# Patient Record
Sex: Male | Born: 1945 | Race: Black or African American | Hispanic: No | Smoking: Never smoker
Health system: Southern US, Community
[De-identification: ages and names within clinical notes are randomized; demographics above are authoritative.]

## PROBLEM LIST (undated history)

## (undated) DIAGNOSIS — D122 Benign neoplasm of ascending colon: Secondary | ICD-10-CM

## (undated) DIAGNOSIS — I10 Essential (primary) hypertension: Secondary | ICD-10-CM

## (undated) DIAGNOSIS — R32 Unspecified urinary incontinence: Secondary | ICD-10-CM

## (undated) DIAGNOSIS — E785 Hyperlipidemia, unspecified: Secondary | ICD-10-CM

## (undated) DIAGNOSIS — C801 Malignant (primary) neoplasm, unspecified: Secondary | ICD-10-CM

## (undated) DIAGNOSIS — D120 Benign neoplasm of cecum: Secondary | ICD-10-CM

## (undated) DIAGNOSIS — I2510 Atherosclerotic heart disease of native coronary artery without angina pectoris: Secondary | ICD-10-CM

## (undated) DIAGNOSIS — F0390 Unspecified dementia without behavioral disturbance: Secondary | ICD-10-CM

## (undated) HISTORY — DX: Unspecified urinary incontinence: R32

## (undated) HISTORY — DX: Hyperlipidemia, unspecified: E78.5

---

## 2014-02-20 ENCOUNTER — Emergency Department: Payer: Self-pay | Admitting: Internal Medicine

## 2015-11-06 ENCOUNTER — Encounter: Payer: Self-pay | Admitting: Unknown Physician Specialty

## 2017-03-22 DIAGNOSIS — J069 Acute upper respiratory infection, unspecified: Secondary | ICD-10-CM | POA: Diagnosis not present

## 2017-04-20 DIAGNOSIS — H1031 Unspecified acute conjunctivitis, right eye: Secondary | ICD-10-CM | POA: Diagnosis not present

## 2017-10-10 ENCOUNTER — Ambulatory Visit (INDEPENDENT_AMBULATORY_CARE_PROVIDER_SITE_OTHER): Payer: Medicare HMO | Admitting: Unknown Physician Specialty

## 2017-10-10 ENCOUNTER — Encounter: Payer: Self-pay | Admitting: Unknown Physician Specialty

## 2017-10-10 VITALS — BP 129/87 | HR 88 | Temp 98.5°F | Ht 68.8 in | Wt 146.4 lb

## 2017-10-10 DIAGNOSIS — I10 Essential (primary) hypertension: Secondary | ICD-10-CM

## 2017-10-10 DIAGNOSIS — Z23 Encounter for immunization: Secondary | ICD-10-CM | POA: Diagnosis not present

## 2017-10-10 DIAGNOSIS — H6123 Impacted cerumen, bilateral: Secondary | ICD-10-CM

## 2017-10-10 DIAGNOSIS — Z7689 Persons encountering health services in other specified circumstances: Secondary | ICD-10-CM | POA: Diagnosis not present

## 2017-10-10 LAB — UA/M W/RFLX CULTURE, ROUTINE
Bilirubin, UA: NEGATIVE
Glucose, UA: NEGATIVE
Ketones, UA: NEGATIVE
LEUKOCYTES UA: NEGATIVE
Nitrite, UA: NEGATIVE
PH UA: 5.5 (ref 5.0–7.5)
RBC UA: NEGATIVE
Specific Gravity, UA: >1.03 — ABNORMAL HIGH (ref 1.005–1.030)
Urobilinogen, Ur: 0.2 mg/dL (ref 0.2–1.0)

## 2017-10-10 LAB — MICROSCOPIC EXAMINATION: BACTERIA UA: NONE SEEN

## 2017-10-10 MED ORDER — AMLODIPINE BESYLATE 5 MG PO TABS: 5.0000 mg | ORAL_TABLET | Freq: Every day | ORAL | 3 refills | Status: DC

## 2017-10-10 NOTE — Progress Notes (Signed)
BP 129/87 (BP Location: Left Arm, Cuff Size: Normal)   Pulse 88   Temp 98.5 F (36.9 C) (Oral)   Ht 5' 8.8" (1.748 m)   Wt 146 lb 6.4 oz (66.4 kg)   SpO2 98%   BMI 21.75 kg/m    Subjective:    Patient ID: Ronnie Bullock, male    DOB: Feb 11, 1946, 71 y.o.   MRN: [id number]  HPI: Ronnie Bullock is a 71 y.o. male  Chief Complaint  Patient presents with  . Establish Care    pt states he is interested in Hep C order  . Ear Fullness    pt states his ears feel full, has not done any pre-treatment for impacted wax   Hypertension On no medications, though pt thinks he's had high BP for about 5 years.   Average home BPs: Not chekcing   No problems or lightheadedness No chest pain with exertion or shortness of breath No Edema Mother with hypertension  Depression screen [facility] 2/9 10/10/2017  Decreased Interest 0  Down, Depressed, Hopeless 0  PHQ - 2 Score 0  Altered sleeping 0  Tired, decreased energy 0  Change in appetite 0  Feeling bad or failure about yourself  0  Trouble concentrating 0  Moving slowly or fidgety/restless 0  Suicidal thoughts 0  PHQ-9 Score 0   Social History   Socioeconomic History  . Marital status: Widowed    Spouse name: Not on file  . Number of children: Not on file  . Years of education: Not on file  . Highest education level: Not on file  Social Needs  . Financial resource strain: Not on file  . Food insecurity - worry: Not on file  . Food insecurity - inability: Not on file  . Transportation needs - medical: Not on file  . Transportation needs - non-medical: Not on file  Occupational History  . Not on file  Tobacco Use  . Smoking status: Never Smoker  . Smokeless tobacco: Never Used  Substance and Sexual Activity  . Alcohol use: No    Alcohol/week: 0.0 oz    Frequency: Never  . Drug use: No  . Sexual activity: Yes  Other Topics Concern  . Not on file  Social History Narrative  . Not on file   Family History  Problem  Relation Age of Onset  . Hypertension Mother    History reviewed. No pertinent past medical history.  History reviewed. No pertinent surgical history.    Relevant past medical, surgical, family and social history reviewed and updated as indicated. Interim medical history since our last visit reviewed. Allergies and medications reviewed and updated.  Review of Systems  Constitutional: Negative.   HENT:       "ears are stopped up"  Eyes: Negative.   Respiratory: Negative.   Cardiovascular: Negative.   Gastrointestinal: Negative.   Endocrine: Negative.   Genitourinary: Negative.   Musculoskeletal: Negative.   Allergic/Immunologic: Negative.   Neurological: Negative.   Psychiatric/Behavioral: Negative.     Per HPI unless specifically indicated above     Objective:    BP 129/87 (BP Location: Left Arm, Cuff Size: Normal)   Pulse 88   Temp 98.5 F (36.9 C) (Oral)   Ht 5' 8.8" (1.748 m)   Wt 146 lb 6.4 oz (66.4 kg)   SpO2 98%   BMI 21.75 kg/m   Wt Readings from Last 3 Encounters:  10/10/17 146 lb 6.4 oz (66.4 kg)  04/29/14 147 lb (  66.7 kg)    Physical Exam  Constitutional: He is oriented to person, place, and time. He appears well-developed and well-nourished. No distress.  HENT:  Head: Normocephalic and atraumatic.  Bilateral ears impacted with ear wax.  Successfully irrigated right ear.  Left ear unsuccessful.  Pt ed on OTC ear wax removal  Eyes: Conjunctivae and lids are normal. Right eye exhibits no discharge. Left eye exhibits no discharge. No scleral icterus.  Neck: Normal range of motion. Neck supple. No JVD present. Carotid bruit is not present.  Cardiovascular: Normal rate, regular rhythm and normal heart sounds.  Pulmonary/Chest: Effort normal and breath sounds normal. No respiratory distress.  Abdominal: Normal appearance. There is no splenomegaly or hepatomegaly.  Musculoskeletal: Normal range of motion.  Neurological: He is alert and oriented to person,  place, and time.  Skin: Skin is warm, dry and intact. No rash noted. No pallor.  Psychiatric: He has a normal mood and affect. His behavior is normal. Judgment and thought content normal.    No results found for this or any previous visit.    Assessment & Plan:   Problem List Items Addressed This Visit      Unprioritized   Encounter to establish care   Essential hypertension, benign    High today and pt shares it's been high for some time.  Will get labs and start Amlodipine 5 mg.  Recheck in 1 month      Relevant Medications   amLODipine (NORVASC) 5 MG tablet   Other Relevant Orders   Comprehensive metabolic panel   Lipid Panel w/o Chol/HDL Ratio   TSH   UA/M w/rflx Culture, Routine    Other Visit Diagnoses    Need for influenza vaccination    -  Primary   Relevant Orders   Flu vaccine HIGH DOSE PF (Completed)   Need for pneumococcal vaccination       Relevant Orders   Pneumococcal conjugate vaccine 13-valent IM (Completed)   Bilateral impacted cerumen       See above.  Pt ed on OTC ear wax removal       Follow up plan: Return in about 4 weeks (around 11/07/2017) for plus needs Medicare wellness.

## 2017-10-10 NOTE — Assessment & Plan Note (Signed)
High today and pt shares it's been high for some time.  Will get labs and start Amlodipine 5 mg.  Recheck in 1 month

## 2017-10-10 NOTE — Patient Instructions (Addendum)
Influenza (Flu) Vaccine (Inactivated or Recombinant): What You Need to Know 1. Why get vaccinated? Influenza ("flu") is a contagious disease that spreads around the [place] every year, usually between October and May. Flu is caused by influenza viruses, and is spread mainly by coughing, sneezing, and close contact. Anyone can get flu. Flu strikes suddenly and can last several days. Symptoms vary by age, but can include:  fever/chills  sore throat  muscle aches  fatigue  cough  headache  runny or stuffy nose  Flu can also lead to pneumonia and blood infections, and cause diarrhea and seizures in children. If you have a medical condition, such as heart or lung disease, flu can make it worse. Flu is more dangerous for some people. Infants and young children, people 23 years of age and older, pregnant women, and people with certain health conditions or a weakened immune system are at greatest risk. Each year thousands of people in the [place] die from flu, and many more are hospitalized. Flu vaccine can:  keep you from getting flu,  make flu less severe if you do get it, and  keep you from spreading flu to your family and other people. 2. Inactivated and recombinant flu vaccines A dose of flu vaccine is recommended every flu season. Children 6 months through 91 years of age may need two doses during the same flu season. Everyone else needs only one dose each flu season. Some inactivated flu vaccines contain a very small amount of a mercury-based preservative called thimerosal. Studies have not shown thimerosal in vaccines to be harmful, but flu vaccines that do not contain thimerosal are available. There is no live flu virus in flu shots. They cannot cause the flu. There are many flu viruses, and they are always changing. Each year a new flu vaccine is made to protect against three or four viruses that are likely to cause disease in the upcoming flu season. But even when the  vaccine doesn't exactly match these viruses, it may still provide some protection. Flu vaccine cannot prevent:  flu that is caused by a virus not covered by the vaccine, or  illnesses that look like flu but are not.  It takes about 2 weeks for protection to develop after vaccination, and protection lasts through the flu season. 3. Some people should not get this vaccine Tell the person who is giving you the vaccine:  If you have any severe, life-threatening allergies. If you ever had a life-threatening allergic reaction after a dose of flu vaccine, or have a severe allergy to any part of this vaccine, you may be advised not to get vaccinated. Most, but not all, types of flu vaccine contain a small amount of egg protein.  If you ever had Guillain-Barr Syndrome (also called GBS). Some people with a history of GBS should not get this vaccine. This should be discussed with your doctor.  If you are not feeling well. It is usually okay to get flu vaccine when you have a mild illness, but you might be asked to come back when you feel better.  4. Risks of a vaccine reaction With any medicine, including vaccines, there is a chance of reactions. These are usually mild and go away on their own, but serious reactions are also possible. Most people who get a flu shot do not have any problems with it. Minor problems following a flu shot include:  soreness, redness, or swelling where the shot was given  hoarseness  sore,  red or itchy eyes  cough  fever  aches  headache  itching  fatigue  If these problems occur, they usually begin soon after the shot and last 1 or 2 days. More serious problems following a flu shot can include the following:  There may be a small increased risk of Guillain-Barre Syndrome (GBS) after inactivated flu vaccine. This risk has been estimated at 1 or 2 additional cases per million people vaccinated. This is much lower than the risk of severe complications from  flu, which can be prevented by flu vaccine.  Young children who get the flu shot along with pneumococcal vaccine (PCV13) and/or DTaP vaccine at the same time might be slightly more likely to have a seizure caused by fever. Ask your doctor for more information. Tell your doctor if a child who is getting flu vaccine has ever had a seizure.  Problems that could happen after any injected vaccine:  People sometimes faint after a medical procedure, including vaccination. Sitting or lying down for about 15 minutes can help prevent fainting, and injuries caused by a fall. Tell your doctor if you feel dizzy, or have vision changes or ringing in the ears.  Some people get severe pain in the shoulder and have difficulty moving the arm where a shot was given. This happens very rarely.  Any medication can cause a severe allergic reaction. Such reactions from a vaccine are very rare, estimated at about 1 in a million doses, and would happen within a few minutes to a few hours after the vaccination. As with any medicine, there is a very remote chance of a vaccine causing a serious injury or death. The safety of vaccines is always being monitored. For more information, visit: http://www.aguilar.org/ 5. What if there is a serious reaction? What should I look for? Look for anything that concerns you, such as signs of a severe allergic reaction, very high fever, or unusual behavior. Signs of a severe allergic reaction can include hives, swelling of the face and throat, difficulty breathing, a fast heartbeat, dizziness, and weakness. These would start a few minutes to a few hours after the vaccination. What should I do?  If you think it is a severe allergic reaction or other emergency that can't wait, call 9-1-1 and get the person to the nearest hospital. Otherwise, call your doctor.  Reactions should be reported to the Vaccine Adverse Event Reporting System (VAERS). Your doctor should file this report, or you  can do it yourself through the VAERS web site at www.vaers.SamedayNews.es, or by calling 6094730752. ? VAERS does not give medical advice. 6. The National Vaccine Injury Compensation Program The [employer] Vaccine Injury Compensation Program (VICP) is a federal program that was created to compensate people who may have been injured by certain vaccines. Persons who believe they may have been injured by a vaccine can learn about the program and about filing a claim by calling 458-267-6070 or visiting the [place] website at GoldCloset.com.ee. There is a time limit to file a claim for compensation. 7. How can I learn more?  Ask your healthcare provider. He or she can give you the vaccine package insert or suggest other sources of information.  Call your local or state health department.  Contact the Centers for Disease Control and Prevention (CDC): ? Call (540)164-9661 (1-800-CDC-INFO) or ? Visit CDC's website at https://gibson.com/ Vaccine Information Statement, Inactivated Influenza Vaccine (06/13/2014) This information is not intended to replace advice given to you by your health care provider. Make sure  you discuss any questions you have with your health care provider. Document Released: 08/18/2006 Document Revised: 07/14/2016 Document Reviewed: 07/14/2016 Elsevier Interactive Patient Education  2017 Elsevier Inc. Pneumococcal Conjugate Vaccine (PCV13) What You Need to Know 1. Why get vaccinated? Vaccination can protect both children and adults from pneumococcal disease. Pneumococcal disease is caused by bacteria that can spread from person to person through close contact. It can cause ear infections, and it can also lead to more serious infections of the:  Lungs (pneumonia),  Blood (bacteremia), and  Covering of the brain and spinal cord (meningitis).  Pneumococcal pneumonia is most common among adults. Pneumococcal meningitis can cause deafness and brain damage, and it kills  about 1 child in 10 who get it. Anyone can get pneumococcal disease, but children under 74 years of age and adults 55 years and older, people with certain medical conditions, and cigarette smokers are at the highest risk. Before there was a vaccine, the [place] saw:  more than 700 cases of meningitis,  about 13,000 blood infections,  about 5 million ear infections, and  about 200 deaths  in children under 5 each year from pneumococcal disease. Since vaccine became available, severe pneumococcal disease in these children has fallen by 88%. About 18,000 older adults die of pneumococcal disease each year in the [place]. Treatment of pneumococcal infections with penicillin and other drugs is not as effective as it used to be, because some strains of the disease have become resistant to these drugs. This makes prevention of the disease, through vaccination, even more important. 2. PCV13 vaccine Pneumococcal conjugate vaccine (called PCV13) protects against 13 types of pneumococcal bacteria. PCV13 is routinely given to children at 2, 4, 6, and 19-43 months of age. It is also recommended for children and adults 18 to 57 years of age with certain health conditions, and for all adults 36 years of age and older. Your doctor can give you details. 3. Some people should not get this vaccine Anyone who has ever had a life-threatening allergic reaction to a dose of this vaccine, to an earlier pneumococcal vaccine called PCV7, or to any vaccine containing diphtheria toxoid (for example, DTaP), should not get PCV13. Anyone with a severe allergy to any component of PCV13 should not get the vaccine. Tell your doctor if the person being vaccinated has any severe allergies. If the person scheduled for vaccination is not feeling well, your healthcare provider might decide to reschedule the shot on another day. 4. Risks of a vaccine reaction With any medicine, including vaccines, there is a chance of  reactions. These are usually mild and go away on their own, but serious reactions are also possible. Problems reported following PCV13 varied by age and dose in the series. The most common problems reported among children were:  About half became drowsy after the shot, had a temporary loss of appetite, or had redness or tenderness where the shot was given.  About 1 out of 3 had swelling where the shot was given.  About 1 out of 3 had a mild fever, and about 1 in 20 had a fever over 102.70F.  Up to about 8 out of 10 became fussy or irritable.  Adults have reported pain, redness, and swelling where the shot was given; also mild fever, fatigue, headache, chills, or muscle pain. Young children who get PCV13 along with inactivated flu vaccine at the same time may be at increased risk for seizures caused by fever. Ask your doctor for more  information. Problems that could happen after any vaccine:  People sometimes faint after a medical procedure, including vaccination. Sitting or lying down for about 15 minutes can help prevent fainting, and injuries caused by a fall. Tell your doctor if you feel dizzy, or have vision changes or ringing in the ears.  Some older children and adults get severe pain in the shoulder and have difficulty moving the arm where a shot was given. This happens very rarely.  Any medication can cause a severe allergic reaction. Such reactions from a vaccine are very rare, estimated at about 1 in a million doses, and would happen within a few minutes to a few hours after the vaccination. As with any medicine, there is a very small chance of a vaccine causing a serious injury or death. The safety of vaccines is always being monitored. For more information, visit: http://www.aguilar.org/ 5. What if there is a serious reaction? What should I look for? Look for anything that concerns you, such as signs of a severe allergic reaction, very high fever, or unusual behavior. Signs of  a severe allergic reaction can include hives, swelling of the face and throat, difficulty breathing, a fast heartbeat, dizziness, and weakness-usually within a few minutes to a few hours after the vaccination. What should I do?  If you think it is a severe allergic reaction or other emergency that can't wait, call 9-1-1 or get the person to the nearest hospital. Otherwise, call your doctor.  Reactions should be reported to the Vaccine Adverse Event Reporting System (VAERS). Your doctor should file this report, or you can do it yourself through the VAERS web site at www.vaers.SamedayNews.es, or by calling 431-039-1551. ? VAERS does not give medical advice. 6. The National Vaccine Injury Compensation Program The [employer] Vaccine Injury Compensation Program (VICP) is a federal program that was created to compensate people who may have been injured by certain vaccines. Persons who believe they may have been injured by a vaccine can learn about the program and about filing a claim by calling [phone] or visiting the [place] website at GoldCloset.com.ee. There is a time limit to file a claim for compensation. 7. How can I learn more?  Ask your healthcare provider. He or she can give you the vaccine package insert or suggest other sources of information.  Call your local or state health department.  Contact the Centers for Disease Control and Prevention (CDC): ? Call 332-287-8658 (1-800-CDC-INFO) or ? Visit CDC's website at http://hunter.com/ Vaccine Information Statement, PCV13 Vaccine (09/11/2014) This information is not intended to replace advice given to you by your health care provider. Make sure you discuss any questions you have with your health care provider. Document Released: 08/21/2006 Document Revised: 07/14/2016 Document Reviewed: 07/14/2016 Elsevier Interactive Patient Education  2017 [place] DASH stands for "Dietary Approaches to Stop  Hypertension." The DASH eating plan is a healthy eating plan that has been shown to reduce high blood pressure (hypertension). It may also reduce your risk for type 2 diabetes, heart disease, and stroke. The DASH eating plan may also help with weight loss. What are tips for following this plan? General guidelines  Avoid eating more than 2,300 mg (milligrams) of salt (sodium) a day. If you have hypertension, you may need to reduce your sodium intake to 1,500 mg a day.  Limit alcohol intake to no more than 1 drink a day for nonpregnant women and 2 drinks a day for men. One drink equals 12 oz of  beer, 5 oz of wine, or 1 oz of hard liquor.  Work with your health care provider to maintain a healthy body weight or to lose weight. Ask what an ideal weight is for you.  Get at least 30 minutes of exercise that causes your heart to beat faster (aerobic exercise) most days of the week. Activities may include walking, swimming, or biking.  Work with your health care provider or diet and nutrition specialist (dietitian) to adjust your eating plan to your individual calorie needs. Reading food labels  Check food labels for the amount of sodium per serving. Choose foods with less than 5 percent of the Daily Value of sodium. Generally, foods with less than 300 mg of sodium per serving fit into this eating plan.  To find whole grains, look for the word "whole" as the first word in the ingredient list. Shopping  Buy products labeled as "low-sodium" or "no salt added."  Buy fresh foods. Avoid canned foods and premade or frozen meals. Cooking  Avoid adding salt when cooking. Use salt-free seasonings or herbs instead of table salt or sea salt. Check with your health care provider or pharmacist before using salt substitutes.  Do not fry foods. Cook foods using healthy methods such as baking, boiling, grilling, and broiling instead.  Cook with heart-healthy oils, such as olive, canola, soybean, or sunflower  oil. Meal planning   Eat a balanced diet that includes: ? 5 or more servings of fruits and vegetables each day. At each meal, try to fill half of your plate with fruits and vegetables. ? Up to 6-8 servings of whole grains each day. ? Less than 6 oz of lean meat, poultry, or fish each day. A 3-oz serving of meat is about the same size as a deck of cards. One egg equals 1 oz. ? 2 servings of low-fat dairy each day. ? A serving of nuts, seeds, or beans 5 times each week. ? Heart-healthy fats. Healthy fats called Omega-3 fatty acids are found in foods such as flaxseeds and coldwater fish, like sardines, salmon, and mackerel.  Limit how much you eat of the following: ? Canned or prepackaged foods. ? Food that is high in trans fat, such as fried foods. ? Food that is high in saturated fat, such as fatty meat. ? Sweets, desserts, sugary drinks, and other foods with added sugar. ? Full-fat dairy products.  Do not salt foods before eating.  Try to eat at least 2 vegetarian meals each week.  Eat more home-cooked food and less restaurant, buffet, and fast food.  When eating at a restaurant, ask that your food be prepared with less salt or no salt, if possible. What foods are recommended? The items listed may not be a complete list. Talk with your dietitian about what dietary choices are best for you. Grains Whole-grain or whole-wheat bread. Whole-grain or whole-wheat pasta. Brown rice. [place]. Bulgur. Whole-grain and low-sodium cereals. Pita bread. Low-fat, low-sodium crackers. Whole-wheat flour tortillas. Vegetables Fresh or frozen vegetables (raw, steamed, roasted, or grilled). Low-sodium or reduced-sodium tomato and vegetable juice. Low-sodium or reduced-sodium tomato sauce and tomato paste. Low-sodium or reduced-sodium canned vegetables. Fruits All fresh, dried, or frozen fruit. Canned fruit in natural juice (without added sugar). Meat and other protein foods Skinless chicken or  [place]. Ground chicken or [place]. Pork with fat trimmed off. Fish and seafood. Egg whites. Dried beans, peas, or lentils. Unsalted nuts, nut butters, and seeds. Unsalted canned beans. Lean cuts of beef with fat  trimmed off. Low-sodium, lean deli meat. Dairy Low-fat (1%) or fat-free (skim) milk. Fat-free, low-fat, or reduced-fat cheeses. Nonfat, low-sodium ricotta or cottage cheese. Low-fat or nonfat yogurt. Low-fat, low-sodium cheese. Fats and oils Soft margarine without trans fats. Vegetable oil. Low-fat, reduced-fat, or light mayonnaise and salad dressings (reduced-sodium). Canola, safflower, olive, soybean, and sunflower oils. Avocado. Seasoning and other foods Herbs. Spices. Seasoning mixes without salt. Unsalted popcorn and pretzels. Fat-free sweets. What foods are not recommended? The items listed may not be a complete list. Talk with your dietitian about what dietary choices are best for you. Grains Baked goods made with fat, such as croissants, muffins, or some breads. Dry pasta or rice meal packs. Vegetables Creamed or fried vegetables. Vegetables in a cheese sauce. Regular canned vegetables (not low-sodium or reduced-sodium). Regular canned tomato sauce and paste (not low-sodium or reduced-sodium). Regular tomato and vegetable juice (not low-sodium or reduced-sodium). Angie Fava. Olives. Fruits Canned fruit in a light or heavy syrup. Fried fruit. Fruit in cream or butter sauce. Meat and other protein foods Fatty cuts of meat. Ribs. Fried meat. Berniece Salines. Sausage. Bologna and other processed lunch meats. Salami. Fatback. Hotdogs. Bratwurst. Salted nuts and seeds. Canned beans with added salt. Canned or smoked fish. Whole eggs or egg yolks. Chicken or [place] with skin. Dairy Whole or 2% milk, cream, and half-and-half. Whole or full-fat cream cheese. Whole-fat or sweetened yogurt. Full-fat cheese. Nondairy creamers. Whipped toppings. Processed cheese and cheese spreads. Fats and oils Butter.  Stick margarine. Lard. Shortening. Ghee. Bacon fat. Tropical oils, such as coconut, palm kernel, or palm oil. Seasoning and other foods Salted popcorn and pretzels. Onion salt, garlic salt, seasoned salt, table salt, and sea salt. Worcestershire sauce. Tartar sauce. Barbecue sauce. Teriyaki sauce. Soy sauce, including reduced-sodium. Steak sauce. Canned and packaged gravies. Fish sauce. Oyster sauce. Cocktail sauce. Horseradish that you find on the shelf. Ketchup. Mustard. Meat flavorings and tenderizers. Bouillon cubes. Hot sauce and Tabasco sauce. Premade or packaged marinades. Premade or packaged taco seasonings. Relishes. Regular salad dressings. Where to find more information:  National Heart, Lung, and [place]: https://wilson-eaton.com/  American Heart Association: www.heart.org Summary  The DASH eating plan is a healthy eating plan that has been shown to reduce high blood pressure (hypertension). It may also reduce your risk for type 2 diabetes, heart disease, and stroke.  With the DASH eating plan, you should limit salt (sodium) intake to 2,300 mg a day. If you have hypertension, you may need to reduce your sodium intake to 1,500 mg a day.  When on the DASH eating plan, aim to eat more fresh fruits and vegetables, whole grains, lean proteins, low-fat dairy, and heart-healthy fats.  Work with your health care provider or diet and nutrition specialist (dietitian) to adjust your eating plan to your individual calorie needs. This information is not intended to replace advice given to you by your health care provider. Make sure you discuss any questions you have with your health care provider. Document Released: 10/13/2011 Document Revised: 10/17/2016 Document Reviewed: 10/17/2016 Elsevier Interactive Patient Education  2017 Elsevier Inc.  Hypertension Hypertension is another name for high blood pressure. High blood pressure forces your heart to work harder to pump blood. This can cause  problems over time. There are two numbers in a blood pressure reading. There is a top number (systolic) over a bottom number (diastolic). It is best to have a blood pressure below 120/80. Healthy choices can help lower your blood pressure. You may need medicine to help  lower your blood pressure if:  Your blood pressure cannot be lowered with healthy choices.  Your blood pressure is higher than 130/80.  Follow these instructions at home: Eating and drinking  If directed, follow the DASH eating plan. This diet includes: ? Filling half of your plate at each meal with fruits and vegetables. ? Filling one quarter of your plate at each meal with whole grains. Whole grains include whole wheat pasta, brown rice, and whole grain bread. ? Eating or drinking low-fat dairy products, such as skim milk or low-fat yogurt. ? Filling one quarter of your plate at each meal with low-fat (lean) proteins. Low-fat proteins include fish, skinless chicken, eggs, beans, and tofu. ? Avoiding fatty meat, cured and processed meat, or chicken with skin. ? Avoiding premade or processed food.  Eat less than 1,500 mg of salt (sodium) a day.  Limit alcohol use to no more than 1 drink a day for nonpregnant women and 2 drinks a day for men. One drink equals 12 oz of beer, 5 oz of wine, or 1 oz of hard liquor. Lifestyle  Work with your doctor to stay at a healthy weight or to lose weight. Ask your doctor what the best weight is for you.  Get at least 30 minutes of exercise that causes your heart to beat faster (aerobic exercise) most days of the week. This may include walking, swimming, or biking.  Get at least 30 minutes of exercise that strengthens your muscles (resistance exercise) at least 3 days a week. This may include lifting weights or pilates.  Do not use any products that contain nicotine or tobacco. This includes cigarettes and e-cigarettes. If you need help quitting, ask your doctor.  Check your blood  pressure at home as told by your doctor.  Keep all follow-up visits as told by your doctor. This is important. Medicines  Take over-the-counter and prescription medicines only as told by your doctor. Follow directions carefully.  Do not skip doses of blood pressure medicine. The medicine does not work as well if you skip doses. Skipping doses also puts you at risk for problems.  Ask your doctor about side effects or reactions to medicines that you should watch for. Contact a doctor if:  You think you are having a reaction to the medicine you are taking.  You have headaches that keep coming back (recurring).  You feel dizzy.  You have swelling in your ankles.  You have trouble with your vision. Get help right away if:  You get a very bad headache.  You start to feel confused.  You feel weak or numb.  You feel faint.  You get very bad pain in your: ? Chest. ? Belly (abdomen).  You throw up (vomit) more than once.  You have trouble breathing. Summary  Hypertension is another name for high blood pressure.  Making healthy choices can help lower blood pressure. If your blood pressure cannot be controlled with healthy choices, you may need to take medicine. This information is not intended to replace advice given to you by your health care provider. Make sure you discuss any questions you have with your health care provider. --------------------------------------------

## 2017-10-11 ENCOUNTER — Encounter: Payer: Self-pay | Admitting: Unknown Physician Specialty

## 2017-10-11 LAB — LIPID PANEL W/O CHOL/HDL RATIO
Cholesterol, Total: 183 mg/dL (ref 100–199)
HDL: 71 mg/dL (ref >39)
LDL Calculated: 101 mg/dL — ABNORMAL HIGH (ref 0–99)
Triglycerides: 54 mg/dL (ref 0–149)
VLDL Cholesterol Cal: 11 mg/dL (ref 5–40)

## 2017-10-11 LAB — COMPREHENSIVE METABOLIC PANEL
A/G RATIO: 1.1 — AB (ref 1.2–2.2)
ALBUMIN: 3.6 g/dL (ref 3.5–4.8)
ALT: 8 IU/L (ref 0–44)
AST: 21 IU/L (ref 0–40)
Alkaline Phosphatase: 78 IU/L (ref 39–117)
BILIRUBIN TOTAL: 0.4 mg/dL (ref 0.0–1.2)
BUN / CREAT RATIO: 14 (ref 10–24)
BUN: 16 mg/dL (ref 8–27)
CALCIUM: 9.2 mg/dL (ref 8.6–10.2)
CHLORIDE: 105 mmol/L (ref 96–106)
CO2: 25 mmol/L (ref 20–29)
Creatinine, Ser: 1.15 mg/dL (ref 0.76–1.27)
GFR, EST AFRICAN AMERICAN: 73 mL/min/{1.73_m2} (ref >59)
GFR, EST NON AFRICAN AMERICAN: 63 mL/min/{1.73_m2} (ref >59)
GLOBULIN, TOTAL: 3.2 g/dL (ref 1.5–4.5)
Glucose: 98 mg/dL (ref 65–99)
POTASSIUM: 4.5 mmol/L (ref 3.5–5.2)
Sodium: 142 mmol/L (ref 134–144)
TOTAL PROTEIN: 6.8 g/dL (ref 6.0–8.5)

## 2017-10-11 LAB — TSH: TSH: 0.846 u[IU]/mL (ref 0.450–4.500)

## 2017-10-24 ENCOUNTER — Telehealth: Payer: Self-pay | Admitting: Unknown Physician Specialty

## 2017-10-24 NOTE — Telephone Encounter (Signed)
Routing to provider  

## 2017-10-24 NOTE — Telephone Encounter (Signed)
This is our nice new pt.  Please find out what this is about.  I did not put in a guardianship referral and not sure how we can help

## 2017-10-24 NOTE — Telephone Encounter (Signed)
Called and left Ronnie Bullock a VM asking for her to please return my call. Please route phone call to practice if Ronnie Bullock calls back so I can speak with her.

## 2017-10-24 NOTE — Telephone Encounter (Signed)
Copied from [place]. Topic: Referral - Status >> Oct 24, 2017 10:47 AM Cleaster Corin, NT wrote: CRM for notification. See Telephone encounter for:   10/24/17.  Reason for CRM: Olena Mater. From social services [phone] has questions cornering guardianship referral for pt. Would like to speak with Dr. Jeananne Rama or nurse when available.

## 2017-10-24 NOTE — Telephone Encounter (Signed)
Call for [facility]

## 2017-10-24 NOTE — Telephone Encounter (Signed)
Routing to [place] [facility] [place]
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
## 2017-10-25 NOTE — Telephone Encounter (Signed)
I've met him once and don't know if I can make that decision on one brief visit.

## 2017-10-25 NOTE — Telephone Encounter (Signed)
Called and left Ronnie Bullock a VM letting her know what Malachy Mood said. Asked for her to call back with any further questions or concerns.

## 2017-10-26 ENCOUNTER — Ambulatory Visit: Payer: Medicare HMO

## 2017-11-10 ENCOUNTER — Ambulatory Visit: Payer: Medicare HMO | Admitting: Unknown Physician Specialty

## 2017-11-10 ENCOUNTER — Encounter: Payer: Self-pay | Admitting: Unknown Physician Specialty

## 2017-11-10 ENCOUNTER — Ambulatory Visit (INDEPENDENT_AMBULATORY_CARE_PROVIDER_SITE_OTHER): Payer: Medicare HMO

## 2017-11-10 VITALS — BP 131/70 | HR 92 | Temp 98.1°F | Resp 16 | Ht 68.0 in | Wt 151.0 lb

## 2017-11-10 VITALS — BP 131/70 | HR 92 | Temp 98.1°F | Wt 151.6 lb

## 2017-11-10 DIAGNOSIS — I10 Essential (primary) hypertension: Secondary | ICD-10-CM

## 2017-11-10 DIAGNOSIS — Z1211 Encounter for screening for malignant neoplasm of colon: Secondary | ICD-10-CM | POA: Diagnosis not present

## 2017-11-10 DIAGNOSIS — Z0000 Encounter for general adult medical examination without abnormal findings: Secondary | ICD-10-CM | POA: Diagnosis not present

## 2017-11-10 DIAGNOSIS — Z7189 Other specified counseling: Secondary | ICD-10-CM

## 2017-11-10 NOTE — Progress Notes (Signed)
BP 131/70   Pulse 92   Temp 98.1 F (36.7 C) (Oral)   Wt 151 lb 9.6 oz (68.8 kg)   SpO2 96%   BMI 22.52 kg/m    Subjective:    Patient ID: Ronnie Bullock, male    DOB: Feb 21, 1946, 72 y.o.   MRN: [id number]  HPI: Ronnie Bullock is a 72 y.o. male  Chief Complaint  Patient presents with  . Hypertension    4 week f/up   Hypertension Using medications without difficulty Average home BPs Not checking   No problems or lightheadedness No chest pain with exertion or shortness of breath No Edema   Relevant past medical, surgical, family and social history reviewed and updated as indicated. Interim medical history since our last visit reviewed. Allergies and medications reviewed and updated.  Review of Systems  Constitutional: Negative.   HENT: Negative.   Eyes: Negative.   Respiratory: Negative.   Cardiovascular: Negative.   Gastrointestinal: Negative.   Endocrine: Negative.   Genitourinary: Negative.   Skin: Negative.   Allergic/Immunologic: Negative.   Neurological: Negative.   Hematological: Negative.   Psychiatric/Behavioral: Negative.     Per HPI unless specifically indicated above     Objective:    BP 131/70   Pulse 92   Temp 98.1 F (36.7 C) (Oral)   Wt 151 lb 9.6 oz (68.8 kg)   SpO2 96%   BMI 22.52 kg/m   Wt Readings from Last 3 Encounters:  11/10/17 151 lb 9.6 oz (68.8 kg)  10/10/17 146 lb 6.4 oz (66.4 kg)  04/29/14 147 lb (66.7 kg)    Physical Exam  Constitutional: He is oriented to person, place, and time. He appears well-developed and well-nourished. No distress.  HENT:  Head: Normocephalic and atraumatic.  Eyes: Conjunctivae and lids are normal. Right eye exhibits no discharge. Left eye exhibits no discharge. No scleral icterus.  Neck: Normal range of motion. Neck supple. No JVD present. Carotid bruit is not present.  Cardiovascular: Normal rate, regular rhythm and normal heart sounds.  Pulmonary/Chest: Effort normal and breath sounds  normal. No respiratory distress.  Abdominal: Normal appearance. There is no splenomegaly or hepatomegaly.  Musculoskeletal: Normal range of motion.  Neurological: He is alert and oriented to person, place, and time.  Skin: Skin is warm, dry and intact. No rash noted. No pallor.  Psychiatric: He has a normal mood and affect. His behavior is normal. Judgment and thought content normal.    Results for orders placed or performed in visit on 10/10/17  Microscopic Examination  Result Value Ref Range   WBC, UA 0-5 0 - 5 /hpf   RBC, UA 0-2 0 - 2 /hpf   Epithelial Cells (non renal) CANCELED    Bacteria, UA None seen None seen/Few  Comprehensive metabolic panel  Result Value Ref Range   Glucose 98 65 - 99 mg/dL   BUN 16 8 - 27 mg/dL   Creatinine, Ser 1.15 0.76 - 1.27 mg/dL   GFR calc non Af Amer 63 >59 mL/min/1.73   GFR calc Af Amer 73 >59 mL/min/1.73   BUN/Creatinine Ratio 14 10 - 24   Sodium 142 134 - 144 mmol/L   Potassium 4.5 3.5 - 5.2 mmol/L   Chloride 105 96 - 106 mmol/L   CO2 25 20 - 29 mmol/L   Calcium 9.2 8.6 - 10.2 mg/dL   Total Protein 6.8 6.0 - 8.5 g/dL   Albumin 3.6 3.5 - 4.8 g/dL   Globulin, Total 3.2  1.5 - 4.5 g/dL   Albumin/Globulin Ratio 1.1 (L) 1.2 - 2.2   Bilirubin Total 0.4 0.0 - 1.2 mg/dL   Alkaline Phosphatase 78 39 - 117 IU/L   AST 21 0 - 40 IU/L   ALT 8 0 - 44 IU/L  Lipid Panel w/o Chol/HDL Ratio  Result Value Ref Range   Cholesterol, Total 183 100 - 199 mg/dL   Triglycerides 54 0 - 149 mg/dL   HDL 71 >39 mg/dL   VLDL Cholesterol Cal 11 5 - 40 mg/dL   LDL Calculated 101 (H) 0 - 99 mg/dL  TSH  Result Value Ref Range   TSH 0.846 0.450 - 4.500 uIU/mL  UA/M w/rflx Culture, Routine  Result Value Ref Range   Specific Gravity, UA >1.030 (H) 1.005 - 1.030   pH, UA 5.5 5.0 - 7.5   Color, UA Yellow Yellow   Appearance Ur Clear Clear   Leukocytes, UA Negative Negative   Protein, UA 2+ (A) Negative/Trace   Glucose, UA Negative Negative   Ketones, UA Negative  Negative   RBC, UA Negative Negative   Bilirubin, UA Negative Negative   Urobilinogen, Ur 0.2 0.2 - 1.0 mg/dL   Nitrite, UA Negative Negative   Microscopic Examination See below:       Assessment & Plan:   Problem List Items Addressed This Visit      Unprioritized   Advanced care planning/counseling discussion    A voluntary discussion about advance care planning including the explanation and discussion of advance directives was extensively discussed  with the patient.  Explanation about the health care proxy and Living will was reviewed and packet with forms with explanation of how to fill them out was given.  During this discussion, the patient was able to identify a health care proxy as his supervisor at work.  Patient was offered a separate [place] visit for further assistance with forms.         Essential hypertension, benign    Stable, continue present medications.         Other Visit Diagnoses    Benign hypertension    -  Primary       Follow up plan: Return in about 6 months (around 05/10/2018).

## 2017-11-10 NOTE — Assessment & Plan Note (Signed)
Stable, continue present medications.   

## 2017-11-10 NOTE — Assessment & Plan Note (Signed)
A voluntary discussion about advance care planning including the explanation and discussion of advance directives was extensively discussed  with the patient.  Explanation about the health care proxy and Living will was reviewed and packet with forms with explanation of how to fill them out was given.  During this discussion, the patient was able to identify a health care proxy as his supervisor at work.  Patient was offered a separate [place] visit for further assistance with forms.

## 2017-11-10 NOTE — Patient Instructions (Signed)
Mr. Edler , Thank you for taking time to come for your Medicare Wellness Visit. I appreciate your ongoing commitment to your health goals. Please review the following plan we discussed and let me know if I can assist you in the future.   Screening recommendations/referrals: Colonoscopy: referral sent today Recommended yearly ophthalmology/optometry visit for glaucoma screening and checkup Recommended yearly dental visit for hygiene and checkup  Vaccinations: Influenza vaccine: up to date Pneumococcal vaccine: up to date Tdap vaccine: due, check with your insurance for coverage Shingles vaccine: due    Advanced directives: Advance directive discussed with you today. I have provided a copy for you to complete at home and have notarized. Once this is complete please bring a copy in to our office so we can scan it into your chart.  Please call to set up an appointment to discuss health care power of attorney and fill out paperwork as we discussed at todays visit   Conditions/risks identified: none  Next appointment: Follow up in one year for your annual wellness exam   Preventive Care 65 Years and Older, Male Preventive care refers to lifestyle choices and visits with your health care provider that can promote health and wellness. What does preventive care include?  A yearly physical exam. This is also called an annual well check.  Dental exams once or twice a year.  Routine eye exams. Ask your health care provider how often you should have your eyes checked.  Personal lifestyle choices, including:  Daily care of your teeth and gums.  Regular physical activity.  Eating a healthy diet.  Avoiding tobacco and drug use.  Limiting alcohol use.  Practicing safe sex.  Taking low doses of aspirin every day.  Taking vitamin and mineral supplements as recommended by your health care provider. What happens during an annual well check? The services and screenings done by your  health care provider during your annual well check will depend on your age, overall health, lifestyle risk factors, and family history of disease. Counseling  Your health care provider may ask you questions about your:  Alcohol use.  Tobacco use.  Drug use.  Emotional well-being.  Home and relationship well-being.  Sexual activity.  Eating habits.  History of falls.  Memory and ability to understand (cognition).  Work and work Statistician. Screening  You may have the following tests or measurements:  Height, weight, and BMI.  Blood pressure.  Lipid and cholesterol levels. These may be checked every 5 years, or more frequently if you are over 48 years old.  Skin check.  Lung cancer screening. You may have this screening every year starting at age 91 if you have a 30-pack-year history of smoking and currently smoke or have quit within the past 15 years.  Fecal occult blood test (FOBT) of the stool. You may have this test every year starting at age 36.  Flexible sigmoidoscopy or colonoscopy. You may have a sigmoidoscopy every 5 years or a colonoscopy every 10 years starting at age 61.  Prostate cancer screening. Recommendations will vary depending on your family history and other risks.  Hepatitis C blood test.  Hepatitis B blood test.  Sexually transmitted disease (STD) testing.  Diabetes screening. This is done by checking your blood sugar (glucose) after you have not eaten for a while (fasting). You may have this done every 1-3 years.  Abdominal aortic aneurysm (AAA) screening. You may need this if you are a current or former smoker.  Osteoporosis. You may be  screened starting at age 71 if you are at high risk. Talk with your health care provider about your test results, treatment options, and if necessary, the need for more tests. Vaccines  Your health care provider may recommend certain vaccines, such as:  Influenza vaccine. This is recommended every  year.  Tetanus, diphtheria, and acellular pertussis (Tdap, Td) vaccine. You may need a Td booster every 10 years.  Zoster vaccine. You may need this after age 11.  Pneumococcal 13-valent conjugate (PCV13) vaccine. One dose is recommended after age 72.  Pneumococcal polysaccharide (PPSV23) vaccine. One dose is recommended after age 88. Talk to your health care provider about which screenings and vaccines you need and how often you need them. This information is not intended to replace advice given to you by your health care provider. Make sure you discuss any questions you have with your health care provider. Document Released: 11/20/2015 Document Revised: 07/13/2016 Document Reviewed: 08/25/2015 Elsevier Interactive Patient Education  2017 [employer] Prevention in the Home Falls can cause injuries. They can happen to people of all ages. There are many things you can do to make your home safe and to help prevent falls. What can I do on the outside of my home?  Regularly fix the edges of walkways and driveways and fix any cracks.  Remove anything that might make you trip as you walk through a door, such as a raised step or threshold.  Trim any bushes or trees on the path to your home.  Use bright outdoor lighting.  Clear any walking paths of anything that might make someone trip, such as rocks or tools.  Regularly check to see if handrails are loose or broken. Make sure that both sides of any steps have handrails.  Any raised decks and porches should have guardrails on the edges.  Have any leaves, snow, or ice cleared regularly.  Use sand or salt on walking paths during winter.  Clean up any spills in your garage right away. This includes oil or grease spills. What can I do in the bathroom?  Use night lights.  Install grab bars by the toilet and in the tub and shower. Do not use towel bars as grab bars.  Use non-skid mats or decals in the tub or shower.  If you  need to sit down in the shower, use a plastic, non-slip stool.  Keep the floor dry. Clean up any water that spills on the floor as soon as it happens.  Remove soap buildup in the tub or shower regularly.  Attach bath mats securely with double-sided non-slip rug tape.  Do not have throw rugs and other things on the floor that can make you trip. What can I do in the bedroom?  Use night lights.  Make sure that you have a light by your bed that is easy to reach.  Do not use any sheets or blankets that are too big for your bed. They should not hang down onto the floor.  Have a firm chair that has side arms. You can use this for support while you get dressed.  Do not have throw rugs and other things on the floor that can make you trip. What can I do in the kitchen?  Clean up any spills right away.  Avoid walking on wet floors.  Keep items that you use a lot in easy-to-reach places.  If you need to reach something above you, use a strong step stool that has a  grab bar.  Keep electrical cords out of the way.  Do not use floor polish or wax that makes floors slippery. If you must use wax, use non-skid floor wax.  Do not have throw rugs and other things on the floor that can make you trip. What can I do with my stairs?  Do not leave any items on the stairs.  Make sure that there are handrails on both sides of the stairs and use them. Fix handrails that are broken or loose. Make sure that handrails are as long as the stairways.  Check any carpeting to make sure that it is firmly attached to the stairs. Fix any carpet that is loose or worn.  Avoid having throw rugs at the top or bottom of the stairs. If you do have throw rugs, attach them to the floor with carpet tape.  Make sure that you have a light switch at the top of the stairs and the bottom of the stairs. If you do not have them, ask someone to add them for you. What else can I do to help prevent falls?  Wear shoes  that:  Do not have high heels.  Have rubber bottoms.  Are comfortable and fit you well.  Are closed at the toe. Do not wear sandals.  If you use a stepladder:  Make sure that it is fully opened. Do not climb a closed stepladder.  Make sure that both sides of the stepladder are locked into place.  Ask someone to hold it for you, if possible.  Clearly mark and make sure that you can see:  Any grab bars or handrails.  First and last steps.  Where the edge of each step is.  Use tools that help you move around (mobility aids) if they are needed. These include:  Canes.  Walkers.  Scooters.  Crutches.  Turn on the lights when you go into a dark area. Replace any light bulbs as soon as they burn out.  Set up your furniture so you have a clear path. Avoid moving your furniture around.  If any of your floors are uneven, fix them.  If there are any pets around you, be aware of where they are.  Review your medicines with your doctor. Some medicines can make you feel dizzy. This can increase your chance of falling. Ask your doctor what other things that you can do to help prevent falls. This information is not intended to replace advice given to you by your health care provider. Make sure you discuss any questions you have with your health care provider. Document Released: 08/20/2009 Document Revised: 03/31/2016 Document Reviewed: 11/28/2014 Elsevier Interactive Patient Education  2017 [employer].

## 2017-11-10 NOTE — Progress Notes (Signed)
Subjective:   Ronnie Bullock is a 72 y.o. male who presents for Medicare Annual/Subsequent preventive examination.  Review of Systems:   Cardiac Risk Factors include: advanced age (>53men, >75 women);hypertension     Objective:    Vitals: BP 131/70 (BP Location: Left Arm, Patient Position: Sitting)   Pulse 92   Temp 98.1 F (36.7 C) (Oral)   Resp 16   Ht 5\' 8"  (1.727 m)   Wt 151 lb (68.5 kg)   BMI 22.96 kg/m   Body mass index is 22.96 kg/m.  Advanced Directives 11/10/2017 11/10/2017  Does Patient Have a Medical Advance Directive? No No  Would patient like information on creating a medical advance directive? Yes (MAU/Ambulatory/Procedural Areas - Information given) Yes (MAU/Ambulatory/Procedural Areas - Information given)    Tobacco Social History   Tobacco Use  Smoking Status Never Smoker  Smokeless Tobacco Never Used     Counseling given: Not Answered   Clinical Intake:  Pre-visit preparation completed: Yes  Pain : No/denies pain     Nutritional Status: BMI of 19-24  Normal Nutritional Risks: None Diabetes: No  How often do you need to have someone help you when you read instructions, pamphlets, or other written materials from your doctor or pharmacy?: 5 - Always  Interpreter Needed?: No  Information entered by :: Thresia Ramanathan,LPN  History reviewed. No pertinent past medical history. No past surgical history on file. Family History  Problem Relation Age of Onset  . Hypertension Mother    Social History   Socioeconomic History  . Marital status: Widowed    Spouse name: None  . Number of children: None  . Years of education: None  . Highest education level: None  Social Needs  . Financial resource strain: Not hard at all  . Food insecurity - worry: Never true  . Food insecurity - inability: Never true  . Transportation needs - medical: No  . Transportation needs - non-medical: No  Occupational History  . None  Tobacco Use  . Smoking  status: Never Smoker  . Smokeless tobacco: Never Used  Substance and Sexual Activity  . Alcohol use: No    Alcohol/week: 0.0 oz    Frequency: Never  . Drug use: No  . Sexual activity: Yes  Other Topics Concern  . None  Social History Narrative  . None    Outpatient Encounter Medications as of 11/10/2017  Medication Sig  . amLODipine (NORVASC) 5 MG tablet Take 1 tablet (5 mg total) by mouth daily.   No facility-administered encounter medications on file as of 11/10/2017.     Activities of Daily Living In your present state of health, do you have any difficulty performing the following activities: 11/10/2017 10/10/2017  Hearing? N N  Vision? N N  Difficulty concentrating or making decisions? N N  Walking or climbing stairs? N N  Dressing or bathing? N N  Doing errands, shopping? N N  Preparing Food and eating ? N -  Using the Toilet? N -  In the past six months, have you accidently leaked urine? N -  Do you have problems with loss of bowel control? N -  Managing your Medications? N -  Managing your Finances? N -  Housekeeping or managing your Housekeeping? N -  Some recent data might be hidden    Patient Care Team: Kathrine Haddock, NP as PCP - General (Nurse Practitioner)   Assessment:   This is a routine wellness examination for [place].  Exercise Activities and  Dietary recommendations Current Exercise Habits: Home exercise routine, Type of exercise: treadmill;stretching, Time (Minutes): 60, Frequency (Times/Week): 7, Weekly Exercise (Minutes/Week): 420, Intensity: Mild, Exercise limited by: None identified  Goals    . DIET - INCREASE WATER INTAKE     Recommend drinking at least 6-8 glasses of water a day        Fall Risk Fall Risk  11/10/2017 10/10/2017  Falls in the past year? No No   Is the patient's home free of loose throw rugs in walkways, pet beds, electrical cords, etc?   yes      Grab bars in the bathroom? yes      Handrails on the stairs?   yes      Adequate  lighting?   yes  Timed Get Up and Go Performed: completed in 8 second with no use of assistive devices, steady gait. No intervention needed at this time.  Depression Screen PHQ 2/9 Scores 11/10/2017 10/10/2017  PHQ - 2 Score 0 0  PHQ- 9 Score - 0    Cognitive Function     6CIT Screen 11/10/2017  What Year? 0 points  What month? 0 points  What time? 0 points  Count back from 20 0 points  Months in reverse 0 points  Repeat phrase 10 points  Total Score 10    Immunization History  Administered Date(s) Administered  . Influenza, High Dose Seasonal PF 10/10/2017  . Pneumococcal Conjugate-13 10/10/2017    Qualifies for Shingles Vaccine? Discussed option for shingrix vaccine  Screening Tests Health Maintenance  Topic Date Due  . Hepatitis C Screening  [birth year]  . COLONOSCOPY  11/16/1994  . TETANUS/TDAP  10/10/2018 (Originally 11/17/1964)  . PNA vac Low Risk Adult (2 of 2 - PPSV23) 10/10/2018  . INFLUENZA VACCINE  Completed   Cancer Screenings: Lung: Low Dose CT Chest recommended if Age 76-80 years, 30 pack-year currently smoking OR have quit w/in 15years. Patient does not qualify. Colorectal: referral sent to GI  Additional Screenings:  Hepatitis B/HIV/Syphillis:not indicated Hepatitis C Screening: declined today    Plan:     I have personally reviewed and addressed the Medicare Annual Wellness questionnaire and have noted the following in the patient's chart:  A. Medical and social history B. Use of alcohol, tobacco or illicit drugs  C. Current medications and supplements D. Functional ability and status E.  Nutritional status F.  Physical activity G. Advance directives H. List of other physicians I.  Hospitalizations, surgeries, and ER visits in previous 12 months J.  [place] such as hearing and vision if needed, cognitive and depression L. Referrals and appointments   In addition, I have reviewed and discussed with patient certain preventive  protocols, quality metrics, and best practice recommendations. A written personalized care plan for preventive services as well as general preventive health recommendations were provided to patient.   Signed,  Tyler Aas, LPN Nurse Health Advisor   Nurse Notes: Patient requested to make an appointment to go over Advanced Directives when his Supervisor Leanor Kail, who will be his health care power of attorney, can be present. Patient will call office to schedule this appointment. Advanced Directive packet given to patient to look over with Ronnie Bullock until appt is made.

## 2017-12-21 ENCOUNTER — Encounter: Payer: Self-pay | Admitting: *Deleted

## 2018-04-30 ENCOUNTER — Telehealth: Payer: Self-pay

## 2018-04-30 ENCOUNTER — Other Ambulatory Visit: Payer: Self-pay

## 2018-04-30 DIAGNOSIS — Z1211 Encounter for screening for malignant neoplasm of colon: Secondary | ICD-10-CM

## 2018-04-30 MED ORDER — NA SULFATE-K SULFATE-MG SULF 17.5-3.13-1.6 GM/177ML PO SOLN: 1.0000 | Freq: Once | ORAL | 0 refills | Status: AC

## 2018-04-30 NOTE — Telephone Encounter (Signed)
Patients supervisor Leanor Kail called to schedule patients colonoscopy for him.  I explained to Wille Glaser that he he is not on Mr. Bostrom's HIPPA form.  He placed Ollen Gross on the phone and [place] gave me permission to allow Mr. Kenton Kingfisher to schedule his colonoscopy.  I scheduled colonoscopy for 05/16/18 with Ollen Gross and Wille Glaser on speaker phone.  Instructions will be mailed to Jon's address. Wille Glaser will be taking him to his colonoscopy.  Rx has been sent to pharmacy.  Thanks [employer]

## 2018-05-02 ENCOUNTER — Ambulatory Visit: Payer: Medicare HMO | Admitting: Unknown Physician Specialty

## 2018-05-02 ENCOUNTER — Encounter: Payer: Self-pay | Admitting: Unknown Physician Specialty

## 2018-05-02 ENCOUNTER — Telehealth: Payer: Self-pay | Admitting: Unknown Physician Specialty

## 2018-05-02 DIAGNOSIS — I10 Essential (primary) hypertension: Secondary | ICD-10-CM

## 2018-05-02 DIAGNOSIS — E7800 Pure hypercholesterolemia, unspecified: Secondary | ICD-10-CM | POA: Insufficient documentation

## 2018-05-02 MED ORDER — ATORVASTATIN CALCIUM 20 MG PO TABS: 20.0000 mg | ORAL_TABLET | Freq: Every day | ORAL | 3 refills | Status: DC

## 2018-05-02 NOTE — Assessment & Plan Note (Signed)
Stable, continue present medications.   

## 2018-05-02 NOTE — Telephone Encounter (Signed)
Patient was seen today by Malachy Mood and forgot to let her know that his prostate has been hurting for about 1 year.  Unsure if patient needs to be seen again or by someone else. Please advise.   [phone]  Thank you

## 2018-05-02 NOTE — Assessment & Plan Note (Addendum)
Discussed ASCVD risk.  Start Atorvastatin due to ASCVD risk of almost 20%.  Possible side effects of muscle pain discussed.

## 2018-05-02 NOTE — Telephone Encounter (Signed)
Routing to provider, please advise.

## 2018-05-02 NOTE — Progress Notes (Signed)
BP 133/85   Pulse 96   Temp 98.3 F (36.8 C) (Oral)   Ht 5' 8.5" (1.74 m)   Wt 148 lb 3.2 oz (67.2 kg)   SpO2 96%   BMI 22.21 kg/m    Subjective:    Patient ID: Ronnie Bullock, male    DOB: [birth year], 72 y.o.   MRN: [id number]  HPI: Ronnie Bullock is a 72 y.o. male  Chief Complaint  Patient presents with  . Hypertension    Hypertension Using medications without difficulty Average home BPs: Not checking   No problems or lightheadedness No chest pain with exertion or shortness of breath No Edema  The 10-year ASCVD risk score Mikey Bussing DC Jr., et al., 2013) is: 19.7%   Values used to calculate the score:     Age: 3 years     Sex: Male     Is Non-Hispanic African American: Yes     Diabetic: No     Tobacco smoker: No     Systolic Blood Pressure: 326 mmHg     Is BP treated: Yes     HDL Cholesterol: 71 mg/dL     Total Cholesterol: 183 mg/dL   Relevant past medical, surgical, family and social history reviewed and updated as indicated. Interim medical history since our last visit reviewed. Allergies and medications reviewed and updated.  Review of Systems  Per HPI unless specifically indicated above     Objective:    BP 133/85   Pulse 96   Temp 98.3 F (36.8 C) (Oral)   Ht 5' 8.5" (1.74 m)   Wt 148 lb 3.2 oz (67.2 kg)   SpO2 96%   BMI 22.21 kg/m   Wt Readings from Last 3 Encounters:  05/02/18 148 lb 3.2 oz (67.2 kg)  11/10/17 151 lb 9.6 oz (68.8 kg)  11/10/17 151 lb (68.5 kg)    Physical Exam  Constitutional: He is oriented to person, place, and time. He appears well-developed and well-nourished. No distress.  HENT:  Head: Normocephalic and atraumatic.  Eyes: Conjunctivae and lids are normal. Right eye exhibits no discharge. Left eye exhibits no discharge. No scleral icterus.  Neck: Normal range of motion. Neck supple. No JVD present. Carotid bruit is not present.  Cardiovascular: Normal rate, regular rhythm and normal heart sounds.    Pulmonary/Chest: Effort normal and breath sounds normal. No respiratory distress.  Abdominal: Normal appearance. There is no splenomegaly or hepatomegaly.  Musculoskeletal: Normal range of motion.  Neurological: He is alert and oriented to person, place, and time.  Skin: Skin is warm, dry and intact. No rash noted. No pallor.  Psychiatric: He has a normal mood and affect. His behavior is normal. Judgment and thought content normal.    Results for orders placed or performed in visit on 10/10/17  Microscopic Examination  Result Value Ref Range   WBC, UA 0-5 0 - 5 /hpf   RBC, UA 0-2 0 - 2 /hpf   Epithelial Cells (non renal) CANCELED    Bacteria, UA None seen None seen/Few  Comprehensive metabolic panel  Result Value Ref Range   Glucose 98 65 - 99 mg/dL   BUN 16 8 - 27 mg/dL   Creatinine, Ser 1.15 0.76 - 1.27 mg/dL   GFR calc non Af Amer 63 >59 mL/min/1.73   GFR calc Af Amer 73 >59 mL/min/1.73   BUN/Creatinine Ratio 14 10 - 24   Sodium 142 134 - 144 mmol/L   Potassium 4.5 3.5 - 5.2 mmol/L  Chloride 105 96 - 106 mmol/L   CO2 25 20 - 29 mmol/L   Calcium 9.2 8.6 - 10.2 mg/dL   Total Protein 6.8 6.0 - 8.5 g/dL   Albumin 3.6 3.5 - 4.8 g/dL   Globulin, Total 3.2 1.5 - 4.5 g/dL   Albumin/Globulin Ratio 1.1 (L) 1.2 - 2.2   Bilirubin Total 0.4 0.0 - 1.2 mg/dL   Alkaline Phosphatase 78 39 - 117 IU/L   AST 21 0 - 40 IU/L   ALT 8 0 - 44 IU/L  Lipid Panel w/o Chol/HDL Ratio  Result Value Ref Range   Cholesterol, Total 183 100 - 199 mg/dL   Triglycerides 54 0 - 149 mg/dL   HDL 71 >39 mg/dL   VLDL Cholesterol Cal 11 5 - 40 mg/dL   LDL Calculated 101 (H) 0 - 99 mg/dL  TSH  Result Value Ref Range   TSH 0.846 0.450 - 4.500 uIU/mL  UA/M w/rflx Culture, Routine  Result Value Ref Range   Specific Gravity, UA >1.030 (H) 1.005 - 1.030   pH, UA 5.5 5.0 - 7.5   Color, UA Yellow Yellow   Appearance Ur Clear Clear   Leukocytes, UA Negative Negative   Protein, UA 2+ (A) Negative/Trace    Glucose, UA Negative Negative   Ketones, UA Negative Negative   RBC, UA Negative Negative   Bilirubin, UA Negative Negative   Urobilinogen, Ur 0.2 0.2 - 1.0 mg/dL   Nitrite, UA Negative Negative   Microscopic Examination See below:   f    Assessment & Plan:   Problem List Items Addressed This Visit      Unprioritized   Essential hypertension, benign    Stable, continue present medications.        Relevant Medications   atorvastatin (LIPITOR) 20 MG tablet   Hypercholesteremia    Discussed ASCVD risk.  Start Atorvastatin due to ASCVD risk of almost 20%.  Possible side effects of muscle pain discussed.        Relevant Medications   atorvastatin (LIPITOR) 20 MG tablet       Follow up plan: Return in about 6 months (around 11/01/2018) for physical.

## 2018-05-04 NOTE — Telephone Encounter (Signed)
He is due to be seen in 6 months for a physical.  We can move that appt up to get it assessed.  Should be seen here

## 2018-05-07 ENCOUNTER — Ambulatory Visit: Payer: Medicare HMO | Admitting: Unknown Physician Specialty

## 2018-05-07 ENCOUNTER — Encounter: Payer: Self-pay | Admitting: Unknown Physician Specialty

## 2018-05-07 VITALS — BP 125/86 | HR 98 | Temp 98.2°F | Ht 68.5 in | Wt 150.1 lb

## 2018-05-07 DIAGNOSIS — N489 Disorder of penis, unspecified: Secondary | ICD-10-CM | POA: Diagnosis not present

## 2018-05-07 DIAGNOSIS — N5089 Other specified disorders of the male genital organs: Secondary | ICD-10-CM | POA: Diagnosis not present

## 2018-05-07 DIAGNOSIS — N4889 Other specified disorders of penis: Secondary | ICD-10-CM

## 2018-05-07 DIAGNOSIS — N400 Enlarged prostate without lower urinary tract symptoms: Secondary | ICD-10-CM | POA: Diagnosis not present

## 2018-05-07 NOTE — Telephone Encounter (Signed)
Spoke with patients friend that helps with his care, he will see that patient comes in today to discuss his issue

## 2018-05-07 NOTE — Progress Notes (Signed)
BP 125/86   Pulse 98   Temp 98.2 F (36.8 C) (Oral)   Ht 5' 8.5" (1.74 m)   Wt 150 lb 1.6 oz (68.1 kg)   SpO2 96%   BMI 22.49 kg/m    Subjective:    Patient ID: Ronnie Bullock, male    DOB: 1946/10/27, 72 y.o.   MRN: [id number]  HPI: Ronnie Bullock is a 72 y.o. male  Chief Complaint  Patient presents with  . Prostate Check    pt states he has had prostate enlargement for the past 5 years, here to have it checked   Pt states he has soreness in his penis for 5 years. States he has been reluctant to say something.   He has noticed some swelling.  No pain with urination. Ale to get an erection without pain.    States it doesn't really hurt but "wants to hurt".  No problems with urination.  No PSA done.    Relevant past medical, surgical, family and social history reviewed and updated as indicated. Interim medical history since our last visit reviewed. Allergies and medications reviewed and updated.  Review of Systems  Per HPI unless specifically indicated above     Objective:    BP 125/86   Pulse 98   Temp 98.2 F (36.8 C) (Oral)   Ht 5' 8.5" (1.74 m)   Wt 150 lb 1.6 oz (68.1 kg)   SpO2 96%   BMI 22.49 kg/m   Wt Readings from Last 3 Encounters:  05/07/18 150 lb 1.6 oz (68.1 kg)  05/02/18 148 lb 3.2 oz (67.2 kg)  11/10/17 151 lb 9.6 oz (68.8 kg)    Physical Exam  Constitutional: He is oriented to person, place, and time. He appears well-developed and well-nourished. No distress.  HENT:  Head: Normocephalic and atraumatic.  Eyes: Conjunctivae and lids are normal. Right eye exhibits no discharge. Left eye exhibits no discharge. No scleral icterus.  Cardiovascular: Normal rate.  Pulmonary/Chest: Effort normal.  Abdominal: Normal appearance. There is no splenomegaly or hepatomegaly.  Genitourinary:  Genitourinary Comments: Very enlarged scrotum.  Penis seems normal sized to me.  No lesions or tenderness  Musculoskeletal: Normal range of motion.  Neurological:  He is alert and oriented to person, place, and time.  Skin: Skin is intact. No rash noted. No pallor.  Psychiatric: He has a normal mood and affect. His behavior is normal. Judgment and thought content normal.    Results for orders placed or performed in visit on 10/10/17  Microscopic Examination  Result Value Ref Range   WBC, UA 0-5 0 - 5 /hpf   RBC, UA 0-2 0 - 2 /hpf   Epithelial Cells (non renal) CANCELED    Bacteria, UA None seen None seen/Few  Comprehensive metabolic panel  Result Value Ref Range   Glucose 98 65 - 99 mg/dL   BUN 16 8 - 27 mg/dL   Creatinine, Ser 1.15 0.76 - 1.27 mg/dL   GFR calc non Af Amer 63 >59 mL/min/1.73   GFR calc Af Amer 73 >59 mL/min/1.73   BUN/Creatinine Ratio 14 10 - 24   Sodium 142 134 - 144 mmol/L   Potassium 4.5 3.5 - 5.2 mmol/L   Chloride 105 96 - 106 mmol/L   CO2 25 20 - 29 mmol/L   Calcium 9.2 8.6 - 10.2 mg/dL   Total Protein 6.8 6.0 - 8.5 g/dL   Albumin 3.6 3.5 - 4.8 g/dL   Globulin, Total 3.2 1.5 -  4.5 g/dL   Albumin/Globulin Ratio 1.1 (L) 1.2 - 2.2   Bilirubin Total 0.4 0.0 - 1.2 mg/dL   Alkaline Phosphatase 78 39 - 117 IU/L   AST 21 0 - 40 IU/L   ALT 8 0 - 44 IU/L  Lipid Panel w/o Chol/HDL Ratio  Result Value Ref Range   Cholesterol, Total 183 100 - 199 mg/dL   Triglycerides 54 0 - 149 mg/dL   HDL 71 >39 mg/dL   VLDL Cholesterol Cal 11 5 - 40 mg/dL   LDL Calculated 101 (H) 0 - 99 mg/dL  TSH  Result Value Ref Range   TSH 0.846 0.450 - 4.500 uIU/mL  UA/M w/rflx Culture, Routine  Result Value Ref Range   Specific Gravity, UA >1.030 (H) 1.005 - 1.030   pH, UA 5.5 5.0 - 7.5   Color, UA Yellow Yellow   Appearance Ur Clear Clear   Leukocytes, UA Negative Negative   Protein, UA 2+ (A) Negative/Trace   Glucose, UA Negative Negative   Ketones, UA Negative Negative   RBC, UA Negative Negative   Bilirubin, UA Negative Negative   Urobilinogen, Ur 0.2 0.2 - 1.0 mg/dL   Nitrite, UA Negative Negative   Microscopic Examination See  below:       Assessment & Plan:   Problem List Items Addressed This Visit    None    Visit Diagnoses    Enlargement of scrotal sac    -  Primary   Marked scrotal enlargement, bilateral.  Refer to urology for further assessment and management.  Check PSA.  Get scrotal US   Relevant Orders   US Scrotum   PSA   CBC with Differential/Platelet   Ambulatory referral to Urology   Tenderness of penis       Relevant Orders   Ambulatory referral to Urology      New problems  Follow up plan: No follow-ups on file.

## 2018-05-08 ENCOUNTER — Other Ambulatory Visit: Payer: Self-pay | Admitting: Unknown Physician Specialty

## 2018-05-08 DIAGNOSIS — N5089 Other specified disorders of the male genital organs: Secondary | ICD-10-CM

## 2018-05-08 LAB — CBC WITH DIFFERENTIAL/PLATELET
BASOS ABS: 0 10*3/uL (ref 0.0–0.2)
Basos: 0 %
EOS (ABSOLUTE): 0.4 10*3/uL (ref 0.0–0.4)
Eos: 8 %
HEMATOCRIT: 39.8 % (ref 37.5–51.0)
Hemoglobin: 12.9 g/dL — ABNORMAL LOW (ref 13.0–17.7)
IMMATURE GRANULOCYTES: 0 %
Immature Grans (Abs): 0 10*3/uL (ref 0.0–0.1)
LYMPHS ABS: 1.4 10*3/uL (ref 0.7–3.1)
Lymphs: 27 %
MCH: 29.4 pg (ref 26.6–33.0)
MCHC: 32.4 g/dL (ref 31.5–35.7)
MCV: 91 fL (ref 79–97)
MONOS ABS: 0.4 10*3/uL (ref 0.1–0.9)
Monocytes: 9 %
NEUTROS PCT: 56 %
Neutrophils Absolute: 2.8 10*3/uL (ref 1.4–7.0)
PLATELETS: 220 10*3/uL (ref 150–450)
RBC: 4.39 x10E6/uL (ref 4.14–5.80)
RDW: 14.8 % (ref 12.3–15.4)
WBC: 5 10*3/uL (ref 3.4–10.8)

## 2018-05-08 LAB — PSA: PROSTATE SPECIFIC AG, SERUM: 4.5 ng/mL — AB (ref 0.0–4.0)

## 2018-05-15 ENCOUNTER — Encounter: Payer: Self-pay | Admitting: *Deleted

## 2018-05-16 ENCOUNTER — Ambulatory Visit
Admission: RE | Admit: 2018-05-16 | Discharge: 2018-05-16 | Disposition: A | Discharge status: Home / Self Care | Payer: Medicare HMO | Source: Ambulatory Visit | Attending: Gastroenterology | Admitting: Gastroenterology

## 2018-05-16 ENCOUNTER — Encounter: Payer: Self-pay | Admitting: *Deleted

## 2018-05-16 ENCOUNTER — Ambulatory Visit: Payer: Medicare HMO | Admitting: Anesthesiology

## 2018-05-16 ENCOUNTER — Encounter
Admission: RE | Disposition: A | Discharge status: Home / Self Care | Payer: Self-pay | Source: Ambulatory Visit | Attending: Gastroenterology

## 2018-05-16 DIAGNOSIS — I10 Essential (primary) hypertension: Secondary | ICD-10-CM | POA: Diagnosis not present

## 2018-05-16 DIAGNOSIS — D122 Benign neoplasm of ascending colon: Secondary | ICD-10-CM | POA: Diagnosis not present

## 2018-05-16 DIAGNOSIS — K635 Polyp of colon: Secondary | ICD-10-CM | POA: Diagnosis not present

## 2018-05-16 DIAGNOSIS — D120 Benign neoplasm of cecum: Secondary | ICD-10-CM

## 2018-05-16 DIAGNOSIS — Z79899 Other long term (current) drug therapy: Secondary | ICD-10-CM | POA: Diagnosis not present

## 2018-05-16 DIAGNOSIS — Z1211 Encounter for screening for malignant neoplasm of colon: Secondary | ICD-10-CM

## 2018-05-16 HISTORY — DX: Essential (primary) hypertension: I10

## 2018-05-16 HISTORY — PX: COLONOSCOPY WITH PROPOFOL: SHX5780

## 2018-05-16 SURGERY — COLONOSCOPY WITH PROPOFOL
Anesthesia: General

## 2018-05-16 MED ORDER — PROPOFOL 500 MG/50ML IV EMUL
INTRAVENOUS | Status: AC
  Filled 2018-05-16: qty 50

## 2018-05-16 MED ORDER — LIDOCAINE HCL (PF) 2 % IJ SOLN
INTRAMUSCULAR | Status: AC
  Filled 2018-05-16: qty 10

## 2018-05-16 MED ORDER — LIDOCAINE HCL (CARDIAC) PF 100 MG/5ML IV SOSY
PREFILLED_SYRINGE | INTRAVENOUS | Status: DC | PRN
  Administered 2018-05-16: 40 mg via INTRAVENOUS

## 2018-05-16 MED ORDER — PHENYLEPHRINE HCL 10 MG/ML IJ SOLN
INTRAMUSCULAR | Status: DC | PRN
  Administered 2018-05-16: 100 ug via INTRAVENOUS

## 2018-05-16 MED ORDER — PROPOFOL 10 MG/ML IV BOLUS
INTRAVENOUS | Status: DC | PRN
  Administered 2018-05-16: 70 mg via INTRAVENOUS
  Administered 2018-05-16: 10 mg via INTRAVENOUS

## 2018-05-16 MED ORDER — PROPOFOL 500 MG/50ML IV EMUL
INTRAVENOUS | Status: DC | PRN
  Administered 2018-05-16: 150 ug/kg/min via INTRAVENOUS

## 2018-05-16 MED ORDER — SODIUM CHLORIDE 0.9 % IV SOLN
INTRAVENOUS | Status: DC
  Administered 2018-05-16: 1000 mL via INTRAVENOUS

## 2018-05-16 NOTE — Anesthesia Preprocedure Evaluation (Signed)
Anesthesia Evaluation  Patient identified by MRN, date of birth, ID band Patient awake    Reviewed: Allergy & Precautions, H&P , NPO status , Patient's Chart, lab work & pertinent test results, reviewed documented beta blocker date and time   History of Anesthesia Complications Negative for: history of anesthetic complications  Airway Mallampati: II  TM Distance: >3 FB Neck ROM: full    Dental no notable dental hx. (+) Edentulous Upper, Edentulous Lower   Pulmonary neg pulmonary ROS, neg COPD,    Pulmonary exam normal        Cardiovascular Exercise Tolerance: Good hypertension, (-) CAD, (-) Past MI, (-) Cardiac Stents and (-) CABG negative cardio ROS Normal cardiovascular exam(-) dysrhythmias      Neuro/Psych negative neurological ROS  negative psych ROS   GI/Hepatic negative GI ROS, Neg liver ROS,   Endo/Other  negative endocrine ROSneg diabetes  Renal/GU negative Renal ROS  negative genitourinary   Musculoskeletal   Abdominal   Peds  Hematology negative hematology ROS (+)   Anesthesia Other Findings Past Medical History: No date: Hypertension  History reviewed. No pertinent surgical history.  BMI    Body Mass Index:  22.48 kg/m     Reproductive/Obstetrics negative OB ROS                             Anesthesia Physical Anesthesia Plan  ASA: II  Anesthesia Plan: General   Post-op Pain Management:    Induction:   PONV Risk Score and Plan:   Airway Management Planned:   Additional Equipment:   Intra-op Plan:   Post-operative Plan:   Informed Consent:   Dental Advisory Given  Plan Discussed with: Anesthesiologist, CRNA and Surgeon  Anesthesia Plan Comments:         Anesthesia Quick Evaluation

## 2018-05-16 NOTE — Anesthesia Post-op Follow-up Note (Signed)
Anesthesia QCDR form completed.        

## 2018-05-16 NOTE — Anesthesia Postprocedure Evaluation (Signed)
Anesthesia Post Note  Patient: Ronnie Bullock  Procedure(s) Performed: COLONOSCOPY WITH PROPOFOL (N/A )  Patient location during evaluation: PACU Anesthesia Type: General Level of consciousness: awake and alert Pain management: pain level controlled Vital Signs Assessment: post-procedure vital signs reviewed and stable Respiratory status: spontaneous breathing, nonlabored ventilation, respiratory function stable and patient connected to nasal cannula oxygen Cardiovascular status: blood pressure returned to baseline and stable Postop Assessment: no apparent nausea or vomiting Anesthetic complications: no     Last Vitals:  Vitals:   05/16/18 1105 05/16/18 1125  BP: 103/79 (!) 133/98  Pulse: 69 62  Resp: 20 12  Temp: (!) 36.1 C   SpO2: 96% 100%    Last Pain:  Vitals:   05/16/18 1125  TempSrc:   PainSc: 0-No pain                 Durenda Hurt

## 2018-05-16 NOTE — Transfer of Care (Signed)
Immediate Anesthesia Transfer of Care Note  Patient: Ronnie Bullock  Procedure(s) Performed: Procedure(s): COLONOSCOPY WITH PROPOFOL (N/A)  Patient Location: PACU and Endoscopy Unit  Anesthesia Type:General  Level of Consciousness: sedated  Airway & Oxygen Therapy: Patient Spontanous Breathing and Patient connected to nasal cannula oxygen  Post-op Assessment: Report given to RN and Post -op Vital signs reviewed and stable  Post vital signs: Reviewed and stable  Last Vitals:  Vitals:   05/16/18 0934 05/16/18 1105  BP: (!) 141/98 103/79  Pulse: 77 69  Resp: 18 20  Temp: (!) 36.4 C (!) 36.1 C  SpO2: 39% 17%    Complications: No apparent anesthesia complications

## 2018-05-16 NOTE — Anesthesia Procedure Notes (Signed)
Date/Time: 05/16/2018 10:25 AM Performed by: Doreen Salvage, CRNA Pre-anesthesia Checklist: Patient identified, Emergency Drugs available, Suction available and Patient being monitored Patient Re-evaluated:Patient Re-evaluated prior to induction Oxygen Delivery Method: Nasal cannula Induction Type: IV induction Dental Injury: Teeth and Oropharynx as per pre-operative assessment  Comments: Nasal cannula with etCO2 monitoring

## 2018-05-16 NOTE — H&P (Signed)
Vonda Antigua, MD [address] [place], [place], [place], [address] [address], [place], [place], [place], [address] Phone: [phone]  Fax: [phone]  Primary Care Physician:  Guadalupe Maple, MD   Pre-Procedure History & Physical: HPI:  Ronnie Bullock is a 72 y.o. male is here for a colonoscopy.   Past Medical History:  Diagnosis Date  . Hypertension     History reviewed. No pertinent surgical history.  Prior to Admission medications   Medication Sig Start Date End Date Taking? Authorizing Provider  amLODipine (NORVASC) 5 MG tablet Take 1 tablet (5 mg total) by mouth daily. 10/10/17   Kathrine Haddock, NP  atorvastatin (LIPITOR) 20 MG tablet Take 1 tablet (20 mg total) by mouth daily. 05/02/18   Kathrine Haddock, NP    Allergies as of 04/30/2018  . (No Known Allergies)    Family History  Problem Relation Age of Onset  . Hypertension Mother     Social History   Socioeconomic History  . Marital status: Widowed    Spouse name: Not on file  . Number of children: Not on file  . Years of education: Not on file  . Highest education level: Not on file  Occupational History  . Not on file  Social Needs  . Financial resource strain: Not hard at all  . Food insecurity:    Worry: Never true    Inability: Never true  . Transportation needs:    Medical: No    Non-medical: No  Tobacco Use  . Smoking status: Never Smoker  . Smokeless tobacco: Never Used  Substance and Sexual Activity  . Alcohol use: No    Alcohol/week: 0.0 oz    Frequency: Never  . Drug use: No  . Sexual activity: Yes  Lifestyle  . Physical activity:    Days per week: 0 days    Minutes per session: 0 min  . Stress: Not at all  Relationships  . Social connections:    Talks on phone: More than three times a week    Gets together: Twice a week    Attends religious service: More than 4 times per year    Active member of club or organization: Patient refused    Attends meetings of  clubs or organizations: More than 4 times per year    Relationship status: Widowed  . Intimate partner violence:    Fear of current or ex partner: No    Emotionally abused: No    Physically abused: No    Forced sexual activity: No  Other Topics Concern  . Not on file  Social History Narrative  . Not on file    Review of Systems: See HPI, otherwise negative ROS  Physical Exam: There were no vitals taken for this visit. General:   Alert,  pleasant and cooperative in NAD Head:  Normocephalic and atraumatic. Neck:  Supple; no masses or thyromegaly. Lungs:  Clear throughout to auscultation, normal respiratory effort.    Heart:  +S1, +S2, Regular rate and rhythm, No edema. Abdomen:  Soft, nontender and nondistended. Normal bowel sounds, without guarding, and without rebound.   Neurologic:  Alert and  oriented x4;  grossly normal neurologically.  Impression/Plan: Ronnie Bullock is here for a colonoscopy to be performed for average risk screening.  Risks, benefits, limitations, and alternatives regarding  colonoscopy have been reviewed with the patient.  Questions have been answered.  All parties agreeable.   Virgel Manifold, MD  05/16/2018, 8:56 AM

## 2018-05-16 NOTE — Op Note (Signed)
[facility] Patient Name: Ronnie Bullock Procedure Date: 05/16/2018 10:22 AM MRN: [id number] Account #: [id number] Date of Birth: [birth year] Admit Type: Outpatient Age: 72 Room: [facility] ENDO ROOM 2 Gender: Male Note Status: Finalized Procedure:            Colonoscopy Indications:          Screening for colorectal malignant neoplasm Providers:            Crimson Dubberly B. Bonna Gains MD, MD Referring MD:         Guadalupe Maple, MD (Referring MD) Medicines:            Monitored Anesthesia Care Complications:        No immediate complications. Procedure:            Pre-Anesthesia Assessment:                       - ASA Grade Assessment: II - A patient with mild                        systemic disease.                       - Prior to the procedure, a History and Physical was                        performed, and patient medications, allergies and                        sensitivities were reviewed. The patient's tolerance of                        previous anesthesia was reviewed.                       - The risks and benefits of the procedure and the                        sedation options and risks were discussed with the                        patient. All questions were answered and informed                        consent was obtained.                       - Patient identification and proposed procedure were                        verified prior to the procedure by the physician, the                        nurse, the anesthesiologist, the anesthetist and the                        technician. The procedure was verified in the procedure                        room.  After obtaining informed consent, the colonoscope was                        passed under direct vision. Throughout the procedure,                        the patient's blood pressure, pulse, and oxygen                        saturations were monitored continuously. The                      Colonoscope was introduced through the anus and                        advanced to the the cecum, identified by appendiceal                        orifice and ileocecal valve. The colonoscopy was                        performed with ease. The patient tolerated the                        procedure well. The quality of the bowel preparation                        was good. Findings:      The perianal and digital rectal examinations were normal.      Two sessile polyps were found in the ascending colon and cecum. The       polyps were 2 mm in size. These polyps were removed with a cold biopsy       forceps. Resection and retrieval were complete.      The exam was otherwise without abnormality.      The rectum, sigmoid colon, descending colon, transverse colon, ascending       colon and cecum appeared normal.      The retroflexed view of the distal rectum and anal verge was normal and       showed no anal or rectal abnormalities. Impression:           - Two 2 mm polyps in the ascending colon and in the                        cecum, removed with a cold biopsy forceps. Resected and                        retrieved.                       - The examination was otherwise normal.                       - The rectum, sigmoid colon, descending colon,                        transverse colon, ascending colon and cecum are normal.                       - The distal rectum and anal verge are normal on  retroflexion view. Recommendation:       - Discharge patient to home (with escort).                       - Advance diet as tolerated.                       - Continue present medications.                       - Await pathology results.                       - The findings and recommendations were discussed with                        the patient.                       - The findings and recommendations were discussed with                        the patient's  family.                       - Return to primary care physician as previously                        scheduled.                       - If the pathology report reveals adenomatous tissue,                        then repeat the colonoscopy for surveillance in 5 years.                       - If the pathology report indicates hyperplastic polyp,                        then repeat colonoscopy for screening purposes in 10                        years. Risks and benefits of procedure to be weighed at                        that time prior to scheduling as pt. will be above 57                        years of age in 68 yrs Procedure Code(s):    --- Professional ---                       [phone], Colonoscopy, flexible; with biopsy, single or                        multiple Diagnosis Code(s):    --- Professional ---                       Z12.11, Encounter for screening for malignant neoplasm  of colon                       D12.2, Benign neoplasm of ascending colon                       D12.0, Benign neoplasm of cecum CPT copyright 2017 [employer]. All rights reserved. The codes documented in this report are preliminary and upon coder review may  be revised to meet current compliance requirements.  Vonda Antigua, MD Margretta Sidle B. Bonna Gains MD, MD 05/16/2018 11:02:14 AM This report has been signed electronically. Number of Addenda: 0 Note Initiated On: 05/16/2018 10:22 AM Scope Withdrawal Time: 0 hours 17 minutes 11 seconds  Total Procedure Duration: 0 hours 27 minutes 6 seconds  Estimated Blood Loss: Estimated blood loss: none.      [facility]

## 2018-05-17 ENCOUNTER — Ambulatory Visit
Admission: RE | Admit: 2018-05-17 | Discharge: 2018-05-17 | Disposition: A | Discharge status: Home / Self Care | Payer: Medicare HMO | Source: Ambulatory Visit | Attending: Unknown Physician Specialty | Admitting: Unknown Physician Specialty

## 2018-05-17 DIAGNOSIS — N5089 Other specified disorders of the male genital organs: Secondary | ICD-10-CM | POA: Diagnosis not present

## 2018-05-17 DIAGNOSIS — N433 Hydrocele, unspecified: Secondary | ICD-10-CM | POA: Diagnosis not present

## 2018-05-17 LAB — SURGICAL PATHOLOGY

## 2018-05-18 ENCOUNTER — Ambulatory Visit: Payer: Medicare HMO | Admitting: Unknown Physician Specialty

## 2018-05-18 ENCOUNTER — Encounter: Payer: Self-pay | Admitting: Gastroenterology

## 2018-05-20 ENCOUNTER — Encounter: Payer: Self-pay | Admitting: Gastroenterology

## 2018-06-14 ENCOUNTER — Encounter: Payer: Self-pay | Admitting: Urology

## 2018-06-14 ENCOUNTER — Ambulatory Visit: Payer: Medicare HMO | Admitting: Urology

## 2018-06-14 VITALS — BP 162/91 | HR 90 | Ht 68.5 in | Wt 153.0 lb

## 2018-06-14 DIAGNOSIS — R972 Elevated prostate specific antigen [PSA]: Secondary | ICD-10-CM | POA: Diagnosis not present

## 2018-06-14 DIAGNOSIS — N401 Enlarged prostate with lower urinary tract symptoms: Secondary | ICD-10-CM

## 2018-06-14 DIAGNOSIS — N138 Other obstructive and reflux uropathy: Secondary | ICD-10-CM

## 2018-06-14 DIAGNOSIS — K4090 Unilateral inguinal hernia, without obstruction or gangrene, not specified as recurrent: Secondary | ICD-10-CM | POA: Diagnosis not present

## 2018-06-14 DIAGNOSIS — N5089 Other specified disorders of the male genital organs: Secondary | ICD-10-CM

## 2018-06-14 LAB — URINALYSIS, COMPLETE
Bilirubin, UA: NEGATIVE
GLUCOSE, UA: NEGATIVE
KETONES UA: NEGATIVE
Leukocytes, UA: NEGATIVE
Nitrite, UA: NEGATIVE
RBC, UA: NEGATIVE
Specific Gravity, UA: 1.02 (ref 1.005–1.030)
Urobilinogen, Ur: 0.2 mg/dL (ref 0.2–1.0)
pH, UA: 6.5 (ref 5.0–7.5)

## 2018-06-14 LAB — MICROSCOPIC EXAMINATION
Bacteria, UA: NONE SEEN
Epithelial Cells (non renal): NONE SEEN /hpf (ref 0–10)
WBC, UA: NONE SEEN /hpf (ref 0–5)

## 2018-06-14 NOTE — Progress Notes (Signed)
06/14/2018 9:07 AM   Ronnie Bullock 12/13/1945 [id number]  Referring provider: Kathrine Haddock, NP [address], [place] [address]  No chief complaint on file.   HPI: 72 year old referred for elevated PSA and hydrocele.  Patient underwent PSA screening and PSA was noted to be 4.5 May 07, 2018.  He has a history of BPH on surveillance. He takes a supplement "off the TV."  No FH of PCa. No prior biopsy. He has some urinary hesitancy, straining to void and urgency. Noc x 2. He has an adeqaute stream and feels empty. He denies ED. Not in a relationship right now.   Patient also complains of scrotal swelling. He's noticed scrotal swelling for 5 years and they are slowly enlarging. He underwent ultrasound of the scrotum May 17, 2018 which revealed a 10 cm right and a 9.4 cm left hydrocele. Two mm appearing cysts or dilation in the right testicle. It is not bothersome.   Modifying factors: There are no other modifying factors  Associated signs and symptoms: There are no other associated signs and symptoms Aggravating and relieving factors: There are no other aggravating or relieving factors Severity: Moderate Duration: Persistent     PMH: Past Medical History:  Diagnosis Date  . Hypertension     Surgical History: Past Surgical History:  Procedure Laterality Date  . COLONOSCOPY WITH PROPOFOL N/A 05/16/2018   Procedure: COLONOSCOPY WITH PROPOFOL;  Surgeon: Virgel Manifold, MD;  Location: [facility] ENDOSCOPY;  Service: Endoscopy;  Laterality: N/A;    Home Medications:  Allergies as of 06/14/2018   No Known Allergies     Medication List        Accurate as of 06/14/18  9:07 AM. Always use your most recent med list.          amLODipine 5 MG tablet Commonly known as:  NORVASC Take 1 tablet (5 mg total) by mouth daily.   atorvastatin 20 MG tablet Commonly known as:  LIPITOR Take 1 tablet (20 mg total) by mouth daily.       Allergies: No Known Allergies  Family  History: Family History  Problem Relation Age of Onset  . Hypertension Mother     Social History:  reports that he has never smoked. He has never used smokeless tobacco. He reports that he does not drink alcohol or use drugs.  ROS:                                        Physical Exam: There were no vitals taken for this visit.  Constitutional:  Alert and oriented, No acute distress. HEENT: [address] AT, moist mucus membranes.  Trachea midline, no masses. Cardiovascular: No clubbing, cyanosis, or edema. Respiratory: Normal respiratory effort, no increased work of breathing. GI: Abdomen is soft, nontender, nondistended, no abdominal masses; small right inguinal hernia easily reducible and non-tender.  GU: No CVA tenderness;  Penis: uncircumcised, no phimosis or lesion. Scab right dorsal healing (pt said from masturbation). Meatus normal and no discharge.  Scrotum:bilateral hydrocele  DRE: prostate about 30 grams and smooth without hard area or nodule  Lymph: No cervical or inguinal lymphadenopathy. Skin: No rashes, bruises or suspicious lesions. Neurologic: Grossly intact, no focal deficits, moving all 4 extremities. Psychiatric: Normal mood and affect.  Laboratory Data: Lab Results  Component Value Date   WBC 5.0 05/07/2018   HGB 12.9 (L) 05/07/2018   HCT 39.8 05/07/2018  MCV 91 05/07/2018   PLT 220 05/07/2018    Lab Results  Component Value Date   CREATININE 1.15 10/10/2017    No results found for: PSA  No results found for: TESTOSTERONE  No results found for: HGBA1C  Urinalysis    Component Value Date/Time   APPEARANCEUR Clear 10/10/2017 1025   GLUCOSEU Negative 10/10/2017 1025   BILIRUBINUR Negative 10/10/2017 1025   PROTEINUR 2+ (A) 10/10/2017 1025   NITRITE Negative 10/10/2017 1025   LEUKOCYTESUR Negative 10/10/2017 1025    Lab Results  Component Value Date   LABMICR See below: 10/10/2017   WBCUA 0-5 10/10/2017   RBCUA 0-2  10/10/2017   LABEPIT CANCELED 10/10/2017   BACTERIA None seen 10/10/2017    Pertinent Imaging: Scrotal US  No results found for this or any previous visit. No results found for this or any previous visit. No results found for this or any previous visit. No results found for this or any previous visit. No results found for this or any previous visit. No results found for this or any previous visit. No results found for this or any previous visit. No results found for this or any previous visit.  Assessment & Plan:    1. Scrotal swelling Discussed nature r/b/a to hydroclectomy and pt not bothered -- will continue to monitor. He also has a small right inguinal hernia and we discussed referral to Gen Surg and again, pt not bothered and will monitor. Discussed warning sign for incarceration.  - Urinalysis, Complete  2. PSA elevation - I had a long discussion with the patient on the nature of elevated PSA - benign vs malignant causes. We discussed age specific levels and that PCa can be seen on a biopsy with very low PSA levels (<=2.5). We discussed the nature risks and benefits of continued surveillance, other lab tests, imaging as well as prostate biopsy. We discussed the management of prostate cancer might include active surveillance or treatment depending on biopsy findings. All questions answered. DRE was normal, but will check a PSA and consider biopsy vs close follow-up.  3. BPH with LUTS - discussed surveillance vs meds and will continue surveillance.    No follow-ups on file.  Festus Aloe, MD  [facility] [address] [place], [place] [address] [phone]

## 2018-06-15 LAB — PSA, TOTAL AND FREE
PROSTATE SPECIFIC AG, SERUM: 4.8 ng/mL — AB (ref 0.0–4.0)
PSA, Free Pct: 22.7 %
PSA, Free: 1.09 ng/mL

## 2018-06-18 ENCOUNTER — Telehealth: Payer: Self-pay

## 2018-06-18 DIAGNOSIS — R972 Elevated prostate specific antigen [PSA]: Secondary | ICD-10-CM

## 2018-06-18 NOTE — Telephone Encounter (Signed)
Patient notified and scheduled Lab, follow up appointments

## 2018-06-18 NOTE — Telephone Encounter (Signed)
-----   Message from Festus Aloe, MD sent at 06/15/2018 11:24 PM EDT ----- His PSa is only up slightly compared to what it was last month. I recommend a repeat PSA in 3 months -- f/u to review. Thanks.   ----- Message ----- From: Garnette Gunner, CMA Sent: 06/15/2018   8:08 AM EDT To: Festus Aloe, MD    ----- Message ----- From: Lavone Neri Lab Results In Sent: 06/14/2018   9:37 AM EDT To: Rowe [facility]

## 2018-06-18 NOTE — Addendum Note (Signed)
Addended by: Kyra Manges on: 06/18/2018 04:43 PM   Modules accepted: Orders

## 2018-09-19 ENCOUNTER — Other Ambulatory Visit: Payer: Medicare HMO

## 2018-09-19 DIAGNOSIS — R972 Elevated prostate specific antigen [PSA]: Secondary | ICD-10-CM | POA: Diagnosis not present

## 2018-09-20 ENCOUNTER — Ambulatory Visit: Payer: Medicare HMO | Admitting: Urology

## 2018-09-20 ENCOUNTER — Encounter: Payer: Self-pay | Admitting: Urology

## 2018-09-20 VITALS — BP 161/99 | HR 85 | Ht 68.5 in | Wt 157.0 lb

## 2018-09-20 DIAGNOSIS — R972 Elevated prostate specific antigen [PSA]: Secondary | ICD-10-CM | POA: Diagnosis not present

## 2018-09-20 LAB — PSA: Prostate Specific Ag, Serum: 4.7 ng/mL — ABNORMAL HIGH (ref 0.0–4.0)

## 2018-09-20 NOTE — Progress Notes (Signed)
09/20/2018 2:14 PM   Andria Rhein Eder November 01, 1946 [id number]  Referring provider: Guadalupe Maple, MD [address] [place], [place] [address]  Chief Complaint  Patient presents with  . Elevated PSA    HPI: 72 year old male who was seen by Dr. Junious Silk on 06/14/2018 for mild PSA elevation and large, bilateral hydroceles.  His PSA was 4.5.  It was repeated at that visit and was 4.8 with a percent free PSA of 22.7.  Dr. Junious Silk recommended a 59-month follow-up.  A repeat PSA on 11/13 was stable at 4.7.  He denies bothersome lower urinary tract symptoms.  Denies dysuria or gross hematuria.  He also has asymptomatic bilateral hydroceles and did not desire any treatment.  He states these have not changed and he does not desire any intervention.   PMH: Past Medical History:  Diagnosis Date  . Hyperlipidemia   . Hypertension     Surgical History: Past Surgical History:  Procedure Laterality Date  . COLONOSCOPY WITH PROPOFOL N/A 05/16/2018   Procedure: COLONOSCOPY WITH PROPOFOL;  Surgeon: Virgel Manifold, MD;  Location: [facility] ENDOSCOPY;  Service: Endoscopy;  Laterality: N/A;    Home Medications:  Allergies as of 09/20/2018   No Known Allergies     Medication List        Accurate as of 09/20/18  2:14 PM. Always use your most recent med list.          amLODipine 5 MG tablet Commonly known as:  NORVASC Take 1 tablet (5 mg total) by mouth daily.   atorvastatin 20 MG tablet Commonly known as:  LIPITOR Take 1 tablet (20 mg total) by mouth daily.       Allergies: No Known Allergies  Family History: Family History  Problem Relation Age of Onset  . Hypertension Mother   . Prostate cancer Neg Hx   . Bladder Cancer Neg Hx   . Kidney cancer Neg Hx     Social History:  reports that he has never smoked. He has never used smokeless tobacco. He reports that he does not drink alcohol or use drugs.  ROS: UROLOGY Frequent Urination?: No Hard to postpone urination?:  No Burning/pain with urination?: No Get up at night to urinate?: No Leakage of urine?: No Urine stream starts and stops?: No Trouble starting stream?: Yes Do you have to strain to urinate?: No Blood in urine?: No Urinary tract infection?: No Sexually transmitted disease?: No Injury to kidneys or bladder?: No Painful intercourse?: No Weak stream?: No Erection problems?: No Penile pain?: No  Gastrointestinal Nausea?: No Vomiting?: No Indigestion/heartburn?: No Diarrhea?: No Constipation?: No  Constitutional Fever: No Night sweats?: No Weight loss?: No Fatigue?: No  Skin Skin rash/lesions?: No Itching?: No  Eyes Blurred vision?: No Double vision?: No  Ears/Nose/Throat Sore throat?: No Sinus problems?: No  Hematologic/Lymphatic Swollen glands?: No Easy bruising?: No  Cardiovascular Leg swelling?: No Chest pain?: No  Respiratory Cough?: No Shortness of breath?: No  Endocrine Excessive thirst?: No  Musculoskeletal Back pain?: No Joint pain?: No  Neurological Headaches?: No Dizziness?: No  Psychologic Depression?: No Anxiety?: No  Physical Exam: BP (!) 161/99 (BP Location: Left Arm, Patient Position: Sitting, Cuff Size: Normal)   Pulse 85   Ht 5' 8.5" (1.74 m)   Wt 157 lb (71.2 kg)   BMI 23.52 kg/m   Constitutional:  Alert and oriented, No acute distress. HEENT: [place] AT, moist mucus membranes.  Trachea midline, no masses. Cardiovascular: No clubbing, cyanosis, or edema. Respiratory: Normal  respiratory effort, no increased work of breathing. GI: Abdomen is soft, nontender, nondistended, no abdominal masses GU: No CVA tenderness Lymph: No cervical or inguinal lymphadenopathy. Skin: No rashes, bruises or suspicious lesions. Neurologic: Grossly intact, no focal deficits, moving all 4 extremities. Psychiatric: Normal mood and affect.   Assessment & Plan:   72 year old male with a mildly elevated PSA by strict criteria.  He does not desire  biopsy and wants to continue surveillance.  I did discuss the differentiation between low-grade and high-grade prostate cancer.  He will follow-up in 6 months for a 4Kscore.   Abbie Sons, [place] [address], [place], [place] [address] [phone]

## 2018-10-18 ENCOUNTER — Other Ambulatory Visit: Payer: Self-pay | Admitting: Unknown Physician Specialty

## 2018-11-12 ENCOUNTER — Encounter: Payer: Medicare HMO | Admitting: Family Medicine

## 2018-11-12 ENCOUNTER — Ambulatory Visit: Payer: Self-pay

## 2018-11-12 NOTE — Progress Notes (Deleted)
There were no vitals taken for this visit.   Subjective:    Patient ID: Ronnie Bullock, male    DOB: 21-Feb-1946, 73 y.o.   MRN: [id number]  HPI: Ronnie Bullock is a 73 y.o. male presenting on 11/12/2018 for comprehensive medical examination. Current medical complaints include:  HYPERTENSION / HYPERLIPIDEMIA Satisfied with current treatment? yes Duration of hypertension: chronic BP monitoring frequency: not checking BP medication side effects: no Past BP meds: amlodipine Duration of hyperlipidemia: chronic Cholesterol medication side effects: no Cholesterol supplements: none Past cholesterol medications: atorvastatin Medication compliance: good compliance Aspirin: no Recent stressors: {Blank single:19197::"yes","no"} Recurrent headaches: {Blank single:19197::"yes","no"} Visual changes: {Blank single:19197::"yes","no"} Palpitations: {Blank single:19197::"yes","no"} Dyspnea: {Blank single:19197::"yes","no"} Chest pain: {Blank single:19197::"yes","no"} Lower extremity edema: {Blank single:19197::"yes","no"} Dizzy/lightheaded: {Blank single:19197::"yes","no"}  He currently lives with: Interim Problems from his last visit: {Blank single:19197::"yes","no"}  Functional Status Survey:    FALL RISK: Fall Risk  11/10/2017 10/10/2017  Falls in the past year? No No    Depression Screen Depression screen [facility] 2/9 11/10/2017 10/10/2017  Decreased Interest 0 0  Down, Depressed, Hopeless 0 0  PHQ - 2 Score 0 0  Altered sleeping - 0  Tired, decreased energy - 0  Change in appetite - 0  Feeling bad or failure about yourself  - 0  Trouble concentrating - 0  Moving slowly or fidgety/restless - 0  Suicidal thoughts - 0  PHQ-9 Score - 0    Advanced Directives <no information>  Past Medical History:  Past Medical History:  Diagnosis Date  . Hyperlipidemia   . Hypertension     Surgical History:  Past Surgical History:  Procedure Laterality Date  . COLONOSCOPY WITH PROPOFOL  N/A 05/16/2018   Procedure: COLONOSCOPY WITH PROPOFOL;  Surgeon: Virgel Manifold, MD;  Location: [facility] ENDOSCOPY;  Service: Endoscopy;  Laterality: N/A;    Medications:  Current Outpatient Medications on File Prior to Visit  Medication Sig  . amLODipine (NORVASC) 5 MG tablet TAKE 1 TABLET BY MOUTH EVERY DAY  . atorvastatin (LIPITOR) 20 MG tablet Take 1 tablet (20 mg total) by mouth daily.   No current facility-administered medications on file prior to visit.     Allergies:  No Known Allergies  Social History:  Social History   Socioeconomic History  . Marital status: Widowed    Spouse name: Not on file  . Number of children: Not on file  . Years of education: Not on file  . Highest education level: Not on file  Occupational History  . Not on file  Social Needs  . Financial resource strain: Not hard at all  . Food insecurity:    Worry: Never true    Inability: Never true  . Transportation needs:    Medical: No    Non-medical: No  Tobacco Use  . Smoking status: Never Smoker  . Smokeless tobacco: Never Used  Substance and Sexual Activity  . Alcohol use: No    Alcohol/week: 0.0 standard drinks    Frequency: Never  . Drug use: No  . Sexual activity: Yes  Lifestyle  . Physical activity:    Days per week: 0 days    Minutes per session: 0 min  . Stress: Not at all  Relationships  . Social connections:    Talks on phone: More than three times a week    Gets together: Twice a week    Attends religious service: More than 4 times per year    Active member of club or organization: Patient  refused    Attends meetings of clubs or organizations: More than 4 times per year    Relationship status: Widowed  . Intimate partner violence:    Fear of current or ex partner: No    Emotionally abused: No    Physically abused: No    Forced sexual activity: No  Other Topics Concern  . Not on file  Social History Narrative  . Not on file   Social History   Tobacco Use    Smoking Status Never Smoker  Smokeless Tobacco Never Used   Social History   Substance and Sexual Activity  Alcohol Use No  . Alcohol/week: 0.0 standard drinks  . Frequency: Never    Family History:  Family History  Problem Relation Age of Onset  . Hypertension Mother   . Prostate cancer Neg Hx   . Bladder Cancer Neg Hx   . Kidney cancer Neg Hx     Past medical history, surgical history, medications, allergies, family history and social history reviewed with patient today and changes made to appropriate areas of the chart.   ROS  All other ROS negative except what is listed above and in the HPI.      Objective:    There were no vitals taken for this visit.  Wt Readings from Last 3 Encounters:  09/20/18 157 lb (71.2 kg)  06/14/18 153 lb (69.4 kg)  05/16/18 150 lb (68 kg)    Physical Exam  Cognitive Testing - 6-CIT  Correct? Score   What year is it? {YES NO:22349} {Numbers; 0-4:31231} Yes = 0    No = 4  What month is it? {YES NO:22349} {Numbers; 0-4:31231} Yes = 0    No = 3  Remember:     Ronnie Bullock, [place], [place]     What time is it? {YES NO:22349} {Numbers; 0-4:31231} Yes = 0    No = 3  Count backwards from 20 to 1 {YES NO:22349} {Numbers; 0-4:31231} Correct = 0    1 error = 2   More than 1 error = 4  Say the months of the year in reverse. {YES NO:22349} {Numbers; 0-4:31231} Correct = 0    1 error = 2   More than 1 error = 4  What address did I ask you to remember? {YES NO:22349} {NUMBERS; 0-10:5044} Correct = 0  1 error = 2    2 error = 4    3 error = 6    4 error = 8    All wrong = 10       TOTAL SCORE  {Numbers; 1-30:86578}/46   Interpretation:  {Desc; normal/abnormal:11317::"Normal"}  Normal (0-7) Abnormal (8-28)    Results for orders placed or performed in visit on 09/19/18  PSA  Result Value Ref Range   Prostate Specific Ag, Serum 4.7 (H) 0.0 - 4.0 ng/mL      Assessment & Plan:   Problem List Items Addressed This Visit       Cardiovascular and Mediastinum   Essential hypertension, benign     Other   Hypercholesteremia    Other Visit Diagnoses    Wellness examination    -  Primary   Routine general medical examination at a health care facility           Preventative Services:  Health Risk Assessment and Personalized Prevention Plan: Done today Bone Mass Measurements: N/A CVD Screening: Done today Colon Cancer Screening: Up to date Depression Screening: Done today Diabetes Screening:  Done today Glaucoma Screening: See your eye doctor Hepatitis B vaccine: N/A Hepatitis C screening: Done today HIV Screening: Up to date Flu Vaccine: Done today Lung cancer Screening: N/A Obesity Screening: Done today Pneumonia Vaccines (2): Done today STI Screening: N/A PSA screening: Done today  Discussed aspirin prophylaxis for myocardial infarction prevention and decision was made to start ASA  LABORATORY TESTING:  Health maintenance labs ordered today as discussed above.   The natural history of prostate cancer and ongoing controversy regarding screening and potential treatment outcomes of prostate cancer has been discussed with the patient. The meaning of a false positive PSA and a false negative PSA has been discussed. He indicates understanding of the limitations of this screening test and wishes to proceed with screening PSA testing.   IMMUNIZATIONS:   - Tdap: Tetanus vaccination status reviewed: {tetanus status:315746}. - Influenza: {Blank single:19197::"Up to date","Administered today","Postponed to flu season","Refused","Given elsewhere"} - Pneumovax: {Blank single:19197::"Up to date","Administered today","Not applicable","Refused","Given elsewhere"} - Prevnar: Up to date   SCREENING: - Colonoscopy: Up to date  Discussed with patient purpose of the colonoscopy is to detect colon cancer at curable precancerous or early stages   PATIENT COUNSELING:    Sexuality: Discussed sexually transmitted  diseases, partner selection, use of condoms, avoidance of unintended pregnancy  and contraceptive alternatives.   Advised to avoid cigarette smoking.  I discussed with the patient that most people either abstain from alcohol or drink within safe limits (<=14/week and <=4 drinks/occasion for males, <=7/weeks and <= 3 drinks/occasion for females) and that the risk for alcohol disorders and other health effects rises proportionally with the number of drinks per week and how often a drinker exceeds daily limits.  Discussed cessation/primary prevention of drug use and availability of treatment for abuse.   Diet: Encouraged to adjust caloric intake to maintain  or achieve ideal body weight, to reduce intake of dietary saturated fat and total fat, to limit sodium intake by avoiding high sodium foods and not adding table salt, and to maintain adequate dietary potassium and calcium preferably from fresh fruits, vegetables, and low-fat dairy products.    stressed the importance of regular exercise  Injury prevention: Discussed safety belts, safety helmets, smoke detector, smoking near bedding or upholstery.   Dental health: Discussed importance of regular tooth brushing, flossing, and dental visits.   Follow up plan: NEXT PREVENTATIVE PHYSICAL DUE IN 1 YEAR. No follow-ups on file.

## 2018-12-10 ENCOUNTER — Ambulatory Visit (INDEPENDENT_AMBULATORY_CARE_PROVIDER_SITE_OTHER): Payer: Medicare HMO

## 2018-12-10 VITALS — BP 114/70 | HR 84 | Temp 98.3°F | Resp 16 | Ht 69.0 in | Wt 152.1 lb

## 2018-12-10 DIAGNOSIS — Z23 Encounter for immunization: Secondary | ICD-10-CM | POA: Diagnosis not present

## 2018-12-10 DIAGNOSIS — Z0000 Encounter for general adult medical examination without abnormal findings: Secondary | ICD-10-CM | POA: Diagnosis not present

## 2018-12-10 NOTE — Patient Instructions (Signed)
Ronnie Bullock , Thank you for taking time to come for your Medicare Wellness Visit. I appreciate your ongoing commitment to your health goals. Please review the following plan we discussed and let me know if I can assist you in the future.   Screening recommendations/referrals: Colonoscopy: completed 05/16/2018 Recommended yearly ophthalmology/optometry visit for glaucoma screening and checkup Recommended yearly dental visit for hygiene and checkup  Vaccinations: Influenza vaccine: completed today Pneumococcal vaccine: completed series today  Tdap vaccine: due now- declined, please call if you have a cut or scrape Shingles vaccine: shingrix eligible, check with your insurance company for coverage   Advanced directives: Advance directive discussed with you today. I have provided a copy for you to complete at home and have notarized. Once this is complete please bring a copy in to our office so we can scan it into your chart.  Conditions/risks identified: none   Next appointment: Follow up in one year for your annual wellness exam.   Preventive Care 65 Years and Older, Male Preventive care refers to lifestyle choices and visits with your health care provider that can promote health and wellness. What does preventive care include?  A yearly physical exam. This is also called an annual well check.  Dental exams once or twice a year.  Routine eye exams. Ask your health care provider how often you should have your eyes checked.  Personal lifestyle choices, including:  Daily care of your teeth and gums.  Regular physical activity.  Eating a healthy diet.  Avoiding tobacco and drug use.  Limiting alcohol use.  Practicing safe sex.  Taking low doses of aspirin every day.  Taking vitamin and mineral supplements as recommended by your health care provider. What happens during an annual well check? The services and screenings done by your health care provider during your annual well  check will depend on your age, overall health, lifestyle risk factors, and family history of disease. Counseling  Your health care provider may ask you questions about your:  Alcohol use.  Tobacco use.  Drug use.  Emotional well-being.  Home and relationship well-being.  Sexual activity.  Eating habits.  History of falls.  Memory and ability to understand (cognition).  Work and work Statistician. Screening  You may have the following tests or measurements:  Height, weight, and BMI.  Blood pressure.  Lipid and cholesterol levels. These may be checked every 5 years, or more frequently if you are over 68 years old.  Skin check.  Lung cancer screening. You may have this screening every year starting at age 83 if you have a 30-pack-year history of smoking and currently smoke or have quit within the past 15 years.  Fecal occult blood test (FOBT) of the stool. You may have this test every year starting at age 49.  Flexible sigmoidoscopy or colonoscopy. You may have a sigmoidoscopy every 5 years or a colonoscopy every 10 years starting at age 32.  Prostate cancer screening. Recommendations will vary depending on your family history and other risks.  Hepatitis C blood test.  Hepatitis B blood test.  Sexually transmitted disease (STD) testing.  Diabetes screening. This is done by checking your blood sugar (glucose) after you have not eaten for a while (fasting). You may have this done every 1-3 years.  Abdominal aortic aneurysm (AAA) screening. You may need this if you are a current or former smoker.  Osteoporosis. You may be screened starting at age 44 if you are at high risk. Talk with your  health care provider about your test results, treatment options, and if necessary, the need for more tests. Vaccines  Your health care provider may recommend certain vaccines, such as:  Influenza vaccine. This is recommended every year.  Tetanus, diphtheria, and acellular  pertussis (Tdap, Td) vaccine. You may need a Td booster every 10 years.  Zoster vaccine. You may need this after age 103.  Pneumococcal 13-valent conjugate (PCV13) vaccine. One dose is recommended after age 51.  Pneumococcal polysaccharide (PPSV23) vaccine. One dose is recommended after age 34. Talk to your health care provider about which screenings and vaccines you need and how often you need them. This information is not intended to replace advice given to you by your health care provider. Make sure you discuss any questions you have with your health care provider. Document Released: 11/20/2015 Document Revised: 07/13/2016 Document Reviewed: 08/25/2015 Elsevier Interactive Patient Education  2017 [place] Prevention in the Home Falls can cause injuries. They can happen to people of all ages. There are many things you can do to make your home safe and to help prevent falls. What can I do on the outside of my home?  Regularly fix the edges of walkways and driveways and fix any cracks.  Remove anything that might make you trip as you walk through a door, such as a raised step or threshold.  Trim any bushes or trees on the path to your home.  Use bright outdoor lighting.  Clear any walking paths of anything that might make someone trip, such as rocks or tools.  Regularly check to see if handrails are loose or broken. Make sure that both sides of any steps have handrails.  Any raised decks and porches should have guardrails on the edges.  Have any leaves, snow, or ice cleared regularly.  Use sand or salt on walking paths during winter.  Clean up any spills in your garage right away. This includes oil or grease spills. What can I do in the bathroom?  Use night lights.  Install grab bars by the toilet and in the tub and shower. Do not use towel bars as grab bars.  Use non-skid mats or decals in the tub or shower.  If you need to sit down in the shower, use a plastic,  non-slip stool.  Keep the floor dry. Clean up any water that spills on the floor as soon as it happens.  Remove soap buildup in the tub or shower regularly.  Attach bath mats securely with double-sided non-slip rug tape.  Do not have throw rugs and other things on the floor that can make you trip. What can I do in the bedroom?  Use night lights.  Make sure that you have a light by your bed that is easy to reach.  Do not use any sheets or blankets that are too big for your bed. They should not hang down onto the floor.  Have a firm chair that has side arms. You can use this for support while you get dressed.  Do not have throw rugs and other things on the floor that can make you trip. What can I do in the kitchen?  Clean up any spills right away.  Avoid walking on wet floors.  Keep items that you use a lot in easy-to-reach places.  If you need to reach something above you, use a strong step stool that has a grab bar.  Keep electrical cords out of the way.  Do not use  floor polish or wax that makes floors slippery. If you must use wax, use non-skid floor wax.  Do not have throw rugs and other things on the floor that can make you trip. What can I do with my stairs?  Do not leave any items on the stairs.  Make sure that there are handrails on both sides of the stairs and use them. Fix handrails that are broken or loose. Make sure that handrails are as long as the stairways.  Check any carpeting to make sure that it is firmly attached to the stairs. Fix any carpet that is loose or worn.  Avoid having throw rugs at the top or bottom of the stairs. If you do have throw rugs, attach them to the floor with carpet tape.  Make sure that you have a light switch at the top of the stairs and the bottom of the stairs. If you do not have them, ask someone to add them for you. What else can I do to help prevent falls?  Wear shoes that:  Do not have high heels.  Have rubber  bottoms.  Are comfortable and fit you well.  Are closed at the toe. Do not wear sandals.  If you use a stepladder:  Make sure that it is fully opened. Do not climb a closed stepladder.  Make sure that both sides of the stepladder are locked into place.  Ask someone to hold it for you, if possible.  Clearly mark and make sure that you can see:  Any grab bars or handrails.  First and last steps.  Where the edge of each step is.  Use tools that help you move around (mobility aids) if they are needed. These include:  Canes.  Walkers.  Scooters.  Crutches.  Turn on the lights when you go into a dark area. Replace any light bulbs as soon as they burn out.  Set up your furniture so you have a clear path. Avoid moving your furniture around.  If any of your floors are uneven, fix them.  If there are any pets around you, be aware of where they are.  Review your medicines with your doctor. Some medicines can make you feel dizzy. This can increase your chance of falling. Ask your doctor what other things that you can do to help prevent falls. This information is not intended to replace advice given to you by your health care provider. Make sure you discuss any questions you have with your health care provider. Document Released: 08/20/2009 Document Revised: 03/31/2016 Document Reviewed: 11/28/2014 Elsevier Interactive Patient Education  2017 [employer].

## 2018-12-10 NOTE — Progress Notes (Signed)
Subjective:   Ronnie Bullock is a 73 y.o. male who presents for Medicare Annual/Subsequent preventive examination.  Review of Systems:   Cardiac Risk Factors include: advanced age (>87men, >67 women);hypertension;male gender     Objective:    Vitals: BP 114/70 (BP Location: Left Arm, Patient Position: Sitting, Cuff Size: Normal)   Pulse 84   Temp 98.3 F (36.8 C) (Temporal)   Resp 16   Ht 5\' 9"  (1.753 m)   Wt 152 lb 1.6 oz (69 kg)   BMI 22.46 kg/m   Body mass index is 22.46 kg/m.  Advanced Directives 12/10/2018 05/16/2018 11/10/2017 11/10/2017  Does Patient Have a Medical Advance Directive? No No No No  Would patient like information on creating a medical advance directive? Yes (MAU/Ambulatory/Procedural Areas - Information given) - Yes (MAU/Ambulatory/Procedural Areas - Information given) Yes (MAU/Ambulatory/Procedural Areas - Information given)    Tobacco Social History   Tobacco Use  Smoking Status Never Smoker  Smokeless Tobacco Never Used     Counseling given: Not Answered   Clinical Intake:  Pre-visit preparation completed: Yes  Pain : No/denies pain     Nutritional Status: BMI of 19-24  Normal Nutritional Risks: None Diabetes: No  How often do you need to have someone help you when you read instructions, pamphlets, or other written materials from your doctor or pharmacy?: 1 - Never What is the last grade level you completed in school?: 12th grade  Interpreter Needed?: No  Information entered by :: Tiffany Hill,LPN  Past Medical History:  Diagnosis Date  . Hyperlipidemia   . Hypertension    Past Surgical History:  Procedure Laterality Date  . COLONOSCOPY WITH PROPOFOL N/A 05/16/2018   Procedure: COLONOSCOPY WITH PROPOFOL;  Surgeon: Virgel Manifold, MD;  Location: [facility] ENDOSCOPY;  Service: Endoscopy;  Laterality: N/A;   Family History  Problem Relation Age of Onset  . Hypertension Mother   . Prostate cancer Neg Hx   . Bladder Cancer Neg Hx    . Kidney cancer Neg Hx    Social History   Socioeconomic History  . Marital status: Widowed    Spouse name: Not on file  . Number of children: Not on file  . Years of education: Not on file  . Highest education level: 12th grade  Occupational History  . Not on file  Social Needs  . Financial resource strain: Not hard at all  . Food insecurity:    Worry: Never true    Inability: Never true  . Transportation needs:    Medical: No    Non-medical: No  Tobacco Use  . Smoking status: Never Smoker  . Smokeless tobacco: Never Used  Substance and Sexual Activity  . Alcohol use: No    Alcohol/week: 0.0 standard drinks    Frequency: Never  . Drug use: No  . Sexual activity: Yes  Lifestyle  . Physical activity:    Days per week: 0 days    Minutes per session: 0 min  . Stress: Not at all  Relationships  . Social connections:    Talks on phone: More than three times a week    Gets together: Twice a week    Attends religious service: More than 4 times per year    Active member of club or organization: Patient refused    Attends meetings of clubs or organizations: More than 4 times per year    Relationship status: Widowed  Other Topics Concern  . Not on file  Social History  Narrative  . Not on file    Outpatient Encounter Medications as of 12/10/2018  Medication Sig  . amLODipine (NORVASC) 5 MG tablet TAKE 1 TABLET BY MOUTH EVERY DAY  . [DISCONTINUED] atorvastatin (LIPITOR) 20 MG tablet Take 1 tablet (20 mg total) by mouth daily. (Patient not taking: Reported on 12/10/2018)   No facility-administered encounter medications on file as of 12/10/2018.     Activities of Daily Living In your present state of health, do you have any difficulty performing the following activities: 12/10/2018  Hearing? N  Vision? N  Difficulty concentrating or making decisions? N  Walking or climbing stairs? N  Dressing or bathing? N  Doing errands, shopping? N  Preparing Food and eating ? N  Using  the Toilet? N  In the past six months, have you accidently leaked urine? N  Do you have problems with loss of bowel control? N  Managing your Medications? N  Managing your Finances? N  Housekeeping or managing your Housekeeping? N  Some recent data might be hidden    Patient Care Team: Guadalupe Maple, MD as PCP - General (Family Medicine) Abbie Sons, MD (Urology)   Assessment:   This is a routine wellness examination for [place].  Exercise Activities and Dietary recommendations Exercise limited by: None identified  Goals    . DIET - INCREASE WATER INTAKE     Recommend drinking at least 6-8 glasses of water a day        Fall Risk Fall Risk  12/10/2018 11/10/2017 10/10/2017  Falls in the past year? 0 No No  Number falls in past yr: 0 - -   FALL RISK PREVENTION PERTAINING TO THE HOME:  Any stairs in or around the home WITH handrails? Yes  Home free of loose throw rugs in walkways, pet beds, electrical cords, etc? Yes  Adequate lighting in your home to reduce risk of falls? Yes   ASSISTIVE DEVICES UTILIZED TO PREVENT FALLS:  Life alert? No  Use of a cane, walker or w/c? No  Grab bars in the bathroom? no Shower chair or bench in shower? No  Elevated toilet seat or a handicapped toilet? No   DME ORDERS:  DME order needed?  No   TIMED UP AND GO:  Was the test performed? No .  GAIT:  Appearance of gait: Gait stead-fast with the use of an assistive device.  Education: Fall risk prevention has been discussed.  Intervention(s) required? No    Depression Screen PHQ 2/9 Scores 12/10/2018 11/10/2017 10/10/2017  PHQ - 2 Score 0 0 0  PHQ- 9 Score - - 0    Cognitive Function     6CIT Screen 12/10/2018 11/10/2017  What Year? 0 points 0 points  What month? 0 points 0 points  What time? 0 points 0 points  Count back from 20 0 points 0 points  Months in reverse 0 points 0 points  Repeat phrase 8 points 10 points  Total Score 8 10    Immunization History  Administered  Date(s) Administered  . Influenza, High Dose Seasonal PF 10/10/2017, 12/10/2018  . Pneumococcal Conjugate-13 10/10/2017  . Pneumococcal Polysaccharide-23 12/10/2018    Qualifies for Shingles Vaccine? Yes  Due for Shingrix. Education has been provided regarding the importance of this vaccine. Pt has been advised to call insurance company to determine out of pocket expense. Advised may also receive vaccine at local pharmacy or Health Dept. Verbalized acceptance and understanding.  Tdap: Although this vaccine is not a  covered service during a Wellness Exam, does the patient still wish to receive this vaccine today?  No .  Education has been provided regarding the importance of this vaccine. Advised may receive this vaccine at local pharmacy or Health Dept. Aware to provide a copy of the vaccination record if obtained from local pharmacy or Health Dept. Verbalized acceptance and understanding.  Flu Vaccine: Due for Flu vaccine. Does the patient want to receive this vaccine today?  Yes .   Pneumococcal Vaccine: Due for Pneumococcal vaccine. Does the patient want to receive this vaccine today?  Yes .   Screening Tests Health Maintenance  Topic Date Due  . Hepatitis C Screening  1946/04/11  . INFLUENZA VACCINE  06/07/2018  . PNA vac Low Risk Adult (2 of 2 - PPSV23) 10/10/2018  . TETANUS/TDAP  12/11/2019 (Originally 11/17/1964)  . COLONOSCOPY  05/17/2023  Cancer Screenings:  Colorectal Screening: Completed 05/16/2018. Repeat every 5 years   Lung Cancer Screening: (Low Dose CT Chest recommended if Age 36-80 years, 30 pack-year currently smoking OR have quit w/in 15years.) does not qualify.   Lung Cancer Screening Referral: An Epic message has been sent to Burgess Estelle, RN (Oncology Nurse Navigator) regarding the possible need for this exam. Raquel Sarna will review the patient's chart to determine if the patient truly qualifies for the exam. If the patient qualifies, Raquel Sarna will order the Low Dose CT of  the chest to facilitate the scheduling of this exam.  Additional Screening:  Hepatitis C Screening: does qualify; ordered  Vision Screening: Recommended annual ophthalmology exams for early detection of glaucoma and other disorders of the eye. Is the patient up to date with their annual eye exam?  Yes  Who is the provider or what is the name of the office in which the pt attends annual eye exams?  Dental Screening: Recommended annual dental exams for proper oral hygiene  Community Resource Referral:  CRR required this visit?  No     Plan:    I have personally reviewed and addressed the Medicare Annual Wellness questionnaire and have noted the following in the patient's chart:  A. Medical and social history B. Use of alcohol, tobacco or illicit drugs  C. Current medications and supplements D. Functional ability and status E.  Nutritional status F.  Physical activity G. Advance directives H. List of other physicians I.  Hospitalizations, surgeries, and ER visits in previous 12 months J.  [place] such as hearing and vision if needed, cognitive and depression L. Referrals and appointments   In addition, I have reviewed and discussed with patient certain preventive protocols, quality metrics, and best practice recommendations. A written personalized care plan for preventive services as well as general preventive health recommendations were provided to patient.   Signed,  Tyler Aas, LPN Nurse Health Advisor   Nurse Notes:none

## 2019-01-01 ENCOUNTER — Encounter: Payer: Self-pay | Admitting: Family Medicine

## 2019-01-01 ENCOUNTER — Ambulatory Visit (INDEPENDENT_AMBULATORY_CARE_PROVIDER_SITE_OTHER): Payer: Medicare HMO | Admitting: Family Medicine

## 2019-01-01 VITALS — BP 138/84 | HR 80 | Temp 98.5°F | Ht 69.0 in | Wt 155.0 lb

## 2019-01-01 DIAGNOSIS — R972 Elevated prostate specific antigen [PSA]: Secondary | ICD-10-CM | POA: Diagnosis not present

## 2019-01-01 DIAGNOSIS — Z23 Encounter for immunization: Secondary | ICD-10-CM

## 2019-01-01 DIAGNOSIS — S41101A Unspecified open wound of right upper arm, initial encounter: Secondary | ICD-10-CM

## 2019-01-01 DIAGNOSIS — Z1159 Encounter for screening for other viral diseases: Secondary | ICD-10-CM

## 2019-01-01 DIAGNOSIS — Z0000 Encounter for general adult medical examination without abnormal findings: Secondary | ICD-10-CM | POA: Diagnosis not present

## 2019-01-01 DIAGNOSIS — Z7189 Other specified counseling: Secondary | ICD-10-CM | POA: Diagnosis not present

## 2019-01-01 DIAGNOSIS — I499 Cardiac arrhythmia, unspecified: Secondary | ICD-10-CM

## 2019-01-01 DIAGNOSIS — I10 Essential (primary) hypertension: Secondary | ICD-10-CM

## 2019-01-01 DIAGNOSIS — E7800 Pure hypercholesterolemia, unspecified: Secondary | ICD-10-CM | POA: Diagnosis not present

## 2019-01-01 LAB — UA/M W/RFLX CULTURE, ROUTINE
Bilirubin, UA: NEGATIVE
Glucose, UA: NEGATIVE
Leukocytes, UA: NEGATIVE
NITRITE UA: NEGATIVE
RBC, UA: NEGATIVE
Specific Gravity, UA: 1.025 (ref 1.005–1.030)
UUROB: 0.2 mg/dL (ref 0.2–1.0)
pH, UA: 5.5 (ref 5.0–7.5)

## 2019-01-01 LAB — MICROALBUMIN, URINE WAIVED
Creatinine, Urine Waived: 200 mg/dL (ref 10–300)
MICROALB, UR WAIVED: 150 mg/L — AB (ref 0–19)

## 2019-01-01 LAB — MICROSCOPIC EXAMINATION
Bacteria, UA: NONE SEEN
RBC, UA: NONE SEEN /hpf (ref 0–2)
WBC UA: NONE SEEN /HPF (ref 0–5)

## 2019-01-01 MED ORDER — AMLODIPINE BESYLATE 5 MG PO TABS: 5.0000 mg | ORAL_TABLET | Freq: Every day | ORAL | 1 refills | Status: DC

## 2019-01-01 NOTE — Patient Instructions (Addendum)
Health Maintenance After Age 74 After age 74, you are at a higher risk for certain long-term diseases and infections as well as injuries from falls. Falls are a major cause of broken bones and head injuries in people who are older than age 74. Getting regular preventive care can help to keep you healthy and well. Preventive care includes getting regular testing and making lifestyle changes as recommended by your health care provider. Talk with your health care provider about:  Which screenings and tests you should have. A screening is a test that checks for a disease when you have no symptoms.  A diet and exercise plan that is right for you. What should I know about screenings and tests to prevent falls? Screening and testing are the best ways to find a health problem early. Early diagnosis and treatment give you the best chance of managing medical conditions that are common after age 74. Certain conditions and lifestyle choices may make you more likely to have a fall. Your health care provider may recommend:  Regular vision checks. Poor vision and conditions such as cataracts can make you more likely to have a fall. If you wear glasses, make sure to get your prescription updated if your vision changes.  Medicine review. Work with your health care provider to regularly review all of the medicines you are taking, including over-the-counter medicines. Ask your health care provider about any side effects that may make you more likely to have a fall. Tell your health care provider if any medicines that you take make you feel dizzy or sleepy.  Osteoporosis screening. Osteoporosis is a condition that causes the bones to get weaker. This can make the bones weak and cause them to break more easily.  Blood pressure screening. Blood pressure changes and medicines to control blood pressure can make you feel dizzy.  Strength and balance checks. Your health care provider may recommend certain tests to check your  strength and balance while standing, walking, or changing positions.  Foot health exam. Foot pain and numbness, as well as not wearing proper footwear, can make you more likely to have a fall.  Depression screening. You may be more likely to have a fall if you have a fear of falling, feel emotionally low, or feel unable to do activities that you used to do.  Alcohol use screening. Using too much alcohol can affect your balance and may make you more likely to have a fall. What actions can I take to lower my risk of falls? General instructions  Talk with your health care provider about your risks for falling. Tell your health care provider if: ? You fall. Be sure to tell your health care provider about all falls, even ones that seem minor. ? You feel dizzy, sleepy, or off-balance.  Take over-the-counter and prescription medicines only as told by your health care provider. These include any supplements.  Eat a healthy diet and maintain a healthy weight. A healthy diet includes low-fat dairy products, low-fat (lean) meats, and fiber from whole grains, beans, and lots of fruits and vegetables. Home safety  Remove any tripping hazards, such as rugs, cords, and clutter.  Install safety equipment such as grab bars in bathrooms and safety rails on stairs.  Keep rooms and walkways well-lit. Activity   Follow a regular exercise program to stay fit. This will help you maintain your balance. Ask your health care provider what types of exercise are appropriate for you.  If you need a cane or   walker, use it as recommended by your health care provider.  Wear supportive shoes that have nonskid soles. Lifestyle  Do not drink alcohol if your health care provider tells you not to drink.  If you drink alcohol, limit how much you have: ? 0-1 drink a day for women. ? 0-2 drinks a day for men.  Be aware of how much alcohol is in your drink. In the [place] one drink equals one typical bottle of beer (12  oz), one-half glass of wine (5 oz), or one shot of hard liquor (1 oz).  Do not use any products that contain nicotine or tobacco, such as cigarettes and e-cigarettes. If you need help quitting, ask your health care provider. Summary  Having a healthy lifestyle and getting preventive care can help to protect your health and wellness after age 74.  Screening and testing are the best way to find a health problem early and help you avoid having a fall. Early diagnosis and treatment give you the best chance for managing medical conditions that are more common for people who are older than age 74.  Falls are a major cause of broken bones and head injuries in people who are older than age 74. Take precautions to prevent a fall at home.  Work with your health care provider to learn what changes you can make to improve your health and wellness and to prevent falls. This information is not intended to replace advice given to you by your health care provider. Make sure you discuss any questions you have with your health care provider. Document Released: 09/06/2017 Document Revised: 09/06/2017 Document Reviewed: 09/06/2017 Elsevier Interactive Patient Education  2019 [employer] Td Vaccine (Tetanus and Diphtheria): What You Need to Know 1. Why get vaccinated? Tetanus  and diphtheria are very serious diseases. They are rare in the [place] today, but people who do become infected often have severe complications. Td vaccine is used to protect adolescents and adults from both of these diseases. Both tetanus and diphtheria are infections caused by bacteria. Diphtheria spreads from person to person through coughing or sneezing. Tetanus-causing bacteria enter the body through cuts, scratches, or wounds. TETANUS (Lockjaw) causes painful muscle tightening and stiffness, usually all over the body.  It can lead to tightening of muscles in the head and neck so you can't open your mouth, swallow, or sometimes  even breathe. Tetanus kills about 1 out of every 10 people who are infected even after receiving the best medical care. DIPHTHERIA can cause a thick coating to form in the back of the throat.  It can lead to breathing problems, paralysis, heart failure, and death. Before vaccines, as many as 200,000 cases of diphtheria and hundreds of cases of tetanus were reported in the [place] each year. Since vaccination began, reports of cases for both diseases have dropped by about 99%. 2. Td vaccine Td vaccine can protect adolescents and adults from tetanus and diphtheria. Td is usually given as a booster dose every 10 years but it can also be given earlier after a severe and dirty wound or burn. Another vaccine, called Tdap, which protects against pertussis in addition to tetanus and diphtheria, is sometimes recommended instead of Td vaccine. Your doctor or the person giving you the vaccine can give you more information. Td may safely be given at the same time as other vaccines. 3. Some people should not get this vaccine  A person who has ever had a life-threatening allergic reaction after a previous   dose of any tetanus or diphtheria containing vaccine, OR has a severe allergy to any part of this vaccine, should not get Td vaccine. Tell the person giving the vaccine about any severe allergies.  Talk to your doctor if you: ? had severe pain or swelling after any vaccine containing diphtheria or tetanus, ? ever had a condition called Guillain Barr Syndrome (GBS), ? aren't feeling well on the day the shot is scheduled. 4. Risks of a vaccine reaction With any medicine, including vaccines, there is a chance of side effects. These are usually mild and go away on their own. Serious reactions are also possible but are rare. Most people who get Td vaccine do not have any problems with it. Mild Problems following Td vaccine: (Did not interfere with activities)  Pain where the shot was given (about 8  people in 10)  Redness or swelling where the shot was given (about 1 person in 4)  Mild fever (rare)  Headache (about 1 person in 4)  Tiredness (about 1 person in 4) Moderate Problems following Td vaccine: (Interfered with activities, but did not require medical attention)  Fever over 102F (rare) Severe Problems following Td vaccine: (Unable to perform usual activities; required medical attention)  Swelling, severe pain, bleeding and/or redness in the arm where the shot was given (rare). Problems that could happen after any vaccine:  People sometimes faint after a medical procedure, including vaccination. Sitting or lying down for about 15 minutes can help prevent fainting, and injuries caused by a fall. Tell your doctor if you feel dizzy, or have vision changes or ringing in the ears.  Some people get severe pain in the shoulder and have difficulty moving the arm where a shot was given. This happens very rarely.  Any medication can cause a severe allergic reaction. Such reactions from a vaccine are very rare, estimated at fewer than 1 in a million doses, and would happen within a few minutes to a few hours after the vaccination. As with any medicine, there is a very remote chance of a vaccine causing a serious injury or death. The safety of vaccines is always being monitored. For more information, visit: www.cdc.gov/vaccinesafety/ 5. What if there is a serious reaction? What should I look for?  Look for anything that concerns you, such as signs of a severe allergic reaction, very high fever, or unusual behavior. Signs of a severe allergic reaction can include hives, swelling of the face and throat, difficulty breathing, a fast heartbeat, dizziness, and weakness. These would usually start a few minutes to a few hours after the vaccination. What should I do?  If you think it is a severe allergic reaction or other emergency that can't wait, call 9-1-1 or get the person to the nearest  hospital. Otherwise, call your doctor.  Afterward, the reaction should be reported to the Vaccine Adverse Event Reporting System (VAERS). Your doctor might file this report, or you can do it yourself through the VAERS web site at www.vaers.hhs.gov, or by calling 1-800-822-7967. VAERS does not give medical advice. 6. The National Vaccine Injury Compensation Program The National Vaccine Injury Compensation Program (VICP) is a federal program that was created to compensate people who may have been injured by certain vaccines. Persons who believe they may have been injured by a vaccine can learn about the program and about filing a claim by calling 1-800-338-2382 or visiting the VICP website at www.hrsa.gov/vaccinecompensation. There is a time limit to file a claim for compensation. 7. How   can I learn more?  Ask your doctor. He or she can give you the vaccine package insert or suggest other sources of information.  Call your local or state health department.  Contact the Centers for Disease Control and Prevention (CDC): ? Call 1-800-232-4636 (1-800-CDC-INFO) ? Visit CDC's website at www.cdc.gov/vaccines Vaccine Information Statement Td Vaccine (02/16/16) This information is not intended to replace advice given to you by your health care provider. Make sure you discuss any questions you have with your health care provider. Document Released: 08/21/2006 Document Revised: 06/11/2018 Document Reviewed: 06/11/2018 Elsevier Interactive Patient Education  2019 Elsevier Inc.  

## 2019-01-01 NOTE — Assessment & Plan Note (Signed)
Under good control on current regimen. Continue current regimen. Continue to monitor. Call with any concerns. Refills given. Labs drawn today.   

## 2019-01-01 NOTE — Progress Notes (Signed)
BP 138/84   Pulse 80   Temp 98.5 F (36.9 C) (Oral)   Ht 5\' 9"  (1.753 m)   Wt 155 lb (70.3 kg)   SpO2 96%   BMI 22.89 kg/m    Subjective:    Patient ID: Ronnie Bullock, male    DOB: November 04, 1946, 73 y.o.   MRN: [id number]  HPI: Ronnie Bullock is a 73 y.o. male presenting on 01/01/2019 for comprehensive medical examination. Current medical complaints include:  HYPERTENSION / HYPERLIPIDEMIA Satisfied with current treatment? yes Duration of hypertension: chronic BP monitoring frequency: not checking BP medication side effects: no Past BP meds: amlopidine  Duration of hyperlipidemia: chronic Cholesterol medication side effects: not on anything Cholesterol supplements: none Past cholesterol medications: none Medication compliance: excellent compliance Aspirin: no Recent stressors: no Recurrent headaches: no Visual changes: no Palpitations: no Dyspnea: no Chest pain: no Lower extremity edema: no Dizzy/lightheaded: no  Interim Problems from his last visit: no  Depression Screen done today and results listed below:  Depression screen [facility] 2/9 12/10/2018 11/10/2017 10/10/2017  Decreased Interest 0 0 0  Down, Depressed, Hopeless 0 0 0  PHQ - 2 Score 0 0 0  Altered sleeping - - 0  Tired, decreased energy - - 0  Change in appetite - - 0  Feeling bad or failure about yourself  - - 0  Trouble concentrating - - 0  Moving slowly or fidgety/restless - - 0  Suicidal thoughts - - 0  PHQ-9 Score - - 0    Past Medical History:  Past Medical History:  Diagnosis Date  . Hyperlipidemia   . Hypertension     Surgical History:  Past Surgical History:  Procedure Laterality Date  . COLONOSCOPY WITH PROPOFOL N/A 05/16/2018   Procedure: COLONOSCOPY WITH PROPOFOL;  Surgeon: Virgel Manifold, MD;  Location: [facility] ENDOSCOPY;  Service: Endoscopy;  Laterality: N/A;    Medications:  No current outpatient medications on file prior to visit.   No current facility-administered  medications on file prior to visit.     Allergies:  No Known Allergies  Social History:  Social History   Socioeconomic History  . Marital status: Widowed    Spouse name: Not on file  . Number of children: Not on file  . Years of education: Not on file  . Highest education level: 12th grade  Occupational History  . Not on file  Social Needs  . Financial resource strain: Not hard at all  . Food insecurity:    Worry: Never true    Inability: Never true  . Transportation needs:    Medical: No    Non-medical: No  Tobacco Use  . Smoking status: Never Smoker  . Smokeless tobacco: Never Used  Substance and Sexual Activity  . Alcohol use: No    Alcohol/week: 0.0 standard drinks    Frequency: Never  . Drug use: No  . Sexual activity: Yes  Lifestyle  . Physical activity:    Days per week: 0 days    Minutes per session: 0 min  . Stress: Not at all  Relationships  . Social connections:    Talks on phone: More than three times a week    Gets together: Twice a week    Attends religious service: More than 4 times per year    Active member of club or organization: Patient refused    Attends meetings of clubs or organizations: More than 4 times per year    Relationship status: Widowed  .  Intimate partner violence:    Fear of current or ex partner: No    Emotionally abused: No    Physically abused: No    Forced sexual activity: No  Other Topics Concern  . Not on file  Social History Narrative  . Not on file   Social History   Tobacco Use  Smoking Status Never Smoker  Smokeless Tobacco Never Used   Social History   Substance and Sexual Activity  Alcohol Use No  . Alcohol/week: 0.0 standard drinks  . Frequency: Never    Family History:  Family History  Problem Relation Age of Onset  . Hypertension Mother   . Prostate cancer Neg Hx   . Bladder Cancer Neg Hx   . Kidney cancer Neg Hx     Past medical history, surgical history, medications, allergies, family  history and social history reviewed with patient today and changes made to appropriate areas of the chart.   Review of Systems  Constitutional: Negative.   HENT: Negative.   Eyes: Negative.   Respiratory: Negative.   Cardiovascular: Negative.   Gastrointestinal: Negative.   Genitourinary: Negative.   Musculoskeletal: Negative.   Skin: Negative.   Neurological: Negative.   Endo/Heme/Allergies: Negative.   Psychiatric/Behavioral: Negative.     All other ROS negative except what is listed above and in the HPI.      Objective:    BP 138/84   Pulse 80   Temp 98.5 F (36.9 C) (Oral)   Ht 5\' 9"  (1.753 m)   Wt 155 lb (70.3 kg)   SpO2 96%   BMI 22.89 kg/m   Wt Readings from Last 3 Encounters:  01/01/19 155 lb (70.3 kg)  12/10/18 152 lb 1.6 oz (69 kg)  09/20/18 157 lb (71.2 kg)    Physical Exam Vitals signs and nursing note reviewed.  Constitutional:      General: He is not in acute distress.    Appearance: Normal appearance. He is obese. He is not ill-appearing, toxic-appearing or diaphoretic.  HENT:     Head: Normocephalic and atraumatic.     Right Ear: Tympanic membrane, ear canal and external ear normal. There is no impacted cerumen.     Left Ear: Tympanic membrane, ear canal and external ear normal. There is no impacted cerumen.     Nose: Nose normal. No congestion or rhinorrhea.     Mouth/Throat:     Mouth: Mucous membranes are moist.     Pharynx: Oropharynx is clear. No oropharyngeal exudate or posterior oropharyngeal erythema.  Eyes:     General: No scleral icterus.       Right eye: No discharge.        Left eye: No discharge.     Extraocular Movements: Extraocular movements intact.     Conjunctiva/sclera: Conjunctivae normal.     Pupils: Pupils are equal, round, and reactive to light.  Neck:     Musculoskeletal: Normal range of motion and neck supple. No neck rigidity or muscular tenderness.     Vascular: No carotid bruit.  Cardiovascular:     Rate and  Rhythm: Normal rate. Rhythm irregular.     Pulses: Normal pulses.     Heart sounds: No murmur. No friction rub. No gallop.   Pulmonary:     Effort: Pulmonary effort is normal. No respiratory distress.     Breath sounds: Normal breath sounds. No stridor. No wheezing, rhonchi or rales.  Chest:     Chest wall: No tenderness.  Abdominal:  General: Abdomen is flat. Bowel sounds are normal. There is no distension.     Palpations: Abdomen is soft. There is no mass.     Tenderness: There is no abdominal tenderness. There is no right CVA tenderness, left CVA tenderness, guarding or rebound.     Hernia: No hernia is present.  Genitourinary:    Comments: Genital exam deferred with shared decision making Musculoskeletal:        General: No swelling, tenderness, deformity or signs of injury.     Right lower leg: No edema.     Left lower leg: No edema.  Lymphadenopathy:     Cervical: No cervical adenopathy.  Skin:    General: Skin is warm and dry.     Capillary Refill: Capillary refill takes less than 2 seconds.     Coloration: Skin is not jaundiced or pale.     Findings: No bruising, erythema, lesion or rash.  Neurological:     General: No focal deficit present.     Mental Status: He is alert and oriented to person, place, and time.     Cranial Nerves: No cranial nerve deficit.     Sensory: No sensory deficit.     Motor: No weakness.     Coordination: Coordination normal.     Gait: Gait normal.     Deep Tendon Reflexes: Reflexes normal.  Psychiatric:        Mood and Affect: Mood normal.        Behavior: Behavior normal.        Thought Content: Thought content normal.        Judgment: Judgment normal.     Results for orders placed or performed in visit on 09/19/18  PSA  Result Value Ref Range   Prostate Specific Ag, Serum 4.7 (H) 0.0 - 4.0 ng/mL      Assessment & Plan:   Problem List Items Addressed This Visit      Cardiovascular and Mediastinum   Essential hypertension,  benign    Under good control on current regimen. Continue current regimen. Continue to monitor. Call with any concerns. Refills given. Labs drawn today.       Relevant Medications   amLODipine (NORVASC) 5 MG tablet   Other Relevant Orders   CBC with Differential/Platelet   Comprehensive metabolic panel   Microalbumin, Urine Waived   TSH   UA/M w/rflx Culture, Routine     Other   Advanced care planning/counseling discussion    Does not wish to make changes today. Information provided to patient.      Hypercholesteremia    Under good control on current regimen. Continue current regimen. Continue to monitor. Call with any concerns. Refills given. Labs drawn today.      Relevant Medications   amLODipine (NORVASC) 5 MG tablet   Other Relevant Orders   Comprehensive metabolic panel   Lipid Panel w/o Chol/HDL Ratio    Other Visit Diagnoses    Routine general medical examination at a health care facility    -  Primary   Vaccines updated. Screening labs checked today. Colonoscopy up to date. Continue diet and exercise. Call with any concerns.    Need for hepatitis C screening test       Labs drawn today. Await results.    Relevant Orders   Hepatitis C Antibody   Elevated PSA       Due for recheck. Labs drawn today.   Relevant Orders   PSA   Open wound of right  upper arm, initial encounter       Healing well, but due for Td- given today.   Relevant Orders   Td : Tetanus/diphtheria >7yo Preservative  free   Irregular heart beat       PVC on EKG- likely the cause of the irregular beat. Otherwise normal. Continue to monitor. Checking labs.   Relevant Orders   EKG 12-Lead (Completed)       LABORATORY TESTING:  Health maintenance labs ordered today as discussed above.   The natural history of prostate cancer and ongoing controversy regarding screening and potential treatment outcomes of prostate cancer has been discussed with the patient. The meaning of a false positive PSA and  a false negative PSA has been discussed. He indicates understanding of the limitations of this screening test and wishes to proceed with screening PSA testing.   IMMUNIZATIONS:   - Tdap: Tetanus vaccination status reviewed: Td vaccination indicated and given today. - Influenza: Up to date - Pneumovax: Up to date - Prevnar: Up to date - HPV: Not applicable - Zostavax vaccine: Not applicable  SCREENING: - Colonoscopy: Up to date  Discussed with patient purpose of the colonoscopy is to detect colon cancer at curable precancerous or early stages   PATIENT COUNSELING:    Sexuality: Discussed sexually transmitted diseases, partner selection, use of condoms, avoidance of unintended pregnancy  and contraceptive alternatives.   Advised to avoid cigarette smoking.  I discussed with the patient that most people either abstain from alcohol or drink within safe limits (<=14/week and <=4 drinks/occasion for males, <=7/weeks and <= 3 drinks/occasion for females) and that the risk for alcohol disorders and other health effects rises proportionally with the number of drinks per week and how often a drinker exceeds daily limits.  Discussed cessation/primary prevention of drug use and availability of treatment for abuse.   Diet: Encouraged to adjust caloric intake to maintain  or achieve ideal body weight, to reduce intake of dietary saturated fat and total fat, to limit sodium intake by avoiding high sodium foods and not adding table salt, and to maintain adequate dietary potassium and calcium preferably from fresh fruits, vegetables, and low-fat dairy products.    stressed the importance of regular exercise  Injury prevention: Discussed safety belts, safety helmets, smoke detector, smoking near bedding or upholstery.   Dental health: Discussed importance of regular tooth brushing, flossing, and dental visits.   Follow up plan: NEXT PREVENTATIVE PHYSICAL DUE IN 1 YEAR. Return in about 6 months  (around 07/02/2019) for follow up.

## 2019-01-01 NOTE — Assessment & Plan Note (Signed)
Does not wish to make changes today. Information provided to patient.

## 2019-01-02 LAB — COMPREHENSIVE METABOLIC PANEL
A/G RATIO: 1.1 — AB (ref 1.2–2.2)
ALK PHOS: 80 IU/L (ref 39–117)
ALT: 10 IU/L (ref 0–44)
AST: 24 IU/L (ref 0–40)
Albumin: 3.7 g/dL (ref 3.7–4.7)
BUN / CREAT RATIO: 16 (ref 10–24)
BUN: 21 mg/dL (ref 8–27)
Bilirubin Total: 0.4 mg/dL (ref 0.0–1.2)
CHLORIDE: 101 mmol/L (ref 96–106)
CO2: 23 mmol/L (ref 20–29)
CREATININE: 1.3 mg/dL — AB (ref 0.76–1.27)
Calcium: 9.4 mg/dL (ref 8.6–10.2)
GFR calc Af Amer: 62 mL/min/{1.73_m2} (ref >59)
GFR, EST NON AFRICAN AMERICAN: 54 mL/min/{1.73_m2} — AB (ref >59)
GLOBULIN, TOTAL: 3.3 g/dL (ref 1.5–4.5)
Glucose: 87 mg/dL (ref 65–99)
Potassium: 4.7 mmol/L (ref 3.5–5.2)
SODIUM: 140 mmol/L (ref 134–144)
Total Protein: 7 g/dL (ref 6.0–8.5)

## 2019-01-02 LAB — CBC WITH DIFFERENTIAL/PLATELET
BASOS: 1 %
Basophils Absolute: 0 10*3/uL (ref 0.0–0.2)
EOS (ABSOLUTE): 0.3 10*3/uL (ref 0.0–0.4)
EOS: 8 %
HEMATOCRIT: 42.8 % (ref 37.5–51.0)
Hemoglobin: 14.5 g/dL (ref 13.0–17.7)
Immature Grans (Abs): 0 10*3/uL (ref 0.0–0.1)
Immature Granulocytes: 0 %
LYMPHS ABS: 1.4 10*3/uL (ref 0.7–3.1)
Lymphs: 34 %
MCH: 29.9 pg (ref 26.6–33.0)
MCHC: 33.9 g/dL (ref 31.5–35.7)
MCV: 88 fL (ref 79–97)
MONOCYTES: 13 %
MONOS ABS: 0.5 10*3/uL (ref 0.1–0.9)
NEUTROS ABS: 1.8 10*3/uL (ref 1.4–7.0)
Neutrophils: 44 %
Platelets: 213 10*3/uL (ref 150–450)
RBC: 4.85 x10E6/uL (ref 4.14–5.80)
RDW: 13.8 % (ref 11.6–15.4)
WBC: 4 10*3/uL (ref 3.4–10.8)

## 2019-01-02 LAB — LIPID PANEL W/O CHOL/HDL RATIO
CHOLESTEROL TOTAL: 171 mg/dL (ref 100–199)
HDL: 63 mg/dL (ref >39)
LDL Calculated: 95 mg/dL (ref 0–99)
Triglycerides: 66 mg/dL (ref 0–149)
VLDL Cholesterol Cal: 13 mg/dL (ref 5–40)

## 2019-01-02 LAB — TSH: TSH: 1.05 u[IU]/mL (ref 0.450–4.500)

## 2019-01-02 LAB — PSA: Prostate Specific Ag, Serum: 5.7 ng/mL — ABNORMAL HIGH (ref 0.0–4.0)

## 2019-01-02 LAB — HEPATITIS C ANTIBODY: Hep C Virus Ab: <0.1 s/co ratio (ref 0.0–0.9)

## 2019-03-21 ENCOUNTER — Encounter: Payer: Self-pay | Admitting: Urology

## 2019-03-21 ENCOUNTER — Ambulatory Visit: Payer: Medicare HMO

## 2019-03-22 ENCOUNTER — Ambulatory Visit: Payer: Medicare HMO

## 2019-03-26 ENCOUNTER — Ambulatory Visit: Payer: Medicare HMO | Admitting: Urology

## 2019-03-28 ENCOUNTER — Telehealth (INDEPENDENT_AMBULATORY_CARE_PROVIDER_SITE_OTHER): Payer: Medicare HMO | Admitting: Urology

## 2019-03-28 ENCOUNTER — Other Ambulatory Visit: Payer: Self-pay

## 2019-03-28 DIAGNOSIS — N430 Encysted hydrocele: Secondary | ICD-10-CM | POA: Insufficient documentation

## 2019-03-28 DIAGNOSIS — R972 Elevated prostate specific antigen [PSA]: Secondary | ICD-10-CM | POA: Diagnosis not present

## 2019-03-28 HISTORY — DX: Encysted hydrocele: N43.0

## 2019-03-28 NOTE — Progress Notes (Signed)
Virtual Visit via Telephone Note  I connected with Ronnie Bullock on 03/28/19 at  2:30 PM EDT by telephone and verified that I am speaking with the correct person using two identifiers.  Location: Patient: Home Provider: Home   I discussed the limitations, risks, security and privacy concerns of performing an evaluation and management service by telephone and the availability of in person appointments. I also discussed with the patient that there may be a patient responsible charge related to this service. The patient expressed understanding and agreed to proceed.   History of Present Illness: 73 year old male presents for a follow-up visit via telephone secondary to COVID-19 pandemic.  Urologic history: 1.  Elevated PSA  -Referred 2019 PSA 4.5; repeat 4.8/22.7% free PSA; repeat PSA 09/2018 stable 4.7  -Elected surveillance  2.  Bilateral hydroceles  -10 cm scrotal ultrasound  I spoke with both Mr. Lindahl and his son today.  At our last visit November we discussed obtaining a 4K score.  He was scheduled for this yesterday however the appointment was canceled.  He has no bothersome lower urinary tract symptoms. Denies dysuria, gross hematuria or flank/abdominal/pelvic/scrotal pain.  His son wanted to discuss his hydroceles which he states are large.   Observations/Objective: N/A  Assessment and Plan: -Elevated PSA: He will be scheduled for a 4K score and further recommendation regarding his PSA once results are reviewed.  -Bilateral hydroceles: I discussed management options of surveillance, aspiration and hydrocelectomy with the patient and his son.  They were informed the decision for treatment is based on symptom bother.  We discussed aspiration/sclerotherapy and the high recurrence rate with large hydroceles.  I also discussed hydrocelectomy.  They have elected surveillance for now however may be interested in treatment in the future.  Follow Up Instructions:    I  discussed the assessment and treatment plan with the patient. The patient was provided an opportunity to ask questions and all were answered. The patient agreed with the plan and demonstrated an understanding of the instructions.   The patient was advised to call back or seek an in-person evaluation if the symptoms worsen or if the condition fails to improve as anticipated.  I provided 12 minutes of non-face-to-face time during this encounter.   Abbie Sons, MD

## 2019-04-16 ENCOUNTER — Other Ambulatory Visit: Payer: Self-pay

## 2019-04-16 ENCOUNTER — Ambulatory Visit (INDEPENDENT_AMBULATORY_CARE_PROVIDER_SITE_OTHER): Payer: Medicare HMO | Admitting: Family Medicine

## 2019-04-16 DIAGNOSIS — R972 Elevated prostate specific antigen [PSA]: Secondary | ICD-10-CM

## 2019-04-16 NOTE — Progress Notes (Signed)
4KSCORE Due to elevated PSA level patient presents today for a 4KSCORE  Blood was drawn from left antecubital fossa   using a 23 G needle and syringe to deposit blood into a SST tube  Patient tolerated well, no complications were noted  Preformed by: Elberta Leatherwood, CMA  Additional notes/ Follow up: as scheduled

## 2019-04-17 DIAGNOSIS — R972 Elevated prostate specific antigen [PSA]: Secondary | ICD-10-CM | POA: Diagnosis not present

## 2019-04-19 ENCOUNTER — Other Ambulatory Visit: Payer: Self-pay | Admitting: Urology

## 2019-04-26 NOTE — Progress Notes (Signed)
Patient notified and voiced understanding. Biopsy scheduled.  4K score results show a 28% probability of possible aggressive prostate cancer.  Recc sched prostatebiopsy or appt to discuss further  [facility] for patient to return call.

## 2019-06-11 ENCOUNTER — Other Ambulatory Visit: Payer: Self-pay

## 2019-06-11 ENCOUNTER — Ambulatory Visit (INDEPENDENT_AMBULATORY_CARE_PROVIDER_SITE_OTHER): Payer: Medicare HMO | Admitting: Urology

## 2019-06-11 ENCOUNTER — Other Ambulatory Visit: Payer: Self-pay | Admitting: Urology

## 2019-06-11 ENCOUNTER — Encounter: Payer: Self-pay | Admitting: Urology

## 2019-06-11 VITALS — BP 150/103 | HR 77 | Ht 69.0 in | Wt 155.0 lb

## 2019-06-11 DIAGNOSIS — C61 Malignant neoplasm of prostate: Secondary | ICD-10-CM | POA: Diagnosis not present

## 2019-06-11 DIAGNOSIS — R972 Elevated prostate specific antigen [PSA]: Secondary | ICD-10-CM | POA: Diagnosis not present

## 2019-06-11 DIAGNOSIS — N4231 Prostatic intraepithelial neoplasia: Secondary | ICD-10-CM | POA: Diagnosis not present

## 2019-06-11 MED ORDER — GENTAMICIN SULFATE 40 MG/ML IJ SOLN
80.0000 mg | Freq: Once | INTRAMUSCULAR | Status: AC
  Administered 2019-06-11: 80 mg via INTRAMUSCULAR

## 2019-06-11 MED ORDER — LEVOFLOXACIN 500 MG PO TABS
500.0000 mg | ORAL_TABLET | Freq: Once | ORAL | Status: AC
  Administered 2019-06-11: 500 mg via ORAL

## 2019-06-11 NOTE — Patient Instructions (Signed)
Transrectal Ultrasound-Guided Prostate Biopsy, Care After °This sheet gives you information about how to care for yourself after your procedure. Your doctor may also give you more specific instructions. If you have problems or questions, contact your doctor. °What can I expect after the procedure? °After the procedure, it is common to have: °· Pain and discomfort in your butt, especially while sitting. °· Pink-colored pee (urine), due to small amounts of blood in the pee. °· Burning while peeing (urinating). °· Blood in your poop (stool). °· Bleeding from your butt. °· Blood in your semen. °Follow these instructions at home: °Medicines °· Take over-the-counter and prescription medicines only as told by your doctor. °· If you were prescribed antibiotic medicine, take it as told by your doctor. Do not stop taking the antibiotic even if you start to feel better. °Activity ° °· Do not drive for 24 hours if you were given a medicine to help you relax (sedative) during your procedure. °· Return to your normal activities as told by your doctor. Ask your doctor what activities are safe for you. °· Ask your doctor when it is okay for you to have sex. °· Do not lift anything that is heavier than 10 lb (4.5 kg), or the limit that you are told, until your doctor says that it is safe. °General instructions ° °· Drink enough water to keep your pee pale yellow. °· Watch your pee, poop, and semen for new bleeding or bleeding that gets worse. °· Keep all follow-up visits as told by your doctor. This is important. °Contact a doctor if you: °· Have blood clots in your pee or poop. °· Notice that your pee smells bad or unusual. °· Have very bad belly pain. °· Have trouble peeing. °· Notice that your lower belly feels firm. °· Have blood in your pee for more than 2 weeks after the procedure. °· Have blood in your semen for more than 2 months after the procedure. °· Have problems getting an erection. °· Feel sick to your stomach  (nauseous). °· Throw up (vomit). °· Have new or worse bleeding in your pee, poop, or semen. °Get help right away if you: °· Have a fever or chills. °· Have bright red pee. °· Have very bad pain that does not get better with medicine. °· Cannot pee. °Summary °· After this procedure, it is common to have pain and discomfort around your butt, especially while sitting. °· You may have blood in your pee and poop. °· It is common to have blood in your semen for 1-2 months. °· If you were prescribed antibiotic medicine, take it as told by your doctor. Do not stop taking the antibiotic even if you start to feel better. °· Get help right away if you have a fever or chills. °This information is not intended to replace advice given to you by your health care provider. Make sure you discuss any questions you have with your health care provider. °Document Released: 08/22/2017 Document Revised: 02/13/2019 Document Reviewed: 08/22/2017 °Elsevier Patient Education © 2020 [employer] ° °

## 2019-06-11 NOTE — Progress Notes (Signed)
Prostate Biopsy Procedure   Indications: PSA 5.7.  4K 4.92 PSA with 28% probability of Gleason 7 or greater prostate cancer.  Benign DRE  Informed consent was obtained after discussing risks/benefits of the procedure.  A time out was performed to ensure correct patient identity.  Pre-Procedure: - Last PSA Level: As above - Gentamicin given prophylactically - Levaquin 500 mg administered PO -Transrectal Ultrasound performed revealing a 63 gm prostate -No significant hypoechoic or median lobe noted  Procedure: - Prostate block performed using 10 cc 1% lidocaine and biopsies taken from sextant areas, a total of 12 under ultrasound guidance.  Post-Procedure: - Patient tolerated the procedure well - He was counseled to seek immediate medical attention if experiences any severe pain, significant bleeding, or fevers - Return in one week to discuss biopsy results  John Giovanni, MD

## 2019-06-21 LAB — ANATOMIC PATHOLOGY REPORT: PDF Image: 0

## 2019-06-25 ENCOUNTER — Ambulatory Visit (INDEPENDENT_AMBULATORY_CARE_PROVIDER_SITE_OTHER): Payer: Medicare HMO | Admitting: Urology

## 2019-06-25 ENCOUNTER — Other Ambulatory Visit: Payer: Self-pay

## 2019-06-25 ENCOUNTER — Encounter: Payer: Self-pay | Admitting: Urology

## 2019-06-25 VITALS — BP 123/72 | HR 54 | Ht 69.0 in | Wt 155.0 lb

## 2019-06-25 DIAGNOSIS — C61 Malignant neoplasm of prostate: Secondary | ICD-10-CM | POA: Diagnosis not present

## 2019-06-28 ENCOUNTER — Encounter: Payer: Self-pay | Admitting: Urology

## 2019-06-28 NOTE — Progress Notes (Signed)
06/25/2019 7:58 AM   Ronnie Bullock 05-Jun-1946 [id number]  Referring provider: Guadalupe Maple, MD [address] [place],  [place] [address]  Chief Complaint  Patient presents with  . Results    HPI: 73 y.o. male presents for prostate biopsy follow-up which was performed on 06/11/2019 for PSA of 5.7 and 4K probability at 28%.  He had no post biopsy problems.  -Prostate volume 63 g -No significant coag abnormalities -Standard 12 core biopsies performed -Pathology: 3/12 cores positive adenocarcinoma; RLA 3+3 (11%); RB 3+3 (57%); RM 3+4 (82%)    PMH: Past Medical History:  Diagnosis Date  . Hyperlipidemia   . Hypertension     Surgical History: Past Surgical History:  Procedure Laterality Date  . COLONOSCOPY WITH PROPOFOL N/A 05/16/2018   Procedure: COLONOSCOPY WITH PROPOFOL;  Surgeon: Virgel Manifold, MD;  Location: [facility] ENDOSCOPY;  Service: Endoscopy;  Laterality: N/A;    Home Medications:  Allergies as of 06/25/2019   No Known Allergies     Medication List       Accurate as of June 25, 2019 11:59 PM. If you have any questions, ask your nurse or doctor.        amLODipine 5 MG tablet Commonly known as: NORVASC Take 1 tablet (5 mg total) by mouth daily.       Allergies: No Known Allergies  Family History: Family History  Problem Relation Age of Onset  . Hypertension Mother   . Prostate cancer Neg Hx   . Bladder Cancer Neg Hx   . Kidney cancer Neg Hx     Social History:  reports that he has never smoked. He has never used smokeless tobacco. He reports that he does not drink alcohol or use drugs.  ROS: UROLOGY Frequent Urination?: No Hard to postpone urination?: No Burning/pain with urination?: No Get up at night to urinate?: No Leakage of urine?: No Urine stream starts and stops?: No Trouble starting stream?: No Do you have to strain to urinate?: No Blood in urine?: No Urinary tract infection?: No Sexually transmitted disease?: No  Injury to kidneys or bladder?: No Painful intercourse?: No Weak stream?: No Erection problems?: No Penile pain?: No  Gastrointestinal Nausea?: No Vomiting?: No Indigestion/heartburn?: No Diarrhea?: No Constipation?: No  Constitutional Fever: No Night sweats?: No Weight loss?: No Fatigue?: No  Skin Skin rash/lesions?: No Itching?: No  Eyes Blurred vision?: No Double vision?: No  Ears/Nose/Throat Sore throat?: No Sinus problems?: No  Hematologic/Lymphatic Swollen glands?: No Easy bruising?: No  Cardiovascular Leg swelling?: No Chest pain?: No  Respiratory Cough?: No Shortness of breath?: No  Endocrine Excessive thirst?: No  Musculoskeletal Back pain?: No Joint pain?: No  Neurological Headaches?: No Dizziness?: No  Psychologic Depression?: No Anxiety?: No  Physical Exam: BP 123/72 (BP Location: Left Arm, Patient Position: Sitting, Cuff Size: Normal)   Pulse (!) 54   Ht 5\' 9"  (1.753 m)   Wt 155 lb (70.3 kg)   BMI 22.89 kg/m   Constitutional:  Alert and oriented, No acute distress. HEENT: [place] AT, moist mucus membranes.  Trachea midline, no masses. Cardiovascular: No clubbing, cyanosis, or edema. Respiratory: Normal respiratory effort, no increased work of breathing.    Assessment & Plan:    - Clinical T1c Gleason 3+4 adenocarcinoma prostate; intermediate risk, unfavorable The pathology report was discussed in detail with Ronnie Bullock and a colleague from work.  Based on his pathology I would not recommend surveillance.  Curative treatment options were discussed including radical prostatectomy  and radiation modalities.  He was informed both treatments were considered equally effective in the treatment of prostate cancer and the decision comes down to his preference.  Will schedule a CT of the abdomen and pelvis and an appointment with radiation oncology.  If he is interested in radical prostatectomy will set up an appointment with Dr. Erlene Quan or  Dr. Diamantina Providence.  Greater than 50% of this 15-minute visit was spent counseling the patient.   Abbie Sons, [facility] [address] [place], [place] [address] [phone]

## 2019-07-03 ENCOUNTER — Encounter: Payer: Self-pay | Admitting: Family Medicine

## 2019-07-03 ENCOUNTER — Ambulatory Visit (INDEPENDENT_AMBULATORY_CARE_PROVIDER_SITE_OTHER): Payer: Medicare HMO | Admitting: Family Medicine

## 2019-07-03 ENCOUNTER — Other Ambulatory Visit: Payer: Self-pay

## 2019-07-03 DIAGNOSIS — I10 Essential (primary) hypertension: Secondary | ICD-10-CM | POA: Diagnosis not present

## 2019-07-03 DIAGNOSIS — C61 Malignant neoplasm of prostate: Secondary | ICD-10-CM | POA: Diagnosis not present

## 2019-07-03 DIAGNOSIS — E7800 Pure hypercholesterolemia, unspecified: Secondary | ICD-10-CM

## 2019-07-03 MED ORDER — AMLODIPINE BESYLATE 5 MG PO TABS: 5.0000 mg | ORAL_TABLET | Freq: Every day | ORAL | 2 refills | Status: DC

## 2019-07-03 NOTE — Assessment & Plan Note (Signed)
Going tomorrow for radiation assessment

## 2019-07-03 NOTE — Assessment & Plan Note (Signed)
The current medical regimen is effective;  continue present plan and medications.  

## 2019-07-03 NOTE — Progress Notes (Signed)
   There were no vitals taken for this visit.   Subjective:    Patient ID: Ronnie Bullock, male    DOB: [birth year], 73 y.o.   MRN: [id number]  HPI: Ronnie Bullock is a 73 y.o. male  Med check BP f/u doing well Prostate cancer  Discussed with patient and son who assisted with history blood pressures been doing well with no complaints from blood pressure. Is talked with Dr. Bernardo Heater about prostate cancer and various treatments reviewed radiation therapy and age. Cholesterols been doing well with no complaints reviewed previous cholesterol numbers patient doing well.  Relevant past medical, surgical, family and social history reviewed and updated as indicated. Interim medical history since our last visit reviewed. Allergies and medications reviewed and updated.  Review of Systems  Constitutional: Negative.   Respiratory: Negative.   Cardiovascular: Negative.     Per HPI unless specifically indicated above     Objective:    There were no vitals taken for this visit.  Wt Readings from Last 3 Encounters:  06/25/19 155 lb (70.3 kg)  06/11/19 155 lb (70.3 kg)  01/01/19 155 lb (70.3 kg)    Physical Exam  Results for orders placed or performed in visit on 06/11/19  Anatomic Pathology Report  Result Value Ref Range   Diagnosis synopsis: See below: (A)    Specimen: Comment    Most significant diagnosis: Comment    Diagnosis: See below: (A)    Comment: Comment    Microscopic description: Comment    Grossing comments: Comment    Gross description: Comment    Case comment(s): Comment    Electronically signed by: Comment    CPT code(s): Comment    CPT Disclaimer: Comment    Clinician provided ICD: R97.20    Pathologist provided ICD: Comment    PDF Image .       Assessment & Plan:   Problem List Items Addressed This Visit      Cardiovascular and Mediastinum   Essential hypertension, benign    The current medical regimen is effective;  continue present plan and  medications.       Relevant Medications   amLODipine (NORVASC) 5 MG tablet     Genitourinary   Prostate cancer ([place])    Going tomorrow for radiation assessment        Other   Hypercholesteremia    The current medical regimen is effective;  continue present plan and medications.       Relevant Medications   amLODipine (NORVASC) 5 MG tablet       Follow up plan: Return in about 6 months (around 01/03/2020) for Physical Exam.

## 2019-07-04 ENCOUNTER — Telehealth: Payer: Self-pay | Admitting: Urology

## 2019-07-04 ENCOUNTER — Encounter: Payer: Self-pay | Admitting: Radiation Oncology

## 2019-07-04 ENCOUNTER — Other Ambulatory Visit: Payer: Self-pay

## 2019-07-04 ENCOUNTER — Ambulatory Visit
Admission: RE | Admit: 2019-07-04 | Discharge: 2019-07-04 | Disposition: A | Discharge status: Home / Self Care | Payer: Medicare HMO | Source: Ambulatory Visit | Attending: Radiation Oncology | Admitting: Radiation Oncology

## 2019-07-04 DIAGNOSIS — Z923 Personal history of irradiation: Secondary | ICD-10-CM | POA: Diagnosis not present

## 2019-07-04 DIAGNOSIS — E785 Hyperlipidemia, unspecified: Secondary | ICD-10-CM | POA: Insufficient documentation

## 2019-07-04 DIAGNOSIS — C61 Malignant neoplasm of prostate: Secondary | ICD-10-CM | POA: Diagnosis not present

## 2019-07-04 DIAGNOSIS — I10 Essential (primary) hypertension: Secondary | ICD-10-CM | POA: Insufficient documentation

## 2019-07-04 NOTE — Consult Note (Signed)
NEW PATIENT EVALUATION  Name: Ronnie Bullock  MRN: [id number]  Date:   07/04/2019     DOB: 1946-05-27   This 73 y.o. male patient presents to the clinic for initial evaluation of stage IIb Gleason 7 (3+4) presenting with a PSA of 5.7.  His 4K score r revealed the possibility 20% of possible aggressive prostate cancer.  His clinical stage is T1c N0 M0  REFERRING PHYSICIAN: Stoioff, Ronda Fairly, MD  CHIEF COMPLAINT:  Chief Complaint  Patient presents with  . Prostate Cancer    Initial consultation    DIAGNOSIS: The encounter diagnosis was Prostate cancer ([place]).   PREVIOUS INVESTIGATIONS:  Pathology report reviewed CT scan of the abdomen scheduled Clinical notes reviewed  HPI: Patient is a 73 year old male who originally presented with an elevated PSA of 5.7.  This prompted transrectal ultrasound-guided biopsy showing 3 of 12 cores positive for adenocarcinoma mostly Gleason 6 (3+3) although there was Gleason 7 (3+4).  He had a 4K probability at 28% of aggressive prostate cancer.  Based on pathology he was not recommended for surveillance he was offered choice between prostatectomy and radiation therapy and is seen today for consideration of radiation options.  His prostate gland is over 60 cc making I-125 interstitial implant not recommended.  He specifically denies any increased urinary urinary symptoms any diarrhea fatigue or prior abdominal surgery.  PLANNED TREATMENT REGIMEN: IMRT image guided radiation therapy  PAST MEDICAL HISTORY:  has a past medical history of Hyperlipidemia and Hypertension.    PAST SURGICAL HISTORY:  Past Surgical History:  Procedure Laterality Date  . COLONOSCOPY WITH PROPOFOL N/A 05/16/2018   Procedure: COLONOSCOPY WITH PROPOFOL;  Surgeon: Virgel Manifold, MD;  Location: [facility] ENDOSCOPY;  Service: Endoscopy;  Laterality: N/A;    FAMILY HISTORY: family history includes Hypertension in his mother.  SOCIAL HISTORY:  reports that he has never smoked. He  has never used smokeless tobacco. He reports that he does not drink alcohol or use drugs.  ALLERGIES: Patient has no known allergies.  MEDICATIONS:  Current Outpatient Medications  Medication Sig Dispense Refill  . amLODipine (NORVASC) 5 MG tablet Take 1 tablet (5 mg total) by mouth daily. 90 tablet 2   No current facility-administered medications for this encounter.     ECOG PERFORMANCE STATUS:  0 - Asymptomatic  REVIEW OF SYSTEMS: Patient denies any weight loss, fatigue, weakness, fever, chills or night sweats. Patient denies any loss of vision, blurred vision. Patient denies any ringing  of the ears or hearing loss. No irregular heartbeat. Patient denies heart murmur or history of fainting. Patient denies any chest pain or pain radiating to her upper extremities. Patient denies any shortness of breath, difficulty breathing at night, cough or hemoptysis. Patient denies any swelling in the lower legs. Patient denies any nausea vomiting, vomiting of blood, or coffee ground material in the vomitus. Patient denies any stomach pain. Patient states has had normal bowel movements no significant constipation or diarrhea. Patient denies any dysuria, hematuria or significant nocturia. Patient denies any problems walking, swelling in the joints or loss of balance. Patient denies any skin changes, loss of hair or loss of weight. Patient denies any excessive worrying or anxiety or significant depression. Patient denies any problems with insomnia. Patient denies excessive thirst, polyuria, polydipsia. Patient denies any swollen glands, patient denies easy bruising or easy bleeding. Patient denies any recent infections, allergies or URI. Patient "s visual fields have not changed significantly in recent time.   PHYSICAL EXAM: BP (!) (  P) 152/104 (BP Location: Left Arm, Patient Position: Sitting)   Pulse (P) 80   Temp (!) (P) 97 F (36.1 C) (Tympanic)   Resp (P) 16   Wt (P) 148 lb 1.6 oz (67.2 kg)   BMI (P)  21.87 kg/m  Well-developed well-nourished patient in NAD. HEENT reveals PERLA, EOMI, discs not visualized.  Oral cavity is clear. No oral mucosal lesions are identified. Neck is clear without evidence of cervical or supraclavicular adenopathy. Lungs are clear to A&P. Cardiac examination is essentially unremarkable with regular rate and rhythm without murmur rub or thrill. Abdomen is benign with no organomegaly or masses noted. Motor sensory and DTR levels are equal and symmetric in the upper and lower extremities. Cranial nerves II through XII are grossly intact. Proprioception is intact. No peripheral adenopathy or edema is identified. No motor or sensory levels are noted. Crude visual fields are within normal range.  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  In 73 year old male  PLAN: This time I have had a discussion with the patient regarding IMRT image guided radiation therapy.  Based on his age overall performance status patient is elected declined surgery and go with radiation therapy.  I have asked Dr. Bernardo Heater to place fiduciary markers in his prostate for image guided daily treatment.  I would plan on delivering 8000 cGy over 8 weeks to his prostate.  Risks and benefits of treatment including possible diarrhea possible fatigue possible increased lower urinary tract symptoms such as urgency and frequency and skin reaction all were discussed with the patient.  I have personally set up and ordered CT simulation after his markers are placed.  Patient comprehends her treatment plan well.  I have also talked with his caregiver today everyone comprehends her treatment plan well.  I would like to take this opportunity to thank you for allowing me to participate in the care of your patient.Noreene Filbert,  MD

## 2019-07-04 NOTE — Telephone Encounter (Signed)
-----   Message from Christean Grief, RN sent at 07/04/2019  9:12 AM EDT ----- Regarding: seed markers Patient will need to be scheduled for gold markers placed.  Please notify the care giver Jon [phone].  Thanks, Ranelle Oyster

## 2019-07-04 NOTE — Telephone Encounter (Signed)
App has been made  Caregiver aware Ronnie Bullock over instructions with him on the phone   [place]

## 2019-07-25 ENCOUNTER — Encounter: Payer: Self-pay | Admitting: Urology

## 2019-07-25 ENCOUNTER — Other Ambulatory Visit: Payer: Self-pay

## 2019-07-25 ENCOUNTER — Ambulatory Visit (INDEPENDENT_AMBULATORY_CARE_PROVIDER_SITE_OTHER): Payer: Medicare HMO | Admitting: Urology

## 2019-07-25 ENCOUNTER — Telehealth: Payer: Self-pay | Admitting: Urology

## 2019-07-25 VITALS — BP 158/89 | HR 108 | Ht 69.0 in | Wt 147.0 lb

## 2019-07-25 DIAGNOSIS — C61 Malignant neoplasm of prostate: Secondary | ICD-10-CM

## 2019-07-25 MED ORDER — GENTAMICIN SULFATE 40 MG/ML IJ SOLN
80.0000 mg | Freq: Once | INTRAMUSCULAR | Status: AC
  Administered 2019-07-25: 80 mg via INTRAMUSCULAR

## 2019-07-25 MED ORDER — LEVOFLOXACIN 500 MG PO TABS
500.0000 mg | ORAL_TABLET | Freq: Once | ORAL | Status: AC
  Administered 2019-07-25: 500 mg via ORAL

## 2019-07-25 NOTE — Telephone Encounter (Signed)
Patient was seen in the office today for gold seed marker placement.    Patient's caregiver, Leanor Kail, is requesting a call back with care instructions.

## 2019-07-25 NOTE — Telephone Encounter (Signed)
Called pt's caregiver, Wille Glaser. No answer. No voicemail. 1st attempt.

## 2019-07-25 NOTE — Progress Notes (Signed)
07/25/19  CC: gold fiducial markers  HPI: 73 y.o. male with prostate cancer who presents today for placement of fiducial markers in anticipation of his upcoming IMRT with Dr. Baruch Gouty.  Prostate Gold Fiducial Marker Placement Procedure   Informed consent was obtained after discussing risks/benefits of the procedure.  A time out was performed to ensure correct patient identity.  Pre-Procedure: - Gentamicin given prophylactically - PO Levaquin 500 mg also given today  Procedure: -Lidocaine jelly was administered per rectum -Rectal ultrasound probe was placed without difficulty and the prostate visualized - 3 fiducial gold markers placed, one at right base, one at left base, one at apex of prostate gland under transrectal ultrasound guidance  Post-Procedure: - Patient tolerated the procedure well - He was counseled to seek immediate medical attention if experiences any severe pain, significant bleeding, or fevers   John Giovanni, MD

## 2019-07-26 NOTE — Telephone Encounter (Signed)
Called pt's caregiver informed him that there are no restrictions and pt is able to return to work today. Wille Glaser gave verbal understanding.

## 2019-07-29 ENCOUNTER — Ambulatory Visit
Admission: RE | Admit: 2019-07-29 | Discharge: 2019-07-29 | Disposition: A | Discharge status: Home / Self Care | Payer: Medicare HMO | Source: Ambulatory Visit | Attending: Radiation Oncology | Admitting: Radiation Oncology

## 2019-07-29 ENCOUNTER — Other Ambulatory Visit: Payer: Self-pay

## 2019-07-29 DIAGNOSIS — Z510 Encounter for antineoplastic radiation therapy: Secondary | ICD-10-CM | POA: Insufficient documentation

## 2019-07-29 DIAGNOSIS — C61 Malignant neoplasm of prostate: Secondary | ICD-10-CM | POA: Insufficient documentation

## 2019-07-30 DIAGNOSIS — C61 Malignant neoplasm of prostate: Secondary | ICD-10-CM | POA: Diagnosis not present

## 2019-07-30 DIAGNOSIS — Z510 Encounter for antineoplastic radiation therapy: Secondary | ICD-10-CM | POA: Diagnosis not present

## 2019-08-02 ENCOUNTER — Other Ambulatory Visit: Payer: Self-pay | Admitting: *Deleted

## 2019-08-02 DIAGNOSIS — C61 Malignant neoplasm of prostate: Secondary | ICD-10-CM

## 2019-08-07 ENCOUNTER — Other Ambulatory Visit: Payer: Self-pay

## 2019-08-07 ENCOUNTER — Ambulatory Visit
Admission: RE | Admit: 2019-08-07 | Discharge: 2019-08-07 | Disposition: A | Discharge status: Home / Self Care | Payer: Medicare HMO | Source: Ambulatory Visit | Attending: Radiation Oncology | Admitting: Radiation Oncology

## 2019-08-07 DIAGNOSIS — C61 Malignant neoplasm of prostate: Secondary | ICD-10-CM | POA: Diagnosis not present

## 2019-08-08 ENCOUNTER — Ambulatory Visit
Admission: RE | Admit: 2019-08-08 | Discharge: 2019-08-08 | Disposition: A | Discharge status: Home / Self Care | Payer: Medicare HMO | Source: Ambulatory Visit | Attending: Radiation Oncology | Admitting: Radiation Oncology

## 2019-08-08 ENCOUNTER — Other Ambulatory Visit: Payer: Self-pay

## 2019-08-08 DIAGNOSIS — C61 Malignant neoplasm of prostate: Secondary | ICD-10-CM | POA: Insufficient documentation

## 2019-08-08 DIAGNOSIS — Z510 Encounter for antineoplastic radiation therapy: Secondary | ICD-10-CM | POA: Insufficient documentation

## 2019-08-09 ENCOUNTER — Ambulatory Visit
Admission: RE | Admit: 2019-08-09 | Discharge: 2019-08-09 | Disposition: A | Discharge status: Home / Self Care | Payer: Medicare HMO | Source: Ambulatory Visit | Attending: Radiation Oncology | Admitting: Radiation Oncology

## 2019-08-09 ENCOUNTER — Other Ambulatory Visit: Payer: Self-pay

## 2019-08-09 DIAGNOSIS — C61 Malignant neoplasm of prostate: Secondary | ICD-10-CM | POA: Diagnosis not present

## 2019-08-09 DIAGNOSIS — Z510 Encounter for antineoplastic radiation therapy: Secondary | ICD-10-CM | POA: Diagnosis not present

## 2019-08-12 ENCOUNTER — Other Ambulatory Visit: Payer: Self-pay

## 2019-08-12 ENCOUNTER — Ambulatory Visit
Admission: RE | Admit: 2019-08-12 | Discharge: 2019-08-12 | Disposition: A | Discharge status: Home / Self Care | Payer: Medicare HMO | Source: Ambulatory Visit | Attending: Radiation Oncology | Admitting: Radiation Oncology

## 2019-08-12 DIAGNOSIS — C61 Malignant neoplasm of prostate: Secondary | ICD-10-CM | POA: Diagnosis not present

## 2019-08-12 DIAGNOSIS — Z510 Encounter for antineoplastic radiation therapy: Secondary | ICD-10-CM | POA: Diagnosis not present

## 2019-08-13 ENCOUNTER — Other Ambulatory Visit: Payer: Self-pay

## 2019-08-13 ENCOUNTER — Ambulatory Visit
Admission: RE | Admit: 2019-08-13 | Discharge: 2019-08-13 | Disposition: A | Discharge status: Home / Self Care | Payer: Medicare HMO | Source: Ambulatory Visit | Attending: Radiation Oncology | Admitting: Radiation Oncology

## 2019-08-13 DIAGNOSIS — C61 Malignant neoplasm of prostate: Secondary | ICD-10-CM | POA: Diagnosis not present

## 2019-08-13 DIAGNOSIS — Z510 Encounter for antineoplastic radiation therapy: Secondary | ICD-10-CM | POA: Diagnosis not present

## 2019-08-14 ENCOUNTER — Ambulatory Visit
Admission: RE | Admit: 2019-08-14 | Discharge: 2019-08-14 | Disposition: A | Discharge status: Home / Self Care | Payer: Medicare HMO | Source: Ambulatory Visit | Attending: Radiation Oncology | Admitting: Radiation Oncology

## 2019-08-14 ENCOUNTER — Other Ambulatory Visit: Payer: Self-pay

## 2019-08-14 DIAGNOSIS — C61 Malignant neoplasm of prostate: Secondary | ICD-10-CM | POA: Diagnosis not present

## 2019-08-14 DIAGNOSIS — Z510 Encounter for antineoplastic radiation therapy: Secondary | ICD-10-CM | POA: Diagnosis not present

## 2019-08-15 ENCOUNTER — Other Ambulatory Visit: Payer: Self-pay

## 2019-08-15 ENCOUNTER — Other Ambulatory Visit: Payer: Self-pay | Admitting: *Deleted

## 2019-08-15 ENCOUNTER — Ambulatory Visit
Admission: RE | Admit: 2019-08-15 | Discharge: 2019-08-15 | Disposition: A | Discharge status: Home / Self Care | Payer: Medicare HMO | Source: Ambulatory Visit | Attending: Radiation Oncology | Admitting: Radiation Oncology

## 2019-08-15 DIAGNOSIS — Z510 Encounter for antineoplastic radiation therapy: Secondary | ICD-10-CM | POA: Diagnosis not present

## 2019-08-15 DIAGNOSIS — C61 Malignant neoplasm of prostate: Secondary | ICD-10-CM | POA: Diagnosis not present

## 2019-08-16 ENCOUNTER — Other Ambulatory Visit: Payer: Self-pay

## 2019-08-16 ENCOUNTER — Ambulatory Visit
Admission: RE | Admit: 2019-08-16 | Discharge: 2019-08-16 | Disposition: A | Discharge status: Home / Self Care | Payer: Medicare HMO | Source: Ambulatory Visit | Attending: Radiation Oncology | Admitting: Radiation Oncology

## 2019-08-16 DIAGNOSIS — C61 Malignant neoplasm of prostate: Secondary | ICD-10-CM | POA: Diagnosis not present

## 2019-08-16 DIAGNOSIS — Z510 Encounter for antineoplastic radiation therapy: Secondary | ICD-10-CM | POA: Diagnosis not present

## 2019-08-19 ENCOUNTER — Ambulatory Visit
Admission: RE | Admit: 2019-08-19 | Discharge: 2019-08-19 | Disposition: A | Discharge status: Home / Self Care | Payer: Medicare HMO | Source: Ambulatory Visit | Attending: Radiation Oncology | Admitting: Radiation Oncology

## 2019-08-19 ENCOUNTER — Other Ambulatory Visit: Payer: Self-pay

## 2019-08-19 DIAGNOSIS — Z510 Encounter for antineoplastic radiation therapy: Secondary | ICD-10-CM | POA: Diagnosis not present

## 2019-08-19 DIAGNOSIS — C61 Malignant neoplasm of prostate: Secondary | ICD-10-CM | POA: Diagnosis not present

## 2019-08-20 ENCOUNTER — Other Ambulatory Visit: Payer: Self-pay

## 2019-08-20 ENCOUNTER — Ambulatory Visit
Admission: RE | Admit: 2019-08-20 | Discharge: 2019-08-20 | Disposition: A | Discharge status: Home / Self Care | Payer: Medicare HMO | Source: Ambulatory Visit | Attending: Radiation Oncology | Admitting: Radiation Oncology

## 2019-08-20 DIAGNOSIS — Z510 Encounter for antineoplastic radiation therapy: Secondary | ICD-10-CM | POA: Diagnosis not present

## 2019-08-20 DIAGNOSIS — C61 Malignant neoplasm of prostate: Secondary | ICD-10-CM | POA: Diagnosis not present

## 2019-08-21 ENCOUNTER — Other Ambulatory Visit: Payer: Self-pay

## 2019-08-21 ENCOUNTER — Ambulatory Visit
Admission: RE | Admit: 2019-08-21 | Discharge: 2019-08-21 | Disposition: A | Discharge status: Home / Self Care | Payer: Medicare HMO | Source: Ambulatory Visit | Attending: Radiation Oncology | Admitting: Radiation Oncology

## 2019-08-21 DIAGNOSIS — C61 Malignant neoplasm of prostate: Secondary | ICD-10-CM | POA: Diagnosis not present

## 2019-08-21 DIAGNOSIS — Z510 Encounter for antineoplastic radiation therapy: Secondary | ICD-10-CM | POA: Diagnosis not present

## 2019-08-22 ENCOUNTER — Other Ambulatory Visit: Payer: Self-pay

## 2019-08-22 ENCOUNTER — Inpatient Hospital Stay: Payer: Medicare HMO | Attending: Radiation Oncology

## 2019-08-22 ENCOUNTER — Ambulatory Visit
Admission: RE | Admit: 2019-08-22 | Discharge: 2019-08-22 | Disposition: A | Discharge status: Home / Self Care | Payer: Medicare HMO | Source: Ambulatory Visit | Attending: Radiation Oncology | Admitting: Radiation Oncology

## 2019-08-22 DIAGNOSIS — Z510 Encounter for antineoplastic radiation therapy: Secondary | ICD-10-CM | POA: Diagnosis not present

## 2019-08-22 DIAGNOSIS — C61 Malignant neoplasm of prostate: Secondary | ICD-10-CM | POA: Diagnosis not present

## 2019-08-22 LAB — CBC
HCT: 38.3 % — ABNORMAL LOW (ref 39.0–52.0)
Hemoglobin: 12.4 g/dL — ABNORMAL LOW (ref 13.0–17.0)
MCH: 29.8 pg (ref 26.0–34.0)
MCHC: 32.4 g/dL (ref 30.0–36.0)
MCV: 92.1 fL (ref 80.0–100.0)
Platelets: 187 10*3/uL (ref 150–400)
RBC: 4.16 MIL/uL — ABNORMAL LOW (ref 4.22–5.81)
RDW: 14.4 % (ref 11.5–15.5)
WBC: 4.5 10*3/uL (ref 4.0–10.5)
nRBC: 0 % (ref 0.0–0.2)

## 2019-08-23 ENCOUNTER — Ambulatory Visit
Admission: RE | Admit: 2019-08-23 | Discharge: 2019-08-23 | Disposition: A | Discharge status: Home / Self Care | Payer: Medicare HMO | Source: Ambulatory Visit | Attending: Radiation Oncology | Admitting: Radiation Oncology

## 2019-08-23 ENCOUNTER — Other Ambulatory Visit: Payer: Self-pay

## 2019-08-23 DIAGNOSIS — Z510 Encounter for antineoplastic radiation therapy: Secondary | ICD-10-CM | POA: Diagnosis not present

## 2019-08-23 DIAGNOSIS — C61 Malignant neoplasm of prostate: Secondary | ICD-10-CM | POA: Diagnosis not present

## 2019-08-26 ENCOUNTER — Other Ambulatory Visit: Payer: Self-pay

## 2019-08-26 ENCOUNTER — Ambulatory Visit
Admission: RE | Admit: 2019-08-26 | Discharge: 2019-08-26 | Disposition: A | Discharge status: Home / Self Care | Payer: Medicare HMO | Source: Ambulatory Visit | Attending: Radiation Oncology | Admitting: Radiation Oncology

## 2019-08-26 DIAGNOSIS — C61 Malignant neoplasm of prostate: Secondary | ICD-10-CM | POA: Diagnosis not present

## 2019-08-26 DIAGNOSIS — Z510 Encounter for antineoplastic radiation therapy: Secondary | ICD-10-CM | POA: Diagnosis not present

## 2019-08-27 ENCOUNTER — Other Ambulatory Visit: Payer: Self-pay

## 2019-08-27 ENCOUNTER — Ambulatory Visit
Admission: RE | Admit: 2019-08-27 | Discharge: 2019-08-27 | Disposition: A | Discharge status: Home / Self Care | Payer: Medicare HMO | Source: Ambulatory Visit | Attending: Radiation Oncology | Admitting: Radiation Oncology

## 2019-08-27 DIAGNOSIS — C61 Malignant neoplasm of prostate: Secondary | ICD-10-CM | POA: Diagnosis not present

## 2019-08-27 DIAGNOSIS — Z510 Encounter for antineoplastic radiation therapy: Secondary | ICD-10-CM | POA: Diagnosis not present

## 2019-08-28 ENCOUNTER — Ambulatory Visit
Admission: RE | Admit: 2019-08-28 | Discharge: 2019-08-28 | Disposition: A | Discharge status: Home / Self Care | Payer: Medicare HMO | Source: Ambulatory Visit | Attending: Radiation Oncology | Admitting: Radiation Oncology

## 2019-08-28 ENCOUNTER — Other Ambulatory Visit: Payer: Self-pay

## 2019-08-28 DIAGNOSIS — C61 Malignant neoplasm of prostate: Secondary | ICD-10-CM | POA: Diagnosis not present

## 2019-08-28 DIAGNOSIS — Z510 Encounter for antineoplastic radiation therapy: Secondary | ICD-10-CM | POA: Diagnosis not present

## 2019-08-29 ENCOUNTER — Ambulatory Visit
Admission: RE | Admit: 2019-08-29 | Discharge: 2019-08-29 | Disposition: A | Discharge status: Home / Self Care | Payer: Medicare HMO | Source: Ambulatory Visit | Attending: Radiation Oncology | Admitting: Radiation Oncology

## 2019-08-29 ENCOUNTER — Other Ambulatory Visit: Payer: Self-pay

## 2019-08-29 DIAGNOSIS — Z510 Encounter for antineoplastic radiation therapy: Secondary | ICD-10-CM | POA: Diagnosis not present

## 2019-08-29 DIAGNOSIS — C61 Malignant neoplasm of prostate: Secondary | ICD-10-CM | POA: Diagnosis not present

## 2019-08-30 ENCOUNTER — Other Ambulatory Visit: Payer: Self-pay

## 2019-08-30 ENCOUNTER — Ambulatory Visit
Admission: RE | Admit: 2019-08-30 | Discharge: 2019-08-30 | Disposition: A | Discharge status: Home / Self Care | Payer: Medicare HMO | Source: Ambulatory Visit | Attending: Radiation Oncology | Admitting: Radiation Oncology

## 2019-08-30 DIAGNOSIS — Z510 Encounter for antineoplastic radiation therapy: Secondary | ICD-10-CM | POA: Diagnosis not present

## 2019-08-30 DIAGNOSIS — C61 Malignant neoplasm of prostate: Secondary | ICD-10-CM | POA: Diagnosis not present

## 2019-09-02 ENCOUNTER — Ambulatory Visit
Admission: RE | Admit: 2019-09-02 | Discharge: 2019-09-02 | Disposition: A | Discharge status: Home / Self Care | Payer: Medicare HMO | Source: Ambulatory Visit | Attending: Radiation Oncology | Admitting: Radiation Oncology

## 2019-09-02 ENCOUNTER — Other Ambulatory Visit: Payer: Self-pay

## 2019-09-02 DIAGNOSIS — C61 Malignant neoplasm of prostate: Secondary | ICD-10-CM | POA: Diagnosis not present

## 2019-09-02 DIAGNOSIS — Z510 Encounter for antineoplastic radiation therapy: Secondary | ICD-10-CM | POA: Diagnosis not present

## 2019-09-03 ENCOUNTER — Other Ambulatory Visit: Payer: Self-pay

## 2019-09-03 ENCOUNTER — Ambulatory Visit
Admission: RE | Admit: 2019-09-03 | Discharge: 2019-09-03 | Disposition: A | Discharge status: Home / Self Care | Payer: Medicare HMO | Source: Ambulatory Visit | Attending: Radiation Oncology | Admitting: Radiation Oncology

## 2019-09-03 DIAGNOSIS — C61 Malignant neoplasm of prostate: Secondary | ICD-10-CM | POA: Diagnosis not present

## 2019-09-03 DIAGNOSIS — Z510 Encounter for antineoplastic radiation therapy: Secondary | ICD-10-CM | POA: Diagnosis not present

## 2019-09-04 ENCOUNTER — Other Ambulatory Visit: Payer: Self-pay

## 2019-09-04 ENCOUNTER — Ambulatory Visit
Admission: RE | Admit: 2019-09-04 | Discharge: 2019-09-04 | Disposition: A | Discharge status: Home / Self Care | Payer: Medicare HMO | Source: Ambulatory Visit | Attending: Radiation Oncology | Admitting: Radiation Oncology

## 2019-09-04 DIAGNOSIS — C61 Malignant neoplasm of prostate: Secondary | ICD-10-CM | POA: Diagnosis not present

## 2019-09-04 DIAGNOSIS — Z510 Encounter for antineoplastic radiation therapy: Secondary | ICD-10-CM | POA: Diagnosis not present

## 2019-09-05 ENCOUNTER — Ambulatory Visit
Admission: RE | Admit: 2019-09-05 | Discharge: 2019-09-05 | Disposition: A | Discharge status: Home / Self Care | Payer: Medicare HMO | Source: Ambulatory Visit | Attending: Radiation Oncology | Admitting: Radiation Oncology

## 2019-09-05 ENCOUNTER — Inpatient Hospital Stay: Payer: Medicare HMO

## 2019-09-05 ENCOUNTER — Other Ambulatory Visit: Payer: Self-pay

## 2019-09-05 DIAGNOSIS — C61 Malignant neoplasm of prostate: Secondary | ICD-10-CM | POA: Diagnosis not present

## 2019-09-05 DIAGNOSIS — Z510 Encounter for antineoplastic radiation therapy: Secondary | ICD-10-CM | POA: Diagnosis not present

## 2019-09-05 LAB — CBC
HCT: 36.9 % — ABNORMAL LOW (ref 39.0–52.0)
Hemoglobin: 11.9 g/dL — ABNORMAL LOW (ref 13.0–17.0)
MCH: 29.7 pg (ref 26.0–34.0)
MCHC: 32.2 g/dL (ref 30.0–36.0)
MCV: 92 fL (ref 80.0–100.0)
Platelets: 185 10*3/uL (ref 150–400)
RBC: 4.01 MIL/uL — ABNORMAL LOW (ref 4.22–5.81)
RDW: 14.6 % (ref 11.5–15.5)
WBC: 4.1 10*3/uL (ref 4.0–10.5)
nRBC: 0 % (ref 0.0–0.2)

## 2019-09-06 ENCOUNTER — Other Ambulatory Visit: Payer: Self-pay

## 2019-09-06 ENCOUNTER — Ambulatory Visit
Admission: RE | Admit: 2019-09-06 | Discharge: 2019-09-06 | Disposition: A | Discharge status: Home / Self Care | Payer: Medicare HMO | Source: Ambulatory Visit | Attending: Radiation Oncology | Admitting: Radiation Oncology

## 2019-09-06 DIAGNOSIS — Z510 Encounter for antineoplastic radiation therapy: Secondary | ICD-10-CM | POA: Diagnosis not present

## 2019-09-06 DIAGNOSIS — C61 Malignant neoplasm of prostate: Secondary | ICD-10-CM | POA: Diagnosis not present

## 2019-09-09 ENCOUNTER — Other Ambulatory Visit: Payer: Self-pay

## 2019-09-09 ENCOUNTER — Ambulatory Visit
Admission: RE | Admit: 2019-09-09 | Discharge: 2019-09-09 | Disposition: A | Discharge status: Home / Self Care | Payer: Medicare HMO | Source: Ambulatory Visit | Attending: Radiation Oncology | Admitting: Radiation Oncology

## 2019-09-09 DIAGNOSIS — C61 Malignant neoplasm of prostate: Secondary | ICD-10-CM | POA: Insufficient documentation

## 2019-09-09 DIAGNOSIS — Z510 Encounter for antineoplastic radiation therapy: Secondary | ICD-10-CM | POA: Diagnosis not present

## 2019-09-10 ENCOUNTER — Other Ambulatory Visit: Payer: Self-pay

## 2019-09-10 ENCOUNTER — Ambulatory Visit
Admission: RE | Admit: 2019-09-10 | Discharge: 2019-09-10 | Disposition: A | Discharge status: Home / Self Care | Payer: Medicare HMO | Source: Ambulatory Visit | Attending: Radiation Oncology | Admitting: Radiation Oncology

## 2019-09-10 DIAGNOSIS — C61 Malignant neoplasm of prostate: Secondary | ICD-10-CM | POA: Diagnosis not present

## 2019-09-10 DIAGNOSIS — Z510 Encounter for antineoplastic radiation therapy: Secondary | ICD-10-CM | POA: Diagnosis not present

## 2019-09-11 ENCOUNTER — Ambulatory Visit
Admission: RE | Admit: 2019-09-11 | Discharge: 2019-09-11 | Disposition: A | Discharge status: Home / Self Care | Payer: Medicare HMO | Source: Ambulatory Visit | Attending: Radiation Oncology | Admitting: Radiation Oncology

## 2019-09-11 ENCOUNTER — Other Ambulatory Visit: Payer: Self-pay

## 2019-09-11 DIAGNOSIS — C61 Malignant neoplasm of prostate: Secondary | ICD-10-CM | POA: Diagnosis not present

## 2019-09-11 DIAGNOSIS — Z510 Encounter for antineoplastic radiation therapy: Secondary | ICD-10-CM | POA: Diagnosis not present

## 2019-09-12 ENCOUNTER — Ambulatory Visit
Admission: RE | Admit: 2019-09-12 | Discharge: 2019-09-12 | Disposition: A | Discharge status: Home / Self Care | Payer: Medicare HMO | Source: Ambulatory Visit | Attending: Radiation Oncology | Admitting: Radiation Oncology

## 2019-09-12 ENCOUNTER — Other Ambulatory Visit: Payer: Self-pay

## 2019-09-12 DIAGNOSIS — Z510 Encounter for antineoplastic radiation therapy: Secondary | ICD-10-CM | POA: Diagnosis not present

## 2019-09-12 DIAGNOSIS — C61 Malignant neoplasm of prostate: Secondary | ICD-10-CM | POA: Diagnosis not present

## 2019-09-13 ENCOUNTER — Other Ambulatory Visit: Payer: Self-pay

## 2019-09-13 ENCOUNTER — Ambulatory Visit
Admission: RE | Admit: 2019-09-13 | Discharge: 2019-09-13 | Disposition: A | Discharge status: Home / Self Care | Payer: Medicare HMO | Source: Ambulatory Visit | Attending: Radiation Oncology | Admitting: Radiation Oncology

## 2019-09-13 DIAGNOSIS — Z510 Encounter for antineoplastic radiation therapy: Secondary | ICD-10-CM | POA: Diagnosis not present

## 2019-09-13 DIAGNOSIS — C61 Malignant neoplasm of prostate: Secondary | ICD-10-CM | POA: Diagnosis not present

## 2019-09-16 ENCOUNTER — Other Ambulatory Visit: Payer: Self-pay

## 2019-09-16 ENCOUNTER — Ambulatory Visit
Admission: RE | Admit: 2019-09-16 | Discharge: 2019-09-16 | Disposition: A | Discharge status: Home / Self Care | Payer: Medicare HMO | Source: Ambulatory Visit | Attending: Radiation Oncology | Admitting: Radiation Oncology

## 2019-09-16 DIAGNOSIS — C61 Malignant neoplasm of prostate: Secondary | ICD-10-CM | POA: Diagnosis not present

## 2019-09-16 DIAGNOSIS — Z510 Encounter for antineoplastic radiation therapy: Secondary | ICD-10-CM | POA: Diagnosis not present

## 2019-09-17 ENCOUNTER — Ambulatory Visit
Admission: RE | Admit: 2019-09-17 | Discharge: 2019-09-17 | Disposition: A | Discharge status: Home / Self Care | Payer: Medicare HMO | Source: Ambulatory Visit | Attending: Radiation Oncology | Admitting: Radiation Oncology

## 2019-09-17 ENCOUNTER — Other Ambulatory Visit: Payer: Self-pay

## 2019-09-17 DIAGNOSIS — Z510 Encounter for antineoplastic radiation therapy: Secondary | ICD-10-CM | POA: Diagnosis not present

## 2019-09-17 DIAGNOSIS — C61 Malignant neoplasm of prostate: Secondary | ICD-10-CM | POA: Diagnosis not present

## 2019-09-18 ENCOUNTER — Ambulatory Visit
Admission: RE | Admit: 2019-09-18 | Discharge: 2019-09-18 | Disposition: A | Discharge status: Home / Self Care | Payer: Medicare HMO | Source: Ambulatory Visit | Attending: Radiation Oncology | Admitting: Radiation Oncology

## 2019-09-18 ENCOUNTER — Other Ambulatory Visit: Payer: Self-pay

## 2019-09-18 DIAGNOSIS — Z510 Encounter for antineoplastic radiation therapy: Secondary | ICD-10-CM | POA: Diagnosis not present

## 2019-09-18 DIAGNOSIS — C61 Malignant neoplasm of prostate: Secondary | ICD-10-CM | POA: Diagnosis not present

## 2019-09-19 ENCOUNTER — Other Ambulatory Visit: Payer: Self-pay

## 2019-09-19 ENCOUNTER — Inpatient Hospital Stay: Payer: Medicare HMO

## 2019-09-19 ENCOUNTER — Ambulatory Visit
Admission: RE | Admit: 2019-09-19 | Discharge: 2019-09-19 | Disposition: A | Discharge status: Home / Self Care | Payer: Medicare HMO | Source: Ambulatory Visit | Attending: Radiation Oncology | Admitting: Radiation Oncology

## 2019-09-19 DIAGNOSIS — C61 Malignant neoplasm of prostate: Secondary | ICD-10-CM | POA: Diagnosis not present

## 2019-09-19 DIAGNOSIS — Z510 Encounter for antineoplastic radiation therapy: Secondary | ICD-10-CM | POA: Diagnosis not present

## 2019-09-20 ENCOUNTER — Inpatient Hospital Stay: Payer: Medicare HMO | Attending: Radiation Oncology

## 2019-09-20 ENCOUNTER — Other Ambulatory Visit: Payer: Self-pay

## 2019-09-20 ENCOUNTER — Ambulatory Visit
Admission: RE | Admit: 2019-09-20 | Discharge: 2019-09-20 | Disposition: A | Discharge status: Home / Self Care | Payer: Medicare HMO | Source: Ambulatory Visit | Attending: Radiation Oncology | Admitting: Radiation Oncology

## 2019-09-20 DIAGNOSIS — C61 Malignant neoplasm of prostate: Secondary | ICD-10-CM

## 2019-09-20 DIAGNOSIS — Z510 Encounter for antineoplastic radiation therapy: Secondary | ICD-10-CM | POA: Diagnosis not present

## 2019-09-20 LAB — CBC
HCT: 40.8 % (ref 39.0–52.0)
Hemoglobin: 13.3 g/dL (ref 13.0–17.0)
MCH: 30.1 pg (ref 26.0–34.0)
MCHC: 32.6 g/dL (ref 30.0–36.0)
MCV: 92.3 fL (ref 80.0–100.0)
Platelets: 179 10*3/uL (ref 150–400)
RBC: 4.42 MIL/uL (ref 4.22–5.81)
RDW: 14.3 % (ref 11.5–15.5)
WBC: 3.6 10*3/uL — ABNORMAL LOW (ref 4.0–10.5)
nRBC: 0 % (ref 0.0–0.2)

## 2019-09-23 ENCOUNTER — Other Ambulatory Visit: Payer: Self-pay

## 2019-09-23 ENCOUNTER — Ambulatory Visit
Admission: RE | Admit: 2019-09-23 | Discharge: 2019-09-23 | Disposition: A | Discharge status: Home / Self Care | Payer: Medicare HMO | Source: Ambulatory Visit | Attending: Radiation Oncology | Admitting: Radiation Oncology

## 2019-09-23 DIAGNOSIS — C61 Malignant neoplasm of prostate: Secondary | ICD-10-CM | POA: Diagnosis not present

## 2019-09-23 DIAGNOSIS — Z510 Encounter for antineoplastic radiation therapy: Secondary | ICD-10-CM | POA: Diagnosis not present

## 2019-09-24 ENCOUNTER — Other Ambulatory Visit: Payer: Self-pay

## 2019-09-24 ENCOUNTER — Ambulatory Visit
Admission: RE | Admit: 2019-09-24 | Discharge: 2019-09-24 | Disposition: A | Discharge status: Home / Self Care | Payer: Medicare HMO | Source: Ambulatory Visit | Attending: Radiation Oncology | Admitting: Radiation Oncology

## 2019-09-24 DIAGNOSIS — D122 Benign neoplasm of ascending colon: Secondary | ICD-10-CM

## 2019-09-24 DIAGNOSIS — C61 Malignant neoplasm of prostate: Secondary | ICD-10-CM | POA: Diagnosis not present

## 2019-09-24 DIAGNOSIS — Z510 Encounter for antineoplastic radiation therapy: Secondary | ICD-10-CM | POA: Diagnosis not present

## 2019-09-25 ENCOUNTER — Other Ambulatory Visit: Payer: Self-pay

## 2019-09-25 ENCOUNTER — Ambulatory Visit
Admission: RE | Admit: 2019-09-25 | Discharge: 2019-09-25 | Disposition: A | Discharge status: Home / Self Care | Payer: Medicare HMO | Source: Ambulatory Visit | Attending: Radiation Oncology | Admitting: Radiation Oncology

## 2019-09-25 DIAGNOSIS — Z510 Encounter for antineoplastic radiation therapy: Secondary | ICD-10-CM | POA: Diagnosis not present

## 2019-09-25 DIAGNOSIS — C61 Malignant neoplasm of prostate: Secondary | ICD-10-CM | POA: Diagnosis not present

## 2019-09-26 ENCOUNTER — Ambulatory Visit
Admission: RE | Admit: 2019-09-26 | Discharge: 2019-09-26 | Disposition: A | Discharge status: Home / Self Care | Payer: Medicare HMO | Source: Ambulatory Visit | Attending: Radiation Oncology | Admitting: Radiation Oncology

## 2019-09-26 ENCOUNTER — Inpatient Hospital Stay: Payer: Medicare HMO

## 2019-09-26 ENCOUNTER — Other Ambulatory Visit: Payer: Self-pay

## 2019-09-26 DIAGNOSIS — C61 Malignant neoplasm of prostate: Secondary | ICD-10-CM | POA: Diagnosis not present

## 2019-09-26 DIAGNOSIS — D122 Benign neoplasm of ascending colon: Secondary | ICD-10-CM

## 2019-09-26 DIAGNOSIS — Z510 Encounter for antineoplastic radiation therapy: Secondary | ICD-10-CM | POA: Diagnosis not present

## 2019-09-26 LAB — CBC WITH DIFFERENTIAL/PLATELET
Abs Immature Granulocytes: 0.01 10*3/uL (ref 0.00–0.07)
Basophils Absolute: 0 10*3/uL (ref 0.0–0.1)
Basophils Relative: 1 %
Eosinophils Absolute: 0.4 10*3/uL (ref 0.0–0.5)
Eosinophils Relative: 8 %
HCT: 40.1 % (ref 39.0–52.0)
Hemoglobin: 12.8 g/dL — ABNORMAL LOW (ref 13.0–17.0)
Immature Granulocytes: 0 %
Lymphocytes Relative: 22 %
Lymphs Abs: 0.9 10*3/uL (ref 0.7–4.0)
MCH: 30.2 pg (ref 26.0–34.0)
MCHC: 31.9 g/dL (ref 30.0–36.0)
MCV: 94.6 fL (ref 80.0–100.0)
Monocytes Absolute: 0.6 10*3/uL (ref 0.1–1.0)
Monocytes Relative: 14 %
Neutro Abs: 2.4 10*3/uL (ref 1.7–7.7)
Neutrophils Relative %: 55 %
Platelets: 180 10*3/uL (ref 150–400)
RBC: 4.24 MIL/uL (ref 4.22–5.81)
RDW: 14.7 % (ref 11.5–15.5)
WBC: 4.3 10*3/uL (ref 4.0–10.5)
nRBC: 0 % (ref 0.0–0.2)

## 2019-09-27 ENCOUNTER — Other Ambulatory Visit: Payer: Self-pay

## 2019-09-27 ENCOUNTER — Ambulatory Visit
Admission: RE | Admit: 2019-09-27 | Discharge: 2019-09-27 | Disposition: A | Discharge status: Home / Self Care | Payer: Medicare HMO | Source: Ambulatory Visit | Attending: Radiation Oncology | Admitting: Radiation Oncology

## 2019-09-27 DIAGNOSIS — C61 Malignant neoplasm of prostate: Secondary | ICD-10-CM | POA: Diagnosis not present

## 2019-09-27 DIAGNOSIS — Z510 Encounter for antineoplastic radiation therapy: Secondary | ICD-10-CM | POA: Diagnosis not present

## 2019-09-30 ENCOUNTER — Ambulatory Visit
Admission: RE | Admit: 2019-09-30 | Discharge: 2019-09-30 | Disposition: A | Discharge status: Home / Self Care | Payer: Medicare HMO | Source: Ambulatory Visit | Attending: Radiation Oncology | Admitting: Radiation Oncology

## 2019-09-30 ENCOUNTER — Other Ambulatory Visit: Payer: Self-pay

## 2019-09-30 ENCOUNTER — Other Ambulatory Visit: Payer: Self-pay | Admitting: *Deleted

## 2019-09-30 DIAGNOSIS — C61 Malignant neoplasm of prostate: Secondary | ICD-10-CM | POA: Diagnosis not present

## 2019-09-30 DIAGNOSIS — Z510 Encounter for antineoplastic radiation therapy: Secondary | ICD-10-CM | POA: Diagnosis not present

## 2019-09-30 MED ORDER — PHENAZOPYRIDINE HCL 200 MG PO TABS: 200.0000 mg | ORAL_TABLET | Freq: Three times a day (TID) | ORAL | 0 refills | Status: DC | PRN

## 2019-10-01 ENCOUNTER — Other Ambulatory Visit: Payer: Self-pay

## 2019-10-01 ENCOUNTER — Ambulatory Visit
Admission: RE | Admit: 2019-10-01 | Discharge: 2019-10-01 | Disposition: A | Discharge status: Home / Self Care | Payer: Medicare HMO | Source: Ambulatory Visit | Attending: Radiation Oncology | Admitting: Radiation Oncology

## 2019-10-01 DIAGNOSIS — Z510 Encounter for antineoplastic radiation therapy: Secondary | ICD-10-CM | POA: Diagnosis not present

## 2019-10-01 DIAGNOSIS — C61 Malignant neoplasm of prostate: Secondary | ICD-10-CM | POA: Diagnosis not present

## 2019-10-02 ENCOUNTER — Other Ambulatory Visit: Payer: Self-pay

## 2019-10-02 ENCOUNTER — Ambulatory Visit
Admission: RE | Admit: 2019-10-02 | Discharge: 2019-10-02 | Disposition: A | Discharge status: Home / Self Care | Payer: Medicare HMO | Source: Ambulatory Visit | Attending: Radiation Oncology | Admitting: Radiation Oncology

## 2019-10-02 DIAGNOSIS — Z510 Encounter for antineoplastic radiation therapy: Secondary | ICD-10-CM | POA: Diagnosis not present

## 2019-10-02 DIAGNOSIS — C61 Malignant neoplasm of prostate: Secondary | ICD-10-CM | POA: Diagnosis not present

## 2019-10-14 ENCOUNTER — Ambulatory Visit (INDEPENDENT_AMBULATORY_CARE_PROVIDER_SITE_OTHER): Payer: Medicare HMO | Admitting: Physician Assistant

## 2019-10-14 ENCOUNTER — Other Ambulatory Visit: Payer: Self-pay

## 2019-10-14 DIAGNOSIS — R339 Retention of urine, unspecified: Secondary | ICD-10-CM

## 2019-10-14 LAB — BLADDER SCAN AMB NON-IMAGING: Scan Result: 747

## 2019-10-14 MED ORDER — TAMSULOSIN HCL 0.4 MG PO CAPS: 0.4000 mg | ORAL_CAPSULE | Freq: Every day | ORAL | 0 refills | Status: DC

## 2019-10-14 NOTE — Progress Notes (Signed)
Simple Catheter Placement  Due to urinary retention patient is present today for a foley cath placement.  Patient was cleaned and prepped in a sterile fashion with betadine and lidocaine jelly 2% was instilled into the urethra.  A 16 FR coud foley catheter was inserted, urine return was noted  689ml, urine was yellow in color.  The balloon was filled with 10cc of sterile water.  A leg bag was attached for drainage. Patient was also given a night bag to take home and was given instruction on how to change from one bag to another.  Patient was given instruction on proper catheter care.  Patient tolerated well, no complications were noted   Performed by: Debroah Loop, PA-C and Elberta Leatherwood, CMA  Additional notes/ Follow up: Start Flomax. Return in about 10 days (around 10/24/2019) for voiding trial.  Results for orders placed or performed in visit on 10/14/19  Bladder Scan (Post Void Residual) in office  Result Value Ref Range   Scan Result 747     I spent 15 min with this patient, of which greater than 50% was spent in counseling and coordination of care with the patient.  Debroah Loop, PA-C  10/14/19 4:18 PM

## 2019-10-15 ENCOUNTER — Ambulatory Visit: Payer: Medicare HMO | Admitting: Physician Assistant

## 2019-10-15 ENCOUNTER — Other Ambulatory Visit: Payer: Self-pay

## 2019-10-15 DIAGNOSIS — Z20822 Contact with and (suspected) exposure to covid-19: Secondary | ICD-10-CM

## 2019-10-16 LAB — NOVEL CORONAVIRUS, NAA: SARS-CoV-2, NAA: NOT DETECTED

## 2019-10-24 ENCOUNTER — Ambulatory Visit: Payer: Medicare HMO | Admitting: Physician Assistant

## 2019-10-24 ENCOUNTER — Other Ambulatory Visit: Payer: Self-pay

## 2019-10-24 ENCOUNTER — Ambulatory Visit (INDEPENDENT_AMBULATORY_CARE_PROVIDER_SITE_OTHER): Payer: Medicare HMO | Admitting: Physician Assistant

## 2019-10-24 ENCOUNTER — Encounter: Payer: Self-pay | Admitting: Physician Assistant

## 2019-10-24 VITALS — BP 145/99 | HR 93 | Ht 69.0 in | Wt 151.9 lb

## 2019-10-24 DIAGNOSIS — R339 Retention of urine, unspecified: Secondary | ICD-10-CM | POA: Diagnosis not present

## 2019-10-24 LAB — BLADDER SCAN AMB NON-IMAGING: Scan Result: 126

## 2019-10-24 NOTE — Progress Notes (Signed)
Fill and Pull Catheter Removal  Patient is present today for a catheter removal.  Patient was cleaned and prepped in a sterile fashion 141ml of sterile water was instilled into the bladder when the patient felt the urge to urinate. 10ml of water was then drained from the balloon.  A 16FR coud foley cath was removed from the bladder complications were noted as: Bladder spasm with immediate reflux of an unmeasured volume of urine .  Patient as then given some time to void on their own.  Patient cannot void on his own and states he no longer needs to.  Patient tolerated well.  Performed by: Debroah Loop, PA-C   Follow up/ Additional notes: Push fluids and urinate at home.  Return to clinic this afternoon for PVR.   Afternoon Follow-up Results for orders placed or performed in visit on 10/24/19  Bladder Scan (Post Void Residual) in office  Result Value Ref Range   Scan Result 126   Voiding trial passed.  No indication for catheter replacement.

## 2019-11-10 ENCOUNTER — Other Ambulatory Visit: Payer: Self-pay | Admitting: Physician Assistant

## 2019-11-10 DIAGNOSIS — R339 Retention of urine, unspecified: Secondary | ICD-10-CM

## 2019-11-13 ENCOUNTER — Other Ambulatory Visit: Payer: Self-pay | Admitting: *Deleted

## 2019-11-13 ENCOUNTER — Ambulatory Visit
Admission: RE | Admit: 2019-11-13 | Discharge: 2019-11-13 | Disposition: A | Discharge status: Home / Self Care | Payer: Medicare HMO | Source: Ambulatory Visit | Attending: Radiation Oncology | Admitting: Radiation Oncology

## 2019-11-13 ENCOUNTER — Encounter: Payer: Self-pay | Admitting: Radiation Oncology

## 2019-11-13 ENCOUNTER — Other Ambulatory Visit: Payer: Self-pay

## 2019-11-13 VITALS — BP 113/66 | HR 103 | Temp 97.6°F | Resp 16 | Wt 144.1 lb

## 2019-11-13 DIAGNOSIS — C61 Malignant neoplasm of prostate: Secondary | ICD-10-CM

## 2019-11-13 DIAGNOSIS — Z923 Personal history of irradiation: Secondary | ICD-10-CM | POA: Insufficient documentation

## 2019-11-13 NOTE — Progress Notes (Signed)
Radiation Oncology Follow up Note  Name: Ronnie Bullock   Date:   11/13/2019 MRN:  [id number] DOB: Jun 03, 1946    This 74 y.o. male presents to the clinic today for 1 month follow-up status post IMRT radiation therapy for Gleason 7 (3+4) adenocarcinoma the prostate presenting with a PSA of 5.7.  REFERRING PROVIDER: Guadalupe Maple, MD  HPI: Patient is a 74 year old male now out 1 month having completed IMRT radiation therapy to his prostate.  Image guided technique.  Seen today in routine follow-up he is doing well.  He specifically denies any increased lower urinary tract symptoms diarrhea or fatigue.  COMPLICATIONS OF TREATMENT: none  FOLLOW UP COMPLIANCE: keeps appointments   PHYSICAL EXAM:  BP 113/66   Pulse (!) 103   Temp 97.6 F (36.4 C) (Tympanic)   Resp 16   Wt 144 lb 1.6 oz (65.4 kg)   BMI 21.28 kg/m  Well-developed well-nourished patient in NAD. HEENT reveals PERLA, EOMI, discs not visualized.  Oral cavity is clear. No oral mucosal lesions are identified. Neck is clear without evidence of cervical or supraclavicular adenopathy. Lungs are clear to A&P. Cardiac examination is essentially unremarkable with regular rate and rhythm without murmur rub or thrill. Abdomen is benign with no organomegaly or masses noted. Motor sensory and DTR levels are equal and symmetric in the upper and lower extremities. Cranial nerves II through XII are grossly intact. Proprioception is intact. No peripheral adenopathy or edema is identified. No motor or sensory levels are noted. Crude visual fields are within normal range.  RADIOLOGY RESULTS: No current films for review  PLAN: Present time patient is doing well with no increased side effect profile from his image guided IMRT radiation therapy for prostate cancer.  I am pleased with his overall progress.  I have asked to see him back in 3 months with a PSA prior to that visit.  Patient knows to call with any concerns at any time.  I would like to  take this opportunity to thank you for allowing me to participate in the care of your patient.Noreene Filbert, MD

## 2019-12-12 ENCOUNTER — Ambulatory Visit (INDEPENDENT_AMBULATORY_CARE_PROVIDER_SITE_OTHER): Payer: Medicare HMO

## 2019-12-12 VITALS — Wt 149.0 lb

## 2019-12-12 DIAGNOSIS — Z0000 Encounter for general adult medical examination without abnormal findings: Secondary | ICD-10-CM | POA: Diagnosis not present

## 2019-12-12 NOTE — Patient Instructions (Addendum)
Ronnie Bullock , Thank you for taking time to come for your Medicare Wellness Visit. I appreciate your ongoing commitment to your health goals. Please review the following plan we discussed and let me know if I can assist you in the future.   Screening recommendations/referrals: Colonoscopy: no longer required  Recommended yearly ophthalmology/optometry visit for glaucoma screening and checkup Recommended yearly dental visit for hygiene and checkup  Vaccinations: Influenza vaccine: due now - will get at next appt.  Pneumococcal vaccine: up to date  Tdap vaccine: up to date  Shingles vaccine: shingrix eligible   Covid-19 vaccine: We are recommending the vaccine to everyone who has not had an allergic reaction to any of the components of the vaccine. If you have specific questions about the vaccine, please bring them up with your health care provider to discuss them.   We will likely not be getting the vaccine in the office for the first rounds of vaccinations. The way they are releasing the vaccines is going to be through the health systems (like [place], [place], [facility], [place]) or through your county health department.   The [facility] Department is giving vaccines to those 75+ starting 11/13/19  M-F 7AM to 4PM [employer] [place] [address] [place], [place] in a drive through tent  If you are 53 and older you can get your vaccine by appointment only at [address] from 8-12 am you can sign up by.texting "VACCINE" to [id number], by calling [phone], or by going to: [email or web address]  You can get more information by going to: [email or web address]    Advanced directives: Please bring a copy of your health care power of attorney and living will to the office at your convenience.  Conditions/risks identified: none  Next appointment: Follow up in one year for your annual wellness visit.   Preventive Care 75 Years and Older,  Male Preventive care refers to lifestyle choices and visits with your health care provider that can promote health and wellness. What does preventive care include?  A yearly physical exam. This is also called an annual well check.  Dental exams once or twice a year.  Routine eye exams. Ask your health care provider how often you should have your eyes checked.  Personal lifestyle choices, including:  Daily care of your teeth and gums.  Regular physical activity.  Eating a healthy diet.  Avoiding tobacco and drug use.  Limiting alcohol use.  Practicing safe sex.  Taking low doses of aspirin every day.  Taking vitamin and mineral supplements as recommended by your health care provider. What happens during an annual well check? The services and screenings done by your health care provider during your annual well check will depend on your age, overall health, lifestyle risk factors, and family history of disease. Counseling  Your health care provider may ask you questions about your:  Alcohol use.  Tobacco use.  Drug use.  Emotional well-being.  Home and relationship well-being.  Sexual activity.  Eating habits.  History of falls.  Memory and ability to understand (cognition).  Work and work Statistician. Screening  You may have the following tests or measurements:  Height, weight, and BMI.  Blood pressure.  Lipid and cholesterol levels. These may be checked every 5 years, or more frequently if you are over 52 years old.  Skin check.  Lung cancer screening. You may have this screening every year starting at age 37 if you have a 30-pack-year history of smoking and  currently smoke or have quit within the past 15 years.  Fecal occult blood test (FOBT) of the stool. You may have this test every year starting at age 3.  Flexible sigmoidoscopy or colonoscopy. You may have a sigmoidoscopy every 5 years or a colonoscopy every 10 years starting at age  47.  Prostate cancer screening. Recommendations will vary depending on your family history and other risks.  Hepatitis C blood test.  Hepatitis B blood test.  Sexually transmitted disease (STD) testing.  Diabetes screening. This is done by checking your blood sugar (glucose) after you have not eaten for a while (fasting). You may have this done every 1-3 years.  Abdominal aortic aneurysm (AAA) screening. You may need this if you are a current or former smoker.  Osteoporosis. You may be screened starting at age 52 if you are at high risk. Talk with your health care provider about your test results, treatment options, and if necessary, the need for more tests. Vaccines  Your health care provider may recommend certain vaccines, such as:  Influenza vaccine. This is recommended every year.  Tetanus, diphtheria, and acellular pertussis (Tdap, Td) vaccine. You may need a Td booster every 10 years.  Zoster vaccine. You may need this after age 45.  Pneumococcal 13-valent conjugate (PCV13) vaccine. One dose is recommended after age 54.  Pneumococcal polysaccharide (PPSV23) vaccine. One dose is recommended after age 41. Talk to your health care provider about which screenings and vaccines you need and how often you need them. This information is not intended to replace advice given to you by your health care provider. Make sure you discuss any questions you have with your health care provider. Document Released: 11/20/2015 Document Revised: 07/13/2016 Document Reviewed: 08/25/2015 Elsevier Interactive Patient Education  2017 [place] Prevention in the Home Falls can cause injuries. They can happen to people of all ages. There are many things you can do to make your home safe and to help prevent falls. What can I do on the outside of my home?  Regularly fix the edges of walkways and driveways and fix any cracks.  Remove anything that might make you trip as you walk through a  door, such as a raised step or threshold.  Trim any bushes or trees on the path to your home.  Use bright outdoor lighting.  Clear any walking paths of anything that might make someone trip, such as rocks or tools.  Regularly check to see if handrails are loose or broken. Make sure that both sides of any steps have handrails.  Any raised decks and porches should have guardrails on the edges.  Have any leaves, snow, or ice cleared regularly.  Use sand or salt on walking paths during winter.  Clean up any spills in your garage right away. This includes oil or grease spills. What can I do in the bathroom?  Use night lights.  Install grab bars by the toilet and in the tub and shower. Do not use towel bars as grab bars.  Use non-skid mats or decals in the tub or shower.  If you need to sit down in the shower, use a plastic, non-slip stool.  Keep the floor dry. Clean up any water that spills on the floor as soon as it happens.  Remove soap buildup in the tub or shower regularly.  Attach bath mats securely with double-sided non-slip rug tape.  Do not have throw rugs and other things on the floor that can make  you trip. What can I do in the bedroom?  Use night lights.  Make sure that you have a light by your bed that is easy to reach.  Do not use any sheets or blankets that are too big for your bed. They should not hang down onto the floor.  Have a firm chair that has side arms. You can use this for support while you get dressed.  Do not have throw rugs and other things on the floor that can make you trip. What can I do in the kitchen?  Clean up any spills right away.  Avoid walking on wet floors.  Keep items that you use a lot in easy-to-reach places.  If you need to reach something above you, use a strong step stool that has a grab bar.  Keep electrical cords out of the way.  Do not use floor polish or wax that makes floors slippery. If you must use wax, use  non-skid floor wax.  Do not have throw rugs and other things on the floor that can make you trip. What can I do with my stairs?  Do not leave any items on the stairs.  Make sure that there are handrails on both sides of the stairs and use them. Fix handrails that are broken or loose. Make sure that handrails are as long as the stairways.  Check any carpeting to make sure that it is firmly attached to the stairs. Fix any carpet that is loose or worn.  Avoid having throw rugs at the top or bottom of the stairs. If you do have throw rugs, attach them to the floor with carpet tape.  Make sure that you have a light switch at the top of the stairs and the bottom of the stairs. If you do not have them, ask someone to add them for you. What else can I do to help prevent falls?  Wear shoes that:  Do not have high heels.  Have rubber bottoms.  Are comfortable and fit you well.  Are closed at the toe. Do not wear sandals.  If you use a stepladder:  Make sure that it is fully opened. Do not climb a closed stepladder.  Make sure that both sides of the stepladder are locked into place.  Ask someone to hold it for you, if possible.  Clearly mark and make sure that you can see:  Any grab bars or handrails.  First and last steps.  Where the edge of each step is.  Use tools that help you move around (mobility aids) if they are needed. These include:  Canes.  Walkers.  Scooters.  Crutches.  Turn on the lights when you go into a dark area. Replace any light bulbs as soon as they burn out.  Set up your furniture so you have a clear path. Avoid moving your furniture around.  If any of your floors are uneven, fix them.  If there are any pets around you, be aware of where they are.  Review your medicines with your doctor. Some medicines can make you feel dizzy. This can increase your chance of falling. Ask your doctor what other things that you can do to help prevent falls. This  information is not intended to replace advice given to you by your health care provider. Make sure you discuss any questions you have with your health care provider. Document Released: 08/20/2009 Document Revised: 03/31/2016 Document Reviewed: 11/28/2014 Elsevier Interactive Patient Education  2017 [employer].

## 2019-12-12 NOTE — Progress Notes (Signed)
Subjective:   Ronnie Bullock is a 74 y.o. male who presents for Medicare Annual/Subsequent preventive examination.  This visit is being conducted via phone call  - after an attmept to do on video chat - due to the COVID-19 pandemic. This patient has given me verbal consent via phone to conduct this visit, patient states they are participating from their home address. Some vital signs may be absent or patient reported.   Patient identification: identified by name, DOB, and current address.    Review of Systems:   Cardiac Risk Factors include: male gender;advanced age (>34men, >45 women);hypertension     Objective:    Vitals: Wt 149 lb (67.6 kg)   BMI 22.00 kg/m   Body mass index is 22 kg/m.  Advanced Directives 12/12/2019 11/13/2019 07/04/2019 12/10/2018 05/16/2018 11/10/2017 11/10/2017  Does Patient Have a Medical Advance Directive? Yes No Unable to assess, patient is non-responsive or altered mental status No No No No  Type of Advance Directive Living will;Healthcare Power of Attorney - - - - - -  Copy of [place] in Chart? No - copy requested - - - - - -  Would patient like information on creating a medical advance directive? - No - Patient declined - Yes (MAU/Ambulatory/Procedural Areas - Information given) - Yes (MAU/Ambulatory/Procedural Areas - Information given) Yes (MAU/Ambulatory/Procedural Areas - Information given)    Tobacco Social History   Tobacco Use  Smoking Status Never Smoker  Smokeless Tobacco Never Used     Counseling given: Not Answered   Clinical Intake:  Pre-visit preparation completed: Yes  Pain : No/denies pain     Nutritional Risks: None Diabetes: No  How often do you need to have someone help you when you read instructions, pamphlets, or other written materials from your doctor or pharmacy?: 1 - Never  Interpreter Needed?: No  Information entered by :: Idaliz Tinkle,LPN  Past Medical History:  Diagnosis Date  .  Hyperlipidemia   . Hypertension    Past Surgical History:  Procedure Laterality Date  . COLONOSCOPY WITH PROPOFOL N/A 05/16/2018   Procedure: COLONOSCOPY WITH PROPOFOL;  Surgeon: Virgel Manifold, MD;  Location: [facility] ENDOSCOPY;  Service: Endoscopy;  Laterality: N/A;   Family History  Problem Relation Age of Onset  . Hypertension Mother   . Prostate cancer Neg Hx   . Bladder Cancer Neg Hx   . Kidney cancer Neg Hx    Social History   Socioeconomic History  . Marital status: Widowed    Spouse name: Not on file  . Number of children: Not on file  . Years of education: Not on file  . Highest education level: 12th grade  Occupational History  . Not on file  Tobacco Use  . Smoking status: Never Smoker  . Smokeless tobacco: Never Used  Substance and Sexual Activity  . Alcohol use: No    Alcohol/week: 0.0 standard drinks  . Drug use: No  . Sexual activity: Yes  Other Topics Concern  . Not on file  Social History Narrative  . Not on file   Social Determinants of Health   Financial Resource Strain:   . Difficulty of Paying Living Expenses: Not on file  Food Insecurity:   . Worried About Charity fundraiser in the Last Year: Not on file  . Ran Out of Food in the Last Year: Not on file  Transportation Needs:   . Lack of Transportation (Medical): Not on file  . Lack of Transportation (  Non-Medical): Not on file  Physical Activity:   . Days of Exercise per Week: Not on file  . Minutes of Exercise per Session: Not on file  Stress:   . Feeling of Stress : Not on file  Social Connections:   . Frequency of Communication with Friends and Family: Not on file  . Frequency of Social Gatherings with Friends and Family: Not on file  . Attends Religious Services: Not on file  . Active Member of Clubs or Organizations: Not on file  . Attends Archivist Meetings: Not on file  . Marital Status: Not on file    Outpatient Encounter Medications as of 12/12/2019  Medication  Sig  . amLODipine (NORVASC) 5 MG tablet Take 1 tablet (5 mg total) by mouth daily.  . [DISCONTINUED] phenazopyridine (PYRIDIUM) 200 MG tablet Take 1 tablet (200 mg total) by mouth 3 (three) times daily as needed for pain. (Patient not taking: Reported on 12/12/2019)  . [DISCONTINUED] tamsulosin (FLOMAX) 0.4 MG CAPS capsule TAKE 1 CAPSULE BY MOUTH EVERY DAY (Patient not taking: Reported on 12/12/2019)   No facility-administered encounter medications on file as of 12/12/2019.    Activities of Daily Living In your present state of health, do you have any difficulty performing the following activities: 12/12/2019  Hearing? N  Comment no hearing aids  Vision? Y  Comment no eyeglasses, no eye dr.  Difficulty concentrating or making decisions? N  Walking or climbing stairs? N  Dressing or bathing? N  Doing errands, shopping? N  Preparing Food and eating ? N  Using the Toilet? N  In the past six months, have you accidently leaked urine? N  Do you have problems with loss of bowel control? N  Managing your Medications? N  Managing your Finances? N  Housekeeping or managing your Housekeeping? N  Some recent data might be hidden    Patient Care Team: Guadalupe Maple, MD as PCP - General (Family Medicine) Abbie Sons, MD (Urology)   Assessment:   This is a routine wellness examination for [place].  Exercise Activities and Dietary recommendations Current Exercise Habits: The patient has a physically strenuous job, but has no regular exercise apart from work.(works at [employer], gets a lot of walking.), Exercise limited by: None identified  Goals    . DIET - INCREASE WATER INTAKE     Recommend drinking at least 6-8 glasses of water a day        Fall Risk: Fall Risk  12/12/2019 01/01/2019 12/10/2018 11/10/2017 10/10/2017  Falls in the past year? 0 0 0 No No  Number falls in past yr: 0 - 0 - -  Injury with Fall? 0 - - - -  Follow up - Falls evaluation completed - - -    FALL RISK PREVENTION  PERTAINING TO THE HOME:  Any stairs in or around the home? Yes  If so, are there any without handrails? No   Home free of loose throw rugs in walkways, pet beds, electrical cords, etc? Yes  Adequate lighting in your home to reduce risk of falls? Yes   ASSISTIVE DEVICES UTILIZED TO PREVENT FALLS:  Life alert? No  Use of a cane, walker or w/c? No  Grab bars in the bathroom? No  Shower chair or bench in shower? No  Elevated toilet seat or a handicapped toilet? No   TIMED UP AND GO:  Unable to perform   Depression Screen [facility] 2/9 Scores 12/12/2019 12/10/2018 11/10/2017 10/10/2017  PHQ -  2 Score 0 0 0 0  PHQ- 9 Score - - - 0    Cognitive Function     6CIT Screen 12/10/2018 11/10/2017  What Year? 0 points 0 points  What month? 0 points 0 points  What time? 0 points 0 points  Count back from 20 0 points 0 points  Months in reverse 0 points 0 points  Repeat phrase 8 points 10 points  Total Score 8 10    Immunization History  Administered Date(s) Administered  . Influenza, High Dose Seasonal PF 10/10/2017, 12/10/2018  . Pneumococcal Conjugate-13 10/10/2017  . Pneumococcal Polysaccharide-23 12/10/2018  . Td 01/01/2019    Qualifies for Shingles Vaccine? Yes  Zostavax completed n/a. Due for Shingrix. Education has been provided regarding the importance of this vaccine. Pt has been advised to call insurance company to determine out of pocket expense. Advised may also receive vaccine at local pharmacy or Health Dept. Verbalized acceptance and understanding.  Tdap: up to date   Flu Vaccine: up to date   Pneumococcal Vaccine: up to date   Screening Tests Health Maintenance  Topic Date Due  . INFLUENZA VACCINE  06/08/2019  . COLONOSCOPY  05/17/2023  . TETANUS/TDAP  01/01/2029  . Hepatitis C Screening  Completed  . PNA vac Low Risk Adult  Completed   Cancer Screenings:  Colorectal Screening: no longer required   Lung Cancer Screening: (Low Dose CT Chest recommended if Age 75-80  years, 30 pack-year currently smoking OR have quit w/in 15years.) does not qualify.     Additional Screening:  Hepatitis C Screening: does qualify; Completed 01/01/2019  Vision Screening: Recommended annual ophthalmology exams for early detection of glaucoma and other disorders of the eye. Is the patient up to date with their annual eye exam?  No   Dental Screening: Recommended annual dental exams for proper oral hygiene  Community Resource Referral:  CRR required this visit?  No        Plan:  I have personally reviewed and addressed the Medicare Annual Wellness questionnaire and have noted the following in the patient's chart:  A. Medical and social history B. Use of alcohol, tobacco or illicit drugs  C. Current medications and supplements D. Functional ability and status E.  Nutritional status F.  Physical activity G. Advance directives H. List of other physicians I.  Hospitalizations, surgeries, and ER visits in previous 12 months J.  [place] such as hearing and vision if needed, cognitive and depression L. Referrals and appointments   In addition, I have reviewed and discussed with patient certain preventive protocols, quality metrics, and best practice recommendations. A written personalized care plan for preventive services as well as general preventive health recommendations were provided to patient.   Signed,   Bevelyn Ngo, LPN  [id number] Nurse Health Advisor   Nurse Notes: none

## 2020-01-03 ENCOUNTER — Encounter: Payer: Medicare HMO | Admitting: Family Medicine

## 2020-01-23 ENCOUNTER — Other Ambulatory Visit: Payer: Self-pay

## 2020-01-23 ENCOUNTER — Encounter: Payer: Self-pay | Admitting: Family Medicine

## 2020-01-23 ENCOUNTER — Telehealth: Payer: Self-pay | Admitting: Family Medicine

## 2020-01-23 ENCOUNTER — Ambulatory Visit (INDEPENDENT_AMBULATORY_CARE_PROVIDER_SITE_OTHER): Payer: Medicare HMO | Admitting: Family Medicine

## 2020-01-23 VITALS — BP 156/91 | HR 74 | Temp 97.6°F | Ht 69.6 in | Wt 149.8 lb

## 2020-01-23 DIAGNOSIS — E7800 Pure hypercholesterolemia, unspecified: Secondary | ICD-10-CM | POA: Diagnosis not present

## 2020-01-23 DIAGNOSIS — Z0000 Encounter for general adult medical examination without abnormal findings: Secondary | ICD-10-CM

## 2020-01-23 DIAGNOSIS — C61 Malignant neoplasm of prostate: Secondary | ICD-10-CM | POA: Diagnosis not present

## 2020-01-23 DIAGNOSIS — R8281 Pyuria: Secondary | ICD-10-CM

## 2020-01-23 DIAGNOSIS — Z23 Encounter for immunization: Secondary | ICD-10-CM

## 2020-01-23 DIAGNOSIS — R972 Elevated prostate specific antigen [PSA]: Secondary | ICD-10-CM | POA: Diagnosis not present

## 2020-01-23 DIAGNOSIS — I10 Essential (primary) hypertension: Secondary | ICD-10-CM | POA: Diagnosis not present

## 2020-01-23 MED ORDER — AMLODIPINE BESYLATE 10 MG PO TABS: 10.0000 mg | ORAL_TABLET | Freq: Every day | ORAL | 0 refills | Status: DC

## 2020-01-23 NOTE — Progress Notes (Signed)
BP (!) 156/91 (BP Location: Left Arm, Cuff Size: Normal)   Pulse 74   Temp 97.6 F (36.4 C) (Oral)   Ht 5' 9.6" (1.768 m)   Wt 149 lb 12.8 oz (67.9 kg)   SpO2 93%   BMI 21.74 kg/m    Subjective:    Patient ID: Ronnie Bullock, male    DOB: 09/12/1946, 74 y.o.   MRN: [id number]  HPI: Ronnie Bullock is a 74 y.o. male presenting on 01/23/2020 for comprehensive medical examination. Current medical complaints include:  HYPERTENSION / HYPERLIPIDEMIA Satisfied with current treatment? yes Duration of hypertension: chronic BP monitoring frequency: not checking BP range:  BP medication side effects: no Past BP meds: amlodipine Duration of hyperlipidemia: chronic Cholesterol medication side effects: no Cholesterol supplements: none Past cholesterol medications: none Medication compliance: excellent compliance Aspirin: no Recent stressors: no Recurrent headaches: no Visual changes: no Palpitations: no Dyspnea: no Chest pain: no Lower extremity edema: no Dizzy/lightheaded: no  Interim Problems from his last visit: no  Depression Screen done today and results listed below:  Depression screen [facility] 2/9 01/23/2020 12/12/2019 12/10/2018 11/10/2017 10/10/2017  Decreased Interest 0 0 0 0 0  Down, Depressed, Hopeless 0 0 0 0 0  PHQ - 2 Score 0 0 0 0 0  Altered sleeping - - - - 0  Tired, decreased energy - - - - 0  Change in appetite - - - - 0  Feeling bad or failure about yourself  - - - - 0  Trouble concentrating - - - - 0  Moving slowly or fidgety/restless - - - - 0  Suicidal thoughts - - - - 0  PHQ-9 Score - - - - 0    Past Medical History:  Past Medical History:  Diagnosis Date  . Hyperlipidemia   . Hypertension     Surgical History:  Past Surgical History:  Procedure Laterality Date  . COLONOSCOPY WITH PROPOFOL N/A 05/16/2018   Procedure: COLONOSCOPY WITH PROPOFOL;  Surgeon: Virgel Manifold, MD;  Location: [facility] ENDOSCOPY;  Service: Endoscopy;  Laterality: N/A;     Medications:  No current outpatient medications on file prior to visit.   No current facility-administered medications on file prior to visit.    Allergies:  No Known Allergies  Social History:  Social History   Socioeconomic History  . Marital status: Widowed    Spouse name: Not on file  . Number of children: Not on file  . Years of education: Not on file  . Highest education level: 12th grade  Occupational History  . Not on file  Tobacco Use  . Smoking status: Never Smoker  . Smokeless tobacco: Never Used  Substance and Sexual Activity  . Alcohol use: No    Alcohol/week: 0.0 standard drinks  . Drug use: No  . Sexual activity: Yes  Other Topics Concern  . Not on file  Social History Narrative  . Not on file   Social Determinants of Health   Financial Resource Strain:   . Difficulty of Paying Living Expenses:   Food Insecurity:   . Worried About Charity fundraiser in the Last Year:   . Arboriculturist in the Last Year:   Transportation Needs:   . Film/video editor (Medical):   Marland Kitchen Lack of Transportation (Non-Medical):   Physical Activity:   . Days of Exercise per Week:   . Minutes of Exercise per Session:   Stress:   . Feeling of Stress :  Social Connections:   . Frequency of Communication with Friends and Family:   . Frequency of Social Gatherings with Friends and Family:   . Attends Religious Services:   . Active Member of Clubs or Organizations:   . Attends Archivist Meetings:   Marland Kitchen Marital Status:   Intimate Partner Violence:   . Fear of Current or Ex-Partner:   . Emotionally Abused:   Marland Kitchen Physically Abused:   . Sexually Abused:    Social History   Tobacco Use  Smoking Status Never Smoker  Smokeless Tobacco Never Used   Social History   Substance and Sexual Activity  Alcohol Use No  . Alcohol/week: 0.0 standard drinks    Family History:  Family History  Problem Relation Age of Onset  . Hypertension Mother   . Prostate  cancer Neg Hx   . Bladder Cancer Neg Hx   . Kidney cancer Neg Hx     Past medical history, surgical history, medications, allergies, family history and social history reviewed with patient today and changes made to appropriate areas of the chart.   Review of Systems  Constitutional: Negative.   HENT: Negative.   Eyes: Negative.   Respiratory: Negative.   Cardiovascular: Negative.   Gastrointestinal: Positive for constipation. Negative for abdominal pain, blood in stool, diarrhea, heartburn, melena, nausea and vomiting.  Genitourinary: Negative.   Musculoskeletal: Negative.   Skin: Negative.   Neurological: Negative.   Endo/Heme/Allergies: Negative.   Psychiatric/Behavioral: Negative.     All other ROS negative except what is listed above and in the HPI.      Objective:    BP (!) 156/91 (BP Location: Left Arm, Cuff Size: Normal)   Pulse 74   Temp 97.6 F (36.4 C) (Oral)   Ht 5' 9.6" (1.768 m)   Wt 149 lb 12.8 oz (67.9 kg)   SpO2 93%   BMI 21.74 kg/m   Wt Readings from Last 3 Encounters:  01/23/20 149 lb 12.8 oz (67.9 kg)  12/12/19 149 lb (67.6 kg)  11/13/19 144 lb 1.6 oz (65.4 kg)    Physical Exam Vitals and nursing note reviewed.  Constitutional:      General: He is not in acute distress.    Appearance: Normal appearance. He is normal weight. He is not ill-appearing, toxic-appearing or diaphoretic.  HENT:     Head: Normocephalic and atraumatic.     Right Ear: Tympanic membrane, ear canal and external ear normal. There is no impacted cerumen.     Left Ear: Tympanic membrane, ear canal and external ear normal. There is no impacted cerumen.     Nose: Nose normal. No congestion or rhinorrhea.     Mouth/Throat:     Mouth: Mucous membranes are moist.     Pharynx: Oropharynx is clear. No oropharyngeal exudate or posterior oropharyngeal erythema.  Eyes:     General: No scleral icterus.       Right eye: No discharge.        Left eye: No discharge.     Extraocular  Movements: Extraocular movements intact.     Conjunctiva/sclera: Conjunctivae normal.     Pupils: Pupils are equal, round, and reactive to light.  Neck:     Vascular: No carotid bruit.  Cardiovascular:     Rate and Rhythm: Normal rate and regular rhythm.     Pulses: Normal pulses.     Heart sounds: No murmur. No friction rub. No gallop.   Pulmonary:     Effort: Pulmonary effort  is normal. No respiratory distress.     Breath sounds: Normal breath sounds. No stridor. No wheezing, rhonchi or rales.  Chest:     Chest wall: No tenderness.  Abdominal:     General: Abdomen is flat. Bowel sounds are normal. There is no distension.     Palpations: Abdomen is soft. There is no mass.     Tenderness: There is no abdominal tenderness. There is no right CVA tenderness, left CVA tenderness, guarding or rebound.     Hernia: No hernia is present.  Genitourinary:    Comments: Genital exam deferred with shared decision making Musculoskeletal:        General: No swelling, tenderness, deformity or signs of injury.     Cervical back: Normal range of motion and neck supple. No rigidity. No muscular tenderness.     Right lower leg: No edema.     Left lower leg: No edema.  Lymphadenopathy:     Cervical: No cervical adenopathy.  Skin:    General: Skin is warm and dry.     Capillary Refill: Capillary refill takes less than 2 seconds.     Coloration: Skin is not jaundiced or pale.     Findings: No bruising, erythema, lesion or rash.  Neurological:     General: No focal deficit present.     Mental Status: He is alert and oriented to person, place, and time.     Cranial Nerves: No cranial nerve deficit.     Sensory: No sensory deficit.     Motor: No weakness.     Coordination: Coordination normal.     Gait: Gait normal.     Deep Tendon Reflexes: Reflexes normal.  Psychiatric:        Mood and Affect: Mood normal.        Behavior: Behavior normal.        Thought Content: Thought content normal.         Judgment: Judgment normal.     Results for orders placed or performed in visit on 01/23/20  Microscopic Examination   URINE  Result Value Ref Range   WBC, UA 11-30 (A) 0 - 5 /hpf   RBC None seen 0 - 2 /hpf   Epithelial Cells (non renal) 0-10 0 - 10 /hpf   Casts Present None seen /lpf   Cast Type Granular casts (A) N/A   Bacteria, UA None seen None seen/Few  Urine Culture, Reflex   URINE  Result Value Ref Range   Urine Culture, Routine WILL FOLLOW   UA/M w/rflx Culture, Routine   Specimen: Urine   URINE  Result Value Ref Range   Specific Gravity, UA 1.020 1.005 - 1.030   pH, UA 6.0 5.0 - 7.5   Color, UA Yellow Yellow   Appearance Ur Clear Clear   Leukocytes,UA Trace (A) Negative   Protein,UA 2+ (A) Negative/Trace   Glucose, UA Negative Negative   Ketones, UA Negative Negative   RBC, UA Negative Negative   Bilirubin, UA Negative Negative   Urobilinogen, Ur 1.0 0.2 - 1.0 mg/dL   Nitrite, UA Negative Negative   Microscopic Examination See below:    Urinalysis Reflex Comment       Assessment & Plan:   Problem List Items Addressed This Visit      Cardiovascular and Mediastinum   Essential hypertension, benign    Not under good control. Will increase his amlodipine and recheck 1 month. Call with any concerns.       Relevant Medications  amLODipine (NORVASC) 10 MG tablet   Other Relevant Orders   CBC with Differential/Platelet   Comprehensive metabolic panel   TSH   UA/M w/rflx Culture, Routine (Completed)     Genitourinary   Prostate cancer [facility])    Following with oncology. Continue to monitor. Call with any concerns.       Relevant Orders   PSA     Other   Hypercholesteremia    Under good control on current regimen. Continue current regimen. Continue to monitor. Call with any concerns. Refills given. Labs drawn today.       Relevant Medications   amLODipine (NORVASC) 10 MG tablet   Other Relevant Orders   Lipid Panel w/o Chol/HDL Ratio   Elevated  PSA    Following with oncology. Continue to monitor. Call with any concerns.       Relevant Orders   PSA    Other Visit Diagnoses    Routine general medical examination at a health care facility    -  Primary   Vaccines up to date. Screening labs checked today. Continue diet and exercise. Call with any concerns.    Need for influenza vaccination       Flu shot given today.   Relevant Orders   Flu Vaccine QUAD High Dose(Fluad) (Completed)       Discussed aspirin prophylaxis for myocardial infarction prevention and decision was made to stop ASA  LABORATORY TESTING:  Health maintenance labs ordered today as discussed above.   The natural history of prostate cancer and ongoing controversy regarding screening and potential treatment outcomes of prostate cancer has been discussed with the patient. The meaning of a false positive PSA and a false negative PSA has been discussed. He indicates understanding of the limitations of this screening test and wishes to proceed with screening PSA testing.   IMMUNIZATIONS:   - Tdap: Tetanus vaccination status reviewed: last tetanus booster within 10 years. - Influenza: Up to date - Pneumovax: Up to date - Prevnar: Up to date  SCREENING: - Colonoscopy: Up to date  Discussed with patient purpose of the colonoscopy is to detect colon cancer at curable precancerous or early stages   PATIENT COUNSELING:    Sexuality: Discussed sexually transmitted diseases, partner selection, use of condoms, avoidance of unintended pregnancy  and contraceptive alternatives.   Advised to avoid cigarette smoking.  I discussed with the patient that most people either abstain from alcohol or drink within safe limits (<=14/week and <=4 drinks/occasion for males, <=7/weeks and <= 3 drinks/occasion for females) and that the risk for alcohol disorders and other health effects rises proportionally with the number of drinks per week and how often a drinker exceeds daily  limits.  Discussed cessation/primary prevention of drug use and availability of treatment for abuse.   Diet: Encouraged to adjust caloric intake to maintain  or achieve ideal body weight, to reduce intake of dietary saturated fat and total fat, to limit sodium intake by avoiding high sodium foods and not adding table salt, and to maintain adequate dietary potassium and calcium preferably from fresh fruits, vegetables, and low-fat dairy products.    stressed the importance of regular exercise  Injury prevention: Discussed safety belts, safety helmets, smoke detector, smoking near bedding or upholstery.   Dental health: Discussed importance of regular tooth brushing, flossing, and dental visits.   Follow up plan: NEXT PREVENTATIVE PHYSICAL DUE IN 1 YEAR. Return in about 4 weeks (around 02/20/2020) for bp.

## 2020-01-23 NOTE — Assessment & Plan Note (Signed)
Under good control on current regimen. Continue current regimen. Continue to monitor. Call with any concerns. Refills given. Labs drawn today.   

## 2020-01-23 NOTE — Assessment & Plan Note (Signed)
Following with oncology. Continue to monitor. Call with any concerns.  

## 2020-01-23 NOTE — Telephone Encounter (Signed)
Called pt's caregiver back advised him 1 month f/u is for bp and told him that pt did get his flu shot

## 2020-01-23 NOTE — Telephone Encounter (Signed)
Copied from [place] [id number]. Topic: General - Other >> Jan 23, 2020  9:57 AM Keene Breath wrote: Reason for CRM: Patient's caregiver, Leanor Kail, called with the patient to ask the nurse to call him regarding the follow-up appt. Scheduled for April.  Patient does not understand why he has to see the doctor at that time.  CB# [phone]

## 2020-01-23 NOTE — Telephone Encounter (Signed)
Please also let him know that we increased his amlodipine- so if he sees 10mg  instead of 5, that is correct. Thanks.

## 2020-01-23 NOTE — Telephone Encounter (Signed)
Patient's caregiver notified.

## 2020-01-23 NOTE — Patient Instructions (Addendum)
Influenza (Flu) Vaccine (Inactivated or Recombinant): What You Need to Know 1. Why get vaccinated? Influenza vaccine can prevent influenza (flu). Flu is a contagious disease that spreads around the [place] every year, usually between October and May. Anyone can get the flu, but it is more dangerous for some people. Infants and young children, people 75 years of age and older, pregnant women, and people with certain health conditions or a weakened immune system are at greatest risk of flu complications. Pneumonia, bronchitis, sinus infections and ear infections are examples of flu-related complications. If you have a medical condition, such as heart disease, cancer or diabetes, flu can make it worse. Flu can cause fever and chills, sore throat, muscle aches, fatigue, cough, headache, and runny or stuffy nose. Some people may have vomiting and diarrhea, though this is more common in children than adults. Each year thousands of people in the [place] die from flu, and many more are hospitalized. Flu vaccine prevents millions of illnesses and flu-related visits to the doctor each year. 2. Influenza vaccine CDC recommends everyone 57 months of age and older get vaccinated every flu season. Children 6 months through 2 years of age may need 2 doses during a single flu season. Everyone else needs only 1 dose each flu season. It takes about 2 weeks for protection to develop after vaccination. There are many flu viruses, and they are always changing. Each year a new flu vaccine is made to protect against three or four viruses that are likely to cause disease in the upcoming flu season. Even when the vaccine doesn't exactly match these viruses, it may still provide some protection. Influenza vaccine does not cause flu. Influenza vaccine may be given at the same time as other vaccines. 3. Talk with your health care provider Tell your vaccine provider if the person getting the vaccine:  Has had an  allergic reaction after a previous dose of influenza vaccine, or has any severe, life-threatening allergies.  Has ever had Guillain-Barr Syndrome (also called GBS). In some cases, your health care provider may decide to postpone influenza vaccination to a future visit. People with minor illnesses, such as a cold, may be vaccinated. People who are moderately or severely ill should usually wait until they recover before getting influenza vaccine. Your health care provider can give you more information. 4. Risks of a vaccine reaction  Soreness, redness, and swelling where shot is given, fever, muscle aches, and headache can happen after influenza vaccine.  There may be a very small increased risk of Guillain-Barr Syndrome (GBS) after inactivated influenza vaccine (the flu shot). Young children who get the flu shot along with pneumococcal vaccine (PCV13), and/or DTaP vaccine at the same time might be slightly more likely to have a seizure caused by fever. Tell your health care provider if a child who is getting flu vaccine has ever had a seizure. People sometimes faint after medical procedures, including vaccination. Tell your provider if you feel dizzy or have vision changes or ringing in the ears. As with any medicine, there is a very remote chance of a vaccine causing a severe allergic reaction, other serious injury, or death. 5. What if there is a serious problem? An allergic reaction could occur after the vaccinated person leaves the clinic. If you see signs of a severe allergic reaction (hives, swelling of the face and throat, difficulty breathing, a fast heartbeat, dizziness, or weakness), call 9-1-1 and get the person to the nearest hospital. For other signs that  concern you, call your health care provider. Adverse reactions should be reported to the Vaccine Adverse Event Reporting System (VAERS). Your health care provider will usually file this report, or you can do it yourself. Visit the  VAERS website at www.vaers.SamedayNews.es or call (737) 630-1181.VAERS is only for reporting reactions, and VAERS staff do not give medical advice. 6. The National Vaccine Injury Compensation Program The [employer] Vaccine Injury Compensation Program (VICP) is a federal program that was created to compensate people who may have been injured by certain vaccines. Visit the VICP website at GoldCloset.com.ee or call (506)502-7489 to learn about the program and about filing a claim. There is a time limit to file a claim for compensation. 7. How can I learn more?  Ask your healthcare provider.  Call your local or state health department.  Contact the Centers for Disease Control and Prevention (CDC): ? Call (985) 475-2487 (1-800-CDC-INFO) or ? Visit CDC's https://gibson.com/ Vaccine Information Statement (Interim) Inactivated Influenza Vaccine (06/21/2018) This information is not intended to replace advice given to you by your health care provider. Make sure you discuss any questions you have with your health care provider. Document Revised: 02/12/2019 Document Reviewed: 06/25/2018 Elsevier Patient Education  [place] Maintenance After Age 70 After age 74, you are at a higher risk for certain long-term diseases and infections as well as injuries from falls. Falls are a major cause of broken bones and head injuries in people who are older than age 64. Getting regular preventive care can help to keep you healthy and well. Preventive care includes getting regular testing and making lifestyle changes as recommended by your health care provider. Talk with your health care provider about:  Which screenings and tests you should have. A screening is a test that checks for a disease when you have no symptoms.  A diet and exercise plan that is right for you. What should I know about screenings and tests to prevent falls? Screening and testing are the best ways to find a health problem  early. Early diagnosis and treatment give you the best chance of managing medical conditions that are common after age 38. Certain conditions and lifestyle choices may make you more likely to have a fall. Your health care provider may recommend:  Regular vision checks. Poor vision and conditions such as cataracts can make you more likely to have a fall. If you wear glasses, make sure to get your prescription updated if your vision changes.  Medicine review. Work with your health care provider to regularly review all of the medicines you are taking, including over-the-counter medicines. Ask your health care provider about any side effects that may make you more likely to have a fall. Tell your health care provider if any medicines that you take make you feel dizzy or sleepy.  Osteoporosis screening. Osteoporosis is a condition that causes the bones to get weaker. This can make the bones weak and cause them to break more easily.  Blood pressure screening. Blood pressure changes and medicines to control blood pressure can make you feel dizzy.  Strength and balance checks. Your health care provider may recommend certain tests to check your strength and balance while standing, walking, or changing positions.  Foot health exam. Foot pain and numbness, as well as not wearing proper footwear, can make you more likely to have a fall.  Depression screening. You may be more likely to have a fall if you have a fear of falling, feel emotionally low, or feel unable  to do activities that you used to do.  Alcohol use screening. Using too much alcohol can affect your balance and may make you more likely to have a fall. What actions can I take to lower my risk of falls? General instructions  Talk with your health care provider about your risks for falling. Tell your health care provider if: ? You fall. Be sure to tell your health care provider about all falls, even ones that seem minor. ? You feel dizzy, sleepy,  or off-balance.  Take over-the-counter and prescription medicines only as told by your health care provider. These include any supplements.  Eat a healthy diet and maintain a healthy weight. A healthy diet includes low-fat dairy products, low-fat (lean) meats, and fiber from whole grains, beans, and lots of fruits and vegetables. Home safety  Remove any tripping hazards, such as rugs, cords, and clutter.  Install safety equipment such as grab bars in bathrooms and safety rails on stairs.  Keep rooms and walkways well-lit. Activity   Follow a regular exercise program to stay fit. This will help you maintain your balance. Ask your health care provider what types of exercise are appropriate for you.  If you need a cane or walker, use it as recommended by your health care provider.  Wear supportive shoes that have nonskid soles. Lifestyle  Do not drink alcohol if your health care provider tells you not to drink.  If you drink alcohol, limit how much you have: ? 0-1 drink a day for women. ? 0-2 drinks a day for men.  Be aware of how much alcohol is in your drink. In the [place] one drink equals one typical bottle of beer (12 oz), one-half glass of wine (5 oz), or one shot of hard liquor (1 oz).  Do not use any products that contain nicotine or tobacco, such as cigarettes and e-cigarettes. If you need help quitting, ask your health care provider. Summary  Having a healthy lifestyle and getting preventive care can help to protect your health and wellness after age 93.  Screening and testing are the best way to find a health problem early and help you avoid having a fall. Early diagnosis and treatment give you the best chance for managing medical conditions that are more common for people who are older than age 45.  Falls are a major cause of broken bones and head injuries in people who are older than age 23. Take precautions to prevent a fall at home.  Work with your health care  provider to learn what changes you can make to improve your health and wellness and to prevent falls. This information is not intended to replace advice given to you by your health care provider. Make sure you discuss any questions you have with your health care provider. Document Revised: 02/14/2019 Document Reviewed: 09/06/2017 Elsevier Patient Education  2020 [employer].

## 2020-01-23 NOTE — Assessment & Plan Note (Signed)
Not under good control. Will increase his amlodipine and recheck 1 month. Call with any concerns.

## 2020-01-24 LAB — CBC WITH DIFFERENTIAL/PLATELET
Basophils Absolute: 0.1 10*3/uL (ref 0.0–0.2)
Basos: 1 %
EOS (ABSOLUTE): 0.5 10*3/uL — ABNORMAL HIGH (ref 0.0–0.4)
Eos: 9 %
Hematocrit: 38.8 % (ref 37.5–51.0)
Hemoglobin: 13.5 g/dL (ref 13.0–17.7)
Immature Grans (Abs): 0 10*3/uL (ref 0.0–0.1)
Immature Granulocytes: 0 %
Lymphocytes Absolute: 1.5 10*3/uL (ref 0.7–3.1)
Lymphs: 29 %
MCH: 30.4 pg (ref 26.6–33.0)
MCHC: 34.8 g/dL (ref 31.5–35.7)
MCV: 87 fL (ref 79–97)
Monocytes Absolute: 0.6 10*3/uL (ref 0.1–0.9)
Monocytes: 12 %
Neutrophils Absolute: 2.6 10*3/uL (ref 1.4–7.0)
Neutrophils: 49 %
Platelets: 218 10*3/uL (ref 150–450)
RBC: 4.44 x10E6/uL (ref 4.14–5.80)
RDW: 13.2 % (ref 11.6–15.4)
WBC: 5.2 10*3/uL (ref 3.4–10.8)

## 2020-01-24 LAB — COMPREHENSIVE METABOLIC PANEL
ALT: 7 IU/L (ref 0–44)
AST: 19 IU/L (ref 0–40)
Albumin/Globulin Ratio: 1 — ABNORMAL LOW (ref 1.2–2.2)
Albumin: 3.5 g/dL — ABNORMAL LOW (ref 3.7–4.7)
Alkaline Phosphatase: 92 IU/L (ref 39–117)
BUN/Creatinine Ratio: 11 (ref 10–24)
BUN: 13 mg/dL (ref 8–27)
Bilirubin Total: 0.4 mg/dL (ref 0.0–1.2)
CO2: 28 mmol/L (ref 20–29)
Calcium: 9.1 mg/dL (ref 8.6–10.2)
Chloride: 101 mmol/L (ref 96–106)
Creatinine, Ser: 1.14 mg/dL (ref 0.76–1.27)
GFR calc Af Amer: 72 mL/min/{1.73_m2} (ref >59)
GFR calc non Af Amer: 63 mL/min/{1.73_m2} (ref >59)
Globulin, Total: 3.5 g/dL (ref 1.5–4.5)
Glucose: 96 mg/dL (ref 65–99)
Potassium: 4.5 mmol/L (ref 3.5–5.2)
Sodium: 140 mmol/L (ref 134–144)
Total Protein: 7 g/dL (ref 6.0–8.5)

## 2020-01-24 LAB — PSA: Prostate Specific Ag, Serum: 2.4 ng/mL (ref 0.0–4.0)

## 2020-01-24 LAB — LIPID PANEL W/O CHOL/HDL RATIO
Cholesterol, Total: 169 mg/dL (ref 100–199)
HDL: 55 mg/dL (ref >39)
LDL Chol Calc (NIH): 100 mg/dL — ABNORMAL HIGH (ref 0–99)
Triglycerides: 71 mg/dL (ref 0–149)
VLDL Cholesterol Cal: 14 mg/dL (ref 5–40)

## 2020-01-24 LAB — TSH: TSH: 1.09 u[IU]/mL (ref 0.450–4.500)

## 2020-01-28 ENCOUNTER — Other Ambulatory Visit: Payer: Self-pay | Admitting: Family Medicine

## 2020-01-28 ENCOUNTER — Telehealth: Payer: Self-pay

## 2020-01-28 LAB — UA/M W/RFLX CULTURE, ROUTINE
Bilirubin, UA: NEGATIVE
Glucose, UA: NEGATIVE
Ketones, UA: NEGATIVE
Nitrite, UA: NEGATIVE
RBC, UA: NEGATIVE
Specific Gravity, UA: 1.02 (ref 1.005–1.030)
Urobilinogen, Ur: 1 mg/dL (ref 0.2–1.0)
pH, UA: 6 (ref 5.0–7.5)

## 2020-01-28 LAB — URINE CULTURE, REFLEX

## 2020-01-28 LAB — MICROSCOPIC EXAMINATION
Bacteria, UA: NONE SEEN
RBC, Urine: NONE SEEN /hpf (ref 0–2)

## 2020-01-28 MED ORDER — NITROFURANTOIN MONOHYD MACRO 100 MG PO CAPS: 100.0000 mg | ORAL_CAPSULE | Freq: Two times a day (BID) | ORAL | 0 refills | Status: DC

## 2020-01-28 NOTE — Telephone Encounter (Signed)
Spoke with care giver-

## 2020-01-28 NOTE — Telephone Encounter (Signed)
-----   Message from [name], DO sent at 01/28/2020  1:38 PM EDT ----- Please let him and hi caregiver that he has a UTI. I've sent an antibiotic to his pharmacy.

## 2020-02-05 ENCOUNTER — Other Ambulatory Visit: Payer: Self-pay

## 2020-02-05 ENCOUNTER — Inpatient Hospital Stay: Payer: Medicare HMO | Attending: Radiation Oncology

## 2020-02-05 DIAGNOSIS — C61 Malignant neoplasm of prostate: Secondary | ICD-10-CM

## 2020-02-05 LAB — PSA: Prostatic Specific Antigen: 1.89 ng/mL (ref 0.00–4.00)

## 2020-02-12 ENCOUNTER — Other Ambulatory Visit: Payer: Self-pay

## 2020-02-12 ENCOUNTER — Ambulatory Visit
Admission: RE | Admit: 2020-02-12 | Discharge: 2020-02-12 | Disposition: A | Discharge status: Home / Self Care | Payer: Medicare HMO | Source: Ambulatory Visit | Attending: Radiation Oncology | Admitting: Radiation Oncology

## 2020-02-12 ENCOUNTER — Other Ambulatory Visit: Payer: Self-pay | Admitting: *Deleted

## 2020-02-12 ENCOUNTER — Encounter: Payer: Self-pay | Admitting: Radiation Oncology

## 2020-02-12 VITALS — BP 114/77 | HR 91 | Temp 97.5°F | Resp 16 | Wt 143.2 lb

## 2020-02-12 DIAGNOSIS — Z923 Personal history of irradiation: Secondary | ICD-10-CM | POA: Diagnosis not present

## 2020-02-12 DIAGNOSIS — C61 Malignant neoplasm of prostate: Secondary | ICD-10-CM | POA: Diagnosis not present

## 2020-02-12 NOTE — Progress Notes (Signed)
Radiation Oncology Follow up Note  Name: Ronnie Bullock   Date:   02/12/2020 MRN:  [id number] DOB: [birth year]    This 74 y.o. male presents to the clinic today for 11-month follow-up status post.  IMRT radiation therapy for Gleason 7 (3+4) adenocarcinoma prostate presenting with a PSA of 5.7.  REFERRING PROVIDER: Guadalupe Maple, MD  HPI: Patient is a 74 year old male now out 4 months having completed IMRT radiation therapy to his prostate for Gleason 7 (3+4) adenocarcinoma seen today in routine follow-up from a clinical standpoint he is doing well specifically denies any increased lower urinary tract symptoms diarrhea or fatigue.  Most recent PSA is 1.8.  COMPLICATIONS OF TREATMENT: none  FOLLOW UP COMPLIANCE: keeps appointments   PHYSICAL EXAM:  BP 114/77 (BP Location: Right Arm, Patient Position: Sitting, Cuff Size: Normal)   Pulse 91   Temp (!) 97.5 F (36.4 C)   Resp 16   Wt 143 lb 3.2 oz (65 kg)   BMI 20.78 kg/m  Well-developed well-nourished patient in NAD. HEENT reveals PERLA, EOMI, discs not visualized.  Oral cavity is clear. No oral mucosal lesions are identified. Neck is clear without evidence of cervical or supraclavicular adenopathy. Lungs are clear to A&P. Cardiac examination is essentially unremarkable with regular rate and rhythm without murmur rub or thrill. Abdomen is benign with no organomegaly or masses noted. Motor sensory and DTR levels are equal and symmetric in the upper and lower extremities. Cranial nerves II through XII are grossly intact. Proprioception is intact. No peripheral adenopathy or edema is identified. No motor or sensory levels are noted. Crude visual fields are within normal range.  RADIOLOGY RESULTS: No current films to review  PLAN: Present time patient is doing well not as significantly decrease in his PSA as I would like although it is still early on I have asked him back in 6 months for follow-up with a repeat PSA.  His clinical side effects  are minimal.  Patient knows to call sooner with any concerns.  I would like to take this opportunity to thank you for allowing me to participate in the care of your patient.Noreene Filbert, MD

## 2020-02-24 ENCOUNTER — Ambulatory Visit (INDEPENDENT_AMBULATORY_CARE_PROVIDER_SITE_OTHER): Payer: Medicare HMO | Admitting: Family Medicine

## 2020-02-24 ENCOUNTER — Encounter: Payer: Self-pay | Admitting: Family Medicine

## 2020-02-24 ENCOUNTER — Other Ambulatory Visit: Payer: Self-pay

## 2020-02-24 VITALS — BP 124/80 | HR 90 | Temp 98.3°F | Wt 146.4 lb

## 2020-02-24 DIAGNOSIS — I10 Essential (primary) hypertension: Secondary | ICD-10-CM

## 2020-02-24 MED ORDER — AMLODIPINE BESYLATE 10 MG PO TABS: 10.0000 mg | ORAL_TABLET | Freq: Every day | ORAL | 1 refills | Status: DC

## 2020-02-24 NOTE — Progress Notes (Signed)
BP 124/80 (BP Location: Left Arm, Patient Position: Sitting, Cuff Size: Normal)   Pulse 90   Temp 98.3 F (36.8 C) (Oral)   Wt 146 lb 6.4 oz (66.4 kg)   SpO2 98%   BMI 21.25 kg/m    Subjective:    Patient ID: Ronnie Bullock, male    DOB: 1946/03/01, 74 y.o.   MRN: [id number]  HPI: Ronnie Bullock is a 74 y.o. male  Chief Complaint  Patient presents with  . Hypertension   HYPERTENSION Hypertension status: controlled  Satisfied with current treatment? yes Duration of hypertension: chronic BP monitoring frequency:  not checking BP medication side effects:  no Medication compliance: excellent compliance Previous BP meds: amlodipine Aspirin: no Recurrent headaches: no Visual changes: no Palpitations: no Dyspnea: no Chest pain: no Lower extremity edema: no Dizzy/lightheaded: no  Relevant past medical, surgical, family and social history reviewed and updated as indicated. Interim medical history since our last visit reviewed. Allergies and medications reviewed and updated.  Review of Systems  Constitutional: Negative.   Respiratory: Negative.   Cardiovascular: Negative.   Gastrointestinal: Negative.   Musculoskeletal: Negative.   Psychiatric/Behavioral: Negative.     Per HPI unless specifically indicated above     Objective:    BP 124/80 (BP Location: Left Arm, Patient Position: Sitting, Cuff Size: Normal)   Pulse 90   Temp 98.3 F (36.8 C) (Oral)   Wt 146 lb 6.4 oz (66.4 kg)   SpO2 98%   BMI 21.25 kg/m   Wt Readings from Last 3 Encounters:  02/24/20 146 lb 6.4 oz (66.4 kg)  02/12/20 143 lb 3.2 oz (65 kg)  01/23/20 149 lb 12.8 oz (67.9 kg)    Physical Exam Vitals and nursing note reviewed.  Constitutional:      General: He is not in acute distress.    Appearance: Normal appearance. He is not ill-appearing, toxic-appearing or diaphoretic.  HENT:     Head: Normocephalic and atraumatic.     Right Ear: External ear normal.     Left Ear: External  ear normal.     Nose: Nose normal.     Mouth/Throat:     Mouth: Mucous membranes are moist.     Pharynx: Oropharynx is clear.  Eyes:     General: No scleral icterus.       Right eye: No discharge.        Left eye: No discharge.     Extraocular Movements: Extraocular movements intact.     Conjunctiva/sclera: Conjunctivae normal.     Pupils: Pupils are equal, round, and reactive to light.  Cardiovascular:     Rate and Rhythm: Normal rate and regular rhythm.     Pulses: Normal pulses.     Heart sounds: Normal heart sounds. No murmur. No friction rub. No gallop.   Pulmonary:     Effort: Pulmonary effort is normal. No respiratory distress.     Breath sounds: Normal breath sounds. No stridor. No wheezing, rhonchi or rales.  Chest:     Chest wall: No tenderness.  Musculoskeletal:        General: Normal range of motion.     Cervical back: Normal range of motion and neck supple.  Skin:    General: Skin is warm and dry.     Capillary Refill: Capillary refill takes less than 2 seconds.     Coloration: Skin is not jaundiced or pale.     Findings: No bruising, erythema, lesion or rash.  Neurological:  General: No focal deficit present.     Mental Status: He is alert and oriented to person, place, and time. Mental status is at baseline.  Psychiatric:        Mood and Affect: Mood normal.        Behavior: Behavior normal.        Thought Content: Thought content normal.        Judgment: Judgment normal.     Results for orders placed or performed in visit on 02/05/20  PSA  Result Value Ref Range   Prostatic Specific Antigen 1.89 0.00 - 4.00 ng/mL      Assessment & Plan:   Problem List Items Addressed This Visit      Cardiovascular and Mediastinum   Essential hypertension, benign - Primary    Under good control on current regimen. Continue current regimen. Continue to monitor. Call with any concerns. Refills given. Labs drawn today.       Relevant Medications   amLODipine  (NORVASC) 10 MG tablet   Other Relevant Orders   Basic metabolic panel       Follow up plan: Return in about 6 months (around 08/25/2020).

## 2020-02-24 NOTE — Assessment & Plan Note (Signed)
Under good control on current regimen. Continue current regimen. Continue to monitor. Call with any concerns. Refills given. Labs drawn today.   

## 2020-02-25 LAB — BASIC METABOLIC PANEL
BUN/Creatinine Ratio: 10 (ref 10–24)
BUN: 12 mg/dL (ref 8–27)
CO2: 23 mmol/L (ref 20–29)
Calcium: 9 mg/dL (ref 8.6–10.2)
Chloride: 104 mmol/L (ref 96–106)
Creatinine, Ser: 1.17 mg/dL (ref 0.76–1.27)
GFR calc Af Amer: 70 mL/min/{1.73_m2} (ref >59)
GFR calc non Af Amer: 61 mL/min/{1.73_m2} (ref >59)
Glucose: 111 mg/dL — ABNORMAL HIGH (ref 65–99)
Potassium: 3.8 mmol/L (ref 3.5–5.2)
Sodium: 141 mmol/L (ref 134–144)

## 2020-03-06 DIAGNOSIS — Z20822 Contact with and (suspected) exposure to covid-19: Secondary | ICD-10-CM | POA: Diagnosis not present

## 2020-08-05 ENCOUNTER — Other Ambulatory Visit: Payer: Self-pay

## 2020-08-05 ENCOUNTER — Inpatient Hospital Stay: Payer: Medicare HMO | Attending: Radiation Oncology

## 2020-08-05 DIAGNOSIS — C61 Malignant neoplasm of prostate: Secondary | ICD-10-CM | POA: Diagnosis not present

## 2020-08-05 LAB — PSA: Prostatic Specific Antigen: 1.03 ng/mL (ref 0.00–4.00)

## 2020-08-11 ENCOUNTER — Encounter: Payer: Self-pay | Admitting: Radiation Oncology

## 2020-08-12 ENCOUNTER — Ambulatory Visit
Admission: RE | Admit: 2020-08-12 | Discharge: 2020-08-12 | Disposition: A | Discharge status: Home / Self Care | Payer: Medicare HMO | Source: Ambulatory Visit | Attending: Radiation Oncology | Admitting: Radiation Oncology

## 2020-08-12 ENCOUNTER — Other Ambulatory Visit: Payer: Self-pay

## 2020-08-12 ENCOUNTER — Encounter: Payer: Self-pay | Admitting: Radiation Oncology

## 2020-08-12 VITALS — BP 150/102 | HR 84 | Temp 95.9°F | Wt 150.0 lb

## 2020-08-12 DIAGNOSIS — C61 Malignant neoplasm of prostate: Secondary | ICD-10-CM | POA: Diagnosis not present

## 2020-08-12 NOTE — Progress Notes (Signed)
Radiation Oncology Follow up Note  Name: Ronnie Bullock   Date:   08/12/2020 MRN:  [id number] DOB: [birth year]    This 74 y.o. male presents to the clinic today for 44-month follow-up status post IMRT radiation therapy for Gleason 7 (3+4) adenocarcinoma prostate presenting with a PSA of 5.7.  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  He his PSA is 1.3 down 6 months prior from 1.6.  COMPLICATIONS OF TREATMENT: none  FOLLOW UP COMPLIANCE: keeps appointments   PHYSICAL EXAM:  BP (!) 150/102   Pulse 84   Temp (!) 95.9 F (35.5 C) (Tympanic)   Wt 150 lb (68 kg)   BMI 21.77 kg/m  Well-developed well-nourished patient in NAD. HEENT reveals PERLA, EOMI, discs not visualized.  Oral cavity is clear. No oral mucosal lesions are identified. Neck is clear without evidence of cervical or supraclavicular adenopathy. Lungs are clear to A&P. Cardiac examination is essentially unremarkable with regular rate and rhythm without murmur rub or thrill. Abdomen is benign with no organomegaly or masses noted. Motor sensory and DTR levels are equal and symmetric in the upper and lower extremities. Cranial nerves II through XII are grossly intact. Proprioception is intact. No peripheral adenopathy or edema is identified. No motor or sensory levels are noted. Crude visual fields are within normal range.  RADIOLOGY RESULTS: Patient is a no current films to review  PLAN: Present time patient is under excellent biochemical control of his prostate cancer and pleased with his overall progress.  I have asked to see him back in 1 year for follow-up with a PSA at that time.  Patient knows to call with any concerns.  I  would like to take this opportunity to thank you for allowing me to participate in the care of your patient.Noreene Filbert, MD

## 2020-08-25 ENCOUNTER — Ambulatory Visit: Payer: Medicare HMO | Admitting: Family Medicine

## 2020-09-10 ENCOUNTER — Encounter: Payer: Self-pay | Admitting: Family Medicine

## 2020-09-10 ENCOUNTER — Ambulatory Visit (INDEPENDENT_AMBULATORY_CARE_PROVIDER_SITE_OTHER): Payer: Medicare HMO | Admitting: Family Medicine

## 2020-09-10 ENCOUNTER — Other Ambulatory Visit: Payer: Self-pay

## 2020-09-10 VITALS — BP 136/72 | HR 90 | Temp 98.0°F | Ht 69.0 in | Wt 148.0 lb

## 2020-09-10 DIAGNOSIS — E7800 Pure hypercholesterolemia, unspecified: Secondary | ICD-10-CM

## 2020-09-10 DIAGNOSIS — I10 Essential (primary) hypertension: Secondary | ICD-10-CM | POA: Diagnosis not present

## 2020-09-10 MED ORDER — AMLODIPINE BESYLATE 10 MG PO TABS
10.0000 mg | ORAL_TABLET | Freq: Every day | ORAL | 1 refills | Status: DC
Start: 2020-09-10 — End: 2020-11-03

## 2020-09-10 NOTE — Assessment & Plan Note (Signed)
Under good control on current regimen. Continue current regimen. Continue to monitor. Call with any concerns. Refills given. Labs drawn today.   

## 2020-09-10 NOTE — Assessment & Plan Note (Signed)
Rechecking labs today. Await results. Treat as needed.  °

## 2020-09-10 NOTE — Progress Notes (Signed)
BP 136/72 (BP Location: Left Arm, Cuff Size: Normal)   Pulse 90   Temp 98 F (36.7 C) (Oral)   Ht 5\' 9"  (1.753 m)   Wt 148 lb (67.1 kg)   SpO2 94%   BMI 21.86 kg/m    Subjective:    Patient ID: Ronnie Bullock, male    DOB: June 11, 1946, 74 y.o.   MRN: [id number]  HPI: Ronnie Bullock is a 74 y.o. male  Chief Complaint  Patient presents with  . Hypertension   HYPERTENSION / [place] Satisfied with current treatment? yes Duration of hypertension: chronic BP monitoring frequency: not checking BP medication side effects: no Past BP meds: amlodipne Duration of hyperlipidemia: chronic Cholesterol medication side effects: not on anything Cholesterol supplements: none Past cholesterol medications: none Medication compliance: excellent compliance Aspirin: no Recent stressors: no Recurrent headaches: no Visual changes: no Palpitations: no Dyspnea: no Chest pain: no Lower extremity edema: no Dizzy/lightheaded: no   Relevant past medical, surgical, family and social history reviewed and updated as indicated. Interim medical history since our last visit reviewed. Allergies and medications reviewed and updated.  Review of Systems  Constitutional: Negative.   Respiratory: Negative.   Cardiovascular: Negative.   Gastrointestinal: Negative.   Musculoskeletal: Negative.   Neurological: Negative.   Psychiatric/Behavioral: Negative.     Per HPI unless specifically indicated above     Objective:    BP 136/72 (BP Location: Left Arm, Cuff Size: Normal)   Pulse 90   Temp 98 F (36.7 C) (Oral)   Ht 5\' 9"  (1.753 m)   Wt 148 lb (67.1 kg)   SpO2 94%   BMI 21.86 kg/m   Wt Readings from Last 3 Encounters:  09/10/20 148 lb (67.1 kg)  08/12/20 150 lb (68 kg)  02/24/20 146 lb 6.4 oz (66.4 kg)    Physical Exam Vitals and nursing note reviewed.  Constitutional:      General: He is not in acute distress.    Appearance: Normal appearance. He is not ill-appearing,  toxic-appearing or diaphoretic.  HENT:     Head: Normocephalic and atraumatic.     Right Ear: External ear normal.     Left Ear: External ear normal.     Nose: Nose normal.     Mouth/Throat:     Mouth: Mucous membranes are moist.     Pharynx: Oropharynx is clear.  Eyes:     General: No scleral icterus.       Right eye: No discharge.        Left eye: No discharge.     Extraocular Movements: Extraocular movements intact.     Conjunctiva/sclera: Conjunctivae normal.     Pupils: Pupils are equal, round, and reactive to light.  Cardiovascular:     Rate and Rhythm: Normal rate and regular rhythm.     Pulses: Normal pulses.     Heart sounds: Normal heart sounds. No murmur heard.  No friction rub. No gallop.   Pulmonary:     Effort: Pulmonary effort is normal. No respiratory distress.     Breath sounds: Normal breath sounds. No stridor. No wheezing, rhonchi or rales.  Chest:     Chest wall: No tenderness.  Musculoskeletal:        General: Normal range of motion.     Cervical back: Normal range of motion and neck supple.  Skin:    General: Skin is warm and dry.     Capillary Refill: Capillary refill takes less than 2 seconds.  Coloration: Skin is not jaundiced or pale.     Findings: No bruising, erythema, lesion or rash.  Neurological:     General: No focal deficit present.     Mental Status: He is alert and oriented to person, place, and time. Mental status is at baseline.  Psychiatric:        Mood and Affect: Mood normal.        Behavior: Behavior normal.        Thought Content: Thought content normal.        Judgment: Judgment normal.     Results for orders placed or performed in visit on 08/05/20  PSA  Result Value Ref Range   Prostatic Specific Antigen 1.03 0.00 - 4.00 ng/mL      Assessment & Plan:   Problem List Items Addressed This Visit      Cardiovascular and Mediastinum   Essential hypertension, benign - Primary    Under good control on current regimen.  Continue current regimen. Continue to monitor. Call with any concerns. Refills given. Labs drawn today.       Relevant Medications   amLODipine (NORVASC) 10 MG tablet   Other Relevant Orders   CBC with Differential/Platelet   Comprehensive metabolic panel     Other   Hypercholesteremia    Rechecking labs today. Await results. Treat as needed.       Relevant Medications   amLODipine (NORVASC) 10 MG tablet   Other Relevant Orders   Comprehensive metabolic panel   Lipid Panel w/o Chol/HDL Ratio       Follow up plan: Return in about 6 months (around 03/10/2021) for physical.

## 2020-09-11 LAB — CBC WITH DIFFERENTIAL/PLATELET
Basophils Absolute: 0 10*3/uL (ref 0.0–0.2)
Basos: 1 %
EOS (ABSOLUTE): 0.3 10*3/uL (ref 0.0–0.4)
Eos: 8 %
Hematocrit: 36.7 % — ABNORMAL LOW (ref 37.5–51.0)
Hemoglobin: 13 g/dL (ref 13.0–17.7)
Immature Grans (Abs): 0 10*3/uL (ref 0.0–0.1)
Immature Granulocytes: 0 %
Lymphocytes Absolute: 0.8 10*3/uL (ref 0.7–3.1)
Lymphs: 18 %
MCH: 31.2 pg (ref 26.6–33.0)
MCHC: 35.4 g/dL (ref 31.5–35.7)
MCV: 88 fL (ref 79–97)
Monocytes Absolute: 0.5 10*3/uL (ref 0.1–0.9)
Monocytes: 11 %
Neutrophils Absolute: 2.8 10*3/uL (ref 1.4–7.0)
Neutrophils: 62 %
Platelets: 240 10*3/uL (ref 150–450)
RBC: 4.17 x10E6/uL (ref 4.14–5.80)
RDW: 13 % (ref 11.6–15.4)
WBC: 4.5 10*3/uL (ref 3.4–10.8)

## 2020-09-11 LAB — COMPREHENSIVE METABOLIC PANEL
ALT: 10 IU/L (ref 0–44)
AST: 24 IU/L (ref 0–40)
Albumin/Globulin Ratio: 0.9 — ABNORMAL LOW (ref 1.2–2.2)
Albumin: 3.4 g/dL — ABNORMAL LOW (ref 3.7–4.7)
Alkaline Phosphatase: 86 IU/L (ref 44–121)
BUN/Creatinine Ratio: 16 (ref 10–24)
BUN: 16 mg/dL (ref 8–27)
Bilirubin Total: 0.6 mg/dL (ref 0.0–1.2)
CO2: 23 mmol/L (ref 20–29)
Calcium: 8.8 mg/dL (ref 8.6–10.2)
Chloride: 105 mmol/L (ref 96–106)
Creatinine, Ser: 1.01 mg/dL (ref 0.76–1.27)
GFR calc Af Amer: 84 mL/min/{1.73_m2} (ref >59)
GFR calc non Af Amer: 72 mL/min/{1.73_m2} (ref >59)
Globulin, Total: 3.6 g/dL (ref 1.5–4.5)
Glucose: 93 mg/dL (ref 65–99)
Potassium: 4.1 mmol/L (ref 3.5–5.2)
Sodium: 139 mmol/L (ref 134–144)
Total Protein: 7 g/dL (ref 6.0–8.5)

## 2020-09-11 LAB — LIPID PANEL W/O CHOL/HDL RATIO
Cholesterol, Total: 165 mg/dL (ref 100–199)
HDL: 65 mg/dL (ref >39)
LDL Chol Calc (NIH): 91 mg/dL (ref 0–99)
Triglycerides: 41 mg/dL (ref 0–149)
VLDL Cholesterol Cal: 9 mg/dL (ref 5–40)

## 2020-09-23 ENCOUNTER — Encounter: Payer: Self-pay | Admitting: Family Medicine

## 2020-09-23 ENCOUNTER — Other Ambulatory Visit: Payer: Self-pay

## 2020-09-23 ENCOUNTER — Ambulatory Visit (INDEPENDENT_AMBULATORY_CARE_PROVIDER_SITE_OTHER): Payer: Medicare HMO | Admitting: Family Medicine

## 2020-09-23 VITALS — BP 115/74 | HR 87 | Temp 98.4°F | Wt 154.2 lb

## 2020-09-23 DIAGNOSIS — R195 Other fecal abnormalities: Secondary | ICD-10-CM | POA: Diagnosis not present

## 2020-09-23 MED ORDER — POLYETHYLENE GLYCOL 3350 17 GM/SCOOP PO POWD: 17.0000 g | Freq: Every day | ORAL | 0 refills | Status: DC

## 2020-09-23 NOTE — Progress Notes (Signed)
   SUBJECTIVE:   CHIEF COMPLAINT / HPI:   DARK STOOLS Duration: intermittent for years, worse the last week. Anal fullness: yes Perianal itching/irritation: no Perianal pain: no  Bright red rectal bleeding: no  Nausea: no Vomiting: no Frequency: intermittent Constipation: yes.  Hard stools: yes, alternating with loose stools, type 5 on [place]ol stool chart. Normally has BM every 2 days. Chronic straining/valsava: yes Alleviating factors: none Aggravating factors: none Status: worse Treatments attempted: nothing  Previous hemorrhoids: no  Colonoscopy: UTD, due 2024  Doesn't drink much water. Drinks 2 soda/tea drinks per day. Doesn't have much vegetable/fruit intake. Does not take pepto bismol. Denies chest pain, SOB, abdominal pain.  PERTINENT  PMH / PSH: HTN, prostate cancer, HLD  OBJECTIVE:   BP 115/74   Pulse 87   Temp 98.4 F (36.9 C) (Oral)   Wt 154 lb 3.2 oz (69.9 kg)   SpO2 97%   BMI 22.77 kg/m   Gen: well appearing, in NAD Rectal exam: negative without mass, lesions or tenderness, sphincter tone normal. No hemorrhoids, bleeding, fissures seen on anoscopic exam.   ASSESSMENT/PLAN:   Dark stools Intermittent with worsening the last week. No abnormalities noted on anoscopic exam today, obtained FOBT today, awaiting results. Vitals wnl and no symptoms to suggest symptomatic anemia, CBC obtained today. With h/o precancerous polyps on recent colonoscopy, will refer back to GI for further evaluation. Recommend bowel regimen with plenty of water and fiber intake in the meantime.     Myles Gip, DO

## 2020-09-23 NOTE — Assessment & Plan Note (Signed)
Intermittent with worsening the last week. No abnormalities noted on anoscopic exam today, obtained FOBT today, awaiting results. Vitals wnl and no symptoms to suggest symptomatic anemia, CBC obtained today. With h/o precancerous polyps on recent colonoscopy, will refer back to GI for further evaluation. Recommend bowel regimen with plenty of water and fiber intake in the meantime.

## 2020-09-23 NOTE — Patient Instructions (Addendum)
It was great to see you!  Our plans for today:  - Drink plenty of water and eating vegetables and fruit will help with constipation and straining with bowel movements. - Try miralax to help with constipation. Mix one scoop with a full glass of water every day. - We are sending you back to the GI doctor.  We are checking some labs today, we will release your results to MyChart.   Take care and seek immediate care sooner if you develop any concerns.   Dr. Ky Barban  Here is an example of what a healthy plate looks like:    ? Make half your plate fruits and vegetables.     ? Focus on whole fruits.     ? Vary your veggies.  ? Make half your grains whole grains. -     ? Look for the word "whole" at the beginning of the ingredients list    ? Some whole-grain ingredients include whole oats, whole-wheat flour,        whole-grain corn, whole-grain brown rice, and whole rye.  ? Move to low-fat and fat-free milk or yogurt.  ? Vary your protein routine. - Meat, fish, poultry (chicken, [place]), eggs, beans (kidney, pinto), dairy.  ? Drink and eat less sodium, saturated fat, and added sugars.

## 2020-09-24 ENCOUNTER — Ambulatory Visit: Payer: Medicare HMO | Admitting: Family Medicine

## 2020-09-24 LAB — CBC
Hematocrit: 35.9 % — ABNORMAL LOW (ref 37.5–51.0)
Hemoglobin: 12.5 g/dL — ABNORMAL LOW (ref 13.0–17.7)
MCH: 30.9 pg (ref 26.6–33.0)
MCHC: 34.8 g/dL (ref 31.5–35.7)
MCV: 89 fL (ref 79–97)
Platelets: 224 10*3/uL (ref 150–450)
RBC: 4.04 x10E6/uL — ABNORMAL LOW (ref 4.14–5.80)
RDW: 13 % (ref 11.6–15.4)
WBC: 4.7 10*3/uL (ref 3.4–10.8)

## 2020-09-24 LAB — FECAL OCCULT BLOOD, IMMUNOCHEMICAL: Fecal Occult Bld: POSITIVE — AB

## 2020-10-05 ENCOUNTER — Telehealth: Payer: Self-pay

## 2020-10-05 NOTE — Telephone Encounter (Signed)
Please advise pt last seen 11/17  Copied from [place] #[id number]. Topic: General - Other >> Oct 05, 2020  1:57 PM Yvette Rack wrote: Reason for CRM: Leanor Kail called in with the patient along side him and stated patient is already having loose stool so he does not think patient needs Miralax. Cb# [phone]

## 2020-10-06 NOTE — Telephone Encounter (Signed)
Spoke to caregiver, Leanor Kail, regarding patient. Patient is not having any hard stools as previously discussed at last visit, only loose stools and has only had the miralax a few times since visit. Ok to discontinue miralax.

## 2020-10-30 ENCOUNTER — Ambulatory Visit
Admission: EM | Admit: 2020-10-30 | Discharge: 2020-10-30 | Disposition: A | Discharge status: Home / Self Care | Payer: Medicare HMO | Attending: Emergency Medicine | Admitting: Emergency Medicine

## 2020-10-30 ENCOUNTER — Encounter: Payer: Self-pay | Admitting: Emergency Medicine

## 2020-10-30 ENCOUNTER — Other Ambulatory Visit: Payer: Self-pay

## 2020-10-30 DIAGNOSIS — E785 Hyperlipidemia, unspecified: Secondary | ICD-10-CM | POA: Diagnosis not present

## 2020-10-30 DIAGNOSIS — R300 Dysuria: Secondary | ICD-10-CM

## 2020-10-30 DIAGNOSIS — I10 Essential (primary) hypertension: Secondary | ICD-10-CM | POA: Diagnosis not present

## 2020-10-30 DIAGNOSIS — N433 Hydrocele, unspecified: Secondary | ICD-10-CM | POA: Diagnosis not present

## 2020-10-30 DIAGNOSIS — Z8546 Personal history of malignant neoplasm of prostate: Secondary | ICD-10-CM | POA: Diagnosis not present

## 2020-10-30 DIAGNOSIS — N5089 Other specified disorders of the male genital organs: Secondary | ICD-10-CM | POA: Diagnosis not present

## 2020-10-30 DIAGNOSIS — R42 Dizziness and giddiness: Secondary | ICD-10-CM

## 2020-10-30 LAB — POCT URINALYSIS DIP (MANUAL ENTRY)
Bilirubin, UA: NEGATIVE
Blood, UA: NEGATIVE
Glucose, UA: NEGATIVE mg/dL
Ketones, POC UA: NEGATIVE mg/dL
Leukocytes, UA: NEGATIVE
Nitrite, UA: NEGATIVE
Protein Ur, POC: 100 mg/dL — AB
Spec Grav, UA: 1.025 (ref 1.010–1.025)
Urobilinogen, UA: 0.2 E.U./dL
pH, UA: 6.5 (ref 5.0–8.0)

## 2020-10-30 NOTE — ED Provider Notes (Signed)
[facility]    CSN: [id number] Arrival date & time: 10/30/20  1120      History   Chief Complaint Chief Complaint  Patient presents with   Dehydration   Dizziness    HPI Ronnie Bullock is a 74 y.o. male.   Ronnie Bullock presents with his sister with complaints of dizziness, scrotal swelling, and pain to the penis. His sister also verbalizes concerns about patient's wellbeing as it sounds like he has someone who is serving as guardian over person and money's, which she was unaware of until now. She feels concerned about the quality of care this person is providing her brother. Patient and sister denies any changes to mentation, however in listening to patient his speech is difficult to execute and he exhibits limited capability of verbalization of concerns, with his sister having to provide most of history. No previous CVA history. No known falls or head injury. Per chart review history of hydrocele, htn, prostate cancer.    ROS per HPI, negative if not otherwise mentioned.      Past Medical History:  Diagnosis Date   Hyperlipidemia    Hypertension     Patient Active Problem List   Diagnosis Date Noted   Dark stools 09/23/2020   Prostate cancer ([place]) 07/03/2019   Elevated PSA 03/28/2019   Encysted hydrocele 03/28/2019   Benign neoplasm of ascending colon    Benign neoplasm of cecum    Hypercholesteremia 05/02/2018   Advanced care planning/counseling discussion 11/10/2017   Essential hypertension, benign 10/10/2017    Past Surgical History:  Procedure Laterality Date   COLONOSCOPY WITH PROPOFOL N/A 05/16/2018   Procedure: COLONOSCOPY WITH PROPOFOL;  Surgeon: Virgel Manifold, MD;  Location: [facility] ENDOSCOPY;  Service: Endoscopy;  Laterality: N/A;       Home Medications    Prior to Admission medications   Medication Sig Start Date End Date Taking? Authorizing Provider  amLODipine (NORVASC) 10 MG tablet Take 1 tablet (10 mg  total) by mouth daily. 09/10/20   Johnson, Megan P, DO  polyethylene glycol powder (GLYCOLAX/MIRALAX) 17 GM/SCOOP powder Take 17 g by mouth daily. 09/23/20   Myles Gip, DO    Family History Family History  Problem Relation Age of Onset   Hypertension Mother    Prostate cancer Neg Hx    Bladder Cancer Neg Hx    Kidney cancer Neg Hx     Social History Social History   Tobacco Use   Smoking status: Never Smoker   Smokeless tobacco: Never Used  Vaping Use   Vaping Use: Never used  Substance Use Topics   Alcohol use: No    Alcohol/week: 0.0 standard drinks   Drug use: No     Allergies   Patient has no known allergies.   Review of Systems Review of Systems   Physical Exam Triage Vital Signs ED Triage Vitals  Enc Vitals Group     BP 10/30/20 1342 (!) 155/92     Pulse Rate 10/30/20 1342 83     Resp 10/30/20 1342 16     Temp 10/30/20 1342 97.8 F (36.6 C)     Temp Source 10/30/20 1342 Oral     SpO2 10/30/20 1342 93 %     Weight 10/30/20 1336 145 lb (65.8 kg)     Height 10/30/20 1336 5\' 6"  (1.676 m)     Head Circumference --      Peak Flow --      Pain Score 10/30/20  1336 0     Pain Loc --      Pain Edu? --      Excl. in [place]? --    No data found.  Updated Vital Signs BP (!) 155/92 (BP Location: Left Arm)    Pulse 83    Temp 97.8 F (36.6 C) (Oral)    Resp 16    Ht 5\' 6"  (1.676 m)    Wt 145 lb (65.8 kg)    SpO2 93%    BMI 23.40 kg/m   Visual Acuity Right Eye Distance:   Left Eye Distance:   Bilateral Distance:    Right Eye Near:   Left Eye Near:    Bilateral Near:     Physical Exam Constitutional:      Appearance: He is well-developed.  Cardiovascular:     Rate and Rhythm: Normal rate.  Pulmonary:     Effort: Pulmonary effort is normal.  Skin:    General: Skin is warm and dry.  Neurological:     Mental Status: He is alert.     Comments: Sister voices this patient at baseline, however, difficulty with speech is witnessed by this  provider as well as by nursing staff; slightly unsteady gait     EKG:  NSR rate of 73 .  No stwave changes as interpreted by me.    UC Treatments / Results  Labs (all labs ordered are listed, but only abnormal results are displayed) Labs Reviewed  POCT URINALYSIS DIP (MANUAL ENTRY) - Abnormal; Notable for the following components:      Result Value   Protein Ur, POC =100 (*)    All other components within normal limits    EKG   Radiology No results found.  Procedures Procedures (including critical care time)  Medications Ordered in UC Medications - No data to display  Initial Impression / Assessment and Plan / UC Course  I have reviewed the triage vital signs and the nursing notes.  Pertinent labs & imaging results that were available during Ronnie care of the patient were reviewed by me and considered in Ronnie medical decision making (see chart for details).     It does seem as though a more thorough evaluation, particularly even with simple laboratory evaluation such as cmp is warranted as unable to determine if this is truly patient's baseline. There is also concern about his guardianship (?) with what sounds like concern about potential neglect of care, per his daughter who is present with him now. Sister is agreeable to this and will transport him to ER now.  Final Clinical Impressions(s) / UC Diagnoses   Final diagnoses:  Dysuria  Dizziness     Discharge Instructions     I do feel you warrant additional evaluation in the ER now.     ED Prescriptions    None     PDMP not reviewed this encounter.   Zigmund Gottron, NP 10/30/20 1501

## 2020-10-30 NOTE — Discharge Instructions (Signed)
I do feel you warrant additional evaluation in the ER now.

## 2020-10-30 NOTE — ED Triage Notes (Signed)
Patient in office with sister(Dawn) c/o Dizziness x66yr and dehydrated x66yr.

## 2020-11-02 ENCOUNTER — Ambulatory Visit: Payer: Self-pay

## 2020-11-02 NOTE — Telephone Encounter (Signed)
Pt.'s sister reports pt. Was seen in ED 10/30/20 with dizziness and scrotal swelling. Concerned about swelling. Warm transfer to [place] in the practice for hospital follow up,  Reason for Disposition  [1] MILD dizziness (e.g., walking normally) AND [2] has NOT been evaluated by physician for this  (Exception: dizziness caused by heat exposure, sudden standing, or poor fluid intake)  Answer Assessment - Initial Assessment Questions 1. DESCRIPTION: "Describe your dizziness."     Dizzy 2. LIGHTHEADED: "Do you feel lightheaded?" (e.g., somewhat faint, woozy, weak upon standing)     Yes 3. VERTIGO: "Do you feel like either you or the room is spinning or tilting?" (i.e. vertigo)     No 4. SEVERITY: "How bad is it?"  "Do you feel like you are going to faint?" "Can you stand and walk?"   - MILD: Feels slightly dizzy, but walking normally.   - MODERATE: Feels very unsteady when walking, but not falling; interferes with normal activities (e.g., school, work) .   - SEVERE: Unable to walk without falling, or requires assistance to walk without falling; feels like passing out now.      Mild 5. ONSET:  "When did the dizziness begin?"     10/30/20 6. AGGRAVATING FACTORS: "Does anything make it worse?" (e.g., standing, change in head position)     Movement 7. HEART RATE: "Can you tell me your heart rate?" "How many beats in 15 seconds?"  (Note: not all patients can do this)       No 8. CAUSE: "What do you think is causing the dizziness?"     Unsure 9. RECURRENT SYMPTOM: "Have you had dizziness before?" If Yes, ask: "When was the last time?" "What happened that time?"     No 10. OTHER SYMPTOMS: "Do you have any other symptoms?" (e.g., fever, chest pain, vomiting, diarrhea, bleeding)       No 11. PREGNANCY: "Is there any chance you are pregnant?" "When was your last menstrual period?"       n/a  Protocols used: DIZZINESS [facility]

## 2020-11-03 ENCOUNTER — Other Ambulatory Visit: Payer: Self-pay

## 2020-11-03 ENCOUNTER — Ambulatory Visit (INDEPENDENT_AMBULATORY_CARE_PROVIDER_SITE_OTHER): Payer: Medicare HMO | Admitting: Family Medicine

## 2020-11-03 ENCOUNTER — Encounter: Payer: Self-pay | Admitting: Family Medicine

## 2020-11-03 VITALS — BP 134/83 | HR 87 | Temp 98.1°F | Wt 153.2 lb

## 2020-11-03 DIAGNOSIS — R42 Dizziness and giddiness: Secondary | ICD-10-CM | POA: Insufficient documentation

## 2020-11-03 DIAGNOSIS — N5089 Other specified disorders of the male genital organs: Secondary | ICD-10-CM | POA: Insufficient documentation

## 2020-11-03 LAB — URINALYSIS, ROUTINE W REFLEX MICROSCOPIC
Bilirubin, UA: NEGATIVE
Glucose, UA: NEGATIVE
Ketones, UA: NEGATIVE
Leukocytes,UA: NEGATIVE
Nitrite, UA: NEGATIVE
RBC, UA: NEGATIVE
Specific Gravity, UA: 1.025 (ref 1.005–1.030)
Urobilinogen, Ur: 1 mg/dL (ref 0.2–1.0)
pH, UA: 7 (ref 5.0–7.5)

## 2020-11-03 LAB — MICROSCOPIC EXAMINATION
Bacteria, UA: NONE SEEN
RBC, Urine: NONE SEEN /hpf (ref 0–2)
WBC, UA: NONE SEEN /hpf (ref 0–5)

## 2020-11-03 MED ORDER — CEPHALEXIN 500 MG PO CAPS: 500.0000 mg | ORAL_CAPSULE | Freq: Four times a day (QID) | ORAL | 0 refills | Status: AC

## 2020-11-03 MED ORDER — AMLODIPINE BESYLATE 5 MG PO TABS: 5.0000 mg | ORAL_TABLET | Freq: Every day | ORAL | 0 refills | Status: DC

## 2020-11-03 NOTE — Progress Notes (Signed)
    SUBJECTIVE:   CHIEF COMPLAINT / HPI:   Patient Active Problem List   Diagnosis Date Noted  . Scrotal swelling 11/03/2020  . Dizziness 11/03/2020  . Dark stools 09/23/2020  . Prostate cancer (HCC) 07/03/2019  . Elevated PSA 03/28/2019  . 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 Benign neoplasm of ascending colon   . Benign neoplasm of cecum   . Hypercholesteremia 05/02/2018  . Advanced care planning/counseling discussion 11/10/2017  . Essential hypertension, benign 10/10/2017    ER FOLLOW UP Time since discharge: 4 days Hospital/facility: [facility] ED Diagnosis: scrotal swelling, dizziness Procedures/tests:  - Scrotum US - b/l hydroceles with diffuse skin thickening - UA negative for infection - EKG NSR Consultants: none New medications: none Discharge instructions:   - f/u with Urology, PCP Status: worse   Scrotal Swelling - 6 months duration, worse - scrotum itches - denies pain, rashes, penile discharge, dysuria, hematuria - endorses feeling an abnormal lump at top of penis. - not sexually active.  - no penile discharge.  - last saw urology 10/2019.  - lives alone. Has aide that helps out. - some concern for caregiver quality of care, sister will be taking over as caregiver and HCPOA.   DIZZINESS - notices when get up in am and with standing. 6 months duration.  Duration: 6 months Description of symptoms: ill-defined Duration of episode: seconds Dizziness frequency: recurrent Provoking factors: standing Triggered by rolling over in bed: no Triggered by bending over: no Aggravated by head movement: no Aggravated by exertion, coughing, loud noises: no Recent head injury: no Recent or current viral symptoms: no History of vasovagal episodes: no Nausea: no Vomiting: no Tinnitus: no Hearing loss: no Aural fullness: no Headache: no Photophobia/phonophobia: no Unsteady gait: no Postural instability: no Diplopia, dysarthria, dysphagia or weakness:  no Related to exertion: no Pallor: no Diaphoresis: no Dyspnea: no Chest pain: no  No falls.  OBJECTIVE:   BP 134/83   Pulse 87   Temp 98.1 F (36.7 C) (Oral)   Wt 153 lb 3.2 oz (69.5 kg)   SpO2 97%   BMI 24.73 kg/m   Gen: well appearing, in NAD Card: RRR, no murmur Lungs: CTAB Ext: WWP, no edema Genital: diffuse scrotal swelling with few breaks in skin and minimal surrounding erythema. No blisters or chancres appreciated. No discharge from penis. Able to retract foreskin fully. No lymphadenopathy appreciated. R sided inguinal hernia, reducible. MSK: Full ROM, strength 5/5 to U/LE bilaterally, deep reflexes intact, normal gait.  No edema.  Neuro: Alert and oriented, speech normal.  Optic field normal. Extraocular movements intact.  Intact symmetric sensation to light touch of face and extremities bilaterally.  Hearing grossly intact bilaterally.  Tongue protrudes normally with no deviation.  Shoulder shrug, smile symmetric.   ASSESSMENT/PLAN:   Scrotal swelling Exam and [place] consistent with possible cellulitis, will Rx keflex x5 days. Scrotal US reviewed and without masses, known b/l hydroceles. F/u in one week for recheck.   Dizziness Positional with +orthostatics today. Neuro exam wnl. No red flags on history or exam.  Will decrease amlodipine. F/u in one week.    Ronnie Laroche, DO

## 2020-11-03 NOTE — Assessment & Plan Note (Signed)
Exam and [place] consistent with possible cellulitis, will Rx keflex x5 days. Scrotal US reviewed and without masses, known b/l hydroceles. F/u in one week for recheck.

## 2020-11-03 NOTE — Patient Instructions (Addendum)
It was great to see you!  Our plans for today:  - We are testing your urine for urine infection.  - Take the antibiotic for skin infection for 5 days. - We are decreasing your blood pressure medication. I sent in a new prescription to the pharmacy. - Make sure to take your time when changing positions.  - Drink plenty of water. Aim for eight 8 oz glasses of water each day. Avoid caffeinated drinks like teas and sodas.  We are checking some labs today, we will release these results to your MyChart.  Take care and seek immediate care sooner if you develop any concerns.   Dr. Linwood Dibbles

## 2020-11-03 NOTE — Assessment & Plan Note (Addendum)
Positional with +orthostatics today. Neuro exam wnl. No red flags on history or exam.  Will decrease amlodipine. F/u in one week.

## 2020-11-05 LAB — CHLAMYDIA/GONOCOCCUS/TRICHOMONAS, NAA
Chlamydia by NAA: NEGATIVE
Gonococcus by NAA: NEGATIVE
Trich vag by NAA: NEGATIVE

## 2020-11-10 ENCOUNTER — Encounter: Payer: Self-pay | Admitting: Family Medicine

## 2020-11-10 ENCOUNTER — Ambulatory Visit (INDEPENDENT_AMBULATORY_CARE_PROVIDER_SITE_OTHER): Payer: Medicare HMO | Admitting: Family Medicine

## 2020-11-10 ENCOUNTER — Other Ambulatory Visit: Payer: Self-pay

## 2020-11-10 VITALS — BP 134/82 | HR 94 | Temp 98.4°F | Wt 148.8 lb

## 2020-11-10 DIAGNOSIS — R42 Dizziness and giddiness: Secondary | ICD-10-CM

## 2020-11-10 DIAGNOSIS — N5089 Other specified disorders of the male genital organs: Secondary | ICD-10-CM

## 2020-11-10 DIAGNOSIS — I10 Essential (primary) hypertension: Secondary | ICD-10-CM

## 2020-11-10 DIAGNOSIS — Z23 Encounter for immunization: Secondary | ICD-10-CM | POA: Diagnosis not present

## 2020-11-10 NOTE — Assessment & Plan Note (Signed)
Resolved

## 2020-11-10 NOTE — Assessment & Plan Note (Signed)
At goal without orthostatic sx. Dizziness resolved. Continue decreased dose of amlodipine. Follow up as scheduled with PCP in 4 months.

## 2020-11-10 NOTE — Progress Notes (Signed)
   SUBJECTIVE:   CHIEF COMPLAINT / HPI:   Past Medical History:  Diagnosis Date  . Hyperlipidemia   . Hypertension   . Incontinence    Scrotal Swelling - seen previously 12/28 for progressive diffuse scrotal swelling and itching x6 months. Thought to be 2/2 cellulitis given break in skin, redness. Rx keflex x5 days. - scrotal US with b/l hydroceles and diffuse skin thickening. - GC/CT/trich negative - not sexually active. - swelling better. No itching or pain.   Hypertension:0 - Medications: amlodipine 5mg  (decreased at last visit) - Compliance: good - Checking BP at home: yes, doesn't know measurements. - Denies any SOB, CP, vision changes, LE edema, medication SEs, or symptoms of hypotension    OBJECTIVE:   BP 134/82   Pulse 94   Temp 98.4 F (36.9 C)   Wt 148 lb 12.8 oz (67.5 kg)   SpO2 94%   BMI 24.02 kg/m   Gen: well appearing, in NAD Card: RRR Lungs: CTAB Ext: no edema, WWP Genital: uncircumcised, able to fully retract foreskin. No redness, blisters, lesions. Swelling improved from prior.  ASSESSMENT/PLAN:   Essential hypertension, benign At goal without orthostatic sx. Dizziness resolved. Continue decreased dose of amlodipine. Follow up as scheduled with PCP in 4 months.  Dizziness Resolved.   Scrotal swelling Improved s/p abx treatment, likely 2/2 cellulitis. Still with scrotal fullness, likely 2/2 known hydroceles. If becomes bothersome, can consider rereferral to urology for further assessment.     , DO

## 2020-11-10 NOTE — Patient Instructions (Signed)
It was great to see you!  Our plans for today:  - No change to your blood pressure medication. - Your swelling has improved though you still have some fullness to your scrotum. If this is bothersome to you, we can send you back to Urology for further evaluation but it appears your infection has cleared.  - We'll see you back in May for your regular follow up.   Take care and seek immediate care sooner if you develop any concerns.   Dr. Linwood Dibbles

## 2020-11-10 NOTE — Assessment & Plan Note (Signed)
Improved s/p abx treatment, likely 2/2 cellulitis. Still with scrotal fullness, likely 2/2 known hydroceles. If becomes bothersome, can consider rereferral to urology for further assessment.

## 2020-11-12 ENCOUNTER — Ambulatory Visit (INDEPENDENT_AMBULATORY_CARE_PROVIDER_SITE_OTHER): Payer: Medicare HMO | Admitting: Physician Assistant

## 2020-11-12 ENCOUNTER — Encounter: Payer: Self-pay | Admitting: Physician Assistant

## 2020-11-12 ENCOUNTER — Other Ambulatory Visit: Payer: Self-pay

## 2020-11-12 VITALS — BP 140/70 | HR 72 | Ht 67.0 in | Wt 151.0 lb

## 2020-11-12 DIAGNOSIS — R300 Dysuria: Secondary | ICD-10-CM | POA: Diagnosis not present

## 2020-11-12 DIAGNOSIS — R339 Retention of urine, unspecified: Secondary | ICD-10-CM

## 2020-11-12 DIAGNOSIS — C61 Malignant neoplasm of prostate: Secondary | ICD-10-CM | POA: Diagnosis not present

## 2020-11-12 DIAGNOSIS — R351 Nocturia: Secondary | ICD-10-CM | POA: Diagnosis not present

## 2020-11-12 LAB — BLADDER SCAN AMB NON-IMAGING

## 2020-11-12 MED ORDER — TAMSULOSIN HCL 0.4 MG PO CAPS
0.4000 mg | ORAL_CAPSULE | Freq: Every day | ORAL | 0 refills | Status: DC
Start: 2020-11-12 — End: 2020-12-07

## 2020-11-12 NOTE — Progress Notes (Signed)
In and Out Catheterization  Patient is present today for a I & O catheterization due to dysuria. Patient was cleaned and prepped in a sterile fashion with betadine . A 14FR cath was inserted, no complications were noted, of urine return was noted, urine was yellow in color. A clean urine sample was collected for urinalysis. Bladder was drained  And catheter was removed with out difficulty.    Performed by: Franchot Erichsen CMA

## 2020-11-12 NOTE — Progress Notes (Signed)
11/12/2020 12:25 PM   Ronnie Bullock 1946-10-02 [id number]  CC: Chief Complaint  Patient presents with  . Dysuria   HPI: Ronnie Bullock is a 75 y.o. 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   He presented to the emergency department on 10/30/2020 with reports of a 42-month history of progressive dizziness, scrotal swelling and pruritus, and penile pain.  UA benign, G/C/trich negative at that visit.  He underwent scrotal ultrasound with findings of large bilateral hydroceles without evidence of torsion.  He was prescribed Keflex for possible scrotal cellulitis at that time.  Notably, he also reported a recent history of dizziness with standing with positive orthostatic vitals and amlodipine dose was decreased.  Follow-up with PCP on 11/10/2020 revealed improvement of his scrotal edema, resolution of pruritus, and resolution of orthostasis.  Today he reports an approximate 5-month history of urinary frequency and nocturia x4.  He does not believe he is emptying his bladder well.  His previous prescription for Flomax ran out and was not renewed.  He states he tolerated this medication well without any dizziness.  He was bladder scanned in clinic with a finding of 174 mL then subsequently catheterized with 130 mL urinary output. In-office catheterized UA today positive for 1+ blood and 3+ protein; urine microscopy with granular casts, amorphous crystals, and many bacteria.  PMH: Past Medical History:  Diagnosis Date  . Hyperlipidemia   . Hypertension   . Incontinence     Surgical History: Past Surgical History:  Procedure Laterality Date  . COLONOSCOPY WITH PROPOFOL N/A 05/16/2018   Procedure: COLONOSCOPY WITH PROPOFOL;  Surgeon: Pasty Spillers, MD;  Location: [facility] ENDOSCOPY;  Service: Endoscopy;  Laterality: N/A;    Home  Medications:  Allergies as of 11/12/2020   No Known Allergies     Medication List       Accurate as of November 12, 2020 12:25 PM. If you have any questions, ask your nurse or doctor.        amLODipine 5 MG tablet Commonly known as: NORVASC Take 1 tablet (5 mg total) by mouth daily.   polyethylene glycol powder 17 GM/SCOOP powder Commonly known as: GLYCOLAX/MIRALAX Take 17 g by mouth daily.       Allergies:  No Known Allergies  Family History: Family History  Problem Relation Age of Onset  . Hypertension Mother   . Prostate cancer Neg Hx   . Bladder Cancer Neg Hx   . Kidney cancer Neg Hx     Social History:   reports that he has never smoked. He has never used smokeless tobacco. He reports that he does not drink alcohol and does not use drugs.  Physical Exam: BP 140/70   Pulse 72   Ht 5\' 7"  (1.702 m)   Wt 151 lb (68.5 kg)   BMI 23.65 kg/m   Constitutional:  Alert, no acute distress, nontoxic appearing HEENT: [place], AT Cardiovascular: No clubbing, cyanosis, or edema Respiratory: Normal respiratory effort, no increased work of breathing Skin: No rashes, bruises or suspicious lesions Neurologic: Grossly intact, no focal deficits, moving all 4 extremities Psychiatric: Normal mood and affect  Laboratory Data: Results for orders placed or performed in visit on 11/12/20  Microscopic Examination   Urine  Result Value Ref Range   WBC, UA 0-5 0 - 5 /hpf   RBC 0-2 0 - 2 /hpf  Epithelial Cells (non renal) 0-10 0 - 10 /hpf   Renal Epithel, UA 0-10 (A) None seen /hpf   Casts Present (A) None seen /lpf   Cast Type Granular casts (A) N/A   Crystals Present (A) N/A   Crystal Type Amorphous Sediment N/A   Bacteria, UA Many (A) None seen/Few  Urinalysis, Complete  Result Value Ref Range   Specific Gravity, UA >1.030 (H) 1.005 - 1.030   pH, UA 5.5 5.0 - 7.5   Color, UA Yellow Yellow   Appearance Ur Cloudy (A) Clear   Leukocytes,UA Negative Negative   Protein,UA 3+ (A)  Negative/Trace   Glucose, UA Negative Negative   Ketones, UA Negative Negative   RBC, UA 1+ (A) Negative   Bilirubin, UA Negative Negative   Urobilinogen, Ur 0.2 0.2 - 1.0 mg/dL   Nitrite, UA Negative Negative   Microscopic Examination See below:   BLADDER SCAN AMB NON-IMAGING  Result Value Ref Range   Scan Result 13mL    Assessment & Plan:   1. Incomplete bladder emptying Accompanied by frequency and nocturia with slightly elevated bladder scan today.  Counseled the patient to restart Flomax given his history of tolerating it well without orthostasis.  We will plan for symptom recheck with Dr. Bernardo Heater in about 4 weeks.  UA today notable for bacteriuria, likely representing colonization in the absence of other inflammatory signs.  Will send for culture for further evaluation but defer treatment unless he develops dysuria or other infective symptoms. - Urinalysis, Complete - BLADDER SCAN AMB NON-IMAGING - CULTURE, URINE COMPREHENSIVE - tamsulosin (FLOMAX) 0.4 MG CAPS capsule; Take 1 capsule (0.4 mg total) by mouth daily.  Dispense: 30 capsule; Refill: 0  2. Prostate cancer [facility]) Patient is overdue for prostate cancer follow-up with Dr. Bernardo Heater.  We will schedule this today.  Return in about 4 weeks (around 12/10/2020) for Prostate cancer f/u, symptom recheck, and PVR with Dr. Bernardo Heater.  Debroah Loop, PA-C  [facility] [address] [place], [place] [address] [phone]

## 2020-11-13 LAB — URINALYSIS, COMPLETE
Bilirubin, UA: NEGATIVE
Glucose, UA: NEGATIVE
Ketones, UA: NEGATIVE
Leukocytes,UA: NEGATIVE
Nitrite, UA: NEGATIVE
Specific Gravity, UA: >1.03 — ABNORMAL HIGH (ref 1.005–1.030)
Urobilinogen, Ur: 0.2 mg/dL (ref 0.2–1.0)
pH, UA: 5.5 (ref 5.0–7.5)

## 2020-11-13 LAB — MICROSCOPIC EXAMINATION

## 2020-11-15 ENCOUNTER — Other Ambulatory Visit: Payer: Medicare HMO

## 2020-11-16 ENCOUNTER — Encounter: Payer: Self-pay | Admitting: Gastroenterology

## 2020-11-16 ENCOUNTER — Other Ambulatory Visit: Payer: Self-pay

## 2020-11-16 ENCOUNTER — Ambulatory Visit: Payer: Medicare HMO | Admitting: Gastroenterology

## 2020-11-16 VITALS — BP 148/91 | HR 92 | Temp 98.0°F | Ht 67.0 in | Wt 153.2 lb

## 2020-11-16 DIAGNOSIS — R195 Other fecal abnormalities: Secondary | ICD-10-CM

## 2020-11-16 LAB — CULTURE, URINE COMPREHENSIVE

## 2020-11-16 NOTE — Progress Notes (Signed)
Ronnie Darby, MD [address]  [place]  [place], [place] [address]  Main: [phone]  Fax: [phone]    Gastroenterology Consultation  Referring Provider:     Myles Gip, DO Primary Care Physician:  Ronnie Roys, DO Primary Gastroenterologist:  Dr. Cephas Bullock Reason for Consultation:     Dark stools        HPI:   Ronnie Bullock is a 75 y.o. male referred by Dr. Wynetta Bullock, Ronnie Merino, DO  for consultation & management of dark stools.  Patient had history of stage II prostate cancer s/p radiation treatment in 09/2019.  Patient reports that for last 6 months, he has been experiencing dark stools.  FOBT was positive and therefore referred to GI for further evaluation.  He denies any bright red blood per rectum.  He denies abdominal pain, black stools, melena, constipation or diarrhea.  His hemoglobin was 12.5 on 09/23/2020.  He denies smoking or alcohol use.  Patient is accompanied by his pastor today.  Patient lives alone  NSAIDs: None  Antiplts/Anticoagulants/Anti thrombotics: None  GI Procedures:  Colonoscopy for colon cancer screening 05/16/2018 - Two 2 mm polyps in the ascending colon and in the cecum, removed with a cold biopsy forceps. Resected and retrieved. - The examination was otherwise normal. - The rectum, sigmoid colon, descending colon, transverse colon, ascending colon and cecum are normal. - The distal rectum and anal verge are normal on retroflexion view. DIAGNOSIS:  A. COLON POLYP X 2, ASCENDING AND CECUM; COLD BIOPSY:  - TUBULAR ADENOMAS, 2 FRAGMENTS.  - NEGATIVE FOR HIGH-GRADE DYSPLASIA AND MALIGNANCY.  Past Medical History:  Diagnosis Date  . Hyperlipidemia   . Hypertension   . Incontinence     Past Surgical History:  Procedure Laterality Date  . COLONOSCOPY WITH PROPOFOL N/A 05/16/2018   Procedure: COLONOSCOPY WITH PROPOFOL;  Surgeon: Ronnie Manifold, MD;  Location: [facility] ENDOSCOPY;  Service: Endoscopy;  Laterality: N/A;    Current Outpatient Medications:  .  amLODipine (NORVASC) 5 MG tablet, Take 1 tablet (5 mg total) by mouth daily., Disp: 90 tablet, Rfl: 0 .  polyethylene glycol powder (GLYCOLAX/MIRALAX) 17 GM/SCOOP powder, Take 17 g by mouth daily., Disp: 500 g, Rfl: 0 .  tamsulosin (FLOMAX) 0.4 MG CAPS capsule, Take 1 capsule (0.4 mg total) by mouth daily., Disp: 30 capsule, Rfl: 0    Family History  Problem Relation Age of Onset  . Hypertension Mother   . Prostate cancer Neg Hx   . Bladder Cancer Neg Hx   . Kidney cancer Neg Hx      Social History   Tobacco Use  . Smoking status: Never Smoker  . Smokeless tobacco: Never Used  Vaping Use  . Vaping Use: Never used  Substance Use Topics  . Alcohol use: No    Alcohol/week: 0.0 standard drinks  . Drug use: No    Allergies as of 11/16/2020  . (No Known Allergies)    Review of Systems:    All systems reviewed and negative except where noted in HPI.   Physical Exam:  BP (!) 148/91 (BP Location: Left Arm, Patient Position: Sitting, Cuff Size: Normal)   Pulse 92   Temp 98 F (36.7 C) (Oral)   Ht 5\' 7"  (1.702 m)   Wt 153 lb 4 oz (69.5 kg)   BMI 24.00 kg/m  No LMP for male patient.  General:   Alert,  Well-developed, well-nourished, pleasant and cooperative in NAD Head:  Normocephalic  and atraumatic. Eyes:  Sclera clear, no icterus.   Conjunctiva pink. Ears:  Normal auditory acuity. Nose:  No deformity, discharge, or lesions. Mouth:  No deformity or lesions,oropharynx pink & moist. Neck:  Supple; no masses or thyromegaly. Lungs:  Respirations even and unlabored.  Clear throughout to auscultation.   No wheezes, crackles, or rhonchi. No acute distress. Heart:  Regular rate and rhythm; no murmurs, clicks, rubs, or gallops. Abdomen:  Normal bowel sounds. Soft, non-tender and non-distended without masses, hepatosplenomegaly or hernias noted.  No guarding or rebound tenderness.   Rectal: Not performed Msk:  Symmetrical without gross  deformities. Good, equal movement & strength bilaterally. Pulses:  Normal pulses noted. Extremities:  No clubbing or edema.  No cyanosis. Neurologic:  Alert and oriented x3;  grossly normal neurologically. Skin:  Intact without significant lesions or rashes. No jaundice. Psych:  Alert and cooperative. Normal mood and affect.  Imaging Studies: No abdominal imaging  Assessment and Plan:   Ronnie Bullock is a 75 y.o. 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  Therefore, recommend flexible sigmoidoscopy at this time Recheck CBC, iron panel today   Follow up in 3 months   Ronnie Darby, MD

## 2020-11-19 ENCOUNTER — Other Ambulatory Visit
Admission: RE | Admit: 2020-11-19 | Discharge: 2020-11-19 | Disposition: A | Discharge status: Home / Self Care | Payer: Medicare HMO | Source: Ambulatory Visit | Attending: Gastroenterology | Admitting: Gastroenterology

## 2020-11-19 ENCOUNTER — Other Ambulatory Visit: Payer: Self-pay

## 2020-11-19 DIAGNOSIS — Z01812 Encounter for preprocedural laboratory examination: Secondary | ICD-10-CM | POA: Diagnosis not present

## 2020-11-19 DIAGNOSIS — U071 COVID-19: Secondary | ICD-10-CM | POA: Insufficient documentation

## 2020-11-19 LAB — SARS CORONAVIRUS 2 (TAT 6-24 HRS): SARS Coronavirus 2: POSITIVE — AB

## 2020-11-20 ENCOUNTER — Telehealth: Payer: Self-pay

## 2020-11-20 ENCOUNTER — Telehealth: Payer: Self-pay | Admitting: Physician Assistant

## 2020-11-20 NOTE — Telephone Encounter (Signed)
Called to discuss with Ronnie Bullock about Covid symptoms and the use of sotrovimab, remdisivir or oral therapies for those with mild to moderate Covid symptoms and at a high risk of hospitalization.     Pt does not qualify as pt has asymptomatic infection. The pt is fully vaccinated and was tested as a screening for upcoming procedure, which has now been cancelled. They have our hotline number and I sent him a mychart message with more information in the case he goes on to develop symptoms.     Patient Active Problem List   Diagnosis Date Noted  . Scrotal swelling 11/03/2020  . Dizziness 11/03/2020  . Dark stools 09/23/2020  . Prostate cancer ([place]) 07/03/2019  . Elevated PSA 03/28/2019  . 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 Benign neoplasm of ascending colon   . Benign neoplasm of cecum   . Hypercholesteremia 05/02/2018  . Advanced care planning/counseling discussion 11/10/2017  . Essential hypertension, benign 10/10/2017    Angelena Form PA-C

## 2020-11-20 NOTE — Telephone Encounter (Signed)
-----   Message from Lin Landsman, MD sent at 11/20/2020  8:50 AM EST ----- COVID positive, reschedule his procedure, thanks  - RV

## 2020-11-20 NOTE — Telephone Encounter (Signed)
Called and talk to patient sister she states she will contact his PCP doctor. She moved his procedure to 12/21/20

## 2020-11-20 NOTE — Telephone Encounter (Signed)
Message received from [place].  Patient has tested positive for COVID. I will ask Caryl Pina to contact patient to reschedule.  Procedure has been canceled. His sister has been notified.  Thanks,  [place], [place]

## 2020-12-02 ENCOUNTER — Encounter: Payer: Self-pay | Admitting: Emergency Medicine

## 2020-12-02 ENCOUNTER — Emergency Department
Admission: EM | Admit: 2020-12-02 | Discharge: 2020-12-02 | Disposition: A | Discharge status: Home / Self Care | Payer: Medicare HMO | Attending: Emergency Medicine | Admitting: Emergency Medicine

## 2020-12-02 ENCOUNTER — Other Ambulatory Visit: Payer: Self-pay

## 2020-12-02 ENCOUNTER — Emergency Department: Payer: Medicare HMO

## 2020-12-02 DIAGNOSIS — R0902 Hypoxemia: Secondary | ICD-10-CM | POA: Diagnosis not present

## 2020-12-02 DIAGNOSIS — L739 Follicular disorder, unspecified: Secondary | ICD-10-CM | POA: Diagnosis not present

## 2020-12-02 DIAGNOSIS — I6782 Cerebral ischemia: Secondary | ICD-10-CM | POA: Insufficient documentation

## 2020-12-02 DIAGNOSIS — N5089 Other specified disorders of the male genital organs: Secondary | ICD-10-CM | POA: Diagnosis present

## 2020-12-02 DIAGNOSIS — I10 Essential (primary) hypertension: Secondary | ICD-10-CM | POA: Insufficient documentation

## 2020-12-02 DIAGNOSIS — Z79899 Other long term (current) drug therapy: Secondary | ICD-10-CM | POA: Insufficient documentation

## 2020-12-02 DIAGNOSIS — J324 Chronic pansinusitis: Secondary | ICD-10-CM | POA: Diagnosis not present

## 2020-12-02 DIAGNOSIS — L02225 Furuncle of perineum: Secondary | ICD-10-CM | POA: Diagnosis not present

## 2020-12-02 DIAGNOSIS — R58 Hemorrhage, not elsewhere classified: Secondary | ICD-10-CM | POA: Diagnosis not present

## 2020-12-02 DIAGNOSIS — R4182 Altered mental status, unspecified: Secondary | ICD-10-CM | POA: Insufficient documentation

## 2020-12-02 DIAGNOSIS — R410 Disorientation, unspecified: Secondary | ICD-10-CM | POA: Diagnosis not present

## 2020-12-02 DIAGNOSIS — R404 Transient alteration of awareness: Secondary | ICD-10-CM | POA: Diagnosis not present

## 2020-12-02 LAB — CBC WITH DIFFERENTIAL/PLATELET
Abs Immature Granulocytes: 0.01 10*3/uL (ref 0.00–0.07)
Basophils Absolute: 0 10*3/uL (ref 0.0–0.1)
Basophils Relative: 1 %
Eosinophils Absolute: 0.4 10*3/uL (ref 0.0–0.5)
Eosinophils Relative: 8 %
HCT: 41 % (ref 39.0–52.0)
Hemoglobin: 13.5 g/dL (ref 13.0–17.0)
Immature Granulocytes: 0 %
Lymphocytes Relative: 19 %
Lymphs Abs: 1 10*3/uL (ref 0.7–4.0)
MCH: 30.3 pg (ref 26.0–34.0)
MCHC: 32.9 g/dL (ref 30.0–36.0)
MCV: 91.9 fL (ref 80.0–100.0)
Monocytes Absolute: 0.7 10*3/uL (ref 0.1–1.0)
Monocytes Relative: 13 %
Neutro Abs: 3.1 10*3/uL (ref 1.7–7.7)
Neutrophils Relative %: 59 %
Platelets: 202 10*3/uL (ref 150–400)
RBC: 4.46 MIL/uL (ref 4.22–5.81)
RDW: 13.7 % (ref 11.5–15.5)
WBC: 5.2 10*3/uL (ref 4.0–10.5)
nRBC: 0 % (ref 0.0–0.2)

## 2020-12-02 LAB — COMPREHENSIVE METABOLIC PANEL
ALT: 9 U/L (ref 0–44)
AST: 24 U/L (ref 15–41)
Albumin: 3 g/dL — ABNORMAL LOW (ref 3.5–5.0)
Alkaline Phosphatase: 78 U/L (ref 38–126)
Anion gap: 8 (ref 5–15)
BUN: 17 mg/dL (ref 8–23)
CO2: 28 mmol/L (ref 22–32)
Calcium: 8.9 mg/dL (ref 8.9–10.3)
Chloride: 100 mmol/L (ref 98–111)
Creatinine, Ser: 1.16 mg/dL (ref 0.61–1.24)
GFR, Estimated: >60 mL/min (ref >60)
Glucose, Bld: 111 mg/dL — ABNORMAL HIGH (ref 70–99)
Potassium: 4.2 mmol/L (ref 3.5–5.1)
Sodium: 136 mmol/L (ref 135–145)
Total Bilirubin: 0.5 mg/dL (ref 0.3–1.2)
Total Protein: 7.7 g/dL (ref 6.5–8.1)

## 2020-12-02 LAB — CBC
HCT: 40.5 % (ref 39.0–52.0)
Hemoglobin: 13.1 g/dL (ref 13.0–17.0)
MCH: 29.8 pg (ref 26.0–34.0)
MCHC: 32.3 g/dL (ref 30.0–36.0)
MCV: 92.3 fL (ref 80.0–100.0)
Platelets: 196 10*3/uL (ref 150–400)
RBC: 4.39 MIL/uL (ref 4.22–5.81)
RDW: 13.5 % (ref 11.5–15.5)
WBC: 5.2 10*3/uL (ref 4.0–10.5)
nRBC: 0 % (ref 0.0–0.2)

## 2020-12-02 LAB — URINALYSIS, COMPLETE (UACMP) WITH MICROSCOPIC
Bacteria, UA: NONE SEEN
Bilirubin Urine: NEGATIVE
Glucose, UA: NEGATIVE mg/dL
Hgb urine dipstick: NEGATIVE
Ketones, ur: NEGATIVE mg/dL
Leukocytes,Ua: NEGATIVE
Nitrite: NEGATIVE
Protein, ur: 30 mg/dL — AB
Specific Gravity, Urine: 1.019 (ref 1.005–1.030)
Squamous Epithelial / HPF: NONE SEEN (ref 0–5)
pH: 5 (ref 5.0–8.0)

## 2020-12-02 LAB — APTT: aPTT: 25 seconds (ref 24–36)

## 2020-12-02 LAB — PROTIME-INR
INR: 1 (ref 0.8–1.2)
Prothrombin Time: 13.1 seconds (ref 11.4–15.2)

## 2020-12-02 LAB — LIPASE, BLOOD: Lipase: 27 U/L (ref 11–51)

## 2020-12-02 NOTE — ED Notes (Signed)
Spoke to sister on phone and she says patient lives in an apartment-not group  Home.  He is going to be living with  Her soon.  Her phone number is in chart, but it may not work, so call her husbands number Warehouse manager) [phone] if needed.  She says he is here due to vomiting all weekend and this am.

## 2020-12-02 NOTE — ED Notes (Signed)
Noticed druing triage that patient has trouble finding words at times.  He cannot tell me he is at the hospital, but seems to be thinking hard about saying the word.  I asked if this is new and he says no.

## 2020-12-02 NOTE — ED Provider Notes (Signed)
[facility] Emergency Department Provider Note   ____________________________________________   Event Date/Time   First MD Initiated Contact with Patient 12/02/20 1449     (approximate)  I have reviewed the triage vital signs and the nursing notes.   HISTORY  Chief Complaint Penis Pain    HPI Ronnie Bullock is a 75 y.o. male with a stated past medical history of hypertension, hyperlipidemia, and chronic scrotal swelling who presents for lesions on his scrotum.  Patient states that he first noticed them last night and had been putting cream on them but feels that he needed to be seen by a physician.  Patient does not know what type of cream he has been using and denies it making symptoms any better or worse.  Patient currently denies any vision changes, tinnitus, difficulty speaking, facial droop, sore throat, chest pain, shortness of breath, abdominal pain, nausea/vomiting/diarrhea, dysuria, or weakness/numbness/paresthesias in any extremity         Past Medical History:  Diagnosis Date  . Hyperlipidemia   . Hypertension   . Incontinence     Patient Active Problem List   Diagnosis Date Noted  . Scrotal swelling 11/03/2020  . Dizziness 11/03/2020  . Dark stools 09/23/2020  . Prostate cancer ([place]) 07/03/2019  . Elevated PSA 03/28/2019  . 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 Benign neoplasm of ascending colon   . Benign neoplasm of cecum   . Hypercholesteremia 05/02/2018  . Advanced care planning/counseling discussion 11/10/2017  . Essential hypertension, benign 10/10/2017    Past Surgical History:  Procedure Laterality Date  . COLONOSCOPY WITH PROPOFOL N/A 05/16/2018   Procedure: COLONOSCOPY WITH PROPOFOL;  Surgeon: Virgel Manifold, MD;  Location: [facility] ENDOSCOPY;  Service: Endoscopy;  Laterality: N/A;    Prior to Admission medications   Medication Sig Start Date End Date Taking? Authorizing Provider  amLODipine (NORVASC) 5 MG tablet  Take 1 tablet (5 mg total) by mouth daily. 11/03/20 02/01/21  Myles Gip, DO  polyethylene glycol powder (GLYCOLAX/MIRALAX) 17 GM/SCOOP powder Take 17 g by mouth daily. 09/23/20   Myles Gip, DO  tamsulosin (FLOMAX) 0.4 MG CAPS capsule Take 1 capsule (0.4 mg total) by mouth daily. 11/12/20   Debroah Loop, PA-C    Allergies Patient has no known allergies.  Family History  Problem Relation Age of Onset  . Hypertension Mother   . Prostate cancer Neg Hx   . Bladder Cancer Neg Hx   . Kidney cancer Neg Hx     Social History Social History   Tobacco Use  . Smoking status: Never Smoker  . Smokeless tobacco: Never Used  Vaping Use  . Vaping Use: Never used  Substance Use Topics  . Alcohol use: No    Alcohol/week: 0.0 standard drinks  . Drug use: No    Review of Systems Constitutional: No fever/chills Eyes: No visual changes. ENT: No sore throat. Cardiovascular: Denies chest pain. Respiratory: Denies shortness of breath. Gastrointestinal: No abdominal pain.  No nausea, no vomiting.  No diarrhea. Genitourinary: Negative for dysuria. Musculoskeletal: Negative for acute arthralgias Skin: Negative for rash. Neurological: Negative for headaches, weakness/numbness/paresthesias in any extremity Psychiatric: Negative for suicidal ideation/homicidal ideation   ____________________________________________   PHYSICAL EXAM:  VITAL SIGNS: ED Triage Vitals  Enc Vitals Group     BP 12/02/20 1023 (!) 147/81     Pulse Rate 12/02/20 1023 90     Resp 12/02/20 1023 18     Temp 12/02/20 1023 98.5 F (36.9 C)  Temp Source 12/02/20 1023 Oral     SpO2 12/02/20 1023 100 %     Weight 12/02/20 1024 153 lb 3.5 oz (69.5 kg)     Height 12/02/20 1024 5\' 7"  (1.702 m)     Head Circumference --      Peak Flow --      Pain Score --      Pain Loc --      Pain Edu? --      Excl. in [place]? --    Constitutional: Alert and oriented. Well appearing and in no acute  distress. Eyes: Conjunctivae are normal. PERRL. Head: Atraumatic. Nose: No congestion/rhinnorhea. Mouth/Throat: Mucous membranes are moist. Neck: No stridor Cardiovascular: Grossly normal heart sounds.  Good peripheral circulation. Respiratory: Normal respiratory effort.  No retractions. Gastrointestinal: Soft and nontender. No distention. Genitourinary: Normal external male genitalia with multiple areas of erythema and mild induration around multiple hair follicles of the scrotum Musculoskeletal: No obvious deformities Neurologic:  Normal speech and language. No gross focal neurologic deficits are appreciated. Skin:  Skin is warm and dry. No rash noted. Psychiatric: Mood and affect are normal. Speech and behavior are normal.  ____________________________________________   LABS (all labs ordered are listed, but only abnormal results are displayed)  Labs Reviewed  COMPREHENSIVE METABOLIC PANEL - Abnormal; Notable for the following components:      Result Value   Glucose, Bld 111 (*)    Albumin 3.0 (*)    All other components within normal limits  URINALYSIS, COMPLETE (UACMP) WITH MICROSCOPIC - Abnormal; Notable for the following components:   Color, Urine YELLOW (*)    APPearance CLEAR (*)    Protein, ur 30 (*)    All other components within normal limits  LIPASE, BLOOD  CBC  CBC WITH DIFFERENTIAL/PLATELET  PROTIME-INR  APTT  DIFFERENTIAL   ____________________________________________  EKG  ED ECG REPORT I, Naaman Plummer, the attending physician, personally viewed and interpreted this ECG.  Date: 12/02/2020 EKG Time: 1055 Rate: 92 Rhythm: normal sinus rhythm QRS Axis: normal Intervals: normal ST/T Wave abnormalities: normal Narrative Interpretation: no evidence of acute ischemia  ____________________________________________  RADIOLOGY  ED MD interpretation: CT of the head without contrast shows no acute findings of fracture calvarium, active intracranial  hemorrhage, edema, or obvious masses.  There is evidence of chronic microvascular ischemic changes with generalized cerebral volume loss and chronic pansinus disease  Official radiology report(s): CT HEAD WO CONTRAST  Result Date: 12/02/2020 CLINICAL DATA:  Altered mental status EXAM: CT HEAD WITHOUT CONTRAST TECHNIQUE: Contiguous axial images were obtained from the base of the skull through the vertex without intravenous contrast. COMPARISON:  02/20/2014 FINDINGS: Brain: No evidence of acute infarction, hemorrhage, hydrocephalus, extra-axial collection or mass lesion/mass effect. 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 Moderate low-density changes within the periventricular and subcortical white matter compatible with chronic microvascular ischemic change. Mild diffuse cerebral volume loss. Vascular: Atherosclerotic calcifications involving the large vessels of the skull base. No unexpected hyperdense vessel. Skull: Normal. Negative for fracture or focal lesion. Sinuses/Orbits: Chronic pansinus disease with complete opacification and mild expansion of the right frontal sinus. Mastoid air cells are clear. Orbital structures within normal limits. Other: None. IMPRESSION: 1. No acute intracranial findings. 2. 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 3. Chronic pansinus disease. Electronically Signed   By: Davina Poke D.O.   On: 12/02/2020 12:05    ____________________________________________   PROCEDURES  Procedure(s) performed (including  Critical Care):  Procedures   ____________________________________________   INITIAL IMPRESSION / ASSESSMENT AND PLAN / ED COURSE  As part of my medical decision making, I reviewed the following data within the [place] notes reviewed and incorporated, Labs reviewed, EKG interpreted, Old chart reviewed, Radiograph reviewed and Notes from  prior ED visits reviewed and incorporated        Patient is a 75 year old male who presents for lesions to his scrotum consistent with folliculitis.  Patient was informed that there were no abscesses that had formed here and that he only needed to keep this area clean to prevent any further lesions from progressing.  Other differential diagnoses entertained and ruled out during patient's visit include: CVA, STD, sepsis, lymphogranuloma venereum Patient's laboratory evaluation including urinalysis show no evidence of acute infection. The patient has been reexamined and is ready to be discharged.  All diagnostic results have been reviewed and discussed with the patient/family.  Care plan has been outlined and the patient/family understands all current diagnoses, results, and treatment plans.  There are no new complaints, changes, or physical findings at this time.  All questions have been addressed and answered. Patient was instructed to, and agrees to follow-up with their primary care physician as well as return to the emergency department if any new or worsening symptoms develop.      ____________________________________________   FINAL CLINICAL IMPRESSION(S) / ED DIAGNOSES  Final diagnoses:  Folliculitis of perineum     ED Discharge Orders    None       Note:  This document was prepared using Dragon voice recognition software and may include unintentional dictation errors.   Naaman Plummer, MD 12/02/20 [phone]

## 2020-12-02 NOTE — ED Triage Notes (Addendum)
Per ems called out for hemorrhage .  Patient at an ?adult home.  Not feeling well for 1 days.   152/93 94 99 on RA [id number].  A bit confused.  No abnormal findings on stroke scale.  Also told them something about his genitalia.      Spoke to patient and he says he is not bleeding anywhere.  Says he lives at a group home and he does not have a guardian.  He says he is here because he was rubbing something on penis to make it better and it is worse.  Doe not know what it was.

## 2020-12-05 ENCOUNTER — Other Ambulatory Visit: Payer: Self-pay | Admitting: Physician Assistant

## 2020-12-05 DIAGNOSIS — R339 Retention of urine, unspecified: Secondary | ICD-10-CM

## 2020-12-10 ENCOUNTER — Encounter: Payer: Self-pay | Admitting: Urology

## 2020-12-10 ENCOUNTER — Other Ambulatory Visit: Payer: Self-pay

## 2020-12-10 ENCOUNTER — Ambulatory Visit (INDEPENDENT_AMBULATORY_CARE_PROVIDER_SITE_OTHER): Payer: Medicare HMO | Admitting: Urology

## 2020-12-10 VITALS — BP 147/78 | HR 98 | Ht 66.0 in | Wt 149.0 lb

## 2020-12-10 DIAGNOSIS — R399 Unspecified symptoms and signs involving the genitourinary system: Secondary | ICD-10-CM

## 2020-12-10 DIAGNOSIS — C61 Malignant neoplasm of prostate: Secondary | ICD-10-CM | POA: Diagnosis not present

## 2020-12-10 DIAGNOSIS — K4090 Unilateral inguinal hernia, without obstruction or gangrene, not specified as recurrent: Secondary | ICD-10-CM | POA: Diagnosis not present

## 2020-12-10 NOTE — Progress Notes (Signed)
   12/10/2020 1:39 PM   Ronnie Bullock [birth year] [id number]  Referring provider: [name], DO [place],  [place] [address]  Chief Complaint  Patient presents with  . Prostate Cancer    Urologic history: 1.  Intermediate risk (unfavorable) prostate cancer -Diagnosed 06/2019; prostate volume 63 g; PSA 5.7 -IMRT completed 10/02/2019   HPI: 75 y.o. male presents for follow-up.   He has been followed in radiation oncology but I have not seen since fiducial markers were placed August 2020  He saw Debroah Loop 11/12/2020 with lower urinary tract symptoms, elevated residual 174 mL  Restarted on tamsulosin  Urine culture was negative  Last PSA 08/05/2020 was 1.03  Last seen radiation oncology October 2021  As noted marked improvement in his voiding symptoms with restarting tamsulosin and is currently satisfied with his voiding pattern      PMH: Past Medical History:  Diagnosis Date  . Hyperlipidemia   . Hypertension   . Incontinence     Surgical History: Past Surgical History:  Procedure Laterality Date  . COLONOSCOPY WITH PROPOFOL N/A 05/16/2018   Procedure: COLONOSCOPY WITH PROPOFOL;  Surgeon: Virgel Manifold, MD;  Location: [facility] ENDOSCOPY;  Service: Endoscopy;  Laterality: N/A;    Home Medications:  Allergies as of 12/10/2020   No Known Allergies     Medication List       Accurate as of December 10, 2020  1:39 PM. If you have any questions, ask your nurse or doctor.        amLODipine 5 MG tablet Commonly known as: NORVASC Take 1 tablet (5 mg total) by mouth daily.   polyethylene glycol powder 17 GM/SCOOP powder Commonly known as: GLYCOLAX/MIRALAX Take 17 g by mouth daily.   tamsulosin 0.4 MG Caps capsule Commonly known as: FLOMAX TAKE 1 CAPSULE BY MOUTH EVERY DAY       Allergies: No Known Allergies  Family History: Family History  Problem Relation Age of Onset  . Hypertension Mother   . Prostate cancer Neg Hx   .  Bladder Cancer Neg Hx   . Kidney cancer Neg Hx     Social History:  reports that he has never smoked. He has never used smokeless tobacco. He reports that he does not drink alcohol and does not use drugs.   Physical Exam: BP (!) 147/78   Pulse 98   Ht 5\' 6"  (1.676 m)   Wt 149 lb (67.6 kg)   BMI 24.05 kg/m   Constitutional:  Alert, No acute distress. HEENT: [place] AT, moist mucus membranes.  Trachea midline, no masses. Cardiovascular: No clubbing, cyanosis, or edema. Respiratory: Normal respiratory effort, no increased work of breathing. GI: Abdomen is soft, nontender, nondistended, no abdominal masses GU: + Right inguinal hernia, no induration or tenderness; testes descended bilaterally, moderate scrotal edema without erythema   Assessment & Plan:    1.  Intermediate risk prostate cancer  Status post IMRT; radiation oncology follow-up scheduled April 2022  Follow-up with me October 2022  2.  Lower urinary tract symptoms  Improved on tamsulosin  Follow-up 6 months with [facility]  3.  Right inguinal hernia  Asymptomatic  Discussed signs of incarceration  General surgery referral offered   Abbie Sons, MD  [facility] [address] [place], [place] [address] [phone]

## 2020-12-17 ENCOUNTER — Other Ambulatory Visit: Payer: Medicare HMO

## 2020-12-17 ENCOUNTER — Telehealth: Payer: Self-pay | Admitting: Gastroenterology

## 2020-12-17 NOTE — Telephone Encounter (Signed)
Patient has to cancel his procedure on 12/21/20 due to a family emergency. Daughter said that she will call us back to r/s.

## 2020-12-17 NOTE — Telephone Encounter (Signed)
Called Trish and canceled the procedure documented in the referral

## 2020-12-18 ENCOUNTER — Ambulatory Visit: Payer: Medicare HMO

## 2020-12-21 ENCOUNTER — Ambulatory Visit: Admission: RE | Admit: 2020-12-21 | Payer: Medicare HMO | Source: Home / Self Care | Admitting: Gastroenterology

## 2020-12-21 ENCOUNTER — Encounter: Admission: RE | Payer: Self-pay | Source: Home / Self Care

## 2020-12-21 SURGERY — SIGMOIDOSCOPY, FLEXIBLE
Anesthesia: General

## 2020-12-25 ENCOUNTER — Inpatient Hospital Stay: Payer: Medicare HMO | Admitting: Family Medicine

## 2021-01-19 NOTE — Progress Notes (Deleted)
01/20/2021 9:50 PM   JESSON FOSKEY 1946/04/07 [id number]  Referring provider: [name], DO [place],  [place] [address]  No chief complaint on file.  Urological history: 1. 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 male who presents today for the complaints of penile and testicular pain.  PMH: Past Medical History:  Diagnosis Date  . Hyperlipidemia   . Hypertension   . Incontinence     Surgical History: Past Surgical History:  Procedure Laterality Date  . COLONOSCOPY WITH PROPOFOL N/A 05/16/2018   Procedure: COLONOSCOPY WITH PROPOFOL;  Surgeon: Virgel Manifold, MD;  Location: [facility] ENDOSCOPY;  Service: Endoscopy;  Laterality: N/A;    Home Medications:  Allergies as of 01/20/2021   No Known Allergies     Medication List       Accurate as of January 19, 2021  9:50 PM. If you have any questions, ask your nurse or doctor.        amLODipine 5 MG tablet Commonly known as: NORVASC Take 1 tablet (5 mg total) by mouth daily.   polyethylene glycol powder 17 GM/SCOOP powder Commonly known as: GLYCOLAX/MIRALAX Take 17 g by mouth daily.   tamsulosin 0.4 MG Caps capsule Commonly known as: FLOMAX TAKE 1 CAPSULE BY MOUTH EVERY DAY       Allergies: No Known Allergies  Family History: Family History  Problem Relation Age of Onset  . Hypertension Mother   . Prostate cancer Neg Hx   . Bladder Cancer Neg Hx   . Kidney cancer Neg Hx     Social History:  reports that he has never smoked. He has never used smokeless tobacco. He reports that he does not drink alcohol and does not use drugs.  ROS: Pertinent ROS in HPI  Physical  Exam: There were no vitals taken for this visit.  Constitutional:  Well nourished. Alert and oriented, No acute distress. HEENT: [place] AT, moist mucus membranes.  Trachea midline, no masses. Cardiovascular: No clubbing, cyanosis, or edema. Respiratory: Normal respiratory effort, no increased work of breathing. GI: Abdomen is soft, non tender, non distended, no abdominal masses. Liver and spleen not palpable.  No hernias appreciated.  Stool sample for occult testing is not indicated.   GU: No CVA tenderness.  No bladder fullness or masses.  Patient with circumcised/uncircumcised phallus. ***Foreskin easily retracted***  Urethral meatus is patent.  No penile discharge. No penile lesions or rashes. Scrotum without lesions, cysts, rashes and/or edema.  Testicles are located scrotally bilaterally. No masses are appreciated in the testicles. Left and right epididymis are normal. Rectal: Patient with  normal sphincter tone. Anus and perineum without scarring or rashes. No rectal masses are appreciated. Prostate is approximately *** grams, *** nodules are appreciated. Seminal vesicles are normal. Skin: No rashes, bruises or suspicious lesions. Lymph: No cervical or inguinal adenopathy. Neurologic: Grossly intact, no focal deficits, moving all 4 extremities. Psychiatric: Normal mood and affect.  Laboratory Data: Lab Results  Component Value Date   WBC 5.2 12/02/2020   HGB 13.5 12/02/2020   HCT 41.0 12/02/2020   MCV 91.9 12/02/2020   PLT 202 12/02/2020  Lab Results  Component Value Date   CREATININE 1.16 12/02/2020    Lab Results  Component Value Date   TSH 1.090 01/23/2020       Component Value Date/Time   CHOL 165 09/10/2020 1441   HDL 65 09/10/2020 1441   LDLCALC 91 09/10/2020 1441    Lab Results  Component Value Date   AST 24 12/02/2020   Lab Results  Component Value Date   ALT 9 12/02/2020    Urinalysis ***  I have reviewed the labs.   Pertinent  Imaging: ***   Assessment & Plan:  ***  1. Scrotal pain ***  2. Prostate cancer - appointment with Dr. Donella Stade 07/2021  3. LU TS ***  No follow-ups on file.  These notes generated with voice recognition software. I apologize for typographical errors.  Zara Council, PA-C  [facility] [address]  [place], [place] [address] [phone]

## 2021-01-20 ENCOUNTER — Telehealth: Payer: Self-pay

## 2021-01-20 ENCOUNTER — Emergency Department: Payer: Medicare HMO

## 2021-01-20 ENCOUNTER — Inpatient Hospital Stay
Admission: EM | Admit: 2021-01-20 | Discharge: 2021-01-25 | DRG: 271 | Disposition: A | Discharge status: Home Health | Payer: Medicare HMO | Attending: Internal Medicine | Admitting: Internal Medicine

## 2021-01-20 ENCOUNTER — Ambulatory Visit: Payer: Medicare HMO | Admitting: Urology

## 2021-01-20 ENCOUNTER — Other Ambulatory Visit: Payer: Self-pay

## 2021-01-20 DIAGNOSIS — I82409 Acute embolism and thrombosis of unspecified deep veins of unspecified lower extremity: Secondary | ICD-10-CM | POA: Diagnosis present

## 2021-01-20 DIAGNOSIS — I82412 Acute embolism and thrombosis of left femoral vein: Principal | ICD-10-CM | POA: Diagnosis present

## 2021-01-20 DIAGNOSIS — I824Y2 Acute embolism and thrombosis of unspecified deep veins of left proximal lower extremity: Secondary | ICD-10-CM

## 2021-01-20 DIAGNOSIS — Z8616 Personal history of COVID-19: Secondary | ICD-10-CM | POA: Diagnosis not present

## 2021-01-20 DIAGNOSIS — I82432 Acute embolism and thrombosis of left popliteal vein: Secondary | ICD-10-CM | POA: Diagnosis present

## 2021-01-20 DIAGNOSIS — C61 Malignant neoplasm of prostate: Secondary | ICD-10-CM | POA: Diagnosis present

## 2021-01-20 DIAGNOSIS — R000 Tachycardia, unspecified: Secondary | ICD-10-CM | POA: Diagnosis not present

## 2021-01-20 DIAGNOSIS — Z8546 Personal history of malignant neoplasm of prostate: Secondary | ICD-10-CM | POA: Diagnosis not present

## 2021-01-20 DIAGNOSIS — M7989 Other specified soft tissue disorders: Secondary | ICD-10-CM

## 2021-01-20 DIAGNOSIS — I82492 Acute embolism and thrombosis of other specified deep vein of left lower extremity: Secondary | ICD-10-CM | POA: Diagnosis not present

## 2021-01-20 DIAGNOSIS — R399 Unspecified symptoms and signs involving the genitourinary system: Secondary | ICD-10-CM

## 2021-01-20 DIAGNOSIS — I10 Essential (primary) hypertension: Secondary | ICD-10-CM | POA: Diagnosis present

## 2021-01-20 DIAGNOSIS — I129 Hypertensive chronic kidney disease with stage 1 through stage 4 chronic kidney disease, or unspecified chronic kidney disease: Secondary | ICD-10-CM | POA: Diagnosis present

## 2021-01-20 DIAGNOSIS — R17 Unspecified jaundice: Secondary | ICD-10-CM | POA: Diagnosis not present

## 2021-01-20 DIAGNOSIS — I82512 Chronic embolism and thrombosis of left femoral vein: Secondary | ICD-10-CM | POA: Diagnosis not present

## 2021-01-20 DIAGNOSIS — E7800 Pure hypercholesterolemia, unspecified: Secondary | ICD-10-CM | POA: Diagnosis not present

## 2021-01-20 DIAGNOSIS — F0390 Unspecified dementia without behavioral disturbance: Secondary | ICD-10-CM

## 2021-01-20 DIAGNOSIS — Z79899 Other long term (current) drug therapy: Secondary | ICD-10-CM | POA: Diagnosis not present

## 2021-01-20 DIAGNOSIS — N400 Enlarged prostate without lower urinary tract symptoms: Secondary | ICD-10-CM | POA: Diagnosis not present

## 2021-01-20 DIAGNOSIS — R2242 Localized swelling, mass and lump, left lower limb: Secondary | ICD-10-CM | POA: Diagnosis not present

## 2021-01-20 DIAGNOSIS — Z7901 Long term (current) use of anticoagulants: Secondary | ICD-10-CM | POA: Diagnosis not present

## 2021-01-20 DIAGNOSIS — R600 Localized edema: Secondary | ICD-10-CM

## 2021-01-20 DIAGNOSIS — E785 Hyperlipidemia, unspecified: Secondary | ICD-10-CM | POA: Diagnosis present

## 2021-01-20 DIAGNOSIS — N182 Chronic kidney disease, stage 2 (mild): Secondary | ICD-10-CM | POA: Diagnosis present

## 2021-01-20 DIAGNOSIS — N5082 Scrotal pain: Secondary | ICD-10-CM

## 2021-01-20 DIAGNOSIS — J9811 Atelectasis: Secondary | ICD-10-CM | POA: Diagnosis not present

## 2021-01-20 HISTORY — DX: Acute embolism and thrombosis of unspecified deep veins of unspecified lower extremity: I82.409

## 2021-01-20 HISTORY — DX: Unspecified dementia, unspecified severity, without behavioral disturbance, psychotic disturbance, mood disturbance, and anxiety: F03.90

## 2021-01-20 LAB — COMPREHENSIVE METABOLIC PANEL
ALT: 12 U/L (ref 0–44)
AST: 23 U/L (ref 15–41)
Albumin: 2.6 g/dL — ABNORMAL LOW (ref 3.5–5.0)
Alkaline Phosphatase: 67 U/L (ref 38–126)
Anion gap: 8 (ref 5–15)
BUN: 18 mg/dL (ref 8–23)
CO2: 28 mmol/L (ref 22–32)
Calcium: 8.7 mg/dL — ABNORMAL LOW (ref 8.9–10.3)
Chloride: 102 mmol/L (ref 98–111)
Creatinine, Ser: 1.28 mg/dL — ABNORMAL HIGH (ref 0.61–1.24)
GFR, Estimated: 58 mL/min — ABNORMAL LOW (ref >60)
Glucose, Bld: 124 mg/dL — ABNORMAL HIGH (ref 70–99)
Potassium: 4.4 mmol/L (ref 3.5–5.1)
Sodium: 138 mmol/L (ref 135–145)
Total Bilirubin: 1.6 mg/dL — ABNORMAL HIGH (ref 0.3–1.2)
Total Protein: 7.4 g/dL (ref 6.5–8.1)

## 2021-01-20 LAB — CBC WITH DIFFERENTIAL/PLATELET
Abs Immature Granulocytes: 0.07 10*3/uL (ref 0.00–0.07)
Basophils Absolute: 0 10*3/uL (ref 0.0–0.1)
Basophils Relative: 0 %
Eosinophils Absolute: 0.2 10*3/uL (ref 0.0–0.5)
Eosinophils Relative: 2 %
HCT: 39.8 % (ref 39.0–52.0)
Hemoglobin: 12.9 g/dL — ABNORMAL LOW (ref 13.0–17.0)
Immature Granulocytes: 1 %
Lymphocytes Relative: 9 %
Lymphs Abs: 0.9 10*3/uL (ref 0.7–4.0)
MCH: 29.8 pg (ref 26.0–34.0)
MCHC: 32.4 g/dL (ref 30.0–36.0)
MCV: 91.9 fL (ref 80.0–100.0)
Monocytes Absolute: 1.9 10*3/uL — ABNORMAL HIGH (ref 0.1–1.0)
Monocytes Relative: 19 %
Neutro Abs: 6.7 10*3/uL (ref 1.7–7.7)
Neutrophils Relative %: 69 %
Platelets: 209 10*3/uL (ref 150–400)
RBC: 4.33 MIL/uL (ref 4.22–5.81)
RDW: 13.9 % (ref 11.5–15.5)
WBC: 9.8 10*3/uL (ref 4.0–10.5)
nRBC: 0 % (ref 0.0–0.2)

## 2021-01-20 LAB — HEPARIN LEVEL (UNFRACTIONATED): Heparin Unfractionated: <0.1 IU/mL — ABNORMAL LOW (ref 0.30–0.70)

## 2021-01-20 LAB — APTT: aPTT: <24 seconds — ABNORMAL LOW (ref 24–36)

## 2021-01-20 LAB — PROTIME-INR
INR: 1.2 (ref 0.8–1.2)
Prothrombin Time: 15.1 seconds (ref 11.4–15.2)

## 2021-01-20 MED ORDER — ADULT MULTIVITAMIN W/MINERALS CH
1.0000 | ORAL_TABLET | Freq: Every day | ORAL | Status: DC
  Administered 2021-01-21 – 2021-01-25 (×5): 1 via ORAL
  Filled 2021-01-20 (×5): qty 1

## 2021-01-20 MED ORDER — ONDANSETRON HCL 4 MG/2ML IJ SOLN: 4.0000 mg | Freq: Four times a day (QID) | INTRAMUSCULAR | Status: DC | PRN

## 2021-01-20 MED ORDER — SODIUM CHLORIDE 0.9 % IV SOLN: INTRAVENOUS | Status: AC

## 2021-01-20 MED ORDER — HEPARIN (PORCINE) 25000 UT/250ML-% IV SOLN
1700.0000 [IU]/h | INTRAVENOUS | Status: DC
  Administered 2021-01-20: 1200 [IU]/h via INTRAVENOUS
  Administered 2021-01-21: 1500 [IU]/h via INTRAVENOUS
  Administered 2021-01-22: 1700 [IU]/h via INTRAVENOUS
  Filled 2021-01-20 (×3): qty 250

## 2021-01-20 MED ORDER — ACETAMINOPHEN 325 MG PO TABS: 650.0000 mg | ORAL_TABLET | Freq: Four times a day (QID) | ORAL | Status: DC | PRN

## 2021-01-20 MED ORDER — HEPARIN BOLUS VIA INFUSION
2200.0000 [IU] | Freq: Once | INTRAVENOUS | Status: AC
  Administered 2021-01-20: 2200 [IU] via INTRAVENOUS
  Filled 2021-01-20: qty 2200

## 2021-01-20 MED ORDER — HEPARIN BOLUS VIA INFUSION
4500.0000 [IU] | Freq: Once | INTRAVENOUS | Status: AC
  Administered 2021-01-20: 4500 [IU] via INTRAVENOUS
  Filled 2021-01-20: qty 4500

## 2021-01-20 MED ORDER — AMLODIPINE BESYLATE 5 MG PO TABS
2.5000 mg | ORAL_TABLET | Freq: Every day | ORAL | Status: DC
  Administered 2021-01-20 – 2021-01-25 (×5): 2.5 mg via ORAL
  Filled 2021-01-20 (×5): qty 1

## 2021-01-20 NOTE — Telephone Encounter (Signed)
Patient's brother called stating patient's leg and foot are very swollen and painful today. Patient cannot walk, he is going to take him to the ER. He is calling to cancel his appointment for today for penile/testile swelling.

## 2021-01-20 NOTE — ED Provider Notes (Signed)
[facility] Emergency Department Provider Note    Event Date/Time   First MD Initiated Contact with Patient 01/20/21 1104     (approximate)  I have reviewed the triage vital signs and the nursing notes.   HISTORY  Chief Complaint Groin Swelling and Leg Swelling    HPI Ronnie Bullock is a 75 y.o. male below listed past medical history presents to the ER for evaluation of left leg swelling popped up over the past few days.  Denies any focal trauma but says that he has fallen secondary to weakness caused by the swelling.  Denies any measured fevers.  Not currently on any medications.  His brother is on the brought him in today.  Somewhat of a poor historian.    Past Medical History:  Diagnosis Date   Dementia ([place])    Hyperlipidemia    Hypertension    Incontinence    Family History  Problem Relation Age of Onset   Hypertension Mother    Prostate cancer Neg Hx    Bladder Cancer Neg Hx    Kidney cancer Neg Hx    Past Surgical History:  Procedure Laterality Date   COLONOSCOPY WITH PROPOFOL N/A 05/16/2018   Procedure: COLONOSCOPY WITH PROPOFOL;  Surgeon: Virgel Manifold, MD;  Location: [facility] ENDOSCOPY;  Service: Endoscopy;  Laterality: N/A;   Patient Active Problem List   Diagnosis Date Noted   DVT (deep venous thrombosis) ([place]) 01/20/2021   Scrotal swelling 11/03/2020   Dizziness 11/03/2020   Dark stools 09/23/2020   Prostate cancer ([place]) 07/03/2019   Elevated PSA 03/28/2019   Encysted hydrocele 03/28/2019   Benign neoplasm of ascending colon    Benign neoplasm of cecum    Hypercholesteremia 05/02/2018   Advanced care planning/counseling discussion 11/10/2017   Essential hypertension, benign 10/10/2017      Prior to Admission medications   Medication Sig Start Date End Date Taking? Authorizing Provider  amLODipine (NORVASC) 5 MG tablet Take 1 tablet (5 mg total) by mouth daily. 11/03/20 02/01/21  Myles Gip, DO  polyethylene glycol powder (GLYCOLAX/MIRALAX) 17 GM/SCOOP powder Take 17 g by mouth daily. 09/23/20   Myles Gip, DO  tamsulosin (FLOMAX) 0.4 MG CAPS capsule TAKE 1 CAPSULE BY MOUTH EVERY DAY 12/07/20   Stoioff, Ronda Fairly, MD    Allergies Patient has no known allergies.    Social History Social History   Tobacco Use   Smoking status: Never Smoker   Smokeless tobacco: Never Used  Scientific laboratory technician Use: Never used  Substance Use Topics   Alcohol use: No    Alcohol/week: 0.0 standard drinks   Drug use: No    Review of Systems Patient denies headaches, rhinorrhea, blurry vision, numbness, shortness of breath, chest pain, edema, cough, abdominal pain, nausea, vomiting, diarrhea, dysuria, fevers, rashes or hallucinations unless otherwise stated above in HPI. ____________________________________________   PHYSICAL EXAM:  VITAL SIGNS: Vitals:   01/20/21 1034 01/20/21 1200  BP: (!) 148/90 (!) 146/96  Pulse: 89 98  Resp: 16 18  SpO2: 96% 100%    Constitutional: Alert and oriented. pleasant Eyes: Conjunctivae are normal.  Head: Atraumatic. Nose: No congestion/rhinnorhea. Mouth/Throat: Mucous membranes are moist.   Neck: No stridor. Painless ROM.  Cardiovascular: Normal rate, regular rhythm. Grossly normal heart sounds.  Good peripheral circulation. Respiratory: Normal respiratory effort.  No retractions. Lungs CTAB. Gastrointestinal: Soft and nontender. No distention. No abdominal bruits. No CVA tenderness. Genitourinary:  Musculoskeletal: L>R  Pitting edema  No joint effusions. Neurologic:  Normal speech and language. No gross focal neurologic deficits are appreciated. No facial droop Skin:  Skin is warm, dry and intact. No rash noted. Psychiatric: Mood and affect are normal. Speech and behavior are normal.  ____________________________________________   LABS (all labs ordered are listed, but only abnormal results are displayed)  Results for orders  placed or performed during the hospital encounter of 01/20/21 (from the past 24 hour(s))  CBC with Differential     Status: Abnormal   Collection Time: 01/20/21 11:29 AM  Result Value Ref Range   WBC 9.8 4.0 - 10.5 K/uL   RBC 4.33 4.22 - 5.81 MIL/uL   Hemoglobin 12.9 (L) 13.0 - 17.0 g/dL   HCT 39.8 39.0 - 52.0 %   MCV 91.9 80.0 - 100.0 fL   MCH 29.8 26.0 - 34.0 pg   MCHC 32.4 30.0 - 36.0 g/dL   RDW 13.9 11.5 - 15.5 %   Platelets 209 150 - 400 K/uL   nRBC 0.0 0.0 - 0.2 %   Neutrophils Relative % 69 %   Neutro Abs 6.7 1.7 - 7.7 K/uL   Lymphocytes Relative 9 %   Lymphs Abs 0.9 0.7 - 4.0 K/uL   Monocytes Relative 19 %   Monocytes Absolute 1.9 (H) 0.1 - 1.0 K/uL   Eosinophils Relative 2 %   Eosinophils Absolute 0.2 0.0 - 0.5 K/uL   Basophils Relative 0 %   Basophils Absolute 0.0 0.0 - 0.1 K/uL   Immature Granulocytes 1 %   Abs Immature Granulocytes 0.07 0.00 - 0.07 K/uL  Comprehensive metabolic panel     Status: Abnormal   Collection Time: 01/20/21 11:29 AM  Result Value Ref Range   Sodium 138 135 - 145 mmol/L   Potassium 4.4 3.5 - 5.1 mmol/L   Chloride 102 98 - 111 mmol/L   CO2 28 22 - 32 mmol/L   Glucose, Bld 124 (H) 70 - 99 mg/dL   BUN 18 8 - 23 mg/dL   Creatinine, Ser 1.28 (H) 0.61 - 1.24 mg/dL   Calcium 8.7 (L) 8.9 - 10.3 mg/dL   Total Protein 7.4 6.5 - 8.1 g/dL   Albumin 2.6 (L) 3.5 - 5.0 g/dL   AST 23 15 - 41 U/L   ALT 12 0 - 44 U/L   Alkaline Phosphatase 67 38 - 126 U/L   Total Bilirubin 1.6 (H) 0.3 - 1.2 mg/dL   GFR, Estimated 58 (L) >60 mL/min   Anion gap 8 5 - 15   ____________________________________________  EKG My review and personal interpretation at Time: 12:10   Indication: leg swelling  Rate: 95  Rhythm: sinus dysrhythmia Axis: normal Other: nonspecific st abn, occasional pac, no stemi ____________________________________________  RADIOLOGY  I personally reviewed all radiographic images ordered to evaluate for the above acute complaints and  reviewed radiology reports and findings.  These findings were personally discussed with the patient.  Please see medical record for radiology report.  ____________________________________________   PROCEDURES  Procedure(s) performed:  Procedures    Critical Care performed: no ____________________________________________   INITIAL IMPRESSION / ASSESSMENT AND PLAN / ED COURSE  Pertinent labs & imaging results that were available during my care of the patient were reviewed by me and considered in my medical decision making (see chart for details).   DDX: DVT, cellulitis, mass, electrolyte abnormality, CHF  JAMYRON REDD is a 75 y.o. who presents to the ED with presents to the ER for evaluation of left lower  extremity swelling becoming increasingly symptomatic.  Does not appear to be infected.  Ultrasound ordered which shows evidence of occlusive extensive DVT of the left common femoral vein.  Discussed case with vascular surgery agrees patient would be a candidate for thrombectomy.  Will heparinize.  Will discuss case with hospitalist for admission.     The patient was evaluated in Emergency Department today for the symptoms described in the history of present illness. He/she was evaluated in the context of the global COVID-19 pandemic, which necessitated consideration that the patient might be at risk for infection with the SARS-CoV-2 virus that causes COVID-19. Institutional protocols and algorithms that pertain to the evaluation of patients at risk for COVID-19 are in a state of rapid change based on information released by regulatory bodies including the CDC and federal and state organizations. These policies and algorithms were followed during the patient's care in the ED.  As part of my medical decision making, I reviewed the following data within the [place] notes reviewed and incorporated, Labs reviewed, notes from prior ED visits and [place] Controlled  Substance Database   ____________________________________________   FINAL CLINICAL IMPRESSION(S) / ED DIAGNOSES  Final diagnoses:  Leg swelling  DVT of deep femoral vein, left (HCC)      NEW MEDICATIONS STARTED DURING THIS VISIT:  New Prescriptions   No medications on file     Note:  This document was prepared using Dragon voice recognition software and may include unintentional dictation errors.    Merlyn Lot, MD 01/20/21 1306

## 2021-01-20 NOTE — Consult Note (Signed)
ANTICOAGULATION CONSULT NOTE  Pharmacy Consult for Heparin  Indication: DVT  No Known Allergies  Patient Measurements: Height: 5\' 10"  (177.8 cm) Weight: 74.8 kg (165 lb) IBW/kg (Calculated) : 73 Heparin Dosing Weight: 74.8 kg  Vital Signs: Temp: 98 F (36.7 C) (03/16 2131) Temp Source: Oral (03/16 2131) BP: 137/97 (03/16 2131) Pulse Rate: 94 (03/16 2131)  Labs: Recent Labs    01/20/21 1129 01/20/21 1412 01/20/21 2157  HGB 12.9*  --   --   HCT 39.8  --   --   PLT 209  --   --   APTT  --  <24*  --   LABPROT  --  15.1  --   INR  --  1.2  --   HEPARINUNFRC  --   --  <0.10*  CREATININE 1.28*  --   --     Estimated Creatinine Clearance: 50.7 mL/min (A) (by C-G formula based on SCr of 1.28 mg/dL (H)).   Medical History: Past Medical History:  Diagnosis Date  . Dementia ([place])   . Dementia ([place])   . Hyperlipidemia   . Hypertension   . Incontinence     Medications:  Medications Prior to Admission  Medication Sig Dispense Refill Last Dose  . amLODipine (NORVASC) 5 MG tablet Take 1 tablet (5 mg total) by mouth daily. (Patient not taking: No sig reported) 90 tablet 0 Not Taking at Unknown time  . polyethylene glycol powder (GLYCOLAX/MIRALAX) 17 GM/SCOOP powder Take 17 g by mouth daily. (Patient not taking: No sig reported) 500 g 0 Not Taking at Unknown time  . tamsulosin (FLOMAX) 0.4 MG CAPS capsule TAKE 1 CAPSULE BY MOUTH EVERY DAY (Patient not taking: No sig reported) 30 capsule 0 Not Taking at Unknown time   Scheduled:  Infusions:  PRN:  Anti-infectives (From admission, onward)   None      Assessment: Pharmacy consulted to started started on heparin for new DVT. No DOAC PTA. CBC stable.   Goal of Therapy:  Heparin level 0.3-0.7 units/ml Monitor platelets by anticoagulation protocol: Yes   Plan:  3/16:  HL @ 2157 = < 0.1  Will order Heparin 2200 units IV X 1 bolus and increase drip rate to 1500 units/hr.  Will recheck HL 8 hrs after rate change.    Ellia Knowlton D, PharmD 01/20/2021,11:42 PM

## 2021-01-20 NOTE — Consult Note (Signed)
ANTICOAGULATION CONSULT NOTE  Pharmacy Consult for Heparin  Indication: DVT  No Known Allergies  Patient Measurements: Height: 5\' 10"  (177.8 cm) Weight: 74.8 kg (165 lb) IBW/kg (Calculated) : 73 Heparin Dosing Weight: 74.8 kg  Vital Signs: BP: 146/96 (03/16 1200) Pulse Rate: 98 (03/16 1200)  Labs: Recent Labs    01/20/21 1129  HGB 12.9*  HCT 39.8  PLT 209  CREATININE 1.28*    Estimated Creatinine Clearance: 50.7 mL/min (A) (by C-G formula based on SCr of 1.28 mg/dL (H)).   Medical History: Past Medical History:  Diagnosis Date  . Dementia ([place])   . Hyperlipidemia   . Hypertension   . Incontinence     Medications:  (Not in a hospital admission)  Scheduled:  Infusions:  PRN:  Anti-infectives (From admission, onward)   None      Assessment: Pharmacy consulted to started started on heparin for new DVT. No DOAC PTA. CBC stable.   Goal of Therapy:  Heparin level 0.3-0.7 units/ml Monitor platelets by anticoagulation protocol: Yes   Plan:  Give 4500 units bolus x 1 Start heparin infusion at 1200 units/hr Check anti-Xa level in 8 hours and daily while on heparin Continue to monitor H&H and platelets  Oswald Hillock, PharmD, BCPS 01/20/2021,1:10 PM

## 2021-01-20 NOTE — H&P (View-Only) (Signed)
[facility] Consult Note  MRN : [id number]  Ronnie Bullock is a 75 y.o. (1946/03/29) male who presents with chief complaint of  Chief Complaint  Patient presents with  . Groin Swelling  . Leg Swelling   History of Present Illness:  The patient is a 75 year old male with a past medical history of dementia, hyperlipidemia, hypertension, prostate cancer, COVID-19 (11/19/20) who presented to the [facility] Emergency Department complaining of progressively worsening swelling and pain to the left lower extremity.   History for this consult was obtained through speaking to the patient and his family member at the bedside.  Patient denies any recent surgery or trauma, immobility, known clotting disorder however was Covid positive approximately one month ago.  Venous duplex completed on January 20, 2021 was notable for deep venous thrombosis in the left lower extremity. Thrombus involves the distal left common femoral vein, left femoral vein, left popliteal vein and left deep calf veins. Occlusive thrombus involving the left femoral vein, left popliteal vein and left deep calf veins.  Patient denies any fever, nausea vomiting.  Denies any shortness of breath or chest pain.  Vascular surgery was consulted by Dr. Quentin Cornwall for possible endovascular intervention.  Current Facility-Administered Medications  Medication Dose Route Frequency Provider Last Rate Last Admin  . heparin ADULT infusion 100 units/mL (25000 units/289mL)  1,200 Units/hr Intravenous Continuous Oswald Hillock, RPH      . heparin bolus via infusion 4,500 Units  4,500 Units Intravenous Once Oswald Hillock, [facility]       Current Outpatient Medications  Medication Sig Dispense Refill  . amLODipine (NORVASC) 5 MG tablet Take 1 tablet (5 mg total) by mouth daily. (Patient not taking: No sig reported) 90 tablet 0  . polyethylene glycol powder (GLYCOLAX/MIRALAX) 17 GM/SCOOP powder Take  17 g by mouth daily. (Patient not taking: No sig reported) 500 g 0  . tamsulosin (FLOMAX) 0.4 MG CAPS capsule TAKE 1 CAPSULE BY MOUTH EVERY DAY (Patient not taking: No sig reported) 30 capsule 0   Past Medical History:  Diagnosis Date  . Dementia ([place])   . Hyperlipidemia   . Hypertension   . Incontinence    Past Surgical History:  Procedure Laterality Date  . COLONOSCOPY WITH PROPOFOL N/A 05/16/2018   Procedure: COLONOSCOPY WITH PROPOFOL;  Surgeon: Virgel Manifold, MD;  Location: [facility] ENDOSCOPY;  Service: Endoscopy;  Laterality: N/A;   Social History Social History   Tobacco Use  . Smoking status: Never Smoker  . Smokeless tobacco: Never Used  Vaping Use  . Vaping Use: Never used  Substance Use Topics  . Alcohol use: No    Alcohol/week: 0.0 standard drinks  . Drug use: No   Family History Family History  Problem Relation Age of Onset  . Hypertension Mother   . Prostate cancer Neg Hx   . Bladder Cancer Neg Hx   . Kidney cancer Neg Hx   Denies family history of peripheral artery disease, venous disease or renal disease.  No Known Allergies  REVIEW OF SYSTEMS (Negative unless checked)  Constitutional: [] Weight loss  [] Fever  [] Chills Cardiac: [] Chest pain   [] Chest pressure   [] Palpitations   [] Shortness of breath when laying flat   [] Shortness of breath at rest   [] Shortness of breath with exertion. Vascular:  [] Pain in legs with walking   [] Pain in legs at rest   [] Pain in legs when laying flat   [] Claudication   [] Pain in feet  when walking  [] Pain in feet at rest  [] Pain in feet when laying flat   [] History of DVT   [] Phlebitis   [x] Swelling in legs   [] Varicose veins   [] Non-healing ulcers Pulmonary:   [] Uses home oxygen   [] Productive cough   [] Hemoptysis   [] Wheeze  [] COPD   [] Asthma Neurologic:  [] Dizziness  [] Blackouts   [] Seizures   [] History of stroke   [] History of TIA  [] Aphasia   [] Temporary blindness   [] Dysphagia   [] Weakness or numbness in arms    [] Weakness or numbness in legs Musculoskeletal:  [x] Arthritis   [] Joint swelling   [] Joint pain   [] Low back pain Hematologic:  [] Easy bruising  [] Easy bleeding   [] Hypercoagulable state   [x] Anemic  [] Hepatitis Gastrointestinal:  [] Blood in stool   [] Vomiting blood  [] Gastroesophageal reflux/heartburn   [] Difficulty swallowing. Genitourinary:  [x] Chronic kidney disease   [] Difficult urination  [] Frequent urination  [] Burning with urination   [] Blood in urine Skin:  [] Rashes   [] Ulcers   [] Wounds Psychological:  [] History of anxiety   []  History of major depression.  Physical Examination  Vitals:   01/20/21 1036 01/20/21 1200 01/20/21 1230 01/20/21 1300  BP:  (!) 146/96 (!) 153/107 (!) 149/101  Pulse:  98 95 90  Resp:  18 19 20   SpO2:  100% (!) 47% 96%  Weight: 74.8 kg     Height: 5\' 10"  (1.778 m)      Body mass index is 23.68 kg/m. Gen:  WD/WN, NAD Head: [place]/AT, No temporalis wasting. Prominent temp pulse not noted. Ear/Nose/Throat: Hearing grossly intact, nares w/o erythema or drainage, oropharynx w/o Erythema/Exudate Eyes: Sclera non-icteric, conjunctiva clear Neck: Trachea midline.  No JVD.  Pulmonary:  Good air movement, respirations not labored, equal bilaterally.  Cardiac: RRR, normal S1, S2. Vascular:  Vessel Right Left  Radial Palpable Palpable  Ulnar Palpable Palpable  Brachial Palpable Palpable  Carotid Palpable, without bruit Palpable, without bruit  Aorta Not palpable N/A  Femoral Palpable Palpable  Popliteal Palpable Palpable  PT Palpable Palpable  DP Palpable Palpable   Left lower extremity: Thigh soft. Calf edematous. Extremities warm distally toes. Dopplerable pedal pulses. There is no acute vascular compromise noted to extremity at this time. Good capillary refill.  Motor / sensory is intact.  Gastrointestinal: soft, non-tender/non-distended. No guarding/reflex.  Musculoskeletal: M/S 5/5 throughout.  Extremities without ischemic changes.  No deformity or  atrophy. No edema. Neurologic: Sensation grossly intact in extremities.  Symmetrical.  Speech is fluent. Motor exam as listed above. Psychiatric: Judgment intact, Mood & affect appropriate for pt's clinical situation. Dermatologic: No rashes or ulcers noted.  No cellulitis or open wounds. Lymph : No Cervical, Axillary, or Inguinal lymphadenopathy.  CBC Lab Results  Component Value Date   WBC 9.8 01/20/2021   HGB 12.9 (L) 01/20/2021   HCT 39.8 01/20/2021   MCV 91.9 01/20/2021   PLT 209 01/20/2021   BMET    Component Value Date/Time   NA 138 01/20/2021 1129   NA 139 09/10/2020 1441   K 4.4 01/20/2021 1129   CL 102 01/20/2021 1129   CO2 28 01/20/2021 1129   GLUCOSE 124 (H) 01/20/2021 1129   BUN 18 01/20/2021 1129   BUN 16 09/10/2020 1441   CREATININE 1.28 (H) 01/20/2021 1129   CALCIUM 8.7 (L) 01/20/2021 1129   GFRNONAA 58 (L) 01/20/2021 1129   GFRAA 84 09/10/2020 1441   Estimated Creatinine Clearance: 50.7 mL/min (A) (by C-G formula based on SCr of 1.28  mg/dL (H)).  COAG Lab Results  Component Value Date   INR 1.0 12/02/2020   Radiology US Venous Img Lower Unilateral Left  Result Date: 01/20/2021 CLINICAL DATA:  75 year old with left leg swelling. EXAM: LEFT LOWER EXTREMITY VENOUS DOPPLER ULTRASOUND TECHNIQUE: Gray-scale sonography with graded compression, as well as color Doppler and duplex ultrasound were performed to evaluate the lower extremity deep venous systems from the level of the common femoral vein and including the common femoral, femoral, profunda femoral, popliteal and calf veins including the posterior tibial, peroneal and gastrocnemius veins when visible. The superficial great saphenous vein was also interrogated. Spectral Doppler was utilized to evaluate flow at rest and with distal augmentation maneuvers in the common femoral, femoral and popliteal veins. COMPARISON:  None. FINDINGS: Contralateral Common Femoral Vein: No thrombus. Normal compressibility and  color Doppler flow. Common Femoral Vein: Partial compressibility of the left great common femoral vein near the saphenofemoral junction with nonocclusive thrombus in the left common femoral vein. Saphenofemoral Junction: Patent Profunda Femoral Vein: Positive for echogenic thrombus. Femoral Vein: Positive for thrombus. Echogenic thrombus in the left femoral vein without significant flow. Popliteal Vein: Positive for echogenic thrombus. Evidence for occlusive thrombus. Calf Veins: Positive for thrombus. No significant flow identified in the left posterior tibial or left peroneal veins. Other Findings:  None. IMPRESSION: Positive for deep venous thrombosis in the left lower extremity. Thrombus involves the distal left common femoral vein, left femoral vein, left popliteal vein and left deep calf veins. Occlusive thrombus involving the left femoral vein, left popliteal vein and left deep calf veins. These results were called by telephone at the time of interpretation on 01/20/2021 at 12:13 pm to provider Merlyn Lot , who verbally acknowledged these results. Electronically Signed   By: Markus Daft M.D.   On: 01/20/2021 12:15   DG Chest Portable 1 View  Result Date: 01/20/2021 CLINICAL DATA:  Left lower extremity swelling. EXAM: PORTABLE CHEST 1 VIEW COMPARISON:  None. FINDINGS: The heart size and mediastinal contours are within normal limits. Both lungs are clear. The visualized skeletal structures are unremarkable. IMPRESSION: No active disease. Electronically Signed   By: Marijo Conception M.D.   On: 01/20/2021 11:56   Assessment/Plan The patient is a 75 year old male with a past medical history of dementia, hyperlipidemia, hypertension, prostate cancer, COVID-19 (11/19/20) who presented to the [facility] Emergency Department complaining of progressively worsening swelling and pain to the left lower extremity.   1.  Left Lower Extremity DVT: Patient presented to the [facility] emergency department with progressively worsening left lower extremity edema and discomfort.  Venous duplex was notable for extensive left lower extremity DVT (deep venous thrombosis in the left lower extremity. Thrombus involves the distal left common femoral vein, left femoral vein, left popliteal vein and left deep calf veins. Occlusive thrombus involving the left femoral vein, left popliteal vein and left deep calf veins).  In the setting of extensive left lower extremity DVT with progressively worsening pain and swelling (no acute vascular compromise noted at the time of this examination) recommend undergoing a venous thrombolysis/thrombectomy in an attempt to improve the patient's symptoms, reduce the clot burden decrease the chance of postphlebitic complications. Patient denies any recent surgery or trauma, immobility, known clotting disorder however was Covid positive approximately one month ago. Procedure, risks and benefits were explained to the patient and his family member at the bedside.  All questions were answered.  Patient wished to proceed.  We will  plan on this Friday with Dr. Delana Meyer.  Agree with the initiation of heparin.  Will transition to p.o. anticoagulation status post procedure.  2.  Dementia: Patient with known history of dementia. Interacts appropriately however is a poor historian. A family member was present at the bedside during this consult and actively participated in the patient's decision-making process.  3.  Hypertension: On appropriate medications Encouraged good control as its slows the progression of atherosclerotic disease  Discussed with Dr. Francene Castle, PA-C  01/20/2021 1:27 PM  This note was created with Dragon medical transcription system.  Any error is purely unintentional

## 2021-01-20 NOTE — ED Notes (Signed)
Pedal pulses found with doppler. Pt has groin swelling and left leg swelling. Pt has dementia. Son at bedside. Son states he's unsure when it started. Pt had urology appointment this morning but when son saw groin swelling he came here instead. Pt denies pain to the leg but pitting edema present.

## 2021-01-20 NOTE — ED Notes (Signed)
Surgery PA at bedside.  

## 2021-01-20 NOTE — ED Notes (Signed)
Pt/pts son  wants code name to be " [email or web address]"

## 2021-01-20 NOTE — ED Notes (Signed)
US at bedside

## 2021-01-20 NOTE — ED Triage Notes (Signed)
Per pt brother who lives with him, states over night the pt has edema/swelling of the groin area and left leg with pain, unable to palpate pedal pulse on arrival

## 2021-01-20 NOTE — ED Notes (Signed)
Resumed care from russ rn.  Pt alert.  Iv meds infusing.  Denies pain.  No sob.  Pt waiting on admission

## 2021-01-20 NOTE — Consult Note (Signed)
Ronnie Bullock  MRN : [id number]  Ronnie Bullock is a 75 y.o. ([birth year]) male who presents with chief complaint of  Chief Complaint  Patient presents with  . Groin Swelling  . Leg Swelling   History of Present Illness:  The patient is a 75 year old male with a past medical history of dementia, hyperlipidemia, hypertension, prostate cancer, COVID-19 (11/19/20) who presented to the [facility] Emergency Department complaining of progressively worsening swelling and pain to the left lower extremity.   History for this consult was obtained through speaking to the patient and his family member at the bedside.  Patient denies any recent surgery or trauma, immobility, known clotting disorder however was Covid positive approximately one month ago.  Venous duplex completed on January 20, 2021 was notable for deep venous thrombosis in the left lower extremity. Thrombus involves the distal left common femoral vein, left femoral vein, left popliteal vein and left deep calf veins. Occlusive thrombus involving the left femoral vein, left popliteal vein and left deep calf veins.  Patient denies any fever, nausea vomiting.  Denies any shortness of breath or chest pain.  Vascular surgery was consulted by Dr. Quentin Cornwall for possible endovascular intervention.  Current Facility-Administered Medications  Medication Dose Route Frequency Provider Last Rate Last Admin  . heparin ADULT infusion 100 units/mL (25000 units/253mL)  1,200 Units/hr Intravenous Continuous Oswald Hillock, RPH      . heparin bolus via infusion 4,500 Units  4,500 Units Intravenous Once Oswald Hillock, [facility]       Current Outpatient Medications  Medication Sig Dispense Refill  . amLODipine (NORVASC) 5 MG tablet Take 1 tablet (5 mg total) by mouth daily. (Patient not taking: No sig reported) 90 tablet 0  . polyethylene glycol powder (GLYCOLAX/MIRALAX) 17 GM/SCOOP powder Take  17 g by mouth daily. (Patient not taking: No sig reported) 500 g 0  . tamsulosin (FLOMAX) 0.4 MG CAPS capsule TAKE 1 CAPSULE BY MOUTH EVERY DAY (Patient not taking: No sig reported) 30 capsule 0   Past Medical History:  Diagnosis Date  . Dementia ([place])   . Hyperlipidemia   . Hypertension   . Incontinence    Past Surgical History:  Procedure Laterality Date  . COLONOSCOPY WITH PROPOFOL N/A 05/16/2018   Procedure: COLONOSCOPY WITH PROPOFOL;  Surgeon: Virgel Manifold, MD;  Location: [facility] ENDOSCOPY;  Service: Endoscopy;  Laterality: N/A;   Social History Social History   Tobacco Use  . Smoking status: Never Smoker  . Smokeless tobacco: Never Used  Vaping Use  . Vaping Use: Never used  Substance Use Topics  . Alcohol use: No    Alcohol/week: 0.0 standard drinks  . Drug use: No   Family History Family History  Problem Relation Age of Onset  . Hypertension Mother   . Prostate cancer Neg Hx   . Bladder Cancer Neg Hx   . Kidney cancer Neg Hx   Denies family history of peripheral artery disease, venous disease or renal disease.  No Known Allergies  REVIEW OF SYSTEMS (Negative unless checked)  Constitutional: [] Weight loss  [] Fever  [] Chills Cardiac: [] Chest pain   [] Chest pressure   [] Palpitations   [] Shortness of breath when laying flat   [] Shortness of breath at rest   [] Shortness of breath with exertion. Vascular:  [] Pain in legs with walking   [] Pain in legs at rest   [] Pain in legs when laying flat   [] Claudication   [] Pain in feet  when walking  [] Pain in feet at rest  [] Pain in feet when laying flat   [] History of DVT   [] Phlebitis   [x] Swelling in legs   [] Varicose veins   [] Non-healing ulcers Pulmonary:   [] Uses home oxygen   [] Productive cough   [] Hemoptysis   [] Wheeze  [] COPD   [] Asthma Neurologic:  [] Dizziness  [] Blackouts   [] Seizures   [] History of stroke   [] History of TIA  [] Aphasia   [] Temporary blindness   [] Dysphagia   [] Weakness or numbness in arms    [] Weakness or numbness in legs Musculoskeletal:  [x] Arthritis   [] Joint swelling   [] Joint pain   [] Low back pain Hematologic:  [] Easy bruising  [] Easy bleeding   [] Hypercoagulable state   [x] Anemic  [] Hepatitis Gastrointestinal:  [] Blood in stool   [] Vomiting blood  [] Gastroesophageal reflux/heartburn   [] Difficulty swallowing. Genitourinary:  [x] Chronic kidney disease   [] Difficult urination  [] Frequent urination  [] Burning with urination   [] Blood in urine Skin:  [] Rashes   [] Ulcers   [] Wounds Psychological:  [] History of anxiety   []  History of major depression.  Physical Examination  Vitals:   01/20/21 1036 01/20/21 1200 01/20/21 1230 01/20/21 1300  BP:  (!) 146/96 (!) 153/107 (!) 149/101  Pulse:  98 95 90  Resp:  18 19 20   SpO2:  100% (!) 47% 96%  Weight: 74.8 kg     Height: 5\' 10"  (1.778 m)      Body mass index is 23.68 kg/m. Gen:  WD/WN, NAD Head: [place]/AT, No temporalis wasting. Prominent temp pulse not noted. Ear/Nose/Throat: Hearing grossly intact, nares w/o erythema or drainage, oropharynx w/o Erythema/Exudate Eyes: Sclera non-icteric, conjunctiva clear Neck: Trachea midline.  No JVD.  Pulmonary:  Good air movement, respirations not labored, equal bilaterally.  Cardiac: RRR, normal S1, S2. Vascular:  Vessel Right Left  Radial Palpable Palpable  Ulnar Palpable Palpable  Brachial Palpable Palpable  Carotid Palpable, without bruit Palpable, without bruit  Aorta Not palpable N/A  Femoral Palpable Palpable  Popliteal Palpable Palpable  PT Palpable Palpable  DP Palpable Palpable   Left lower extremity: Thigh soft. Calf edematous. Extremities warm distally toes. Dopplerable pedal pulses. There is no acute vascular compromise noted to extremity at this time. Good capillary refill.  Motor / sensory is intact.  Gastrointestinal: soft, non-tender/non-distended. No guarding/reflex.  Musculoskeletal: M/S 5/5 throughout.  Extremities without ischemic changes.  No deformity or  atrophy. No edema. Neurologic: Sensation grossly intact in extremities.  Symmetrical.  Speech is fluent. Motor exam as listed above. Psychiatric: Judgment intact, Mood & affect appropriate for pt's clinical situation. Dermatologic: No rashes or ulcers noted.  No cellulitis or open wounds. Lymph : No Cervical, Axillary, or Inguinal lymphadenopathy.  CBC Lab Results  Component Value Date   WBC 9.8 01/20/2021   HGB 12.9 (L) 01/20/2021   HCT 39.8 01/20/2021   MCV 91.9 01/20/2021   PLT 209 01/20/2021   BMET    Component Value Date/Time   NA 138 01/20/2021 1129   NA 139 09/10/2020 1441   K 4.4 01/20/2021 1129   CL 102 01/20/2021 1129   CO2 28 01/20/2021 1129   GLUCOSE 124 (H) 01/20/2021 1129   BUN 18 01/20/2021 1129   BUN 16 09/10/2020 1441   CREATININE 1.28 (H) 01/20/2021 1129   CALCIUM 8.7 (L) 01/20/2021 1129   GFRNONAA 58 (L) 01/20/2021 1129   GFRAA 84 09/10/2020 1441   Estimated Creatinine Clearance: 50.7 mL/min (A) (by C-G formula based on SCr of 1.28  mg/dL (H)).  COAG Lab Results  Component Value Date   INR 1.0 12/02/2020   Radiology US Venous Img Lower Unilateral Left  Result Date: 01/20/2021 CLINICAL DATA:  75 year old with left leg swelling. EXAM: LEFT LOWER EXTREMITY VENOUS DOPPLER ULTRASOUND TECHNIQUE: Gray-scale sonography with graded compression, as well as color Doppler and duplex ultrasound were performed to evaluate the lower extremity deep venous systems from the level of the common femoral vein and including the common femoral, femoral, profunda femoral, popliteal and calf veins including the posterior tibial, peroneal and gastrocnemius veins when visible. The superficial great saphenous vein was also interrogated. Spectral Doppler was utilized to evaluate flow at rest and with distal augmentation maneuvers in the common femoral, femoral and popliteal veins. COMPARISON:  None. FINDINGS: Contralateral Common Femoral Vein: No thrombus. Normal compressibility and  color Doppler flow. Common Femoral Vein: Partial compressibility of the left great common femoral vein near the saphenofemoral junction with nonocclusive thrombus in the left common femoral vein. Saphenofemoral Junction: Patent Profunda Femoral Vein: Positive for echogenic thrombus. Femoral Vein: Positive for thrombus. Echogenic thrombus in the left femoral vein without significant flow. Popliteal Vein: Positive for echogenic thrombus. Evidence for occlusive thrombus. Calf Veins: Positive for thrombus. No significant flow identified in the left posterior tibial or left peroneal veins. Other Findings:  None. IMPRESSION: Positive for deep venous thrombosis in the left lower extremity. Thrombus involves the distal left common femoral vein, left femoral vein, left popliteal vein and left deep calf veins. Occlusive thrombus involving the left femoral vein, left popliteal vein and left deep calf veins. These results were called by telephone at the time of interpretation on 01/20/2021 at 12:13 pm to provider Merlyn Lot , who verbally acknowledged these results. Electronically Signed   By: Markus Daft M.D.   On: 01/20/2021 12:15   DG Chest Portable 1 View  Result Date: 01/20/2021 CLINICAL DATA:  Left lower extremity swelling. EXAM: PORTABLE CHEST 1 VIEW COMPARISON:  None. FINDINGS: The heart size and mediastinal contours are within normal limits. Both lungs are clear. The visualized skeletal structures are unremarkable. IMPRESSION: No active disease. Electronically Signed   By: Marijo Conception M.D.   On: 01/20/2021 11:56   Assessment/Plan The patient is a 75 year old male with a past medical history of dementia, hyperlipidemia, hypertension, prostate cancer, COVID-19 (11/19/20) who presented to the [facility] Emergency Department complaining of progressively worsening swelling and pain to the left lower extremity.   1.  Left Lower Extremity DVT: Patient presented to the [facility] emergency department with progressively worsening left lower extremity edema and discomfort.  Venous duplex was notable for extensive left lower extremity DVT (deep venous thrombosis in the left lower extremity. Thrombus involves the distal left common femoral vein, left femoral vein, left popliteal vein and left deep calf veins. Occlusive thrombus involving the left femoral vein, left popliteal vein and left deep calf veins).  In the setting of extensive left lower extremity DVT with progressively worsening pain and swelling (no acute vascular compromise noted at the time of this examination) recommend undergoing a venous thrombolysis/thrombectomy in an attempt to improve the patient's symptoms, reduce the clot burden decrease the chance of postphlebitic complications. Patient denies any recent surgery or trauma, immobility, known clotting disorder however was Covid positive approximately one month ago. Procedure, risks and benefits were explained to the patient and his family member at the bedside.  All questions were answered.  Patient wished to proceed.  We will  plan on this Friday with Dr. Delana Meyer.  Agree with the initiation of heparin.  Will transition to p.o. anticoagulation status post procedure.  2.  Dementia: Patient with known history of dementia. Interacts appropriately however is a poor historian. A family member was present at the bedside during this consult and actively participated in the patient's decision-making process.  3.  Hypertension: On appropriate medications Encouraged good control as its slows the progression of atherosclerotic disease  Discussed with Dr. Francene Castle, PA-C  01/20/2021 1:27 PM  This Bullock was created with Dragon medical transcription system.  Any error is purely unintentional

## 2021-01-20 NOTE — H&P (Signed)
History and Physical  Ronnie Bullock DGL:[id number] DOB: Aug 21, 1946 DOA: 01/20/2021  Referring physician: Dr. Quentin Cornwall, [place] PCP: [name], DO  Outpatient Specialists: Urology, gastroenterology, cardiology, oncology. Patient coming from: Home.  Chief Complaint: Left lower extremity swelling.  HPI: Ronnie Bullock is a 75 y.o. male with medical history significant for prostate cancer, dementia, essential hypertension, hyperlipidemia, who presented from home to [facility] ED with complaints of left lower extremity swelling.  Patient has dementia and history is mainly obtained from his brother whom he lives with at bedside.  Per his brother he has been having complaints of discomfort in his left lower extremity for the past month since he moved in with him and last night he noted significant swelling associated with tenderness to touch.  Patient has been living a sedentary lifestyle.  No reported fever or cardiopulmonary symptoms.  Due to swelling and pain patient was brought into the ED for further evaluation.  Work-up in the ED revealed extensive left lower extremity DVT on venous duplex ultrasound.  Vascular surgery was consulted by EDP, possible thrombectomy on Friday, 01/22/2021.  Recommended to start heparin drip.  Pharmacy consulted for dosing adjustment.    ED Course: Afebrile, BP 149/401, heart rate 90, respiratory rate 16, respiratory 96% on room air.  Lab studies remarkable for creatinine 1.28 with baseline of 1.1, total bilirubin 1.6.  Hemoglobin 5.9 with baseline of 13.5.  Review of Systems: Review of systems as noted in the HPI. All other systems reviewed and are negative.   Past Medical History:  Diagnosis Date  . Dementia ([place])   . Hyperlipidemia   . Hypertension   . Incontinence    Past Surgical History:  Procedure Laterality Date  . COLONOSCOPY WITH PROPOFOL N/A 05/16/2018   Procedure: COLONOSCOPY WITH PROPOFOL;  Surgeon: Virgel Manifold, MD;  Location: [facility]  ENDOSCOPY;  Service: Endoscopy;  Laterality: N/A;    Social History:  reports that he has never smoked. He has never used smokeless tobacco. He reports that he does not drink alcohol and does not use drugs.   No Known Allergies  Family History  Problem Relation Age of Onset  . Hypertension Mother   . Prostate cancer Neg Hx   . Bladder Cancer Neg Hx   . Kidney cancer Neg Hx       Prior to Admission medications   Medication Sig Start Date End Date Taking? Authorizing Provider  amLODipine (NORVASC) 5 MG tablet Take 1 tablet (5 mg total) by mouth daily. Patient not taking: No sig reported 11/03/20 02/01/21  Myles Gip, DO  polyethylene glycol powder (GLYCOLAX/MIRALAX) 17 GM/SCOOP powder Take 17 g by mouth daily. Patient not taking: No sig reported 09/23/20   Myles Gip, DO  tamsulosin (FLOMAX) 0.4 MG CAPS capsule TAKE 1 CAPSULE BY MOUTH EVERY DAY Patient not taking: No sig reported 12/07/20   Abbie Sons, MD    Physical Exam: BP (!) 149/101   Pulse 90   Resp 20   Ht 5\' 10"  (1.778 m)   Wt 74.8 kg   SpO2 96%   BMI 23.68 kg/m   . General: 75 y.o. year-old male well developed well nourished in no acute distress.  Alert and interactive with baseline dementia. . Cardiovascular: Regular rate and rhythm with no rubs or gallops.  No thyromegaly or JVD noted.  Left lower extremity 3+ pitting edema.  Marland Kitchen  Respiratory: Clear to auscultation with no wheezes or rales. Good inspiratory effort. . Abdomen: Soft nontender  nondistended with normal bowel sounds x4 quadrants. . Muskuloskeletal: No cyanosis or clubbing.  Left lower extremity 3+ pitting edema with tenderness on palpation.   . Neuro: CN II-XII intact, strength, sensation, reflexes . Skin: No ulcerative lesions noted or rashes . Psychiatry: Judgement and insight appear altered in the setting of dementia. Mood is appropriate for condition and setting          Labs on Admission:  Basic Metabolic Panel: Recent Labs   Lab 01/20/21 1129  NA 138  K 4.4  CL 102  CO2 28  GLUCOSE 124*  BUN 18  CREATININE 1.28*  CALCIUM 8.7*   Liver Function Tests: Recent Labs  Lab 01/20/21 1129  AST 23  ALT 12  ALKPHOS 67  BILITOT 1.6*  PROT 7.4  ALBUMIN 2.6*   No results for input(s): LIPASE, AMYLASE in the last 168 hours. No results for input(s): AMMONIA in the last 168 hours. CBC: Recent Labs  Lab 01/20/21 1129  WBC 9.8  NEUTROABS 6.7  HGB 12.9*  HCT 39.8  MCV 91.9  PLT 209   Cardiac Enzymes: No results for input(s): CKTOTAL, CKMB, CKMBINDEX, TROPONINI in the last 168 hours.  BNP (last 3 results) No results for input(s): BNP in the last 8760 hours.  ProBNP (last 3 results) No results for input(s): PROBNP in the last 8760 hours.  CBG: No results for input(s): GLUCAP in the last 168 hours.  Radiological Exams on Admission: US Venous Img Lower Unilateral Left  Result Date: 01/20/2021 CLINICAL DATA:  75 year old with left leg swelling. EXAM: LEFT LOWER EXTREMITY VENOUS DOPPLER ULTRASOUND TECHNIQUE: Gray-scale sonography with graded compression, as well as color Doppler and duplex ultrasound were performed to evaluate the lower extremity deep venous systems from the level of the common femoral vein and including the common femoral, femoral, profunda femoral, popliteal and calf veins including the posterior tibial, peroneal and gastrocnemius veins when visible. The superficial great saphenous vein was also interrogated. Spectral Doppler was utilized to evaluate flow at rest and with distal augmentation maneuvers in the common femoral, femoral and popliteal veins. COMPARISON:  None. FINDINGS: Contralateral Common Femoral Vein: No thrombus. Normal compressibility and color Doppler flow. Common Femoral Vein: Partial compressibility of the left great common femoral vein near the saphenofemoral junction with nonocclusive thrombus in the left common femoral vein. Saphenofemoral Junction: Patent Profunda  Femoral Vein: Positive for echogenic thrombus. Femoral Vein: Positive for thrombus. Echogenic thrombus in the left femoral vein without significant flow. Popliteal Vein: Positive for echogenic thrombus. Evidence for occlusive thrombus. Calf Veins: Positive for thrombus. No significant flow identified in the left posterior tibial or left peroneal veins. Other Findings:  None. IMPRESSION: Positive for deep venous thrombosis in the left lower extremity. Thrombus involves the distal left common femoral vein, left femoral vein, left popliteal vein and left deep calf veins. Occlusive thrombus involving the left femoral vein, left popliteal vein and left deep calf veins. These results were called by telephone at the time of interpretation on 01/20/2021 at 12:13 pm to provider Merlyn Lot , who verbally acknowledged these results. Electronically Signed   By: Markus Daft M.D.   On: 01/20/2021 12:15   DG Chest Portable 1 View  Result Date: 01/20/2021 CLINICAL DATA:  Left lower extremity swelling. EXAM: PORTABLE CHEST 1 VIEW COMPARISON:  None. FINDINGS: The heart size and mediastinal contours are within normal limits. Both lungs are clear. The visualized skeletal structures are unremarkable. IMPRESSION: No active disease. Electronically Signed   By: Jeneen Rinks  Murlean Caller M.D.   On: 01/20/2021 11:56    EKG: I independently viewed the EKG done and my findings are as followed: Sinus arrhythmia with a rate of 97.  Nonspecific ST-T changes.  Assessment/Plan Present on Admission: . DVT (deep venous thrombosis) (HCC)  Active Problems:   DVT (deep venous thrombosis) (HCC)  Extensive left lower extremity DVT seen on venous ultrasound. Presented with acute left lower extremity edema with tenderness. Sedentary lifestyle.  History of prostate cancer. Work-up in the ED revealed left lower extremity extensive DVT. Vascular surgery was consulted by EDP, likely thrombectomy on Friday, 01/22/2021. Recommended heparin drip for  now. Pharmacy consulted for dosing adjustment. Consider switching to Eliquis when okay with vascular surgery.  Isolated elevated bilirubin, unclear etiology Presented with T bili of 1.6 Alkaline phosphatase, AST and ALT normal Monitor for now and repeat CMP in the morning.  Essential hypertension BP is not at goal Resume home medications Add as needed IV antihypertensive. Closely monitor vital signs.  CKD 2 Baseline creatinine appears to be 1.1 with GFR greater than 60. Presented with creatinine of 1.2 with GFR 58. Avoid nephrotoxic agents and dehydration. Monitor urine output Repeat renal panel in the morning.  History of prostate cancer Follows with urology.  Physical debility PT OT to assess Fall precautions.   DVT prophylaxis: Heparin drip.  Code Status: Full code as stated by his brother at bedside.  Family Communication: Daughter at bedside.  Disposition Plan: Admit to stepdown unit.  Consults called: Vascular surgery consulted by EDP.  Admission status: Inpatient status.  Patient will require at least 2 midnights for further evaluation and treatment of present condition.   Status is: Inpatient    Dispo:  Patient From: Home  Planned Disposition: Home with Health Care Svc  Anticipated discharge date 01/23/2021.  Medically stable for discharge: No, ongoing management of extensive left lower extremity DVT, currently on heparin drip.         Kayleen Memos MD [facility] Pager [phone]  If 7PM-7AM, please contact night-coverage [email or web address] Password [email or web address]  01/20/2021, 1:36 PM

## 2021-01-21 ENCOUNTER — Encounter: Payer: Self-pay | Admitting: Internal Medicine

## 2021-01-21 ENCOUNTER — Other Ambulatory Visit (INDEPENDENT_AMBULATORY_CARE_PROVIDER_SITE_OTHER): Payer: Self-pay | Admitting: Vascular Surgery

## 2021-01-21 ENCOUNTER — Inpatient Hospital Stay: Payer: Medicare HMO

## 2021-01-21 DIAGNOSIS — N400 Enlarged prostate without lower urinary tract symptoms: Secondary | ICD-10-CM

## 2021-01-21 DIAGNOSIS — I10 Essential (primary) hypertension: Secondary | ICD-10-CM

## 2021-01-21 DIAGNOSIS — C61 Malignant neoplasm of prostate: Secondary | ICD-10-CM

## 2021-01-21 DIAGNOSIS — F0390 Unspecified dementia without behavioral disturbance: Secondary | ICD-10-CM

## 2021-01-21 DIAGNOSIS — R000 Tachycardia, unspecified: Secondary | ICD-10-CM

## 2021-01-21 HISTORY — DX: Unspecified dementia, unspecified severity, without behavioral disturbance, psychotic disturbance, mood disturbance, and anxiety: F03.90

## 2021-01-21 LAB — CBC
HCT: 37.1 % — ABNORMAL LOW (ref 39.0–52.0)
Hemoglobin: 12.3 g/dL — ABNORMAL LOW (ref 13.0–17.0)
MCH: 30 pg (ref 26.0–34.0)
MCHC: 33.2 g/dL (ref 30.0–36.0)
MCV: 90.5 fL (ref 80.0–100.0)
Platelets: 220 10*3/uL (ref 150–400)
RBC: 4.1 MIL/uL — ABNORMAL LOW (ref 4.22–5.81)
RDW: 14 % (ref 11.5–15.5)
WBC: 9.2 10*3/uL (ref 4.0–10.5)
nRBC: 0 % (ref 0.0–0.2)

## 2021-01-21 LAB — COMPREHENSIVE METABOLIC PANEL
ALT: 16 U/L (ref 0–44)
AST: 26 U/L (ref 15–41)
Albumin: 2.2 g/dL — ABNORMAL LOW (ref 3.5–5.0)
Alkaline Phosphatase: 63 U/L (ref 38–126)
Anion gap: 7 (ref 5–15)
BUN: 17 mg/dL (ref 8–23)
CO2: 25 mmol/L (ref 22–32)
Calcium: 8.1 mg/dL — ABNORMAL LOW (ref 8.9–10.3)
Chloride: 103 mmol/L (ref 98–111)
Creatinine, Ser: 1.01 mg/dL (ref 0.61–1.24)
GFR, Estimated: >60 mL/min (ref >60)
Glucose, Bld: 126 mg/dL — ABNORMAL HIGH (ref 70–99)
Potassium: 3.7 mmol/L (ref 3.5–5.1)
Sodium: 135 mmol/L (ref 135–145)
Total Bilirubin: 1 mg/dL (ref 0.3–1.2)
Total Protein: 6.8 g/dL (ref 6.5–8.1)

## 2021-01-21 LAB — HEPARIN LEVEL (UNFRACTIONATED)
Heparin Unfractionated: 0.37 IU/mL (ref 0.30–0.70)
Heparin Unfractionated: <0.1 IU/mL — ABNORMAL LOW (ref 0.30–0.70)
Heparin Unfractionated: 0.37 IU/mL (ref 0.30–0.70)

## 2021-01-21 LAB — MAGNESIUM: Magnesium: 2.1 mg/dL (ref 1.7–2.4)

## 2021-01-21 LAB — PHOSPHORUS: Phosphorus: 2.7 mg/dL (ref 2.5–4.6)

## 2021-01-21 MED ORDER — IOHEXOL 350 MG/ML SOLN
75.0000 mL | Freq: Once | INTRAVENOUS | Status: AC | PRN
  Administered 2021-01-21: 75 mL via INTRAVENOUS

## 2021-01-21 MED ORDER — HEPARIN BOLUS VIA INFUSION
2200.0000 [IU] | Freq: Once | INTRAVENOUS | Status: AC
  Administered 2021-01-21: 2200 [IU] via INTRAVENOUS
  Filled 2021-01-21: qty 2200

## 2021-01-21 MED ORDER — SODIUM CHLORIDE 0.9 % IV BOLUS
500.0000 mL | Freq: Once | INTRAVENOUS | Status: AC
  Administered 2021-01-21: 500 mL via INTRAVENOUS

## 2021-01-21 NOTE — Progress Notes (Signed)
PT Cancellation Note  Patient Details Name: Ronnie Bullock MRN: [id number] DOB: [birth year]   Cancelled Treatment:    Reason Eval/Treat Not Completed: Patient declined, no reason specified.  Pt eating dinner at this time with nursing in the room.  Nursing notes pt's HR has been inconsistent today and CT ruled out PE.  Pt will be attempted to be seen at later date/time as medically appropriate.   Gwenlyn Saran, PT, DPT 01/21/21, 4:14 PM

## 2021-01-21 NOTE — Consult Note (Signed)
ANTICOAGULATION CONSULT NOTE  Pharmacy Consult for Heparin  Indication: DVT  Patient Measurements: Heparin Dosing Weight: 74.8 kg  Labs: Recent Labs    01/20/21 1129 01/20/21 1412 01/20/21 2157 01/21/21 0542 01/21/21 1357 01/21/21 2148  HGB 12.9*  --   --  12.3*  --   --   HCT 39.8  --   --  37.1*  --   --   PLT 209  --   --  220  --   --   APTT  --  <24*  --   --   --   --   LABPROT  --  15.1  --   --   --   --   INR  --  1.2  --   --   --   --   HEPARINUNFRC  --   --    < > 0.37 <0.10* 0.37  CREATININE 1.28*  --   --  1.01  --   --    < > = values in this interval not displayed.    Estimated Creatinine Clearance: 64.2 mL/min (by C-G formula based on SCr of 1.01 mg/dL).   Medical History: Past Medical History:  Diagnosis Date  . Dementia ([place])   . Dementia ([place])   . Hyperlipidemia   . Hypertension   . Incontinence     Assessment: Pharmacy consulted to started started on heparin for new DVT. No DOAC PTA. CBC stable.   Goal of Therapy:  Heparin level 0.3-0.7 units/ml Monitor platelets by anticoagulation protocol: Yes   3/16 2157 HL < 0.10 3/17 0542 HL 0.37 3/17 1357 HL < 0.10 3/17 2148 HL 0.37  Plan:  Heparin level therapeutic x 1. Continue heparin at current infusion rate of 1700 units/hr. Recheck HL tomorrow AM. CBC daily while on heparin drip.  Benita Gutter 01/21/2021,10:54 PM

## 2021-01-21 NOTE — Progress Notes (Addendum)
PROGRESS NOTE    Ronnie Bullock   BXU:[id number]  DOB: 1946-02-22  DOA: 01/20/2021 PCP: [name], DO   Brief Narrative:  Ronnie Bullock is a 75 year old male with dementia, prostate cancer, hypertension and hyperlipidemia who presents with left lower extremity swelling.  He has been living with his brother lately and has been mostly sedentary.  Work-up in the ED reveals a left lower extremity DVT.  Vascular surgery was consulted and has decided on a thrombectomy this coming Friday 3/18.  The patient has been started on a heparin infusion.   Subjective: Sitting up in the chair and is pleasantly confused.  He has no complaints.    Assessment & Plan:   Principal Problem:   DVT (deep venous thrombosis)   - cont Heparin while awaiting thrombectomy tomorrow  Active Problems:  Sinus tachycardia - checked CT chest for PE- negative - at breakfast, he seemed to have drank all of the fluids but urine output has been poor today- will give NS bolus 500 cc-     Essential hypertension, benign - takes Amlodipine 5 mg at home     Prostate cancer and BPH - cont Flomax    Dementia without behavioral disturbance (HCC) - follow   Time spent in minutes: 35 DVT prophylaxis: Heparin infusion Code Status: Full code Family Communication:  Level of Care: Level of care: Med-Surg Disposition Plan:  Status is: Inpatient  Remains inpatient appropriate because:IV treatments appropriate due to intensity of illness or inability to take PO and Inpatient level of care appropriate due to severity of illness   Dispo:  Patient From: Home  Planned Disposition: Home with Health Care Svc  Medically stable for discharge: No     Consultants:   Vascular surgery Procedures:   none Antimicrobials:  Anti-infectives (From admission, onward)   None       Objective: Vitals:   01/20/21 2352 01/21/21 0440 01/21/21 1000 01/21/21 1114  BP: 134/87 (!) 143/93 (!) 144/66 (!) 144/78  Pulse:  86 78  83  Resp: 20 18 18    Temp: (!) 97.4 F (36.3 C) 98.7 F (37.1 C) 97.7 F (36.5 C) 97.7 F (36.5 C)  TempSrc:  Oral Oral   SpO2: 99% 99% 97% 98%  Weight:      Height:        Intake/Output Summary (Last 24 hours) at 01/21/2021 1332 Last data filed at 01/21/2021 1032 Gross per 24 hour  Intake 561.69 ml  Output 250 ml  Net 311.69 ml   Filed Weights   01/20/21 1036  Weight: 74.8 kg    Examination: General exam: Appears comfortable  HEENT: PERRLA, oral mucosa moist, no sclera icterus or thrush Respiratory system: Clear to auscultation. Respiratory effort normal. Cardiovascular system: S1 & S2 heard, RRR.  Tachycardic  Gastrointestinal system: Abdomen soft, non-tender, nondistended. Normal bowel sounds. Central nervous system: Alert and oriented to person. No focal neurological deficits. Extremities: No cyanosis, clubbing - left leg 2 + edema Skin: No rashes or ulcers Psychiatry:  Mood & affect appropriate.     Data Reviewed: I have personally reviewed following labs and imaging studies  CBC: Recent Labs  Lab 01/20/21 1129 01/21/21 0542  WBC 9.8 9.2  NEUTROABS 6.7  --   HGB 12.9* 12.3*  HCT 39.8 37.1*  MCV 91.9 90.5  PLT 209 191   Basic Metabolic Panel: Recent Labs  Lab 01/20/21 1129 01/21/21 0542  NA 138 135  K 4.4 3.7  CL 102 103  CO2  28 25  GLUCOSE 124* 126*  BUN 18 17  CREATININE 1.28* 1.01  CALCIUM 8.7* 8.1*  MG  --  2.1  PHOS  --  2.7   GFR: Estimated Creatinine Clearance: 64.2 mL/min (by C-G formula based on SCr of 1.01 mg/dL). Liver Function Tests: Recent Labs  Lab 01/20/21 1129 01/21/21 0542  AST 23 26  ALT 12 16  ALKPHOS 67 63  BILITOT 1.6* 1.0  PROT 7.4 6.8  ALBUMIN 2.6* 2.2*   No results for input(s): LIPASE, AMYLASE in the last 168 hours. No results for input(s): AMMONIA in the last 168 hours. Coagulation Profile: Recent Labs  Lab 01/20/21 1412  INR 1.2   Cardiac Enzymes: No results for input(s): CKTOTAL, CKMB,  CKMBINDEX, TROPONINI in the last 168 hours. BNP (last 3 results) No results for input(s): PROBNP in the last 8760 hours. HbA1C: No results for input(s): HGBA1C in the last 72 hours. CBG: No results for input(s): GLUCAP in the last 168 hours. Lipid Profile: No results for input(s): CHOL, HDL, LDLCALC, TRIG, CHOLHDL, LDLDIRECT in the last 72 hours. Thyroid Function Tests: No results for input(s): TSH, T4TOTAL, FREET4, T3FREE, THYROIDAB in the last 72 hours. Anemia Panel: No results for input(s): VITAMINB12, FOLATE, FERRITIN, TIBC, IRON, RETICCTPCT in the last 72 hours. Urine analysis:    Component Value Date/Time   COLORURINE YELLOW (A) 12/02/2020 1108   APPEARANCEUR CLEAR (A) 12/02/2020 1108   APPEARANCEUR Cloudy (A) 11/12/2020 1233   LABSPEC 1.019 12/02/2020 1108   PHURINE 5.0 12/02/2020 1108   GLUCOSEU NEGATIVE 12/02/2020 1108   HGBUR NEGATIVE 12/02/2020 1108   BILIRUBINUR NEGATIVE 12/02/2020 1108   BILIRUBINUR Negative 11/12/2020 [place] 12/02/2020 1108   PROTEINUR 30 (A) 12/02/2020 1108   UROBILINOGEN 0.2 10/30/2020 1403   NITRITE NEGATIVE 12/02/2020 1108   LEUKOCYTESUR NEGATIVE 12/02/2020 1108   Sepsis Labs: @LABRCNTIP (procalcitonin:4,lacticidven:4) )No results found for this or any previous visit (from the past 240 hour(s)).       Radiology Studies: US Venous Img Lower Unilateral Left  Result Date: 01/20/2021 CLINICAL DATA:  75 year old with left leg swelling. EXAM: LEFT LOWER EXTREMITY VENOUS DOPPLER ULTRASOUND TECHNIQUE: Gray-scale sonography with graded compression, as well as color Doppler and duplex ultrasound were performed to evaluate the lower extremity deep venous systems from the level of the common femoral vein and including the common femoral, femoral, profunda femoral, popliteal and calf veins including the posterior tibial, peroneal and gastrocnemius veins when visible. The superficial great saphenous vein was also interrogated. Spectral  Doppler was utilized to evaluate flow at rest and with distal augmentation maneuvers in the common femoral, femoral and popliteal veins. COMPARISON:  None. FINDINGS: Contralateral Common Femoral Vein: No thrombus. Normal compressibility and color Doppler flow. Common Femoral Vein: Partial compressibility of the left great common femoral vein near the saphenofemoral junction with nonocclusive thrombus in the left common femoral vein. Saphenofemoral Junction: Patent Profunda Femoral Vein: Positive for echogenic thrombus. Femoral Vein: Positive for thrombus. Echogenic thrombus in the left femoral vein without significant flow. Popliteal Vein: Positive for echogenic thrombus. Evidence for occlusive thrombus. Calf Veins: Positive for thrombus. No significant flow identified in the left posterior tibial or left peroneal veins. Other Findings:  None. IMPRESSION: Positive for deep venous thrombosis in the left lower extremity. Thrombus involves the distal left common femoral vein, left femoral vein, left popliteal vein and left deep calf veins. Occlusive thrombus involving the left femoral vein, left popliteal vein and left deep calf veins. These results were  called by telephone at the time of interpretation on 01/20/2021 at 12:13 pm to provider Merlyn Lot , who verbally acknowledged these results. Electronically Signed   By: Markus Daft M.D.   On: 01/20/2021 12:15   DG Chest Portable 1 View  Result Date: 01/20/2021 CLINICAL DATA:  Left lower extremity swelling. EXAM: PORTABLE CHEST 1 VIEW COMPARISON:  None. FINDINGS: The heart size and mediastinal contours are within normal limits. Both lungs are clear. The visualized skeletal structures are unremarkable. IMPRESSION: No active disease. Electronically Signed   By: Marijo Conception M.D.   On: 01/20/2021 11:56      Scheduled Meds: . amLODipine  2.5 mg Oral Daily  . multivitamin with minerals  1 tablet Oral Daily   Continuous Infusions: . sodium chloride 30  mL/hr at 01/21/21 0337  . heparin 1,500 Units/hr (01/21/21 0726)     LOS: 1 day      Debbe Odea, MD [facility] Pager: [email or web address] 01/21/2021, 1:32 PM

## 2021-01-21 NOTE — Consult Note (Signed)
ANTICOAGULATION CONSULT NOTE  Pharmacy Consult for Heparin  Indication: DVT  No Known Allergies  Patient Measurements: Height: 5\' 10"  (177.8 cm) Weight: 74.8 kg (165 lb) IBW/kg (Calculated) : 73 Heparin Dosing Weight: 74.8 kg  Vital Signs: Temp: 98.7 F (37.1 C) (03/17 0440) Temp Source: Oral (03/17 0440) BP: 143/93 (03/17 0440) Pulse Rate: 78 (03/17 0440)  Labs: Recent Labs    01/20/21 1129 01/20/21 1412 01/20/21 2157 01/21/21 0542  HGB 12.9*  --   --  12.3*  HCT 39.8  --   --  37.1*  PLT 209  --   --  220  APTT  --  <24*  --   --   LABPROT  --  15.1  --   --   INR  --  1.2  --   --   HEPARINUNFRC  --   --  <0.10* 0.37  CREATININE 1.28*  --   --  1.01    Estimated Creatinine Clearance: 64.2 mL/min (by C-G formula based on SCr of 1.01 mg/dL).   Medical History: Past Medical History:  Diagnosis Date  . Dementia ([place])   . Dementia ([place])   . Hyperlipidemia   . Hypertension   . Incontinence     Assessment: Pharmacy consulted to started started on heparin for new DVT. No DOAC PTA. CBC stable.   Goal of Therapy:  Heparin level 0.3-0.7 units/ml Monitor platelets by anticoagulation protocol: Yes   3/16 2157 HL < 0.1 3/17 0542 HL 0.37  Plan:  Heparin level therapeutic. Continue heparin infusion at 1500 units/hr. Recheck HL at 1400 to confirm. CBC daily while on heparin.   Tawnya Crook, PharmD Clinical Pharmacist 01/21/2021,10:08 AM

## 2021-01-21 NOTE — Consult Note (Signed)
ANTICOAGULATION CONSULT NOTE  Pharmacy Consult for Heparin  Indication: DVT  No Known Allergies  Patient Measurements: Height: 5\' 10"  (177.8 cm) Weight: 74.8 kg (165 lb) IBW/kg (Calculated) : 73 Heparin Dosing Weight: 74.8 kg  Vital Signs: Temp: 97.7 F (36.5 C) (03/17 1114) Temp Source: Oral (03/17 1000) BP: 144/78 (03/17 1114) Pulse Rate: 83 (03/17 1114)  Labs: Recent Labs    01/20/21 1129 01/20/21 1412 01/20/21 2157 01/21/21 0542 01/21/21 1357  HGB 12.9*  --   --  12.3*  --   HCT 39.8  --   --  37.1*  --   PLT 209  --   --  220  --   APTT  --  <24*  --   --   --   LABPROT  --  15.1  --   --   --   INR  --  1.2  --   --   --   HEPARINUNFRC  --   --  <0.10* 0.37 <0.10*  CREATININE 1.28*  --   --  1.01  --     Estimated Creatinine Clearance: 64.2 mL/min (by C-G formula based on SCr of 1.01 mg/dL).   Medical History: Past Medical History:  Diagnosis Date  . Dementia ([place])   . Dementia ([place])   . Hyperlipidemia   . Hypertension   . Incontinence     Assessment: Pharmacy consulted to started started on heparin for new DVT. No DOAC PTA. CBC stable.   Goal of Therapy:  Heparin level 0.3-0.7 units/ml Monitor platelets by anticoagulation protocol: Yes   3/16 2157 HL < 0.10 3/17 0542 HL 0.37 3/17 1357 HL < 0.10  Plan:  Heparin level subtherapeutic. Heparin 2200 unit bolus followed by increase in infusion rate to 1700 units/hr. Recheck HL at 2200. CBC daily while on heparin drip.  Tawnya Crook, PharmD Clinical Pharmacist 01/21/2021,3:18 PM

## 2021-01-21 NOTE — Progress Notes (Signed)
PT Cancellation Note  Patient Details Name: Ronnie Bullock MRN: [id number] DOB: [birth year]   Cancelled Treatment:     PT order received and chart reviewed pt.  Pt currently at CT for scan.  Pt will be attempted to be evaluated at later date/time as medically appropriate.     Gwenlyn Saran, PT, DPT 01/21/21, 2:40 PM

## 2021-01-21 NOTE — Evaluation (Signed)
Occupational Therapy Evaluation Patient Details Name: Ronnie Bullock MRN: [id number] DOB: 1946/04/19 Today's Date: 01/21/2021    History of Present Illness Pt is a 75 y/o M with PMH: dementia, prostate CA, HLD,and HTN who presents with L LE and scrotal-area swelling. W/u in ED reveals L LE DVT.  Vascular surgery was consulted and has decided on a thrombectomy this coming Friday 3/18.  The patient has been started on a heparin infusion.   Clinical Impression   Pt seen for OT evaluation this date in setting of acute hospitalization d/t L LE DVT. Pt is questionable historian as he is pleasantly confused. He does appropriately follow commands and he is oriented to self, some aspects of location and some aspects of situation, but is noted to have word finding difficulties and poor short term memory. Pt noted to have heart rate somewhat elevated and fluctuating between 101 and 140bpm this date. RN notified and has paged MD (okay'ed light OT evaluation). Pt states that he is able to perform his self care at baseline and uses no AD for fxl mobility. On assessment, pt requires SETUP and cues to perform seated UB ADLs including bathing, dressing and grooming and requires MOD A for LB ADLs d/t L LE edema and for energy conservation considering heart rate. Pt requires MIN A For ADL Transfers with RW and cues for safety. Fxl mobility deferred this date until HR is better controlled. Pt left in chair with chair alarm and all needs met/in reach, RN present in room administering medication. Will continue to follow acutely. Anticipate pt will require STR f/u to improve INDEP with ADLs/ADL transfers for safe home discharge.     Follow Up Recommendations  Supervision/Assistance - 24 hour (defer to next level of care)    Equipment Recommendations  Other (comment) (defer to next level of care)    Recommendations for Other Services       Precautions / Restrictions Precautions Precautions:  Fall Restrictions Weight Bearing Restrictions: No      Mobility Bed Mobility Overal bed mobility: Needs Assistance Bed Mobility: Supine to Sit     Supine to sit: Min guard;HOB elevated     General bed mobility comments: increased time, use of rails, cues to sequence    Transfers Overall transfer level: Needs assistance Equipment used: Rolling walker (2 wheeled) Transfers: Sit to/from [facility] Sit to Stand: Min assist Stand pivot transfers: Min assist       General transfer comment: cues for safe use of RW, increased time    Balance Overall balance assessment: Needs assistance Sitting-balance support: Feet supported Sitting balance-Leahy Scale: Good       Standing balance-Leahy Scale: Fair Standing balance comment: requires UE Support on RW                           ADL either performed or assessed with clinical judgement   ADL                                         General ADL Comments: Pt able to perform UB ADLs with SETUP and MIN verbal cues to sequence. Requires MOD A For LB ADLs (he is able to don clothing to R LE, but diffiuclty with L LE d/t ROM currently reduced 2/2 edema)     Vision Patient Visual Report: No change from baseline Additional  Comments: difficult to formally assess d/t pt cognition, but he appears to track appropriately     Perception     Praxis      Pertinent Vitals/Pain Pain Assessment: Faces Faces Pain Scale: Hurts little more Pain Location: L LE Pain Descriptors / Indicators: Guarding;Tender Pain Intervention(s): Limited activity within patient's tolerance;Monitored during session;Repositioned     Hand Dominance     Extremity/Trunk Assessment Upper Extremity Assessment Upper Extremity Assessment: Overall WFL for tasks assessed;Generalized weakness (ROM WFL, MMT Grossly 4-/5)   Lower Extremity Assessment Lower Extremity Assessment: Defer to PT evaluation;RLE  deficits/detail;LLE deficits/detail RLE Deficits / Details: ROM WFL for functional tasks observed LLE Deficits / Details: decreased tolerance for hip rotation, flexion and abduction d/t pain in L LE 2/2 swelling.       Communication     Cognition Arousal/Alertness: Awake/alert Behavior During Therapy: WFL for tasks assessed/performed Overall Cognitive Status: No family/caregiver present to determine baseline cognitive functioning                                 General Comments: Pt is oriented to self and situation and somewhat location (he does eventually say hospital although he is having word-finding difficulties and is noted to look to whiteboard for anwers). He is otherwise pleasntly confused, but able to follow commands. He does have delayed processing and responses.   General Comments       Exercises Other Exercises Other Exercises: OT engages pt in seated UB bathing/dressing tasks with SETUP and MOD A For seated LB ADLs as well as MIN A For ADL transfers with RW and at least CGA for static standing, MIN A For fxl mobility.   Shoulder Instructions      Home Living Family/patient expects to be discharged to:: Private residence Living Arrangements: Other relatives (brother)   Type of Home: House Home Access: Stairs to enter Technical brewer of Steps: 3 Entrance Stairs-Rails: Right Home Layout: One level               Home Equipment: None          Prior Functioning/Environment Level of Independence: Independent        Comments: Pt is questionable historian as he is confused and only oriented to self and situation. He is pleasant and able to follow commands, but reports that he is able to perform all self care and still driving, but chart review indicates mostly sedentary since coming to live with his brother. Will attempt to verify PLOF with family as able.        OT Problem List: Decreased strength;Decreased activity tolerance;Impaired  balance (sitting and/or standing);Decreased cognition;Decreased safety awareness;Decreased knowledge of use of DME or AE;Cardiopulmonary status limiting activity      OT Treatment/Interventions: Self-care/ADL training;DME and/or AE instruction;Therapeutic activities;Balance training;Therapeutic exercise;Patient/family education    OT Goals(Current goals can be found in the care plan section) Acute Rehab OT Goals Patient Stated Goal: to get strong OT Goal Formulation: With patient Time For Goal Achievement: 02/04/21 Potential to Achieve Goals: Good ADL Goals Pt Will Perform Grooming: with supervision;with min guard assist;standing (with LRAD, sink-side to complete 2-3 g/h tasks to increase standing fxl activity tolerance) Pt Will Perform Lower Body Dressing: with supervision;with min guard assist;sit to/from stand (with <10% cues for sequencing) Pt Will Transfer to Toilet: with min guard assist;ambulating (with LRAD to restroom with BSC over commode to elevate as needed with <20% cues for safety  awareness.) Pt Will Perform Toileting - Clothing Manipulation and hygiene: with supervision;sit to/from stand (with use of grab bars) Pt/caregiver will Perform Home Exercise Program: Increased strength;Both right and left upper extremity;With minimal assist (with <20% cues for form/pace/technique.)  OT Frequency: Min 1X/week   Barriers to D/C:            Co-evaluation              AM-PAC OT "6 Clicks" Daily Activity     Outcome Measure Help from another person eating meals?: None Help from another person taking care of personal grooming?: A Little Help from another person toileting, which includes using toliet, bedpan, or urinal?: A Little Help from another person bathing (including washing, rinsing, drying)?: A Little Help from another person to put on and taking off regular upper body clothing?: A Little Help from another person to put on and taking off regular lower body clothing?: A  Little 6 Click Score: 19   End of Session Equipment Utilized During Treatment: Gait belt;Rolling walker Nurse Communication: Mobility status;Other (comment) (notified of HR rapidly alternating, worse with activity. RN okay's light evaluation)  Activity Tolerance: Patient tolerated treatment well;Other (comment) (HR noted to be fluctuating throughout session, RN notified and paged MD) Patient left: in chair;with call bell/phone within reach;with chair alarm set (RN present in room administerring medication)  OT Visit Diagnosis: Unsteadiness on feet (R26.81);Muscle weakness (generalized) (M62.81);Other symptoms and signs involving cognitive function                Time: 7253-6644 OT Time Calculation (min): 39 min Charges:  OT General Charges $OT Visit: 1 Visit OT Evaluation $OT Eval Moderate Complexity: 1 Mod OT Treatments $Self Care/Home Management : 8-22 mins $Therapeutic Activity: 8-22 mins  Gerrianne Scale, MS, OTR/L ascom [phone] 01/21/21, 3:11 PM

## 2021-01-22 ENCOUNTER — Encounter
Admission: EM | Disposition: A | Discharge status: Home Health | Payer: Self-pay | Source: Home / Self Care | Attending: Internal Medicine

## 2021-01-22 ENCOUNTER — Other Ambulatory Visit: Payer: Self-pay | Admitting: Family Medicine

## 2021-01-22 DIAGNOSIS — I82402 Acute embolism and thrombosis of unspecified deep veins of left lower extremity: Secondary | ICD-10-CM

## 2021-01-22 HISTORY — PX: PERIPHERAL VASCULAR THROMBECTOMY: CATH118306

## 2021-01-22 LAB — BASIC METABOLIC PANEL
Anion gap: 6 (ref 5–15)
BUN: 14 mg/dL (ref 8–23)
CO2: 25 mmol/L (ref 22–32)
Calcium: 8.3 mg/dL — ABNORMAL LOW (ref 8.9–10.3)
Chloride: 104 mmol/L (ref 98–111)
Creatinine, Ser: 0.97 mg/dL (ref 0.61–1.24)
GFR, Estimated: >60 mL/min (ref >60)
Glucose, Bld: 111 mg/dL — ABNORMAL HIGH (ref 70–99)
Potassium: 3.8 mmol/L (ref 3.5–5.1)
Sodium: 135 mmol/L (ref 135–145)

## 2021-01-22 LAB — CBC
HCT: 34.8 % — ABNORMAL LOW (ref 39.0–52.0)
Hemoglobin: 11.8 g/dL — ABNORMAL LOW (ref 13.0–17.0)
MCH: 30.6 pg (ref 26.0–34.0)
MCHC: 33.9 g/dL (ref 30.0–36.0)
MCV: 90.4 fL (ref 80.0–100.0)
Platelets: 256 K/uL (ref 150–400)
RBC: 3.85 MIL/uL — ABNORMAL LOW (ref 4.22–5.81)
RDW: 14.1 % (ref 11.5–15.5)
WBC: 9 K/uL (ref 4.0–10.5)
nRBC: 0 % (ref 0.0–0.2)

## 2021-01-22 LAB — HEPARIN LEVEL (UNFRACTIONATED): Heparin Unfractionated: 0.56 [IU]/mL (ref 0.30–0.70)

## 2021-01-22 SURGERY — PERIPHERAL VASCULAR THROMBECTOMY
Anesthesia: Moderate Sedation | Laterality: Left

## 2021-01-22 MED ORDER — HEPARIN SODIUM (PORCINE) 1000 UNIT/ML IJ SOLN
INTRAMUSCULAR | Status: DC | PRN
  Administered 2021-01-22: 3000 [IU] via INTRAVENOUS

## 2021-01-22 MED ORDER — FENTANYL CITRATE (PF) 100 MCG/2ML IJ SOLN
INTRAMUSCULAR | Status: AC
  Filled 2021-01-22: qty 2

## 2021-01-22 MED ORDER — HEPARIN SODIUM (PORCINE) 1000 UNIT/ML IJ SOLN
INTRAMUSCULAR | Status: AC
  Filled 2021-01-22: qty 1

## 2021-01-22 MED ORDER — METHYLPREDNISOLONE SODIUM SUCC 125 MG IJ SOLR: 125.0000 mg | Freq: Once | INTRAMUSCULAR | Status: DC | PRN

## 2021-01-22 MED ORDER — MIDAZOLAM HCL 5 MG/5ML IJ SOLN
INTRAMUSCULAR | Status: AC
  Filled 2021-01-22: qty 5

## 2021-01-22 MED ORDER — DIPHENHYDRAMINE HCL 50 MG/ML IJ SOLN: 50.0000 mg | Freq: Once | INTRAMUSCULAR | Status: DC | PRN

## 2021-01-22 MED ORDER — ONDANSETRON HCL 4 MG/2ML IJ SOLN: 4.0000 mg | Freq: Four times a day (QID) | INTRAMUSCULAR | Status: DC | PRN

## 2021-01-22 MED ORDER — HYDROMORPHONE HCL 1 MG/ML IJ SOLN
1.0000 mg | Freq: Once | INTRAMUSCULAR | Status: DC | PRN
Start: 2021-01-22 — End: 2021-01-26

## 2021-01-22 MED ORDER — FENTANYL CITRATE (PF) 100 MCG/2ML IJ SOLN
INTRAMUSCULAR | Status: DC | PRN
  Administered 2021-01-22: 25 ug via INTRAVENOUS
  Administered 2021-01-22: 50 ug via INTRAVENOUS

## 2021-01-22 MED ORDER — MIDAZOLAM HCL 2 MG/2ML IJ SOLN
INTRAMUSCULAR | Status: DC | PRN
  Administered 2021-01-22: 2 mg via INTRAVENOUS
  Administered 2021-01-22: 1 mg via INTRAVENOUS

## 2021-01-22 MED ORDER — FAMOTIDINE 20 MG PO TABS: 40.0000 mg | ORAL_TABLET | Freq: Once | ORAL | Status: DC | PRN

## 2021-01-22 MED ORDER — ALTEPLASE 2 MG IJ SOLR
INTRAMUSCULAR | Status: AC
  Filled 2021-01-22: qty 10

## 2021-01-22 MED ORDER — CEFAZOLIN SODIUM-DEXTROSE 1-4 GM/50ML-% IV SOLN
INTRAVENOUS | Status: AC
  Filled 2021-01-22: qty 50

## 2021-01-22 MED ORDER — IODIXANOL 320 MG/ML IV SOLN
INTRAVENOUS | Status: DC | PRN
  Administered 2021-01-22: 35 mL via INTRAVENOUS

## 2021-01-22 MED ORDER — MIDAZOLAM HCL 2 MG/ML PO SYRP: 8.0000 mg | ORAL_SOLUTION | Freq: Once | ORAL | Status: DC | PRN

## 2021-01-22 MED ORDER — HEPARIN (PORCINE) 25000 UT/250ML-% IV SOLN
2200.0000 [IU]/h | INTRAVENOUS | Status: DC
  Administered 2021-01-24 – 2021-01-25 (×2): 2200 [IU]/h via INTRAVENOUS
  Filled 2021-01-22 (×5): qty 250

## 2021-01-22 MED ORDER — CEFAZOLIN SODIUM-DEXTROSE 1-4 GM/50ML-% IV SOLN
1.0000 g | Freq: Once | INTRAVENOUS | Status: AC
  Administered 2021-01-22: 1 g via INTRAVENOUS

## 2021-01-22 MED ORDER — SODIUM CHLORIDE 0.9 % IV BOLUS
500.0000 mL | Freq: Once | INTRAVENOUS | Status: AC
  Administered 2021-01-22: 500 mL via INTRAVENOUS

## 2021-01-22 MED ORDER — SODIUM CHLORIDE 0.9 % IV SOLN: INTRAVENOUS | Status: DC

## 2021-01-22 SURGICAL SUPPLY — 16 items
BALLN ULTRVRSE 8X60X75C (BALLOONS) ×2
BALLOON ULTRVRSE 8X60X75C (BALLOONS) ×1 IMPLANT
CANISTER PENUMBRA ENGINE (MISCELLANEOUS) ×2 IMPLANT
CANNULA 5F STIFF (CANNULA) ×2 IMPLANT
CATH BEACON 5 .035 65 KMP TIP (CATHETERS) ×2 IMPLANT
CATH INDIGO 12XTORQ 100 (CATHETERS) ×2 IMPLANT
CATH INDIGO SEP 12 (CATHETERS) ×2 IMPLANT
CATH INFUS 135CMX30CM (CATHETERS) ×2 IMPLANT
COVER PROBE U/S 5X48 (MISCELLANEOUS) ×2 IMPLANT
GLIDEWIRE ADV .035X260CM (WIRE) ×4 IMPLANT
GUIDEWIRE ADV .018X180CM (WIRE) ×2 IMPLANT
GUIDEWIRE AMPLATZ SHORT (WIRE) ×2 IMPLANT
KIT ENCORE 26 ADVANTAGE (KITS) ×2 IMPLANT
PACK ANGIOGRAPHY (CUSTOM PROCEDURE TRAY) ×2 IMPLANT
SHEATH PINNACLE 11FRX10 (SHEATH) ×2 IMPLANT
WIRE GUIDERIGHT .035X150 (WIRE) ×2 IMPLANT

## 2021-01-22 NOTE — Telephone Encounter (Signed)
Requested medications are due for refill today yes  Requested medications are on the active medication list yes  Last refill 11/03/20  Last visit 11/10/20  Future visit scheduled 03/2021  Notes to clinic There are two Norvasc rx on current med list, different doses. Pharm noted that pt stated she is not taking Norvasc 5mg . Please assess.

## 2021-01-22 NOTE — Consult Note (Addendum)
ANTICOAGULATION CONSULT NOTE  Pharmacy Consult for Heparin  Indication: DVT  Patient Measurements: Heparin Dosing Weight: 74.8 kg  Labs: Recent Labs    01/20/21 1129 01/20/21 1412 01/20/21 2157 01/21/21 0542 01/21/21 1357 01/21/21 2148 01/22/21 0609  HGB 12.9*  --   --  12.3*  --   --  11.8*  HCT 39.8  --   --  37.1*  --   --  34.8*  PLT 209  --   --  220  --   --  256  APTT  --  <24*  --   --   --   --   --   LABPROT  --  15.1  --   --   --   --   --   INR  --  1.2  --   --   --   --   --   HEPARINUNFRC  --   --    < > 0.37 <0.10* 0.37 0.56  CREATININE 1.28*  --   --  1.01  --   --  0.97   < > = values in this interval not displayed.    Estimated Creatinine Clearance: 66.9 mL/min (by C-G formula based on SCr of 0.97 mg/dL).   Medical History: Past Medical History:  Diagnosis Date  . Dementia ([place])   . Dementia ([place])   . Hyperlipidemia   . Hypertension   . Incontinence     Assessment: Pharmacy consulted to started started on heparin for new DVT. No DOAC PTA. CBC stable.   Goal of Therapy:  Heparin level 0.3-0.7 units/ml Monitor platelets by anticoagulation protocol: Yes   3/16 2157 HL < 0.10 3/17 0542 HL 0.37 3/17 1357 HL < 0.10 3/17 2148 HL 0.37 3/18 0609 HL 0.56  **Heparin was stopped for approximately 3.5 hours during procedure**  Plan:  Per discussion with Vascular team, will restart heparin drip this afternoon(1715) post-procedure with no bolus at the previous infusion rate of 1700 units/hr. Recheck HL in 8 hours. CBC daily while on heparin drip.  Hatsue Sime A Kenora Spayd 01/22/2021,5:20 PM

## 2021-01-22 NOTE — Consult Note (Signed)
ANTICOAGULATION CONSULT NOTE  Pharmacy Consult for Heparin  Indication: DVT  Patient Measurements: Heparin Dosing Weight: 74.8 kg  Labs: Recent Labs    01/20/21 1129 01/20/21 1412 01/20/21 2157 01/21/21 0542 01/21/21 1357 01/21/21 2148 01/22/21 0609  HGB 12.9*  --   --  12.3*  --   --  11.8*  HCT 39.8  --   --  37.1*  --   --  34.8*  PLT 209  --   --  220  --   --  256  APTT  --  <24*  --   --   --   --   --   LABPROT  --  15.1  --   --   --   --   --   INR  --  1.2  --   --   --   --   --   HEPARINUNFRC  --   --    < > 0.37 <0.10* 0.37 0.56  CREATININE 1.28*  --   --  1.01  --   --  0.97   < > = values in this interval not displayed.    Estimated Creatinine Clearance: 66.9 mL/min (by C-G formula based on SCr of 0.97 mg/dL).   Medical History: Past Medical History:  Diagnosis Date  . Dementia ([place])   . Dementia ([place])   . Hyperlipidemia   . Hypertension   . Incontinence     Assessment: Pharmacy consulted to started started on heparin for new DVT. No DOAC PTA. CBC stable.   Goal of Therapy:  Heparin level 0.3-0.7 units/ml Monitor platelets by anticoagulation protocol: Yes   3/16 2157 HL < 0.10 3/17 0542 HL 0.37 3/17 1357 HL < 0.10 3/17 2148 HL 0.37 3/18 0609 HL 0.56  Plan:  Heparin level therapeutic x 2. Continue heparin at current infusion rate of 1700 units/hr. Recheck HL tomorrow AM. CBC daily while on heparin drip.  Eda Magnussen D 01/22/2021,7:04 AM

## 2021-01-22 NOTE — Telephone Encounter (Signed)
Called pt is still in hosp advised would need a hosp f/u

## 2021-01-22 NOTE — Plan of Care (Signed)
Continuing with plan of care. 

## 2021-01-22 NOTE — Interval H&P Note (Signed)
History and Physical Interval Note:  01/22/2021 4:39 PM  Ronnie Bullock  has presented today for surgery, with the diagnosis of DVT.  The various methods of treatment have been discussed with the patient and family. After consideration of risks, benefits and other options for treatment, the patient has consented to  Procedure(s): PERIPHERAL VASCULAR THROMBECTOMY / THROMBOLYSIS (Left) as a surgical intervention.  The patient's history has been reviewed, patient examined, no change in status, stable for surgery.  I have reviewed the patient's chart and labs.  Questions were answered to the patient's satisfaction.     Hortencia Pilar

## 2021-01-22 NOTE — Progress Notes (Signed)
PT Cancellation Note  Patient Details Name: Ronnie Bullock MRN: [id number] DOB: 18-Aug-1946   Cancelled Treatment:    Reason Eval/Treat Not Completed: Patient at procedure or test/unavailable Orders received, chart reviewed. Pt out of room at procedure (scheduled thrombectomy). Will f/u as able as pt is available & medically appropriate.   Lavone Nian, PT, DPT 01/22/21, 1:33 PM    [name] 01/22/2021, 1:33 PM

## 2021-01-22 NOTE — Progress Notes (Signed)
PROGRESS NOTE    Ronnie Bullock   EQA:[id number]  DOB: 02-16-1946  DOA: 01/20/2021 PCP: [name], DO   Brief Narrative:  Ronnie Bullock is a 75 year old male with dementia, prostate cancer, hypertension and hyperlipidemia who presents with left lower extremity swelling.  He has been living with his brother lately and has been mostly sedentary.  Work-up in the ED reveals a left lower extremity DVT.  Vascular surgery was consulted and has decided on a thrombectomy this coming Friday 3/18.  The patient has been started on a heparin infusion.   Subjective: No complaints today.     Assessment & Plan:   Principal Problem:   DVT (deep venous thrombosis)   -   awaiting thrombectomy today  Active Problems:  Sinus tachycardia - checked CT chest for PE- negative - at breakfast, he seemed to have drank all of the fluids but urine output has been poor today- given NS bolus 500 cc- had 600 cc urine output yesteday - will give another 500 cc bolus now as he is still tachycardic    Essential hypertension, benign - takes Amlodipine 5 mg at home     Prostate cancer and BPH - cont Flomax    Dementia without behavioral disturbance (HCC) - has been pleasant w/o behavioral disturbances in the hospital   Time spent in minutes: 35 DVT prophylaxis: Heparin infusion Code Status: Full code Family Communication:  Level of Care: Level of care: Med-Surg Disposition Plan:  Status is: Inpatient  Remains inpatient appropriate because:IV treatments appropriate due to intensity of illness or inability to take PO and Inpatient level of care appropriate due to severity of illness   Dispo:  Patient From: Home  Planned Disposition: Home with Health Care Svc  Medically stable for discharge: No     Consultants:   Vascular surgery Procedures:   none Antimicrobials:  Anti-infectives (From admission, onward)   None       Objective: Vitals:   01/21/21 2044 01/21/21 2316  01/22/21 0356 01/22/21 1220  BP: (!) 141/98 (!) 149/92 (!) 149/87 122/83  Pulse: (!) 52  94 88  Resp: 20  20 17   Temp: 98.1 F (36.7 C) 98.6 F (37 C) 99.1 F (37.3 C) (!) 97.5 F (36.4 C)  TempSrc: Oral Oral Oral   SpO2: 98% 97% 97% 99%  Weight:      Height:        Intake/Output Summary (Last 24 hours) at 01/22/2021 1239 Last data filed at 01/22/2021 9798 Gross per 24 hour  Intake 240 ml  Output 600 ml  Net -360 ml   Filed Weights   01/20/21 1036  Weight: 74.8 kg    Examination: General exam: Appears comfortable  HEENT: PERRLA, oral mucosa moist, no sclera icterus or thrush Respiratory system: Clear to auscultation. Respiratory effort normal. Cardiovascular system: S1 & S2 heard, regular rate and rhythm- tachycardic Gastrointestinal system: Abdomen soft, non-tender, nondistended. Normal bowel sounds   Central nervous system: Alert and oriented. No focal neurological deficits. Extremities: No cyanosis, clubbing - 2 + left pedal edema Skin: No rashes or ulcers Psychiatry:  Mood & affect appropriate.  Data Reviewed: I have personally reviewed following labs and imaging studies  CBC: Recent Labs  Lab 01/20/21 1129 01/21/21 0542 01/22/21 0609  WBC 9.8 9.2 9.0  NEUTROABS 6.7  --   --   HGB 12.9* 12.3* 11.8*  HCT 39.8 37.1* 34.8*  MCV 91.9 90.5 90.4  PLT 209 220 921   Basic Metabolic  Panel: Recent Labs  Lab 01/20/21 1129 01/21/21 0542 01/22/21 0609  NA 138 135 135  K 4.4 3.7 3.8  CL 102 103 104  CO2 28 25 25   GLUCOSE 124* 126* 111*  BUN 18 17 14   CREATININE 1.28* 1.01 0.97  CALCIUM 8.7* 8.1* 8.3*  MG  --  2.1  --   PHOS  --  2.7  --    GFR: Estimated Creatinine Clearance: 66.9 mL/min (by C-G formula based on SCr of 0.97 mg/dL). Liver Function Tests: Recent Labs  Lab 01/20/21 1129 01/21/21 0542  AST 23 26  ALT 12 16  ALKPHOS 67 63  BILITOT 1.6* 1.0  PROT 7.4 6.8  ALBUMIN 2.6* 2.2*   No results for input(s): LIPASE, AMYLASE in the last 168  hours. No results for input(s): AMMONIA in the last 168 hours. Coagulation Profile: Recent Labs  Lab 01/20/21 1412  INR 1.2   Cardiac Enzymes: No results for input(s): CKTOTAL, CKMB, CKMBINDEX, TROPONINI in the last 168 hours. BNP (last 3 results) No results for input(s): PROBNP in the last 8760 hours. HbA1C: No results for input(s): HGBA1C in the last 72 hours. CBG: No results for input(s): GLUCAP in the last 168 hours. Lipid Profile: No results for input(s): CHOL, HDL, LDLCALC, TRIG, CHOLHDL, LDLDIRECT in the last 72 hours. Thyroid Function Tests: No results for input(s): TSH, T4TOTAL, FREET4, T3FREE, THYROIDAB in the last 72 hours. Anemia Panel: No results for input(s): VITAMINB12, FOLATE, FERRITIN, TIBC, IRON, RETICCTPCT in the last 72 hours. Urine analysis:    Component Value Date/Time   COLORURINE YELLOW (A) 12/02/2020 1108   APPEARANCEUR CLEAR (A) 12/02/2020 1108   APPEARANCEUR Cloudy (A) 11/12/2020 1233   LABSPEC 1.019 12/02/2020 1108   PHURINE 5.0 12/02/2020 1108   GLUCOSEU NEGATIVE 12/02/2020 1108   HGBUR NEGATIVE 12/02/2020 1108   BILIRUBINUR NEGATIVE 12/02/2020 1108   BILIRUBINUR Negative 11/12/2020 [place] 12/02/2020 1108   PROTEINUR 30 (A) 12/02/2020 1108   UROBILINOGEN 0.2 10/30/2020 1403   NITRITE NEGATIVE 12/02/2020 1108   LEUKOCYTESUR NEGATIVE 12/02/2020 1108   Sepsis Labs: @LABRCNTIP (procalcitonin:4,lacticidven:4) )No results found for this or any previous visit (from the past 240 hour(s)).       Radiology Studies: CT ANGIO CHEST PE W OR WO CONTRAST  Result Date: 01/21/2021 CLINICAL DATA:  Left lower extremity DVT. EXAM: CT ANGIOGRAPHY CHEST WITH CONTRAST TECHNIQUE: Multidetector CT imaging of the chest was performed using the standard protocol during bolus administration of intravenous contrast. Multiplanar CT image reconstructions and MIPs were obtained to evaluate the vascular anatomy. CONTRAST:  22mL OMNIPAQUE IOHEXOL 350  MG/ML SOLN COMPARISON:  Chest x-ray from yesterday. FINDINGS: Cardiovascular: Satisfactory opacification of the pulmonary arteries to the segmental level. No evidence of pulmonary embolism. Normal heart size. No pericardial effusion. No thoracic aortic aneurysm or dissection. Mediastinum/Nodes: No enlarged mediastinal, hilar, or axillary lymph nodes. Thyroid gland, trachea, and esophagus demonstrate no significant findings. Lungs/Pleura: Mild left and minimal right lower lobe subsegmental atelectasis. No focal consolidation, pleural effusion, or pneumothorax. No suspicious pulmonary nodule. Upper Abdomen: No acute abnormality. Musculoskeletal: No chest wall abnormality. No acute or significant osseous findings. Review of the MIP images confirms the above findings. IMPRESSION: 1. No evidence of pulmonary embolism. No acute intrathoracic process. Electronically Signed   By: Titus Dubin M.D.   On: 01/21/2021 15:13      Scheduled Meds: . amLODipine  2.5 mg Oral Daily  . multivitamin with minerals  1 tablet Oral Daily   Continuous  Infusions: . sodium chloride 30 mL/hr at 01/21/21 0337  . heparin 1,700 Units/hr (01/22/21 0345)     LOS: 2 days      Debbe Odea, MD [facility] Pager: [email or web address] 01/22/2021, 12:39 PM

## 2021-01-22 NOTE — Op Note (Signed)
[facility]   OPERATIVE NOTE   PRE-OPERATIVE DIAGNOSIS: extensive left lower extremity DVT  POST-OPERATIVE DIAGNOSIS: same   PROCEDURE: 1.   US guidance for vascular access to left popliteal vein 2.   Catheter placement into left iliac vein from left popliteal approach 3.   IVC gram and left lower extremity venogram 4.   Catheter directed thrombolysis with left popliteal superficial femoral and common femoral veins  5.   Mechanical thrombectomy to left popliteal superficial femoral and common femoral veins 6.   PTA of proximal superficial femoral vein with 8 mm x 60 mm Dorado balloon    SURGEON: Hortencia Pilar, MD  ASSISTANT(S): none  ANESTHESIA: Continuous ECG pulse oximetry and cardiopulmonary monitoring is performed throughout the entire procedure by the interventional radiology nurse total sedation time was 44 minutes.  ESTIMATED BLOOD LOSS: 50  FINDING(S): 1.   Thrombus throughout the left popliteal superficial femoral and common femoral veins.  The left external iliac and common iliac veins are widely patent as is the IVC  SPECIMEN(S):  Thrombus removed no specimen was sent  INDICATIONS:    Patient is a 75 y.o. male who presents with symptomatic left lower extremity DVT.  Patient has marked leg swelling and pain.  Venous intervention is performed to reduce the symtpoms and avoid long term postphlebitic symptoms.    DESCRIPTION: After obtaining full informed written consent, the patient was brought back to the vascular suite and placed supine upon the table. Moderate conscious sedation was administered during a face to face encounter with the patient throughout the procedure with my supervision of the RN administering medicines and monitoring the patient's vital signs, pulse oximetry, telemetry and mental status throughout from the start of the procedure until the patient was taken to the recovery room.  After obtaining adequate anesthesia, the  patient was prepped and draped in the standard fashion.    The patient was then placed into the prone position.  The left popliteal vein was then accessed under direct ultrasound guidance without difficulty with a micropuncture needle and a permanent image was recorded.  I then upsized to an 12 Fr sheath over a advantage wire.   4000 units of heparin were then given.  Imaging showed extensive DVT with minimal flow throughout the popliteal superficial femoral and common femoral veins.  A Kumpe catheter and advantage wire were then advanced into the left external iliac vein and images were performed.   I was able to cross the thrombus and stenosis and advance into the IVC which was patent.  I then used the infusion catheter and instilled 12 mg of tpa throughout the popliteal superficial femoral and common femoral veins.  After this dwelled for 30 minutes, I used the Penumbra lightening cat 12 catheter and evacuated the vast majority of the thrombus with mechanical thrombectomy throughout the CFV, SFV, and popliteal.   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  I then treated the  SFV with an 8 mm x 60 mm Dorado balloon to open a channel.   Angioplasty was to 12 atm for 1 full minute.  Follow-up imaging now demonstrated resolution of the stricture with less than 5% residual stenosis.  At this point I elected to terminate the procedure.  The sheath was removed and a dressing was placed.  She was taken to the recovery room in stable condition having tolerated the procedure well.    COMPLICATIONS: None  CONDITION: Stable  Belenda Cruise Schnier 01/22/2021 6:18 PM

## 2021-01-22 NOTE — Care Management Important Message (Signed)
Important Message  Patient Details  Name: Ronnie Bullock MRN: [id number] Date of Birth: Oct 18, 1946   Medicare Important Message Given:  N/A - LOS <3 / Initial given by admissions  Initial Medicare IM given by Malachy Moan, Patient Access Associate on 01/21/2021 at 10:12am.     Dannette Barbara 01/22/2021, 8:42 AM

## 2021-01-23 LAB — CBC
HCT: 33.2 % — ABNORMAL LOW (ref 39.0–52.0)
Hemoglobin: 11 g/dL — ABNORMAL LOW (ref 13.0–17.0)
MCH: 30 pg (ref 26.0–34.0)
MCHC: 33.1 g/dL (ref 30.0–36.0)
MCV: 90.5 fL (ref 80.0–100.0)
Platelets: 269 10*3/uL (ref 150–400)
RBC: 3.67 MIL/uL — ABNORMAL LOW (ref 4.22–5.81)
RDW: 13.9 % (ref 11.5–15.5)
WBC: 7.7 10*3/uL (ref 4.0–10.5)
nRBC: 0 % (ref 0.0–0.2)

## 2021-01-23 LAB — BASIC METABOLIC PANEL
Anion gap: 5 (ref 5–15)
BUN: 17 mg/dL (ref 8–23)
CO2: 24 mmol/L (ref 22–32)
Calcium: 7.9 mg/dL — ABNORMAL LOW (ref 8.9–10.3)
Chloride: 104 mmol/L (ref 98–111)
Creatinine, Ser: 1.04 mg/dL (ref 0.61–1.24)
GFR, Estimated: >60 mL/min (ref >60)
Glucose, Bld: 106 mg/dL — ABNORMAL HIGH (ref 70–99)
Potassium: 4 mmol/L (ref 3.5–5.1)
Sodium: 133 mmol/L — ABNORMAL LOW (ref 135–145)

## 2021-01-23 LAB — HEPARIN LEVEL (UNFRACTIONATED)
Heparin Unfractionated: <0.1 IU/mL — ABNORMAL LOW (ref 0.30–0.70)
Heparin Unfractionated: <0.1 IU/mL — ABNORMAL LOW (ref 0.30–0.70)
Heparin Unfractionated: 0.43 IU/mL (ref 0.30–0.70)

## 2021-01-23 MED ORDER — HEPARIN BOLUS VIA INFUSION
2200.0000 [IU] | Freq: Once | INTRAVENOUS | Status: AC
  Administered 2021-01-23: 2200 [IU] via INTRAVENOUS
  Filled 2021-01-23: qty 2200

## 2021-01-23 NOTE — Progress Notes (Signed)
    Subjective  - POD #1, s/p mechanical thrombectomy of left leg  Says left leg feel better   Physical Exam:  Left leg soft, no signs of phlegmasia No evidence of compartment syndrome Well perfused       Assessment/Plan:  POD #1  Transition to DOAC Keep leg elevated Needs 20-30 compression stockings  Wells Arun Herrod 01/23/2021 1:49 PM --  Vitals:   01/23/21 0352 01/23/21 1105  BP: 139/84 133/72  Pulse: 98 95  Resp: 20 20  Temp: 98.9 F (37.2 C) 97.7 F (36.5 C)  SpO2: 98% 99%    Intake/Output Summary (Last 24 hours) at 01/23/2021 1349 Last data filed at 01/23/2021 1027 Gross per 24 hour  Intake 1791.15 ml  Output 300 ml  Net 1491.15 ml     Laboratory CBC    Component Value Date/Time   WBC 7.7 01/23/2021 0221   HGB 11.0 (L) 01/23/2021 0221   HGB 13.4 11/16/2020 1517   HCT 33.2 (L) 01/23/2021 0221   HCT 39.4 11/16/2020 1517   PLT 269 01/23/2021 0221   PLT 215 11/16/2020 1517    BMET    Component Value Date/Time   NA 133 (L) 01/23/2021 0221   NA 139 09/10/2020 1441   K 4.0 01/23/2021 0221   CL 104 01/23/2021 0221   CO2 24 01/23/2021 0221   GLUCOSE 106 (H) 01/23/2021 0221   BUN 17 01/23/2021 0221   BUN 16 09/10/2020 1441   CREATININE 1.04 01/23/2021 0221   CALCIUM 7.9 (L) 01/23/2021 0221   GFRNONAA >60 01/23/2021 0221   GFRAA 84 09/10/2020 1441    COAG Lab Results  Component Value Date   INR 1.2 01/20/2021   INR 1.0 12/02/2020   No results found for: PTT  Antibiotics Anti-infectives (From admission, onward)   Start     Dose/Rate Route Frequency Ordered Stop   01/22/21 1559  ceFAZolin (ANCEF) 1-4 GM/50ML-% IVPB       Note to Pharmacy: Corlis Hove   : cabinet override      01/22/21 1559 01/23/21 0414   01/22/21 1530  ceFAZolin (ANCEF) IVPB 1 g/50 mL premix       Note to Pharmacy: To be given in specials   1 g 100 mL/hr over 30 Minutes Intravenous  Once 01/22/21 1518 01/22/21 1615   01/22/21 1520  ceFAZolin (ANCEF) 1-4  GM/50ML-% IVPB       Note to Pharmacy: Delametter, Gretchen: cabinet override      01/22/21 1520 01/23/21 0329       V. Leia Alf, M.D., [facility] [place] Office: [phone] Pager:  [phone]

## 2021-01-23 NOTE — Consult Note (Signed)
ANTICOAGULATION CONSULT NOTE  Pharmacy Consult for Heparin  Indication: DVT  Patient Measurements: Heparin Dosing Weight: 74.8 kg  Labs: Recent Labs    01/20/21 1412 01/20/21 2157 01/21/21 0542 01/21/21 1357 01/21/21 2148 01/22/21 0609 01/23/21 0221  HGB  --   --  12.3*  --   --  11.8* 11.0*  HCT  --   --  37.1*  --   --  34.8* 33.2*  PLT  --   --  220  --   --  256 269  APTT <24*  --   --   --   --   --   --   LABPROT 15.1  --   --   --   --   --   --   INR 1.2  --   --   --   --   --   --   HEPARINUNFRC  --    < > 0.37   < > 0.37 0.56 <0.10*  CREATININE  --   --  1.01  --   --  0.97 1.04   < > = values in this interval not displayed.    Estimated Creatinine Clearance: 62.4 mL/min (by C-G formula based on SCr of 1.04 mg/dL).   Medical History: Past Medical History:  Diagnosis Date  . Dementia ([place])   . Dementia ([place])   . Hyperlipidemia   . Hypertension   . Incontinence     Assessment: Pharmacy consulted to started started on heparin for new DVT. No DOAC PTA. CBC stable.   Goal of Therapy:  Heparin level 0.3-0.7 units/ml Monitor platelets by anticoagulation protocol: Yes   3/16 2157 HL < 0.10 3/17 0542 HL 0.37 3/17 1357 HL < 0.10 3/17 2148 HL 0.37 3/18 0609 HL 0.56 3/19 0221 HL < 0.1    Plan:  3/19:  HL @ 0221 = < 0.1 Spoke with RN who said heparin had been running continuously and at correct rate since restart @ ~ 1700. Will order heparin 2200 units IV X 1 bolus and increase drip rate to 2000 units/hr.  Will recheck HL 8 hrs after rate change.   Robbins,Jason D 01/23/2021,3:47 AM

## 2021-01-23 NOTE — Plan of Care (Signed)
Continuing with plan of care. 

## 2021-01-23 NOTE — Evaluation (Signed)
Physical Therapy Evaluation Patient Details Name: Ronnie Bullock MRN: [id number] DOB: Feb 26, 1946 Today's Date: 01/23/2021   History of Present Illness  Pt is a 75 y/o male with medical history dementia, prostate CA, HLD,and HTN who presents with L LE and scrotal-area swelling. Workup in ED reveals L LE DVT.  Pt had a thrombectomy on Friday 01/22/21.  The patient has been started on a heparin infusion.  Clinical Impression  Patient received by PT in the bed with the [facility] elevated alert and oriented x2. Pt agreeable to PT interventions. He was able to complete supine<>sit EOB with CGA for safety using bed rails with the HOB elevated. Pt completed sit<>stand transfer with VCs for hand placement and sequencing with minA from therapist. Pt demonstrated decreased weight bearing in LLE 2/2 to pain and soreness post operatively. Pt ambulated 4 ft EOB using RW and ModA for AD management and verbal cues for posture. Pt verbalized increased pain in the LLE with WB during ambulation, demonstrated hop to gait pattern. Therapeutic exercises completed as follows: LAQs x10 BIL, hip ADD squeezes x10 BIL, seated marches x20, standing marches with LLE with verbal and tactile cues for pace and form. Pt tolerated interventions well today with some intermittent seated rest breaks during interventions. Pt was left seated in his chair with chair alarm and call bell in reach.    Follow Up Recommendations SNF    Equipment Recommendations  Rolling walker with 5" wheels    Recommendations for Other Services       Precautions / Restrictions Precautions Precautions: Fall Restrictions Weight Bearing Restrictions: No Other Position/Activity Restrictions: Pt hesitant to place weight through the LLE due to pain s/p thrombectomy      Mobility  Bed Mobility Overal bed mobility: Needs Assistance Bed Mobility: Supine to Sit     Supine to sit: Min guard;HOB elevated     General bed mobility comments: increased time,  use of rails, cues to sequence    Transfers Overall transfer level: Needs assistance Equipment used: Rolling walker (2 wheeled) Transfers: Sit to/from Stand Sit to Stand: Min assist         General transfer comment: cues for safe use of RW, increased time  Ambulation/Gait Ambulation/Gait assistance: Mod assist Gait Distance (Feet): 4 Feet Assistive device: Standard walker Gait Pattern/deviations: Step-to pattern;Decreased weight shift to left Gait velocity: Decreased   General Gait Details: Decreased L weight shift 2/2 to pain in the L ankle with hop to gait pattern using RW  Stairs            Wheelchair Mobility    Modified Rankin (Stroke Patients Only)       Balance Overall balance assessment: Needs assistance Sitting-balance support: Feet supported;Bilateral upper extremity supported Sitting balance-Leahy Scale: Good Sitting balance - Comments: Pt able to maintain upright position safely seated EOB with BIL UE supported     Standing balance-Leahy Scale: Fair Standing balance comment: requires UE Support on RW                             Pertinent Vitals/Pain Pain Score: 2  Pain Location: LLE Pain Descriptors / Indicators: Guarding;Tender Pain Intervention(s): Limited activity within patient's tolerance;Other (comment) (Pt reports soreness when he tries to put weight on his LLE)    Home Living Family/patient expects to be discharged to:: Private residence Living Arrangements: Other relatives (Pt lives with his brother in a two story home) Available Help at Discharge: Family  Type of Home: House Home Access: Stairs to enter Entrance Stairs-Rails: Right   Home Layout: Two level Home Equipment: None      Prior Function Level of Independence: Independent         Comments: Pt is a questionable historian and had diffculty giving a straight response in regards to his living situation. He reported different number of steps in his home. He  potentially has 20 stairs to get to his 2nd floor room. Pt reports he did not do much mobility prior to hospitilization and stays mostly in the home.     Hand Dominance        Extremity/Trunk Assessment        Lower Extremity Assessment Lower Extremity Assessment: Generalized weakness;LLE deficits/detail LLE Deficits / Details: Pt with LLE wrapped from thrombectomy, pt with decreased L ankle ROM 2/2 pain LLE: Unable to fully assess due to pain       Communication      Cognition Arousal/Alertness: Awake/alert Behavior During Therapy: WFL for tasks assessed/performed Overall Cognitive Status: No family/caregiver present to determine baseline cognitive functioning                                 General Comments: Pt oriented to self and situation, but seemed confused giving various responses to his home situation during therapy. He was able to follow commands with extra time and able to understand how to use the pain scale on the white board. He required some tactile cueing during therapeutic exercises in order to complete with the appropriate form and pacing.      General Comments General comments (skin integrity, edema, etc.): LLE wrapped with no noticeable discharge present    Exercises Total Joint Exercises Ankle Circles/Pumps: AROM;Both;10 reps Towel Squeeze: AROM;Both;10 reps Long Arc Quad: AROM;Both;15 reps Marching in Standing: AROM;Left;10 reps   Assessment/Plan    PT Assessment Patient needs continued PT services  PT Problem List Decreased strength;Decreased mobility;Decreased range of motion;Decreased coordination;Decreased activity tolerance;Decreased balance       PT Treatment Interventions DME instruction;Gait training;Balance training;Therapeutic exercise;Stair training;Functional mobility training;Therapeutic activities;Patient/family education    PT Goals (Current goals can be found in the Care Plan section)  Acute Rehab PT Goals Patient  Stated Goal: Walk without pain PT Goal Formulation: With patient Time For Goal Achievement: 02/06/21 Potential to Achieve Goals: Good    Frequency Min 2X/week   Barriers to discharge Inaccessible home environment      Co-evaluation               AM-PAC PT "6 Clicks" Mobility  Outcome Measure Help needed turning from your back to your side while in a flat bed without using bedrails?: None Help needed moving from lying on your back to sitting on the side of a flat bed without using bedrails?: A Little Help needed moving to and from a bed to a chair (including a wheelchair)?: A Little Help needed standing up from a chair using your arms (e.g., wheelchair or bedside chair)?: A Little Help needed to walk in hospital room?: A Little Help needed climbing 3-5 steps with a railing? : A Lot 6 Click Score: 18    End of Session Equipment Utilized During Treatment: Gait belt Activity Tolerance: Patient limited by pain;Other (comment) (Pt limited by pain in the LLE) Patient left: in chair;with chair alarm set   PT Visit Diagnosis: Unsteadiness on feet (R26.81);Muscle weakness (generalized) (M62.81);Difficulty in walking,  not elsewhere classified (R26.2)    Time: 6010-9323 PT Time Calculation (min) (ACUTE ONLY): 23 min   Charges:   PT Evaluation $PT Eval Low Complexity: 1 Low PT Treatments $Therapeutic Activity: 8-22 mins        Duanne Guess, PT, DPT 01/23/21, 4:21 PM  Isaias Cowman 01/23/2021, 4:11 PM

## 2021-01-23 NOTE — Consult Note (Signed)
ANTICOAGULATION CONSULT NOTE  Pharmacy Consult for Heparin  Indication: DVT  Patient Measurements: Heparin Dosing Weight: 74.8 kg  Labs: Recent Labs    01/21/21 0542 01/21/21 1357 01/22/21 0609 01/23/21 0221 01/23/21 1152 01/23/21 2226  HGB 12.3*  --  11.8* 11.0*  --   --   HCT 37.1*  --  34.8* 33.2*  --   --   PLT 220  --  256 269  --   --   HEPARINUNFRC 0.37   < > 0.56 <0.10* <0.10* 0.43  CREATININE 1.01  --  0.97 1.04  --   --    < > = values in this interval not displayed.    Estimated Creatinine Clearance: 62.4 mL/min (by C-G formula based on SCr of 1.04 mg/dL).   Medical History: Past Medical History:  Diagnosis Date  . Dementia ([place])   . Dementia ([place])   . Hyperlipidemia   . Hypertension   . Incontinence     Assessment: Pharmacy consulted to started started on heparin for new DVT. No DOAC PTA. CBC stable.   Goal of Therapy:  Heparin level 0.3-0.7 units/ml Monitor platelets by anticoagulation protocol: Yes   3/16 2157 HL < 0.10 3/17 0542 HL 0.37 3/17 1357 HL < 0.10 3/17 2148 HL 0.37 3/18 0609 HL 0.56 3/19 0221 HL < 0.1  3/19 1200 HL < 0.1. called lab to obtain level.  3/19 2226 HL  0.43, therapeutic X 1    Plan:  3/19:  HL @ 2226 = 0.43 , therapeutic X 1  Will continue pt on current rate and draw confirmation level in 8 hrs on 3/20 @ 0600.    Robbins,Jason D 01/23/2021,11:34 PM

## 2021-01-23 NOTE — Consult Note (Signed)
ANTICOAGULATION CONSULT NOTE  Pharmacy Consult for Heparin  Indication: DVT  Patient Measurements: Heparin Dosing Weight: 74.8 kg  Labs: Recent Labs    01/20/21 1412 01/20/21 2157 01/21/21 0542 01/21/21 1357 01/21/21 2148 01/22/21 0609 01/23/21 0221  HGB  --    < > 12.3*  --   --  11.8* 11.0*  HCT  --   --  37.1*  --   --  34.8* 33.2*  PLT  --   --  220  --   --  256 269  APTT <24*  --   --   --   --   --   --   LABPROT 15.1  --   --   --   --   --   --   INR 1.2  --   --   --   --   --   --   HEPARINUNFRC  --    < > 0.37   < > 0.37 0.56 <0.10*  CREATININE  --   --  1.01  --   --  0.97 1.04   < > = values in this interval not displayed.    Estimated Creatinine Clearance: 62.4 mL/min (by C-G formula based on SCr of 1.04 mg/dL).   Medical History: Past Medical History:  Diagnosis Date  . Dementia ([place])   . Dementia ([place])   . Hyperlipidemia   . Hypertension   . Incontinence     Assessment: Pharmacy consulted to started started on heparin for new DVT. No DOAC PTA. CBC stable.   Goal of Therapy:  Heparin level 0.3-0.7 units/ml Monitor platelets by anticoagulation protocol: Yes   3/16 2157 HL < 0.10 3/17 0542 HL 0.37 3/17 1357 HL < 0.10 3/17 2148 HL 0.37 3/18 0609 HL 0.56 3/19 0221 HL < 0.1  3/19 1200 HL < 0.1. called lab to obtain level.    Plan:  Heparin level is subtherapeutic. Will order heparin 2200 units IV X 1 bolus and increase drip rate to 2200 units/hr. Will recheck HL 8 hrs after rate change. CBC daily while on heparin. S/p thrombectomy f/u with PO anticoagulation.   Oswald Hillock 01/23/2021,1:14 PM

## 2021-01-23 NOTE — Progress Notes (Signed)
PROGRESS NOTE    Ronnie Bullock   MMH:[id number]  DOB: 10/18/1946  DOA: 01/20/2021 PCP: [name], DO   Brief Narrative:  Ronnie Bullock is a 75 year old male with dementia, prostate cancer, hypertension and hyperlipidemia who presents with left lower extremity swelling.  He has been living with his brother lately and has been mostly sedentary.  Work-up in the ED reveals a left lower extremity DVT.  Vascular surgery was consulted and has decided on a thrombectomy this coming Friday 3/18.  The patient has been started on a heparin infusion.   Subjective: No complaints.    Assessment & Plan:   Principal Problem:   DVT (deep venous thrombosis)   -   underwent thrombectomy yesterday  Active Problems:  Fever - temp 100.5 last night- likely due to procedure yesterday, follow  Sinus tachycardia - due to d volume loss/acute blood loss (due to thrombus) - checked CT chest for PE- negative - 3/17- given NS bolus 500 cc- had 600 cc urine output the day before - 3/18 given another 500 cc bolus   - HR better today    Essential hypertension, benign - takes Amlodipine 5 mg at home which we are continuing     Prostate cancer and BPH - cont Flomax    Dementia without behavioral disturbance ([place]) - has been pleasant w/o behavioral disturbances in the hospital   Time spent in minutes: 35 DVT prophylaxis: Heparin infusion Code Status: Full code Family Communication:  Level of Care: Level of care: Med-Surg Disposition Plan:  Status is: Inpatient  Remains inpatient appropriate because:IV treatments appropriate due to intensity of illness or inability to take PO and Inpatient level of care appropriate due to severity of illness   Dispo:  Patient From: Home  Planned Disposition: Home with Health Care Svc  Medically stable for discharge: No     Consultants:   Vascular surgery Procedures:   none Antimicrobials:  Anti-infectives (From admission, onward)   Start      Dose/Rate Route Frequency Ordered Stop   01/22/21 1559  ceFAZolin (ANCEF) 1-4 GM/50ML-% IVPB       Note to Pharmacy: Corlis Hove   : cabinet override      01/22/21 1559 01/23/21 0414   01/22/21 1530  ceFAZolin (ANCEF) IVPB 1 g/50 mL premix       Note to Pharmacy: To be given in specials   1 g 100 mL/hr over 30 Minutes Intravenous  Once 01/22/21 1518 01/22/21 1615   01/22/21 1520  ceFAZolin (ANCEF) 1-4 GM/50ML-% IVPB       Note to Pharmacy: Delametter, Gretchen: cabinet override      01/22/21 1520 01/23/21 0329       Objective: Vitals:   01/22/21 1700 01/22/21 1940 01/22/21 2317 01/23/21 0352  BP: (!) 138/94 (!) 142/96 (!) 149/81 139/84  Pulse: 81 91 91 98  Resp: 19 20 20 20   Temp:  97.7 F (36.5 C) (!) 100.5 F (38.1 C) 98.9 F (37.2 C)  TempSrc:   Oral Oral  SpO2: 97% (!) 73% 100% 98%  Weight:      Height:        Intake/Output Summary (Last 24 hours) at 01/23/2021 1055 Last data filed at 01/23/2021 1027 Gross per 24 hour  Intake 1791.15 ml  Output 300 ml  Net 1491.15 ml   Filed Weights   01/20/21 1036  Weight: 74.8 kg    Examination: General exam: Appears comfortable  HEENT: PERRLA, oral mucosa moist, no sclera  icterus or thrush Respiratory system: Clear to auscultation. Respiratory effort normal. Cardiovascular system: S1 & S2 heard, regular rate and rhythm Gastrointestinal system: Abdomen soft, non-tender, nondistended. Normal bowel sounds   Central nervous system: Alert and oriented to person. No focal neurological deficits. Extremities: No cyanosis, clubbing - left leg wrapped today Skin: No rashes or ulcers Psychiatry:  Mood & affect appropriate.     Data Reviewed: I have personally reviewed following labs and imaging studies  CBC: Recent Labs  Lab 01/20/21 1129 01/21/21 0542 01/22/21 0609 01/23/21 0221  WBC 9.8 9.2 9.0 7.7  NEUTROABS 6.7  --   --   --   HGB 12.9* 12.3* 11.8* 11.0*  HCT 39.8 37.1* 34.8* 33.2*  MCV 91.9 90.5 90.4 90.5  PLT  209 220 256 035   Basic Metabolic Panel: Recent Labs  Lab 01/20/21 1129 01/21/21 0542 01/22/21 0609 01/23/21 0221  NA 138 135 135 133*  K 4.4 3.7 3.8 4.0  CL 102 103 104 104  CO2 28 25 25 24   GLUCOSE 124* 126* 111* 106*  BUN 18 17 14 17   CREATININE 1.28* 1.01 0.97 1.04  CALCIUM 8.7* 8.1* 8.3* 7.9*  MG  --  2.1  --   --   PHOS  --  2.7  --   --    GFR: Estimated Creatinine Clearance: 62.4 mL/min (by C-G formula based on SCr of 1.04 mg/dL). Liver Function Tests: Recent Labs  Lab 01/20/21 1129 01/21/21 0542  AST 23 26  ALT 12 16  ALKPHOS 67 63  BILITOT 1.6* 1.0  PROT 7.4 6.8  ALBUMIN 2.6* 2.2*   No results for input(s): LIPASE, AMYLASE in the last 168 hours. No results for input(s): AMMONIA in the last 168 hours. Coagulation Profile: Recent Labs  Lab 01/20/21 1412  INR 1.2   Cardiac Enzymes: No results for input(s): CKTOTAL, CKMB, CKMBINDEX, TROPONINI in the last 168 hours. BNP (last 3 results) No results for input(s): PROBNP in the last 8760 hours. HbA1C: No results for input(s): HGBA1C in the last 72 hours. CBG: No results for input(s): GLUCAP in the last 168 hours. Lipid Profile: No results for input(s): CHOL, HDL, LDLCALC, TRIG, CHOLHDL, LDLDIRECT in the last 72 hours. Thyroid Function Tests: No results for input(s): TSH, T4TOTAL, FREET4, T3FREE, THYROIDAB in the last 72 hours. Anemia Panel: No results for input(s): VITAMINB12, FOLATE, FERRITIN, TIBC, IRON, RETICCTPCT in the last 72 hours. Urine analysis:    Component Value Date/Time   COLORURINE YELLOW (A) 12/02/2020 1108   APPEARANCEUR CLEAR (A) 12/02/2020 1108   APPEARANCEUR Cloudy (A) 11/12/2020 1233   LABSPEC 1.019 12/02/2020 1108   PHURINE 5.0 12/02/2020 1108   GLUCOSEU NEGATIVE 12/02/2020 1108   HGBUR NEGATIVE 12/02/2020 1108   BILIRUBINUR NEGATIVE 12/02/2020 1108   BILIRUBINUR Negative 11/12/2020 [place] 12/02/2020 1108   PROTEINUR 30 (A) 12/02/2020 1108   UROBILINOGEN  0.2 10/30/2020 1403   NITRITE NEGATIVE 12/02/2020 1108   LEUKOCYTESUR NEGATIVE 12/02/2020 1108   Sepsis Labs: @LABRCNTIP (procalcitonin:4,lacticidven:4) )No results found for this or any previous visit (from the past 240 hour(s)).       Radiology Studies: CT ANGIO CHEST PE W OR WO CONTRAST  Result Date: 01/21/2021 CLINICAL DATA:  Left lower extremity DVT. EXAM: CT ANGIOGRAPHY CHEST WITH CONTRAST TECHNIQUE: Multidetector CT imaging of the chest was performed using the standard protocol during bolus administration of intravenous contrast. Multiplanar CT image reconstructions and MIPs were obtained to evaluate the vascular anatomy. CONTRAST:  58mL OMNIPAQUE  IOHEXOL 350 MG/ML SOLN COMPARISON:  Chest x-ray from yesterday. FINDINGS: Cardiovascular: Satisfactory opacification of the pulmonary arteries to the segmental level. No evidence of pulmonary embolism. Normal heart size. No pericardial effusion. No thoracic aortic aneurysm or dissection. Mediastinum/Nodes: No enlarged mediastinal, hilar, or axillary lymph nodes. Thyroid gland, trachea, and esophagus demonstrate no significant findings. Lungs/Pleura: Mild left and minimal right lower lobe subsegmental atelectasis. No focal consolidation, pleural effusion, or pneumothorax. No suspicious pulmonary nodule. Upper Abdomen: No acute abnormality. Musculoskeletal: No chest wall abnormality. No acute or significant osseous findings. Review of the MIP images confirms the above findings. IMPRESSION: 1. No evidence of pulmonary embolism. No acute intrathoracic process. Electronically Signed   By: Titus Dubin M.D.   On: 01/21/2021 15:13   PERIPHERAL VASCULAR CATHETERIZATION  Result Date: 01/22/2021 See Op Note     Scheduled Meds: . amLODipine  2.5 mg Oral Daily  . multivitamin with minerals  1 tablet Oral Daily   Continuous Infusions: . heparin 2,000 Units/hr (01/23/21 0405)     LOS: 3 days      Debbe Odea, MD [facility] Pager: [email or web address] 01/23/2021, 10:55 AM

## 2021-01-24 LAB — CBC
HCT: 33.6 % — ABNORMAL LOW (ref 39.0–52.0)
Hemoglobin: 10.7 g/dL — ABNORMAL LOW (ref 13.0–17.0)
MCH: 29.8 pg (ref 26.0–34.0)
MCHC: 31.8 g/dL (ref 30.0–36.0)
MCV: 93.6 fL (ref 80.0–100.0)
Platelets: 176 10*3/uL (ref 150–400)
RBC: 3.59 MIL/uL — ABNORMAL LOW (ref 4.22–5.81)
RDW: 14.1 % (ref 11.5–15.5)
WBC: 7.1 10*3/uL (ref 4.0–10.5)
nRBC: 0 % (ref 0.0–0.2)

## 2021-01-24 LAB — HEPARIN LEVEL (UNFRACTIONATED)
Heparin Unfractionated: <0.1 IU/mL — ABNORMAL LOW (ref 0.30–0.70)
Heparin Unfractionated: 0.42 IU/mL (ref 0.30–0.70)
Heparin Unfractionated: <0.1 IU/mL — ABNORMAL LOW (ref 0.30–0.70)

## 2021-01-24 NOTE — Plan of Care (Signed)
Continuing with plan of care. 

## 2021-01-24 NOTE — Progress Notes (Signed)
PROGRESS NOTE    Ronnie Bullock   JEH:[id number]  DOB: [birth year]  DOA: 01/20/2021 PCP: [name], DO   Brief Narrative:  Ronnie Bullock is a 75 year old male with dementia, prostate cancer, hypertension and hyperlipidemia who presents with left lower extremity swelling.  He has been living with his brother lately and has been mostly sedentary.  Work-up in the ED reveals a left lower extremity DVT.  Vascular surgery was consulted and has decided on a thrombectomy this coming Friday 3/18.  The patient has been started on a heparin infusion.   Subjective: He has no complaints today.    Assessment & Plan:   Principal Problem:   DVT (deep venous thrombosis)   -   underwent thrombectomy 3/18- cont management per vascular surgery  Active Problems:  Fever - temp 100.5 on 3/18- likely due to procedure and has not recurred  Sinus tachycardia - due to d volume loss/acute blood loss (due to thrombus) - checked CT chest for PE- negative - 3/17- given NS bolus 500 cc- had 600 cc urine output the day before - 3/18 given another 500 cc bolus   - HR has improved    Essential hypertension, benign - takes Amlodipine 5 mg at home which we are continuing     Prostate cancer and BPH - cont Flomax    Dementia without behavioral disturbance (HCC) - has been pleasant w/o behavioral disturbances in the hospital   Time spent in minutes: 35 DVT prophylaxis: Heparin infusion Code Status: Full code Family Communication: Ronnie Bullock Level of Care: Level of care: Med-Surg Disposition Plan:  Status is: Inpatient  Remains inpatient appropriate because:IV treatments appropriate due to intensity of illness or inability to take PO and Inpatient level of care appropriate due to severity of illness   Dispo:  Patient From: Home  Planned Disposition: Home with Health Care Svc- sister, Ronnie Bullock states that she and her brother will be able to stay with the patient to care for him rather than  letting him go to a SNF  Medically stable for discharge: No     Consultants:   Vascular surgery Procedures:   none Antimicrobials:  Anti-infectives (From admission, onward)   Start     Dose/Rate Route Frequency Ordered Stop   01/22/21 1559  ceFAZolin (ANCEF) 1-4 GM/50ML-% IVPB       Note to Pharmacy: Corlis Hove   : cabinet override      01/22/21 1559 01/23/21 0414   01/22/21 1530  ceFAZolin (ANCEF) IVPB 1 g/50 mL premix       Note to Pharmacy: To be given in specials   1 g 100 mL/hr over 30 Minutes Intravenous  Once 01/22/21 1518 01/22/21 1615   01/22/21 1520  ceFAZolin (ANCEF) 1-4 GM/50ML-% IVPB       Note to Pharmacy: Delametter, Gretchen: cabinet override      01/22/21 1520 01/23/21 0329       Objective: Vitals:   01/23/21 2200 01/24/21 0009 01/24/21 0422 01/24/21 1126  BP: (!) 125/92 (!) 150/82 (!) 138/92 115/83  Pulse: 70 80 78 84  Resp:  15  16  Temp: 98.5 F (36.9 C) 98.7 F (37.1 C) 97.9 F (36.6 C) 97.9 F (36.6 C)  TempSrc: Oral Oral Oral Oral  SpO2: 95% 97% 98% 97%  Weight:      Height:        Intake/Output Summary (Last 24 hours) at 01/24/2021 1134 Last data filed at 01/24/2021 1126 Gross per 24 hour  Intake 221.82 ml  Output 1504 ml  Net -1282.18 ml   Filed Weights   01/20/21 1036  Weight: 74.8 kg    Examination: General exam: Appears comfortable  HEENT: PERRLA, oral mucosa moist, no sclera icterus or thrush Respiratory system: Clear to auscultation. Respiratory effort normal. Cardiovascular system: S1 & S2 heard, regular rate and rhythm Gastrointestinal system: Abdomen soft, non-tender, nondistended. Normal bowel sounds   Central nervous system: Alert and oriented. No focal neurological deficits. Extremities: No cyanosis, clubbing or edema- left leg wrapped in ACE bandage Skin: No rashes or ulcers Psychiatry:  Mood & affect appropriate.   Data Reviewed: I have personally reviewed following labs and imaging studies  CBC: Recent  Labs  Lab 01/20/21 1129 01/21/21 0542 01/22/21 0609 01/23/21 0221 01/24/21 0534  WBC 9.8 9.2 9.0 7.7 7.1  NEUTROABS 6.7  --   --   --   --   HGB 12.9* 12.3* 11.8* 11.0* 10.7*  HCT 39.8 37.1* 34.8* 33.2* 33.6*  MCV 91.9 90.5 90.4 90.5 93.6  PLT 209 220 256 269 161   Basic Metabolic Panel: Recent Labs  Lab 01/20/21 1129 01/21/21 0542 01/22/21 0609 01/23/21 0221  NA 138 135 135 133*  K 4.4 3.7 3.8 4.0  CL 102 103 104 104  CO2 28 25 25 24   GLUCOSE 124* 126* 111* 106*  BUN 18 17 14 17   CREATININE 1.28* 1.01 0.97 1.04  CALCIUM 8.7* 8.1* 8.3* 7.9*  MG  --  2.1  --   --   PHOS  --  2.7  --   --    GFR: Estimated Creatinine Clearance: 62.4 mL/min (by C-G formula based on SCr of 1.04 mg/dL). Liver Function Tests: Recent Labs  Lab 01/20/21 1129 01/21/21 0542  AST 23 26  ALT 12 16  ALKPHOS 67 63  BILITOT 1.6* 1.0  PROT 7.4 6.8  ALBUMIN 2.6* 2.2*   No results for input(s): LIPASE, AMYLASE in the last 168 hours. No results for input(s): AMMONIA in the last 168 hours. Coagulation Profile: Recent Labs  Lab 01/20/21 1412  INR 1.2   Cardiac Enzymes: No results for input(s): CKTOTAL, CKMB, CKMBINDEX, TROPONINI in the last 168 hours. BNP (last 3 results) No results for input(s): PROBNP in the last 8760 hours. HbA1C: No results for input(s): HGBA1C in the last 72 hours. CBG: No results for input(s): GLUCAP in the last 168 hours. Lipid Profile: No results for input(s): CHOL, HDL, LDLCALC, TRIG, CHOLHDL, LDLDIRECT in the last 72 hours. Thyroid Function Tests: No results for input(s): TSH, T4TOTAL, FREET4, T3FREE, THYROIDAB in the last 72 hours. Anemia Panel: No results for input(s): VITAMINB12, FOLATE, FERRITIN, TIBC, IRON, RETICCTPCT in the last 72 hours. Urine analysis:    Component Value Date/Time   COLORURINE YELLOW (A) 12/02/2020 1108   APPEARANCEUR CLEAR (A) 12/02/2020 1108   APPEARANCEUR Cloudy (A) 11/12/2020 1233   LABSPEC 1.019 12/02/2020 1108   PHURINE  5.0 12/02/2020 1108   GLUCOSEU NEGATIVE 12/02/2020 1108   HGBUR NEGATIVE 12/02/2020 1108   BILIRUBINUR NEGATIVE 12/02/2020 1108   BILIRUBINUR Negative 11/12/2020 1233   KETONESUR NEGATIVE 12/02/2020 1108   PROTEINUR 30 (A) 12/02/2020 1108   UROBILINOGEN 0.2 10/30/2020 1403   NITRITE NEGATIVE 12/02/2020 1108   LEUKOCYTESUR NEGATIVE 12/02/2020 1108   Sepsis Labs: @LABRCNTIP (procalcitonin:4,lacticidven:4) )No results found for this or any previous visit (from the past 240 hour(s)).       Radiology Studies: PERIPHERAL VASCULAR CATHETERIZATION  Result Date: 01/22/2021 See Op Note  Scheduled Meds: . amLODipine  2.5 mg Oral Daily  . multivitamin with minerals  1 tablet Oral Daily   Continuous Infusions: . heparin 2,200 Units/hr (01/24/21 0646)     LOS: 4 days      Debbe Odea, MD [facility] Pager: [email or web address] 01/24/2021, 11:34 AM

## 2021-01-24 NOTE — Consult Note (Signed)
ANTICOAGULATION CONSULT NOTE  Pharmacy Consult for Heparin  Indication: DVT  Patient Measurements: Heparin Dosing Weight: 74.8 kg  Labs: Recent Labs    01/22/21 0609 01/23/21 0221 01/23/21 1152 01/24/21 0534 01/24/21 0759 01/24/21 1653  HGB 11.8* 11.0*  --  10.7*  --   --   HCT 34.8* 33.2*  --  33.6*  --   --   PLT 256 269  --  176  --   --   HEPARINUNFRC 0.56 <0.10*   < > <0.10* 0.42 <0.10*  CREATININE 0.97 1.04  --   --   --   --    < > = values in this interval not displayed.    Estimated Creatinine Clearance: 62.4 mL/min (by C-G formula based on SCr of 1.04 mg/dL).   Medical History: Past Medical History:  Diagnosis Date  . Dementia ([place])   . Dementia ([place])   . Hyperlipidemia   . Hypertension   . Incontinence     Assessment: Pharmacy consulted to started started on heparin for new DVT. No DOAC PTA. CBC stable.   Goal of Therapy:  Heparin level 0.3-0.7 units/ml Monitor platelets by anticoagulation protocol: Yes   3/16 2157 HL < 0.10 3/17 0542 HL 0.37 3/17 1357 HL < 0.10 3/17 2148 HL 0.37 3/18 0609 HL 0.56 3/19 0221 HL < 0.1  3/19 1200 HL < 0.1. called lab to obtain level.  3/19 2226 HL  0.43, therapeutic  3/20 0534 HL < 0.1 - ordered redrawn 3/20 0759 HL 0.42  03/20 1653 HL <0.1  This evening, heparin level is undetectable despite therapeutic level earlier this morning. Per RN, Mr. Rodriques turned his infusion pump off, and the infusion likely has been off for an extended (but unknown) period of time. RN restarted heparin at previous rate of 2200 units/hr around 1700.   Goal of Therapy:  Heparin level 0.3-0.7 units/ml Monitor platelets by anticoagulation protocol: Yes  Plan:  Continue heparin infusion at 2200 units/hr Obtain 8 hour heparin level from time the infusion was restarted Monitor CBC, daily heparin level  Continue to monitor for signs/symptoms of bleeding F/u transition to oral anticoagulant   Brendolyn Patty, PharmD Clinical  Pharmacist  01/24/2021   6:53 PM     Plan:  Heparin level is therapeutic. Will continue heparin infusion at 2200 units/hr. Recheck heparin level in 8 hours. CBC daily while on heparin. S/p thrombectomy f/u with PO anticoagulation.    Mills Koller 01/24/2021,6:52 PM

## 2021-01-24 NOTE — Consult Note (Signed)
ANTICOAGULATION CONSULT NOTE  Pharmacy Consult for Heparin  Indication: DVT  Patient Measurements: Heparin Dosing Weight: 74.8 kg  Labs: Recent Labs    01/22/21 0609 01/23/21 0221 01/23/21 1152 01/23/21 2226 01/24/21 0534 01/24/21 0759  HGB 11.8* 11.0*  --   --  10.7*  --   HCT 34.8* 33.2*  --   --  33.6*  --   PLT 256 269  --   --  176  --   HEPARINUNFRC 0.56 <0.10*   < > 0.43 <0.10* 0.42  CREATININE 0.97 1.04  --   --   --   --    < > = values in this interval not displayed.    Estimated Creatinine Clearance: 62.4 mL/min (by C-G formula based on SCr of 1.04 mg/dL).   Medical History: Past Medical History:  Diagnosis Date  . Dementia ([place])   . Dementia ([place])   . Hyperlipidemia   . Hypertension   . Incontinence     Assessment: Pharmacy consulted to started started on heparin for new DVT. No DOAC PTA. CBC stable.   Goal of Therapy:  Heparin level 0.3-0.7 units/ml Monitor platelets by anticoagulation protocol: Yes   3/16 2157 HL < 0.10 3/17 0542 HL 0.37 3/17 1357 HL < 0.10 3/17 2148 HL 0.37 3/18 0609 HL 0.56 3/19 0221 HL < 0.1  3/19 1200 HL < 0.1. called lab to obtain level.  3/19 2226 HL  0.43, therapeutic  3/20 0534 HL < 0.1 - ordered redrawn 3/20 0759 HL 0.42    Plan:  Heparin level is therapeutic. Will continue heparin infusion at 2200 units/hr. Recheck heparin level in 8 hours. CBC daily while on heparin. S/p thrombectomy f/u with PO anticoagulation.    Oswald Hillock 01/24/2021,9:52 AM

## 2021-01-25 ENCOUNTER — Encounter: Payer: Self-pay | Admitting: Vascular Surgery

## 2021-01-25 DIAGNOSIS — E7800 Pure hypercholesterolemia, unspecified: Secondary | ICD-10-CM

## 2021-01-25 LAB — CBC
HCT: 32.5 % — ABNORMAL LOW (ref 39.0–52.0)
Hemoglobin: 10.7 g/dL — ABNORMAL LOW (ref 13.0–17.0)
MCH: 30 pg (ref 26.0–34.0)
MCHC: 32.9 g/dL (ref 30.0–36.0)
MCV: 91 fL (ref 80.0–100.0)
Platelets: 326 10*3/uL (ref 150–400)
RBC: 3.57 MIL/uL — ABNORMAL LOW (ref 4.22–5.81)
RDW: 13.9 % (ref 11.5–15.5)
WBC: 6.9 10*3/uL (ref 4.0–10.5)
nRBC: 0 % (ref 0.0–0.2)

## 2021-01-25 LAB — HEPARIN LEVEL (UNFRACTIONATED): Heparin Unfractionated: 0.6 IU/mL (ref 0.30–0.70)

## 2021-01-25 MED ORDER — APIXABAN 5 MG PO TABS
10.0000 mg | ORAL_TABLET | Freq: Two times a day (BID) | ORAL | Status: DC
  Administered 2021-01-25: 10 mg via ORAL
  Filled 2021-01-25: qty 2

## 2021-01-25 MED ORDER — ADULT MULTIVITAMIN W/MINERALS CH: 1.0000 | ORAL_TABLET | Freq: Every day | ORAL | Status: DC

## 2021-01-25 MED ORDER — APIXABAN 5 MG PO TABS: 5.0000 mg | ORAL_TABLET | Freq: Two times a day (BID) | ORAL | Status: DC

## 2021-01-25 MED ORDER — APIXABAN (ELIQUIS) VTE STARTER PACK (10MG AND 5MG): ORAL_TABLET | ORAL | 0 refills | Status: DC

## 2021-01-25 MED ORDER — ACETAMINOPHEN 325 MG PO TABS: 650.0000 mg | ORAL_TABLET | Freq: Four times a day (QID) | ORAL | Status: DC | PRN

## 2021-01-25 NOTE — TOC Initial Note (Signed)
Transition of Care [facility]) - Initial/Assessment Note    Patient Details  Name: Ronnie Bullock MRN: [id number] Date of Birth: [birth year]  Transition of [facility]) CM/SW Contact:    Eileen Stanford, LCSW Phone Number: 01/25/2021, 1:24 PM  Clinical Narrative:   CSW spoke with pt's son and pt's daughter. Family will be taking pt home at d/c. Per family pt will live with his son. Pt's son is prepared to care for pt. Pt's family is agreeable to [facility] with no agency preference. Pt's son was asking about getting a bedside commode. CSW will order through [employer]. Referral for [facility] given to [facility] with [facility]. Pt' son will pick pt up at d/c.                 Expected Discharge Plan: [place] Barriers to Discharge: Continued Medical Work up   Patient Goals and CMS Choice Patient states their goals for this hospitalization and ongoing recovery are:: to get pt home   Choice offered to / list presented to : Sibling  Expected Discharge Plan and Services Expected Discharge Plan: [place] In-house Referral: NA   Post Acute Care Choice: [place] arrangements for the past 2 months: Single Family Home Expected Discharge Date: 01/25/21               DME Arranged: Bedside commode DME Agency: [employer] Date DME Agency Contacted: 01/25/21 Time DME Agency Contacted: [id number] Representative spoke with at DME Agency: [place]: RN,PT,OT,Nurse's Aide [place]: [facility] [employer] [facility] [employer] Date [place]: 01/25/21 Time [place]: 30 Representative spoke with at [place]: teresa  Prior Living Arrangements/Services Living arrangements for the past 2 months: [place] with:: Adult Children Patient language and need for interpreter reviewed:: Yes Do you feel safe going back to the place where you live?: Yes      Need for Family Participation in Patient Care: Yes (Comment) Care giver support system  in place?: Yes (comment)   Criminal Activity/Legal Involvement Pertinent to Current Situation/Hospitalization: No - Comment as needed  Activities of Daily Living   ADL Screening (condition at time of admission) Patient's cognitive ability adequate to safely complete daily activities?: No Is the patient deaf or have difficulty hearing?: No Does the patient have difficulty seeing, even when wearing glasses/contacts?: No Does the patient have difficulty concentrating, remembering, or making decisions?: Yes Patient able to express need for assistance with ADLs?: Yes Does the patient have difficulty dressing or bathing?: Yes Independently performs ADLs?: No Communication: Independent Dressing (OT): Needs assistance Grooming: Needs assistance Feeding: Independent Bathing: Needs assistance Toileting: Needs assistance In/Out Bed: Needs assistance Walks in Home: Needs assistance  Permission Sought/Granted Permission sought to share information with : Family Supports    Share Information with NAME: Linward Foster     Permission granted to share info w Relationship: son     Emotional Assessment Appearance:: Appears stated age Attitude/Demeanor/Rapport: Unable to Assess Affect (typically observed): Unable to Assess Orientation: : Oriented to Place,Oriented to  Time,Oriented to Situation,Oriented to Self Alcohol / Substance Use: Not Applicable Psych Involvement: No (comment)  Admission diagnosis:  Leg swelling [M79.89] DVT (deep venous thrombosis) (HCC) [I82.409] DVT of deep femoral vein, left ([place]) [I82.412] Patient Active Problem List   Diagnosis Date Noted  . Dementia without behavioral disturbance ([place]) 01/21/2021  . Sinus tachycardia 01/21/2021  . DVT (deep venous thrombosis) ([place]) 01/20/2021  . Scrotal swelling 11/03/2020  .  Dizziness 11/03/2020  . Dark stools 09/23/2020  . Prostate cancer ([place]) 07/03/2019  . Elevated PSA 03/28/2019  . 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 Benign  neoplasm of ascending colon   . Benign neoplasm of cecum   . Hypercholesteremia 05/02/2018  . Advanced care planning/counseling discussion 11/10/2017  . Essential hypertension, benign 10/10/2017   PCP:  [name], DO Pharmacy:   [id number] [address], [place] - [place] [address] Phone: [phone] Fax: [phone]     Social Determinants of Health (SDOH) Interventions    Readmission Risk Interventions No flowsheet data found.

## 2021-01-25 NOTE — Progress Notes (Signed)
[facility] Daily Progress Note   Subjective: 01/22/21: 1. US guidance for vascular access to left popliteal vein 2. Catheter placement into left iliac vein from left popliteal approach 3. IVC gram and left lower extremity venogram 4.   Catheter directed thrombolysis with left popliteal superficial femoral and common femoral veins  5. Mechanical thrombectomy to left popliteal superficial femoral and common femoral veins 6. PTA of proximal superficial femoral vein with 8 mm x 60 mm Dor[place] balloon  Patient without complaint this AM. No issues overnight.  Objective: Vitals:   01/25/21 0330 01/25/21 0815 01/25/21 0816 01/25/21 1112  BP: (!) 138/94 (!) 138/97 128/80 116/81  Pulse: 84 84  89  Resp: 18 18    Temp: 98 F (36.7 C) 98.1 F (36.7 C)  97.7 F (36.5 C)  TempSrc: Oral Oral    SpO2: 98% 98%  97%  Weight:      Height:        Intake/Output Summary (Last 24 hours) at 01/25/2021 1205 Last data filed at 01/25/2021 1022 Gross per 24 hour  Intake 320.46 ml  Output 801 ml  Net -480.54 ml   Physical Exam: A&Ox3, NAD CV: RRR Pulmonary: CTA Bilaterally Abdomen: Soft, Nontender, Nondistended Vascular:  Left calf access site: Procedure dressing removed.  Access site is clean dry and intact.  Left lower extremity: Thigh soft.  Calves appear extremities from distally to toes.  Good capillary refill.  Minimal edema.  Compression wrapping removed.  Motor / sensory is intact.   Laboratory: CBC    Component Value Date/Time   WBC 6.9 01/25/2021 0125   HGB 10.7 (L) 01/25/2021 0125   HGB 13.4 11/16/2020 1517   HCT 32.5 (L) 01/25/2021 0125   HCT 39.4 11/16/2020 1517   PLT 326 01/25/2021 0125   PLT 215 11/16/2020 1517   BMET    Component Value Date/Time   NA 133 (L) 01/23/2021 0221   NA 139 09/10/2020 1441   K 4.0 01/23/2021 0221   CL 104 01/23/2021 0221   CO2 24 01/23/2021 0221   GLUCOSE 106 (H) 01/23/2021 0221   BUN 17 01/23/2021 0221   BUN  16 09/10/2020 1441   CREATININE 1.04 01/23/2021 0221   CALCIUM 7.9 (L) 01/23/2021 0221   GFRNONAA >60 01/23/2021 0221   GFRAA 84 09/10/2020 1441   Assessment/Planning: The patient is a 75 year old male who presented with extensive left lower extremity DVT s/p venous thrombectomy / thrombolysis POD#3  1) Hemoglobin stable. 2) Compression wrap removed. Access site: is clean, dry, and intact. 3) Patient has been transitioned from IV heparin to PO Eliquis. 4) Will continue to surveil the patient's DVT in the outpatient setting. 5) At this point, no further recommendations from vascular at this time.  Discussed with Dr. Eber Hong Stegmayer PA-C 01/25/2021 12:05 PM

## 2021-01-25 NOTE — Progress Notes (Signed)
Patient sent out via wheelchair to waiting ride. IV removed

## 2021-01-25 NOTE — Consult Note (Signed)
[place] for transition from heparin to apixaban  Indication: DVT  Patient Measurements: Heparin Dosing Weight: 74.8 kg  Labs: Recent Labs    01/23/21 0221 01/23/21 1152 01/24/21 0534 01/24/21 0759 01/24/21 1653 01/25/21 0125  HGB 11.0*  --  10.7*  --   --  10.7*  HCT 33.2*  --  33.6*  --   --  32.5*  PLT 269  --  176  --   --  326  HEPARINUNFRC <0.10*   < > <0.10* 0.42 <0.10* 0.60  CREATININE 1.04  --   --   --   --   --    < > = values in this interval not displayed.    Estimated Creatinine Clearance: 62.4 mL/min (by C-G formula based on SCr of 1.04 mg/dL).   Medical History: Past Medical History:  Diagnosis Date  . Dementia ([place])   . Dementia ([place])   . Hyperlipidemia   . Hypertension   . Incontinence     Assessment: Pharmacy consulted to transition from heparin to apixaban for new LLE DVT s/p venous thrombectomy / thrombolysis POD#3. Heparin levels have not been consistent enough to reduce the number of full-dose treatment days with apixaban  Goal of Therapy:  Monitor platelets by anticoagulation protocol: Yes   Plan:   Stop heparin infusion  Start apixaban 10 mg twice daily for 7 days followed by 5 mg twice daily beginning one hour after infusion has stopped  CBC at least every 3 days per protocol  Dallie Piles 01/25/2021,7:02 AM

## 2021-01-25 NOTE — Consult Note (Signed)
ANTICOAGULATION CONSULT NOTE  Pharmacy Consult for Heparin  Indication: DVT  Patient Measurements: Heparin Dosing Weight: 74.8 kg  Labs: Recent Labs    01/22/21 0609 01/23/21 0221 01/23/21 1152 01/24/21 0534 01/24/21 0759 01/24/21 1653 01/25/21 0125  HGB 11.8* 11.0*  --  10.7*  --   --  10.7*  HCT 34.8* 33.2*  --  33.6*  --   --  32.5*  PLT 256 269  --  176  --   --  326  HEPARINUNFRC 0.56 <0.10*   < > <0.10* 0.42 <0.10* 0.60  CREATININE 0.97 1.04  --   --   --   --   --    < > = values in this interval not displayed.    Estimated Creatinine Clearance: 62.4 mL/min (by C-G formula based on SCr of 1.04 mg/dL).   Medical History: Past Medical History:  Diagnosis Date  . Dementia ([place])   . Dementia ([place])   . Hyperlipidemia   . Hypertension   . Incontinence     Assessment: Pharmacy consulted to started started on heparin for new DVT. No DOAC PTA. CBC stable.   Goal of Therapy:  Heparin level 0.3-0.7 units/ml Monitor platelets by anticoagulation protocol: Yes   3/16 2157 HL < 0.10 3/17 0542 HL 0.37 3/17 1357 HL < 0.10 3/17 2148 HL 0.37 3/18 0609 HL 0.56 3/19 0221 HL < 0.1  3/19 1200 HL < 0.1. called lab to obtain level.  3/19 2226 HL  0.43, therapeutic  3/20 0534 HL < 0.1 - ordered redrawn 3/20 0759 HL 0.42  3/20 1653 HL <0.1 3/21 0125 HL  0.6   This evening, heparin level is undetectable despite therapeutic level earlier this morning. Per RN, Mr. Francom turned his infusion pump off, and the infusion likely has been off for an extended (but unknown) period of time. RN restarted heparin at previous rate of 2200 units/hr around 1700.   Goal of Therapy:  Heparin level 0.3-0.7 units/ml Monitor platelets by anticoagulation protocol: Yes  Plan:  3/21:  HL @ 0125 = 0.6 Will continue pt on current rate and recheck HL on 3/21 @ 0900.   Ronnie Bullock D 01/25/2021,3:25 AM

## 2021-01-25 NOTE — Care Management Important Message (Signed)
Important Message  Patient Details  Name: Ronnie Bullock MRN: [id number] Date of Birth: 12-Jul-1946   Medicare Important Message Given:  Yes     Dannette Barbara 01/25/2021, 12:05 PM

## 2021-01-25 NOTE — Discharge Summary (Signed)
Physician Discharge Summary  Ronnie Bullock EXB:[id number] DOB: 07-03-1946 DOA: 01/20/2021  PCP: [name], DO  Admit date: 01/20/2021 Discharge date: 01/25/2021  Admitted From:home Disposition:  Home with brother and sister   Recommendations for Outpatient Follow-up:  1. F/u with vascular surgery  Home Health:  ordered  Discharge Condition:  stable   CODE STATUS:  Full code   Diet recommendation:  Heart healthy Consultations:  Vascular surgery  Procedures/Studies:  Catheter directed thrombolysis with left popliteal superficial femoral and common femoral veins   Mechanical thrombectomy to left popliteal superficial femoral and common femoral veins   PTA of proximal superficial femoral vein with 8 mm x 60 mm [place] balloon    Discharge Diagnoses:  Principal Problem:   DVT (deep venous thrombosis) ([place]) Active Problems:   Essential hypertension, benign   Hypercholesteremia   Prostate cancer (H[facility])   Dementia without behavioral disturbance (HCC)   Sinus tachycardia     Brief Summary: Ronnie Bullock is a 75 year old male with dementia, prostate cancer, hypertension and hyperlipidemia who presents with left lower extremity swelling.  He has been living with his brother lately and has been mostly sedentary.  Work-up in the ED reveals a left lower extremity DVT.  Vascular surgery was consulted and has decided on a thrombectomy this coming Friday 3/18.  The patient has been started on a heparin infusion.  Hospital Course:  Principal Problem:   DVT (deep venous thrombosis)   -   underwent thrombectomy 3/19- has been stable since - transitioned to Eliquis  Active Problems:  Fever - temp 100.5 lon 3/19 night- likely due to procedure - has not recurred  Sinus tachycardia - due to d volume loss/acute blood loss (due to thrombus) - checked CT chest for PE- negative - 3/17- given NS bolus 500 cc- had 600 cc urine output the day before - 3/18 given another 500 cc  bolus   - HR improved to normal    Essential hypertension, benign - takes Amlodipine 5 mg at home which we are continuing     Prostate cancer and BPH - cont Flomax    Dementia without behavioral disturbance ([place]) - has been pleasant w/o behavioral disturbances in the hospital   Discharge Exam: Vitals:   01/25/21 0816 01/25/21 1112  BP: 128/80 116/81  Pulse:  89  Resp:    Temp:  97.7 F (36.5 C)  SpO2:  97%   Vitals:   01/25/21 0330 01/25/21 0815 01/25/21 0816 01/25/21 1112  BP: (!) 138/94 (!) 138/97 128/80 116/81  Pulse: 84 84  89  Resp: 18 18    Temp: 98 F (36.7 C) 98.1 F (36.7 C)  97.7 F (36.5 C)  TempSrc: Oral Oral    SpO2: 98% 98%  97%  Weight:      Height:        General: Pt is alert, awake, not in acute distress Cardiovascular: RRR, S1/S2 +, no rubs, no gallops Respiratory: CTA bilaterally, no wheezing, no rhonchi Abdominal: Soft, NT, ND, bowel sounds + Extremities: no edema, no cyanosis   Discharge Instructions   Allergies as of 01/25/2021   No Known Allergies     Medication List    TAKE these medications   acetaminophen 325 MG tablet Commonly known as: TYLENOL Take 2 tablets (650 mg total) by mouth every 6 (six) hours as needed for fever, headache or mild pain.   amLODipine 5 MG tablet Commonly known as: NORVASC TAKE 1 TABLET (5 MG TOTAL) BY  MOUTH DAILY.   Apixaban Starter Pack (10mg  and 5mg ) Commonly known as: ELIQUIS STARTER PACK Take as directed on package: start with two-5mg  tablets twice daily for 7 days. On day 8, switch to one-5mg  tablet twice daily.   multivitamin with minerals Tabs tablet Take 1 tablet by mouth daily. Start taking on: January 26, 2021   polyethylene glycol powder 17 GM/SCOOP powder Commonly known as: GLYCOLAX/MIRALAX Take 17 g by mouth daily.   tamsulosin 0.4 MG Caps capsule Commonly known as: FLOMAX TAKE 1 CAPSULE BY MOUTH EVERY DAY       Follow-up Information    Schnier, Dolores Lory, MD Follow up in  1 week(s).   Specialties: Vascular Surgery, Cardiology, Radiology, Vascular Surgery Why: Seen as consult. Patient will need left lower extremity DVT study with visit. Can see Civil engineer, contracting or Arna Medici. Contact information: [place] [address] [phone]              No Known Allergies    CT ANGIO CHEST PE W OR WO CONTRAST  Result Date: 01/21/2021 CLINICAL DATA:  Left lower extremity DVT. EXAM: CT ANGIOGRAPHY CHEST WITH CONTRAST TECHNIQUE: Multidetector CT imaging of the chest was performed using the standard protocol during bolus administration of intravenous contrast. Multiplanar CT image reconstructions and MIPs were obtained to evaluate the vascular anatomy. CONTRAST:  26mL OMNIPAQUE IOHEXOL 350 MG/ML SOLN COMPARISON:  Chest x-ray from yesterday. FINDINGS: Cardiovascular: Satisfactory opacification of the pulmonary arteries to the segmental level. No evidence of pulmonary embolism. Normal heart size. No pericardial effusion. No thoracic aortic aneurysm or dissection. Mediastinum/Nodes: No enlarged mediastinal, hilar, or axillary lymph nodes. Thyroid gland, trachea, and esophagus demonstrate no significant findings. Lungs/Pleura: Mild left and minimal right lower lobe subsegmental atelectasis. No focal consolidation, pleural effusion, or pneumothorax. No suspicious pulmonary nodule. Upper Abdomen: No acute abnormality. Musculoskeletal: No chest wall abnormality. No acute or significant osseous findings. Review of the MIP images confirms the above findings. IMPRESSION: 1. No evidence of pulmonary embolism. No acute intrathoracic process. Electronically Signed   By: Titus Dubin M.D.   On: 01/21/2021 15:13   PERIPHERAL VASCULAR CATHETERIZATION  Result Date: 01/22/2021 See Op Note  US Venous Img Lower Unilateral Left  Result Date: 01/20/2021 CLINICAL DATA:  76 year old with left leg swelling. EXAM: LEFT LOWER EXTREMITY VENOUS DOPPLER ULTRASOUND TECHNIQUE: Gray-scale  sonography with graded compression, as well as color Doppler and duplex ultrasound were performed to evaluate the lower extremity deep venous systems from the level of the common femoral vein and including the common femoral, femoral, profunda femoral, popliteal and calf veins including the posterior tibial, peroneal and gastrocnemius veins when visible. The superficial great saphenous vein was also interrogated. Spectral Doppler was utilized to evaluate flow at rest and with distal augmentation maneuvers in the common femoral, femoral and popliteal veins. COMPARISON:  None. FINDINGS: Contralateral Common Femoral Vein: No thrombus. Normal compressibility and color Doppler flow. Common Femoral Vein: Partial compressibility of the left great common femoral vein near the saphenofemoral junction with nonocclusive thrombus in the left common femoral vein. Saphenofemoral Junction: Patent Profunda Femoral Vein: Positive for echogenic thrombus. Femoral Vein: Positive for thrombus. Echogenic thrombus in the left femoral vein without significant flow. Popliteal Vein: Positive for echogenic thrombus. Evidence for occlusive thrombus. Calf Veins: Positive for thrombus. No significant flow identified in the left posterior tibial or left peroneal veins. Other Findings:  None. IMPRESSION: Positive for deep venous thrombosis in the left lower extremity. Thrombus involves the distal left common femoral vein,  left femoral vein, left popliteal vein and left deep calf veins. Occlusive thrombus involving the left femoral vein, left popliteal vein and left deep calf veins. These results were called by telephone at the time of interpretation on 01/20/2021 at 12:13 pm to provider Merlyn Lot , who verbally acknowledged these results. Electronically Signed   By: Markus Daft M.D.   On: 01/20/2021 12:15   DG Chest Portable 1 View  Result Date: 01/20/2021 CLINICAL DATA:  Left lower extremity swelling. EXAM: PORTABLE CHEST 1 VIEW  COMPARISON:  None. FINDINGS: The heart size and mediastinal contours are within normal limits. Both lungs are clear. The visualized skeletal structures are unremarkable. IMPRESSION: No active disease. Electronically Signed   By: Marijo Conception M.D.   On: 01/20/2021 11:56     The results of significant diagnostics from this hospitalization (including imaging, microbiology, ancillary and laboratory) are listed below for reference.     Microbiology: No results found for this or any previous visit (from the past 240 hour(s)).   Labs: BNP (last 3 results) No results for input(s): BNP in the last 8760 hours. Basic Metabolic Panel: Recent Labs  Lab 01/20/21 1129 01/21/21 0542 01/22/21 0609 01/23/21 0221  NA 138 135 135 133*  K 4.4 3.7 3.8 4.0  CL 102 103 104 104  CO2 28 25 25 24   GLUCOSE 124* 126* 111* 106*  BUN 18 17 14 17   CREATININE 1.28* 1.01 0.97 1.04  CALCIUM 8.7* 8.1* 8.3* 7.9*  MG  --  2.1  --   --   PHOS  --  2.7  --   --    Liver Function Tests: Recent Labs  Lab 01/20/21 1129 01/21/21 0542  AST 23 26  ALT 12 16  ALKPHOS 67 63  BILITOT 1.6* 1.0  PROT 7.4 6.8  ALBUMIN 2.6* 2.2*   No results for input(s): LIPASE, AMYLASE in the last 168 hours. No results for input(s): AMMONIA in the last 168 hours. CBC: Recent Labs  Lab 01/20/21 1129 01/21/21 0542 01/22/21 0609 01/23/21 0221 01/24/21 0534 01/25/21 0125  WBC 9.8 9.2 9.0 7.7 7.1 6.9  NEUTROABS 6.7  --   --   --   --   --   HGB 12.9* 12.3* 11.8* 11.0* 10.7* 10.7*  HCT 39.8 37.1* 34.8* 33.2* 33.6* 32.5*  MCV 91.9 90.5 90.4 90.5 93.6 91.0  PLT 209 220 256 269 176 326   Cardiac Enzymes: No results for input(s): CKTOTAL, CKMB, CKMBINDEX, TROPONINI in the last 168 hours. BNP: Invalid input(s): POCBNP CBG: No results for input(s): GLUCAP in the last 168 hours. D-Dimer No results for input(s): DDIMER in the last 72 hours. Hgb A1c No results for input(s): HGBA1C in the last 72 hours. Lipid Profile No  results for input(s): CHOL, HDL, LDLCALC, TRIG, CHOLHDL, LDLDIRECT in the last 72 hours. Thyroid function studies No results for input(s): TSH, T4TOTAL, T3FREE, THYROIDAB in the last 72 hours.  Invalid input(s): FREET3 Anemia work up No results for input(s): VITAMINB12, FOLATE, FERRITIN, TIBC, IRON, RETICCTPCT in the last 72 hours. Urinalysis    Component Value Date/Time   COLORURINE YELLOW (A) 12/02/2020 1108   APPEARANCEUR CLEAR (A) 12/02/2020 1108   APPEARANCEUR Cloudy (A) 11/12/2020 1233   LABSPEC 1.019 12/02/2020 1108   PHURINE 5.0 12/02/2020 1108   GLUCOSEU NEGATIVE 12/02/2020 1108   HGBUR NEGATIVE 12/02/2020 1108   BILIRUBINUR NEGATIVE 12/02/2020 1108   BILIRUBINUR Negative 11/12/2020 [place] 12/02/2020 1108   PROTEINUR 30 (A)  12/02/2020 1108   UROBILINOGEN 0.2 10/30/2020 1403   NITRITE NEGATIVE 12/02/2020 1108   LEUKOCYTESUR NEGATIVE 12/02/2020 1108   Sepsis Labs Invalid input(s): PROCALCITONIN,  WBC,  LACTICIDVEN Microbiology No results found for this or any previous visit (from the past 240 hour(s)).   Time coordinating discharge in minutes: 65  SIGNED:   Debbe Odea, MD  [facility] 01/25/2021, 1:18 PM

## 2021-01-26 DIAGNOSIS — Z8546 Personal history of malignant neoplasm of prostate: Secondary | ICD-10-CM | POA: Diagnosis not present

## 2021-01-26 DIAGNOSIS — Z48812 Encounter for surgical aftercare following surgery on the circulatory system: Secondary | ICD-10-CM | POA: Diagnosis not present

## 2021-01-26 DIAGNOSIS — Z7901 Long term (current) use of anticoagulants: Secondary | ICD-10-CM | POA: Diagnosis not present

## 2021-01-26 DIAGNOSIS — N401 Enlarged prostate with lower urinary tract symptoms: Secondary | ICD-10-CM | POA: Diagnosis not present

## 2021-01-26 DIAGNOSIS — N39498 Other specified urinary incontinence: Secondary | ICD-10-CM | POA: Diagnosis not present

## 2021-01-26 DIAGNOSIS — N182 Chronic kidney disease, stage 2 (mild): Secondary | ICD-10-CM | POA: Diagnosis not present

## 2021-01-26 DIAGNOSIS — F0390 Unspecified dementia without behavioral disturbance: Secondary | ICD-10-CM | POA: Diagnosis not present

## 2021-01-26 DIAGNOSIS — E7800 Pure hypercholesterolemia, unspecified: Secondary | ICD-10-CM | POA: Diagnosis not present

## 2021-01-26 DIAGNOSIS — I129 Hypertensive chronic kidney disease with stage 1 through stage 4 chronic kidney disease, or unspecified chronic kidney disease: Secondary | ICD-10-CM | POA: Diagnosis not present

## 2021-02-01 DIAGNOSIS — N401 Enlarged prostate with lower urinary tract symptoms: Secondary | ICD-10-CM | POA: Diagnosis not present

## 2021-02-01 DIAGNOSIS — Z48812 Encounter for surgical aftercare following surgery on the circulatory system: Secondary | ICD-10-CM | POA: Diagnosis not present

## 2021-02-01 DIAGNOSIS — Z8546 Personal history of malignant neoplasm of prostate: Secondary | ICD-10-CM | POA: Diagnosis not present

## 2021-02-01 DIAGNOSIS — I129 Hypertensive chronic kidney disease with stage 1 through stage 4 chronic kidney disease, or unspecified chronic kidney disease: Secondary | ICD-10-CM | POA: Diagnosis not present

## 2021-02-01 DIAGNOSIS — Z7901 Long term (current) use of anticoagulants: Secondary | ICD-10-CM | POA: Diagnosis not present

## 2021-02-01 DIAGNOSIS — N39498 Other specified urinary incontinence: Secondary | ICD-10-CM | POA: Diagnosis not present

## 2021-02-01 DIAGNOSIS — E7800 Pure hypercholesterolemia, unspecified: Secondary | ICD-10-CM | POA: Diagnosis not present

## 2021-02-01 DIAGNOSIS — F0390 Unspecified dementia without behavioral disturbance: Secondary | ICD-10-CM | POA: Diagnosis not present

## 2021-02-01 DIAGNOSIS — N182 Chronic kidney disease, stage 2 (mild): Secondary | ICD-10-CM | POA: Diagnosis not present

## 2021-02-02 ENCOUNTER — Telehealth: Payer: Self-pay

## 2021-02-02 ENCOUNTER — Other Ambulatory Visit (INDEPENDENT_AMBULATORY_CARE_PROVIDER_SITE_OTHER): Payer: Self-pay | Admitting: Vascular Surgery

## 2021-02-02 DIAGNOSIS — N39498 Other specified urinary incontinence: Secondary | ICD-10-CM | POA: Diagnosis not present

## 2021-02-02 DIAGNOSIS — F0390 Unspecified dementia without behavioral disturbance: Secondary | ICD-10-CM | POA: Diagnosis not present

## 2021-02-02 DIAGNOSIS — Z48812 Encounter for surgical aftercare following surgery on the circulatory system: Secondary | ICD-10-CM | POA: Diagnosis not present

## 2021-02-02 DIAGNOSIS — I129 Hypertensive chronic kidney disease with stage 1 through stage 4 chronic kidney disease, or unspecified chronic kidney disease: Secondary | ICD-10-CM | POA: Diagnosis not present

## 2021-02-02 DIAGNOSIS — N182 Chronic kidney disease, stage 2 (mild): Secondary | ICD-10-CM | POA: Diagnosis not present

## 2021-02-02 DIAGNOSIS — E7800 Pure hypercholesterolemia, unspecified: Secondary | ICD-10-CM | POA: Diagnosis not present

## 2021-02-02 DIAGNOSIS — Z7901 Long term (current) use of anticoagulants: Secondary | ICD-10-CM | POA: Diagnosis not present

## 2021-02-02 DIAGNOSIS — N401 Enlarged prostate with lower urinary tract symptoms: Secondary | ICD-10-CM | POA: Diagnosis not present

## 2021-02-02 DIAGNOSIS — Z8546 Personal history of malignant neoplasm of prostate: Secondary | ICD-10-CM | POA: Diagnosis not present

## 2021-02-02 DIAGNOSIS — I82412 Acute embolism and thrombosis of left femoral vein: Secondary | ICD-10-CM

## 2021-02-02 NOTE — Telephone Encounter (Signed)
OK for verbal orders?

## 2021-02-02 NOTE — Telephone Encounter (Signed)
Copied from [place] [id number]. Topic: Quick Communication - Home Health Verbal Orders >> Feb 02, 2021  2:36 PM Tessa Lerner A wrote: Caller/Agency: [facility] Number: [phone]  Requesting OT/PT/Skilled Nursing/Social Work/Speech Therapy: PT  Frequency: 1w5

## 2021-02-02 NOTE — Telephone Encounter (Signed)
Routing to provider for orders.  °

## 2021-02-03 ENCOUNTER — Encounter (INDEPENDENT_AMBULATORY_CARE_PROVIDER_SITE_OTHER): Payer: Self-pay | Admitting: Nurse Practitioner

## 2021-02-03 ENCOUNTER — Other Ambulatory Visit: Payer: Self-pay

## 2021-02-03 ENCOUNTER — Telehealth: Payer: Self-pay | Admitting: *Deleted

## 2021-02-03 ENCOUNTER — Other Ambulatory Visit: Payer: Self-pay | Admitting: Family Medicine

## 2021-02-03 ENCOUNTER — Ambulatory Visit (INDEPENDENT_AMBULATORY_CARE_PROVIDER_SITE_OTHER): Payer: Medicare HMO | Admitting: Nurse Practitioner

## 2021-02-03 ENCOUNTER — Ambulatory Visit (INDEPENDENT_AMBULATORY_CARE_PROVIDER_SITE_OTHER): Payer: Medicare HMO

## 2021-02-03 VITALS — BP 105/68 | HR 75 | Resp 16 | Wt 148.0 lb

## 2021-02-03 DIAGNOSIS — I82412 Acute embolism and thrombosis of left femoral vein: Secondary | ICD-10-CM | POA: Diagnosis not present

## 2021-02-03 DIAGNOSIS — I824Y2 Acute embolism and thrombosis of unspecified deep veins of left proximal lower extremity: Secondary | ICD-10-CM | POA: Diagnosis not present

## 2021-02-03 DIAGNOSIS — E7800 Pure hypercholesterolemia, unspecified: Secondary | ICD-10-CM

## 2021-02-03 DIAGNOSIS — I10 Essential (primary) hypertension: Secondary | ICD-10-CM

## 2021-02-03 NOTE — Telephone Encounter (Signed)
Medication: polyethylene glycol powder (GLYCOLAX/MIRALAX) 17 GM/SCOOP powder  Has the pt contacted their pharmacy? No  Preferred pharmacy: [id number] - [place], [place]  Please be advised refills may take up to 3 business days.  We ask that you follow up with your pharmacy.  Brother [employer].  He is helping his brother out.  This med was on his ED discharge AVS and he wants to know if the pt should still be taking this medication?

## 2021-02-03 NOTE — Telephone Encounter (Signed)
I called brother Ronnie Bullock because he had a question about the Miralax.   Is that something he is supposed to be taking regularly and what is it for?  I let him know it was for constipation.   It's not something he would need to take daily unless he is having problems with constipation.   Per Ronnie Bullock who is helping to take care of his brother pt is not having problems with moving his bowels.   He thanked me for returning his call and answering his question.   I let him know it would remain on his medication list in case he were to need it.   Just call the pharmacy and let them know should his brother need it.   He verbalized understanding.

## 2021-02-03 NOTE — Telephone Encounter (Signed)
Scheduled 4/4 discharged 3/21

## 2021-02-03 NOTE — Telephone Encounter (Signed)
Verbal order given  

## 2021-02-04 DIAGNOSIS — Z8546 Personal history of malignant neoplasm of prostate: Secondary | ICD-10-CM | POA: Diagnosis not present

## 2021-02-04 DIAGNOSIS — N401 Enlarged prostate with lower urinary tract symptoms: Secondary | ICD-10-CM | POA: Diagnosis not present

## 2021-02-04 DIAGNOSIS — E7800 Pure hypercholesterolemia, unspecified: Secondary | ICD-10-CM | POA: Diagnosis not present

## 2021-02-04 DIAGNOSIS — Z7901 Long term (current) use of anticoagulants: Secondary | ICD-10-CM | POA: Diagnosis not present

## 2021-02-04 DIAGNOSIS — N182 Chronic kidney disease, stage 2 (mild): Secondary | ICD-10-CM | POA: Diagnosis not present

## 2021-02-04 DIAGNOSIS — F0390 Unspecified dementia without behavioral disturbance: Secondary | ICD-10-CM | POA: Diagnosis not present

## 2021-02-04 DIAGNOSIS — N39498 Other specified urinary incontinence: Secondary | ICD-10-CM | POA: Diagnosis not present

## 2021-02-04 DIAGNOSIS — Z48812 Encounter for surgical aftercare following surgery on the circulatory system: Secondary | ICD-10-CM | POA: Diagnosis not present

## 2021-02-04 DIAGNOSIS — I129 Hypertensive chronic kidney disease with stage 1 through stage 4 chronic kidney disease, or unspecified chronic kidney disease: Secondary | ICD-10-CM | POA: Diagnosis not present

## 2021-02-05 LAB — CBC
Hematocrit: 39.4 % (ref 37.5–51.0)
Hemoglobin: 13.4 g/dL (ref 13.0–17.7)
MCH: 30.2 pg (ref 26.6–33.0)
MCHC: 34 g/dL (ref 31.5–35.7)
MCV: 89 fL (ref 79–97)
Platelets: 215 10*3/uL (ref 150–450)
RBC: 4.43 x10E6/uL (ref 4.14–5.80)
RDW: 12.1 % (ref 11.6–15.4)
WBC: 4.1 10*3/uL (ref 3.4–10.8)

## 2021-02-05 LAB — IRON,TIBC AND FERRITIN PANEL
Ferritin: 168 ng/mL (ref 30–400)
Iron Saturation: 18 % (ref 15–55)
Iron: 40 ug/dL (ref 38–169)
Total Iron Binding Capacity: 224 ug/dL — ABNORMAL LOW (ref 250–450)
UIBC: 184 ug/dL (ref 111–343)

## 2021-02-07 ENCOUNTER — Encounter (INDEPENDENT_AMBULATORY_CARE_PROVIDER_SITE_OTHER): Payer: Self-pay | Admitting: Nurse Practitioner

## 2021-02-07 NOTE — Progress Notes (Signed)
Subjective:    Patient ID: Ronnie Bullock, male    DOB: 11-26-1945, 75 y.o.   MRN: [id number] Chief Complaint  Patient presents with  . Follow-up    [facility] 1 wk lle dvt ultrasound      The patient presents to the office for evaluation of DVT.  DVT was identified at [facility] by Duplex ultrasound.  The initial symptoms were pain and swelling in the lower extremity.  The patient underwent intervention on 01/20/2021 including:  PROCEDURE: 1. US guidance for vascular access to left popliteal vein 2. Catheter placement into left iliac vein from left popliteal approach 3. IVC gram and left lower extremity venogram 4.   Catheter directed thrombolysis with left popliteal superficial femoral and common femoral veins  5. Mechanical thrombectomy to left popliteal superficial femoral and common femoral veins 6. PTA of proximal superficial femoral vein with 8 mm x 60 mm [place] balloon   The patient notes the leg continues to swell with dependency however the pain is much decreased..  Symptoms are much better with elevation.  The patient notes minimal edema in the morning which steadily worsens throughout the day.    The patient has not been using compression therapy at this point.  No SOB or pleuritic chest pains.  No cough or hemoptysis.  No blood per rectum or blood in any sputum.  No excessive bruising per the patient.  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  Left lower extremity has findings consistent with thrombus including the left femoral vein popliteal vein posterior tibial veins and peroneal veins.  There is thrombus resolution post thrombectomy with recannulized flow.    Review of Systems  Respiratory: Negative for shortness of breath.   Cardiovascular: Positive for leg swelling.  All other systems reviewed and are negative.      Objective:    Physical Exam Vitals reviewed.  HENT:     Head: Normocephalic.  Cardiovascular:     Rate and Rhythm: Normal rate.     Pulses: Normal pulses.  Musculoskeletal:     Left lower leg: Edema present.  Neurological:     Mental Status: He is alert and oriented to person, place, and time.  Psychiatric:        Mood and Affect: Mood normal.        Behavior: Behavior normal.        Thought Content: Thought content normal.        Cognition and Memory: Cognition is impaired.        Judgment: Judgment normal.     BP 105/68 (BP Location: Right Arm)   Pulse 75   Resp 16   Wt 148 lb (67.1 kg)   BMI 21.24 kg/m   Past Medical History:  Diagnosis Date  . Dementia ([place])   . Dementia ([place])   . Hyperlipidemia   . Hypertension   . Incontinence     Social History   Socioeconomic History  . Marital status: Widowed    Spouse name: Not on file  . Number of children: Not on file  . Years of education: Not on file  . Highest education level: 12th grade  Occupational History  . Not on file  Tobacco Use  . Smoking status: Never Smoker  . Smokeless tobacco: Never Used  Vaping Use  . Vaping Use: Never used  Substance and Sexual Activity  . Alcohol use:  No    Alcohol/week: 0.0 standard drinks  . Drug use: No  . Sexual activity: Yes  Other Topics Concern  . Not on file  Social History Narrative  . Not on file   Social Determinants of Health   Financial Resource Strain: Not on file  Food Insecurity: Not on file  Transportation Needs: Not on file  Physical Activity: Not on file  Stress: Not on file  Social Connections: Not on file  Intimate Partner Violence: Not on file    Past Surgical History:  Procedure Laterality Date  . COLONOSCOPY WITH PROPOFOL N/A 05/16/2018   Procedure: COLONOSCOPY WITH PROPOFOL;  Surgeon: Virgel Manifold, MD;  Location: [facility] ENDOSCOPY;  Service: Endoscopy;  Laterality: N/A;  . PERIPHERAL VASCULAR THROMBECTOMY Left 01/22/2021   Procedure: PERIPHERAL  VASCULAR THROMBECTOMY / THROMBOLYSIS;  Surgeon: Katha Cabal, MD;  Location: [facility];  Service: Cardiovascular;  Laterality: Left;    Family History  Problem Relation Age of Onset  . Hypertension Mother   . Prostate cancer Neg Hx   . Bladder Cancer Neg Hx   . Kidney cancer Neg Hx     No Known Allergies  CBC Latest Ref Rng & Units 01/25/2021 01/24/2021 01/23/2021  WBC 4.0 - 10.5 K/uL 6.9 7.1 7.7  Hemoglobin 13.0 - 17.0 g/dL 10.7(L) 10.7(L) 11.0(L)  Hematocrit 39.0 - 52.0 % 32.5(L) 33.6(L) 33.2(L)  Platelets 150 - 400 K/uL 326 176 269      CMP     Component Value Date/Time   NA 133 (L) 01/23/2021 0221   NA 139 09/10/2020 1441   K 4.0 01/23/2021 0221   CL 104 01/23/2021 0221   CO2 24 01/23/2021 0221   GLUCOSE 106 (H) 01/23/2021 0221   BUN 17 01/23/2021 0221   BUN 16 09/10/2020 1441   CREATININE 1.04 01/23/2021 0221   CALCIUM 7.9 (L) 01/23/2021 0221   PROT 6.8 01/21/2021 0542   PROT 7.0 09/10/2020 1441   ALBUMIN 2.2 (L) 01/21/2021 0542   ALBUMIN 3.4 (L) 09/10/2020 1441   AST 26 01/21/2021 0542   ALT 16 01/21/2021 0542   ALKPHOS 63 01/21/2021 0542   BILITOT 1.0 01/21/2021 0542   BILITOT 0.6 09/10/2020 1441   GFRNONAA >60 01/23/2021 0221   GFRAA 84 09/10/2020 1441     No results found.     Assessment & Plan:   1. Acute deep vein thrombosis (DVT) of proximal vein of left lower extremity (HCC) Recommend:   No surgery or intervention at this point in time.  IVC filter is not indicated at present.  The patient will continue on anticoagulation   Elevation was stressed, use of a recliner was discussed.  I have had a long discussion with the patient regarding DVT and post phlebitic changes such as swelling and why it  causes symptoms such as pain.  The patient will wear graduated compression stockings class 1 (20-30 mmHg), , on a daily basis a prescription was given. The patient will  beginning wearing the stockings first thing in the morning and  removing them in the evening. The patient is instructed specifically not to sleep in the stockings.  In addition, behavioral modification including elevation during the day and avoidance of prolonged dependency will be initiated.    The patient will continue anticoagulation for now as there have not been any problems or complications at this point.  The patient will return in 6 months for reevaluation of DVT as well as discussion of cessation of anticoagulation.  -  VAS [place] LOWER EXTREMITY VENOUS (DVT); Future  2. Essential hypertension, benign Continue antihypertensive medications as already ordered, these medications have been reviewed and there are no changes at this time.   3. Hypercholesteremia Continue statin as ordered and reviewed, no changes at this time    Current Outpatient Medications on File Prior to Visit  Medication Sig Dispense Refill  . acetaminophen (TYLENOL) 325 MG tablet Take 2 tablets (650 mg total) by mouth every 6 (six) hours as needed for fever, headache or mild pain.    Marland Kitchen amLODipine (NORVASC) 5 MG tablet TAKE 1 TABLET (5 MG TOTAL) BY MOUTH DAILY. 90 tablet 0  . APIXABAN (ELIQUIS) VTE STARTER PACK (10MG  AND 5MG ) Take as directed on package: start with two-5mg  tablets twice daily for 7 days. On day 8, switch to one-5mg  tablet twice daily. 1 each 0  . Multiple Vitamin (MULTIVITAMIN WITH MINERALS) TABS tablet Take 1 tablet by mouth daily.    . polyethylene glycol powder (GLYCOLAX/MIRALAX) 17 GM/SCOOP powder Take 17 g by mouth daily. (Patient not taking: No sig reported) 500 g 0  . tamsulosin (FLOMAX) 0.4 MG CAPS capsule TAKE 1 CAPSULE BY MOUTH EVERY DAY (Patient not taking: No sig reported) 30 capsule 0   No current facility-administered medications on file prior to visit.    There are no Patient Instructions on file for this visit. No follow-ups on file.   Kris Hartmann, NP

## 2021-02-08 ENCOUNTER — Inpatient Hospital Stay: Payer: Medicare HMO | Admitting: Nurse Practitioner

## 2021-02-08 DIAGNOSIS — Z7901 Long term (current) use of anticoagulants: Secondary | ICD-10-CM | POA: Diagnosis not present

## 2021-02-08 DIAGNOSIS — Z8546 Personal history of malignant neoplasm of prostate: Secondary | ICD-10-CM | POA: Diagnosis not present

## 2021-02-08 DIAGNOSIS — F0390 Unspecified dementia without behavioral disturbance: Secondary | ICD-10-CM | POA: Diagnosis not present

## 2021-02-08 DIAGNOSIS — N182 Chronic kidney disease, stage 2 (mild): Secondary | ICD-10-CM | POA: Diagnosis not present

## 2021-02-08 DIAGNOSIS — E7800 Pure hypercholesterolemia, unspecified: Secondary | ICD-10-CM | POA: Diagnosis not present

## 2021-02-08 DIAGNOSIS — N401 Enlarged prostate with lower urinary tract symptoms: Secondary | ICD-10-CM | POA: Diagnosis not present

## 2021-02-08 DIAGNOSIS — Z48812 Encounter for surgical aftercare following surgery on the circulatory system: Secondary | ICD-10-CM | POA: Diagnosis not present

## 2021-02-08 DIAGNOSIS — N39498 Other specified urinary incontinence: Secondary | ICD-10-CM | POA: Diagnosis not present

## 2021-02-08 DIAGNOSIS — I129 Hypertensive chronic kidney disease with stage 1 through stage 4 chronic kidney disease, or unspecified chronic kidney disease: Secondary | ICD-10-CM | POA: Diagnosis not present

## 2021-02-09 ENCOUNTER — Ambulatory Visit (INDEPENDENT_AMBULATORY_CARE_PROVIDER_SITE_OTHER): Payer: Medicare HMO | Admitting: Nurse Practitioner

## 2021-02-09 ENCOUNTER — Other Ambulatory Visit: Payer: Self-pay

## 2021-02-09 ENCOUNTER — Encounter: Payer: Self-pay | Admitting: Nurse Practitioner

## 2021-02-09 VITALS — BP 116/75 | HR 98 | Temp 98.2°F | Wt 150.4 lb

## 2021-02-09 DIAGNOSIS — Z48812 Encounter for surgical aftercare following surgery on the circulatory system: Secondary | ICD-10-CM | POA: Diagnosis not present

## 2021-02-09 DIAGNOSIS — I129 Hypertensive chronic kidney disease with stage 1 through stage 4 chronic kidney disease, or unspecified chronic kidney disease: Secondary | ICD-10-CM | POA: Diagnosis not present

## 2021-02-09 DIAGNOSIS — E7800 Pure hypercholesterolemia, unspecified: Secondary | ICD-10-CM | POA: Diagnosis not present

## 2021-02-09 DIAGNOSIS — Z86718 Personal history of other venous thrombosis and embolism: Secondary | ICD-10-CM | POA: Insufficient documentation

## 2021-02-09 DIAGNOSIS — I824Y2 Acute embolism and thrombosis of unspecified deep veins of left proximal lower extremity: Secondary | ICD-10-CM

## 2021-02-09 DIAGNOSIS — N39498 Other specified urinary incontinence: Secondary | ICD-10-CM | POA: Diagnosis not present

## 2021-02-09 DIAGNOSIS — Z8546 Personal history of malignant neoplasm of prostate: Secondary | ICD-10-CM | POA: Diagnosis not present

## 2021-02-09 DIAGNOSIS — Z7901 Long term (current) use of anticoagulants: Secondary | ICD-10-CM | POA: Diagnosis not present

## 2021-02-09 DIAGNOSIS — F0390 Unspecified dementia without behavioral disturbance: Secondary | ICD-10-CM | POA: Diagnosis not present

## 2021-02-09 DIAGNOSIS — N182 Chronic kidney disease, stage 2 (mild): Secondary | ICD-10-CM | POA: Diagnosis not present

## 2021-02-09 DIAGNOSIS — N401 Enlarged prostate with lower urinary tract symptoms: Secondary | ICD-10-CM | POA: Diagnosis not present

## 2021-02-09 MED ORDER — APIXABAN 5 MG PO TABS: 5.0000 mg | ORAL_TABLET | Freq: Two times a day (BID) | ORAL | 1 refills | Status: DC

## 2021-02-09 NOTE — Progress Notes (Signed)
Established Patient Office Visit  Subjective:  Patient ID: Ronnie Bullock, male    DOB: [birth year]  Age: 75 y.o. MRN: [id number]  CC:  Chief Complaint  Patient presents with  . Hospitalization Follow-up    At [facility] 01/20/21 - 01/25/21 for DVT in LLE, surgery on 01/22/21    HPI Ronnie Bullock presents for hospital follow-up. He was hospitalized 01/20/21 for a DVT in his left lower extremity. On 01/23/21 he had a thrombectomy. He is still following-up with vascular outpatient and saw Dr. Owens Shark 02/03/21.   Transition of Care Hospital Follow up.   Hospital/Facility: [facility] D/C Physician:  Dr. Debbe Odea D/C Date: 01/25/21  Records Requested: 02/09/21 Records Received:  02/09/21 Records Reviewed:  02/09/21  Diagnoses on Discharge: DVT  Outpatient Encounter Medications as of 02/09/2021  Medication Sig  . acetaminophen (TYLENOL) 325 MG tablet Take 2 tablets (650 mg total) by mouth every 6 (six) hours as needed for fever, headache or mild pain.  Marland Kitchen amLODipine (NORVASC) 5 MG tablet TAKE 1 TABLET (5 MG TOTAL) BY MOUTH DAILY.  Marland Kitchen apixaban (ELIQUIS) 5 MG TABS tablet Take 1 tablet (5 mg total) by mouth 2 (two) times daily.  . APIXABAN (ELIQUIS) VTE STARTER PACK (10MG  AND 5MG ) Take as directed on package: start with two-5mg  tablets twice daily for 7 days. On day 8, switch to one-5mg  tablet twice daily.  . Multiple Vitamin (MULTIVITAMIN WITH MINERALS) TABS tablet Take 1 tablet by mouth daily.  . polyethylene glycol powder (GLYCOLAX/MIRALAX) 17 GM/SCOOP powder Take 17 g by mouth daily.  . tamsulosin (FLOMAX) 0.4 MG CAPS capsule TAKE 1 CAPSULE BY MOUTH EVERY DAY (Patient not taking: No sig reported)   No facility-administered encounter medications on file as of 02/09/2021.    Diagnostic Tests Reviewed/Disposition: Reviewed on chart  Consults: Vascular Surgery  Discharge Instructions Reviewed in chart and with patient today  Disease/illness Education: Educated him today  Home Health/Community  Services Discussions/Referrals: None ordered today, currently has home health nurse and PT  Establishment or re-establishment of referral orders for community resources: None  Discussion with other health care providers: Reviewed notes  Assessment and Support of treatment regimen adherence: Reviewed with patient today  Appointments Coordinated with: Reviewed vascular surgery visit note 02/03/21 with patient today  Education for self-management, independent living, and ADLs:  Reviewed with patient today  Past Medical History:  Diagnosis Date  . Dementia ([place])   . Dementia ([place])   . Hyperlipidemia   . Hypertension   . Incontinence     Past Surgical History:  Procedure Laterality Date  . COLONOSCOPY WITH PROPOFOL N/A 05/16/2018   Procedure: COLONOSCOPY WITH PROPOFOL;  Surgeon: Virgel Manifold, MD;  Location: [facility] ENDOSCOPY;  Service: Endoscopy;  Laterality: N/A;  . PERIPHERAL VASCULAR THROMBECTOMY Left 01/22/2021   Procedure: PERIPHERAL VASCULAR THROMBECTOMY / THROMBOLYSIS;  Surgeon: Katha Cabal, MD;  Location: [facility];  Service: Cardiovascular;  Laterality: Left;    Family History  Problem Relation Age of Onset  . Hypertension Mother   . Prostate cancer Neg Hx   . Bladder Cancer Neg Hx   . Kidney cancer Neg Hx     Social History   Socioeconomic History  . Marital status: Widowed    Spouse name: Not on file  . Number of children: Not on file  . Years of education: Not on file  . Highest education level: 12th grade  Occupational History  . Not on file  Tobacco Use  . Smoking  status: Never Smoker  . Smokeless tobacco: Never Used  Vaping Use  . Vaping Use: Never used  Substance and Sexual Activity  . Alcohol use: No    Alcohol/week: 0.0 standard drinks  . Drug use: No  . Sexual activity: Yes  Other Topics Concern  . Not on file  Social History Narrative  . Not on file   Social Determinants of Health   Financial Resource Strain: Not on  file  Food Insecurity: Not on file  Transportation Needs: Not on file  Physical Activity: Not on file  Stress: Not on file  Social Connections: Not on file  Intimate Partner Violence: Not on file    Outpatient Medications Prior to Visit  Medication Sig Dispense Refill  . acetaminophen (TYLENOL) 325 MG tablet Take 2 tablets (650 mg total) by mouth every 6 (six) hours as needed for fever, headache or mild pain.    Marland Kitchen amLODipine (NORVASC) 5 MG tablet TAKE 1 TABLET (5 MG TOTAL) BY MOUTH DAILY. 90 tablet 0  . APIXABAN (ELIQUIS) VTE STARTER PACK (10MG  AND 5MG ) Take as directed on package: start with two-5mg  tablets twice daily for 7 days. On day 8, switch to one-5mg  tablet twice daily. 1 each 0  . Multiple Vitamin (MULTIVITAMIN WITH MINERALS) TABS tablet Take 1 tablet by mouth daily.    . polyethylene glycol powder (GLYCOLAX/MIRALAX) 17 GM/SCOOP powder Take 17 g by mouth daily. 500 g 0  . tamsulosin (FLOMAX) 0.4 MG CAPS capsule TAKE 1 CAPSULE BY MOUTH EVERY DAY (Patient not taking: No sig reported) 30 capsule 0   No facility-administered medications prior to visit.    No Known Allergies  ROS Review of Systems  Constitutional: Negative for fatigue and fever.  Respiratory: Negative.   Cardiovascular: Negative.   Gastrointestinal: Negative.   Genitourinary: Negative.   Neurological: Negative.       Objective:    Physical Exam Vitals and nursing note reviewed.  Constitutional:      Appearance: Normal appearance.  HENT:     Head: Normocephalic.  Eyes:     Conjunctiva/sclera: Conjunctivae normal.  Cardiovascular:     Rate and Rhythm: Normal rate and regular rhythm.     Pulses: Normal pulses.     Heart sounds: Normal heart sounds.  Pulmonary:     Effort: Pulmonary effort is normal.     Breath sounds: Normal breath sounds.  Musculoskeletal:     Cervical back: Normal range of motion.     Left lower leg: Edema (1+ pitting edema to calf) present.  Skin:    General: Skin is warm  and dry.  Neurological:     Mental Status: He is alert. He is disoriented.     Comments: Has dementia  Psychiatric:        Mood and Affect: Mood normal.        Behavior: Behavior normal.     BP 116/75   Pulse 98   Temp 98.2 F (36.8 C) (Oral)   Wt 150 lb 6.4 oz (68.2 kg)   SpO2 96%   BMI 21.58 kg/m  Wt Readings from Last 3 Encounters:  02/09/21 150 lb 6.4 oz (68.2 kg)  02/03/21 148 lb (67.1 kg)  01/20/21 165 lb (74.8 kg)     Lab Results  Component Value Date   TSH 1.090 01/23/2020   Lab Results  Component Value Date   WBC 6.9 01/25/2021   HGB 10.7 (L) 01/25/2021   HCT 32.5 (L) 01/25/2021   MCV 91.0 01/25/2021  PLT 326 01/25/2021   Lab Results  Component Value Date   NA 133 (L) 01/23/2021   K 4.0 01/23/2021   CO2 24 01/23/2021   GLUCOSE 106 (H) 01/23/2021   BUN 17 01/23/2021   CREATININE 1.04 01/23/2021   BILITOT 1.0 01/21/2021   ALKPHOS 63 01/21/2021   AST 26 01/21/2021   ALT 16 01/21/2021   PROT 6.8 01/21/2021   ALBUMIN 2.2 (L) 01/21/2021   CALCIUM 7.9 (L) 01/23/2021   ANIONGAP 5 01/23/2021   Lab Results  Component Value Date   CHOL 165 09/10/2020   Lab Results  Component Value Date   HDL 65 09/10/2020   Lab Results  Component Value Date   LDLCALC 91 09/10/2020   Lab Results  Component Value Date   TRIG 41 09/10/2020      Assessment & Plan:   Problem List Items Addressed This Visit      Cardiovascular and Mediastinum   DVT (deep venous thrombosis) ([place]) - Primary    Acute DVT in left lower extremity, had thrombectomy on 01/23/21. Followed up with vascular surgery on 02/03/21. They recommended compression stockings, however patient and his brother said that they never received information on how to get them. Hard copy prescription given and instructions on where to get them. Wear in the morning and take off at night. Per vascular surgery will be on eliquis twice a day for at least 6 months. Refill for eliquis sent to the pharmacy. Will  check bmp and cbc today. Has appointment scheduled already for May, keep this appointment. Has follow-up with vascular in September. Call with any concerns.       Relevant Medications   apixaban (ELIQUIS) 5 MG TABS tablet   Other Relevant Orders   Basic Metabolic Panel (BMET)   CBC w/Diff      Meds ordered this encounter  Medications  . apixaban (ELIQUIS) 5 MG TABS tablet    Sig: Take 1 tablet (5 mg total) by mouth 2 (two) times daily.    Dispense:  180 tablet    Refill:  1    Follow-up: Return in about 1 month (around 03/11/2021) for already has appointment scheduled in May.    Charyl Dancer, NP

## 2021-02-09 NOTE — Assessment & Plan Note (Addendum)
Acute DVT in left lower extremity, had thrombectomy on 01/23/21. Followed up with vascular surgery on 02/03/21. They recommended compression stockings, however patient and his brother said that they never received information on how to get them. Hard copy prescription given and instructions on where to get them. Wear in the morning and take off at night. Per vascular surgery will be on eliquis twice a day for at least 6 months. Refill for eliquis sent to the pharmacy. Will check bmp and cbc today. Has appointment scheduled already for May, keep this appointment. Has follow-up with vascular in September. Call with any concerns.

## 2021-02-09 NOTE — Patient Instructions (Signed)
Ronnie Bullock

## 2021-02-10 DIAGNOSIS — F0390 Unspecified dementia without behavioral disturbance: Secondary | ICD-10-CM | POA: Diagnosis not present

## 2021-02-10 DIAGNOSIS — Z7901 Long term (current) use of anticoagulants: Secondary | ICD-10-CM | POA: Diagnosis not present

## 2021-02-10 DIAGNOSIS — N182 Chronic kidney disease, stage 2 (mild): Secondary | ICD-10-CM | POA: Diagnosis not present

## 2021-02-10 DIAGNOSIS — Z48812 Encounter for surgical aftercare following surgery on the circulatory system: Secondary | ICD-10-CM | POA: Diagnosis not present

## 2021-02-10 DIAGNOSIS — Z8546 Personal history of malignant neoplasm of prostate: Secondary | ICD-10-CM | POA: Diagnosis not present

## 2021-02-10 DIAGNOSIS — N401 Enlarged prostate with lower urinary tract symptoms: Secondary | ICD-10-CM | POA: Diagnosis not present

## 2021-02-10 DIAGNOSIS — N39498 Other specified urinary incontinence: Secondary | ICD-10-CM | POA: Diagnosis not present

## 2021-02-10 DIAGNOSIS — E7800 Pure hypercholesterolemia, unspecified: Secondary | ICD-10-CM | POA: Diagnosis not present

## 2021-02-10 DIAGNOSIS — I129 Hypertensive chronic kidney disease with stage 1 through stage 4 chronic kidney disease, or unspecified chronic kidney disease: Secondary | ICD-10-CM | POA: Diagnosis not present

## 2021-02-10 LAB — BASIC METABOLIC PANEL
BUN/Creatinine Ratio: 12 (ref 10–24)
BUN: 15 mg/dL (ref 8–27)
CO2: 22 mmol/L (ref 20–29)
Calcium: 8.9 mg/dL (ref 8.6–10.2)
Chloride: 104 mmol/L (ref 96–106)
Creatinine, Ser: 1.22 mg/dL (ref 0.76–1.27)
Glucose: 104 mg/dL — ABNORMAL HIGH (ref 65–99)
Potassium: 3.9 mmol/L (ref 3.5–5.2)
Sodium: 143 mmol/L (ref 134–144)
eGFR: 61 mL/min/{1.73_m2} (ref >59)

## 2021-02-10 LAB — CBC WITH DIFFERENTIAL/PLATELET
Basophils Absolute: 0 10*3/uL (ref 0.0–0.2)
Basos: 1 %
EOS (ABSOLUTE): 0.5 10*3/uL — ABNORMAL HIGH (ref 0.0–0.4)
Eos: 10 %
Hematocrit: 35.6 % — ABNORMAL LOW (ref 37.5–51.0)
Hemoglobin: 12.1 g/dL — ABNORMAL LOW (ref 13.0–17.7)
Immature Grans (Abs): 0 10*3/uL (ref 0.0–0.1)
Immature Granulocytes: 0 %
Lymphocytes Absolute: 1 10*3/uL (ref 0.7–3.1)
Lymphs: 18 %
MCH: 29.7 pg (ref 26.6–33.0)
MCHC: 34 g/dL (ref 31.5–35.7)
MCV: 87 fL (ref 79–97)
Monocytes Absolute: 0.6 10*3/uL (ref 0.1–0.9)
Monocytes: 11 %
Neutrophils Absolute: 3.3 10*3/uL (ref 1.4–7.0)
Neutrophils: 60 %
Platelets: 288 10*3/uL (ref 150–450)
RBC: 4.08 x10E6/uL — ABNORMAL LOW (ref 4.14–5.80)
RDW: 13.2 % (ref 11.6–15.4)
WBC: 5.5 10*3/uL (ref 3.4–10.8)

## 2021-02-11 ENCOUNTER — Ambulatory Visit: Payer: Medicare HMO | Admitting: Urology

## 2021-02-12 ENCOUNTER — Observation Stay
Admission: EM | Admit: 2021-02-12 | Discharge: 2021-02-16 | Disposition: A | Discharge status: Home / Self Care | Payer: Medicare HMO | Attending: Internal Medicine | Admitting: Internal Medicine

## 2021-02-12 ENCOUNTER — Encounter: Payer: Self-pay | Admitting: Intensive Care

## 2021-02-12 ENCOUNTER — Emergency Department: Payer: Medicare HMO

## 2021-02-12 ENCOUNTER — Other Ambulatory Visit: Payer: Self-pay

## 2021-02-12 DIAGNOSIS — Y9301 Activity, walking, marching and hiking: Secondary | ICD-10-CM | POA: Insufficient documentation

## 2021-02-12 DIAGNOSIS — Z8546 Personal history of malignant neoplasm of prostate: Secondary | ICD-10-CM | POA: Insufficient documentation

## 2021-02-12 DIAGNOSIS — Z20822 Contact with and (suspected) exposure to covid-19: Secondary | ICD-10-CM | POA: Diagnosis not present

## 2021-02-12 DIAGNOSIS — R748 Abnormal levels of other serum enzymes: Secondary | ICD-10-CM | POA: Insufficient documentation

## 2021-02-12 DIAGNOSIS — R58 Hemorrhage, not elsewhere classified: Secondary | ICD-10-CM | POA: Diagnosis not present

## 2021-02-12 DIAGNOSIS — W19XXXA Unspecified fall, initial encounter: Secondary | ICD-10-CM | POA: Diagnosis not present

## 2021-02-12 DIAGNOSIS — Z86010 Personal history of colonic polyps: Secondary | ICD-10-CM | POA: Diagnosis not present

## 2021-02-12 DIAGNOSIS — I10 Essential (primary) hypertension: Secondary | ICD-10-CM | POA: Insufficient documentation

## 2021-02-12 DIAGNOSIS — S0081XA Abrasion of other part of head, initial encounter: Secondary | ICD-10-CM | POA: Diagnosis not present

## 2021-02-12 DIAGNOSIS — Z7901 Long term (current) use of anticoagulants: Secondary | ICD-10-CM | POA: Insufficient documentation

## 2021-02-12 DIAGNOSIS — R778 Other specified abnormalities of plasma proteins: Secondary | ICD-10-CM | POA: Diagnosis not present

## 2021-02-12 DIAGNOSIS — I824Y2 Acute embolism and thrombosis of unspecified deep veins of left proximal lower extremity: Secondary | ICD-10-CM | POA: Diagnosis not present

## 2021-02-12 DIAGNOSIS — I2489 Other forms of acute ischemic heart disease: Secondary | ICD-10-CM

## 2021-02-12 DIAGNOSIS — I82409 Acute embolism and thrombosis of unspecified deep veins of unspecified lower extremity: Secondary | ICD-10-CM | POA: Diagnosis present

## 2021-02-12 DIAGNOSIS — S0003XA Contusion of scalp, initial encounter: Principal | ICD-10-CM

## 2021-02-12 DIAGNOSIS — Y92009 Unspecified place in unspecified non-institutional (private) residence as the place of occurrence of the external cause: Secondary | ICD-10-CM | POA: Diagnosis not present

## 2021-02-12 DIAGNOSIS — Z043 Encounter for examination and observation following other accident: Secondary | ICD-10-CM | POA: Diagnosis not present

## 2021-02-12 DIAGNOSIS — R4182 Altered mental status, unspecified: Secondary | ICD-10-CM | POA: Diagnosis not present

## 2021-02-12 DIAGNOSIS — Z79899 Other long term (current) drug therapy: Secondary | ICD-10-CM | POA: Insufficient documentation

## 2021-02-12 DIAGNOSIS — I248 Other forms of acute ischemic heart disease: Secondary | ICD-10-CM | POA: Diagnosis present

## 2021-02-12 DIAGNOSIS — S0990XA Unspecified injury of head, initial encounter: Secondary | ICD-10-CM | POA: Diagnosis present

## 2021-02-12 DIAGNOSIS — S00212A Abrasion of left eyelid and periocular area, initial encounter: Secondary | ICD-10-CM | POA: Insufficient documentation

## 2021-02-12 DIAGNOSIS — F0390 Unspecified dementia without behavioral disturbance: Secondary | ICD-10-CM | POA: Diagnosis present

## 2021-02-12 DIAGNOSIS — R0902 Hypoxemia: Secondary | ICD-10-CM | POA: Diagnosis not present

## 2021-02-12 DIAGNOSIS — R000 Tachycardia, unspecified: Secondary | ICD-10-CM | POA: Diagnosis not present

## 2021-02-12 HISTORY — DX: Other forms of acute ischemic heart disease: I24.89

## 2021-02-12 LAB — CBC
HCT: 36.9 % — ABNORMAL LOW (ref 39.0–52.0)
Hemoglobin: 12.3 g/dL — ABNORMAL LOW (ref 13.0–17.0)
MCH: 30.1 pg (ref 26.0–34.0)
MCHC: 33.3 g/dL (ref 30.0–36.0)
MCV: 90.4 fL (ref 80.0–100.0)
Platelets: 225 10*3/uL (ref 150–400)
RBC: 4.08 MIL/uL — ABNORMAL LOW (ref 4.22–5.81)
RDW: 14.6 % (ref 11.5–15.5)
WBC: 5.6 10*3/uL (ref 4.0–10.5)
nRBC: 0 % (ref 0.0–0.2)

## 2021-02-12 LAB — URINALYSIS, COMPLETE (UACMP) WITH MICROSCOPIC
Bilirubin Urine: NEGATIVE
Glucose, UA: 50 mg/dL — AB
Hgb urine dipstick: NEGATIVE
Ketones, ur: NEGATIVE mg/dL
Leukocytes,Ua: NEGATIVE
Nitrite: NEGATIVE
Protein, ur: 100 mg/dL — AB
Specific Gravity, Urine: 1.017 (ref 1.005–1.030)
pH: 5 (ref 5.0–8.0)

## 2021-02-12 LAB — BASIC METABOLIC PANEL
Anion gap: 10 (ref 5–15)
BUN: 22 mg/dL (ref 8–23)
CO2: 25 mmol/L (ref 22–32)
Calcium: 8.9 mg/dL (ref 8.9–10.3)
Chloride: 100 mmol/L (ref 98–111)
Creatinine, Ser: 1.26 mg/dL — ABNORMAL HIGH (ref 0.61–1.24)
GFR, Estimated: 59 mL/min — ABNORMAL LOW (ref >60)
Glucose, Bld: 113 mg/dL — ABNORMAL HIGH (ref 70–99)
Potassium: 3.9 mmol/L (ref 3.5–5.1)
Sodium: 135 mmol/L (ref 135–145)

## 2021-02-12 LAB — TROPONIN I (HIGH SENSITIVITY)
Troponin I (High Sensitivity): 20 ng/L — ABNORMAL HIGH (ref <18)
Troponin I (High Sensitivity): 35 ng/L — ABNORMAL HIGH (ref <18)
Troponin I (High Sensitivity): 46 ng/L — ABNORMAL HIGH (ref <18)

## 2021-02-12 MED ORDER — SODIUM CHLORIDE 0.9 % IV SOLN: INTRAVENOUS | Status: AC

## 2021-02-12 MED ORDER — AMLODIPINE BESYLATE 5 MG PO TABS
5.0000 mg | ORAL_TABLET | Freq: Every day | ORAL | Status: DC
  Administered 2021-02-13 – 2021-02-16 (×4): 5 mg via ORAL
  Filled 2021-02-12 (×4): qty 1

## 2021-02-12 MED ORDER — ACETAMINOPHEN 325 MG PO TABS: 650.0000 mg | ORAL_TABLET | Freq: Four times a day (QID) | ORAL | Status: DC | PRN

## 2021-02-12 MED ORDER — SODIUM CHLORIDE 0.9% FLUSH
3.0000 mL | Freq: Two times a day (BID) | INTRAVENOUS | Status: DC
  Administered 2021-02-13 – 2021-02-16 (×8): 3 mL via INTRAVENOUS

## 2021-02-12 MED ORDER — ACETAMINOPHEN 650 MG RE SUPP: 650.0000 mg | Freq: Four times a day (QID) | RECTAL | Status: DC | PRN

## 2021-02-12 MED ORDER — POLYETHYLENE GLYCOL 3350 17 GM/SCOOP PO POWD
17.0000 g | Freq: Every day | ORAL | Status: DC
  Filled 2021-02-12: qty 255

## 2021-02-12 NOTE — ED Provider Notes (Signed)
[facility] Emergency Department Provider Note   ____________________________________________   I have reviewed the triage vital signs and the nursing notes.   HISTORY  Chief Complaint Fall and Altered Mental Status   History limited by and level 5 caveat due to: Dementia   HPI Ronnie Bullock is a 75 y.o. male who presents to the emergency department today because of concern for a fall. The patient is unable to give a good history of what happened however states that he was coming back from the pharmacy when he fell. Unclear if it was truly a mechanical fall. The patient denies any pain at the time of my exam.   Records reviewed. Per medical record review patient has a history of dementia.   Past Medical History:  Diagnosis Date  . Dementia ([place])   . Dementia ([place])   . Hyperlipidemia   . Hypertension   . Incontinence     Patient Active Problem List   Diagnosis Date Noted  . Dementia without behavioral disturbance ([place]) 01/21/2021  . Sinus tachycardia 01/21/2021  . DVT (deep venous thrombosis) ([place]) 01/20/2021  . Scrotal swelling 11/03/2020  . Dizziness 11/03/2020  . Dark stools 09/23/2020  . Prostate cancer ([place]) 07/03/2019  . Elevated PSA 03/28/2019  . 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 Benign neoplasm of ascending colon   . Benign neoplasm of cecum   . Hypercholesteremia 05/02/2018  . Advanced care planning/counseling discussion 11/10/2017  . Essential hypertension, benign 10/10/2017    Past Surgical History:  Procedure Laterality Date  . COLONOSCOPY WITH PROPOFOL N/A 05/16/2018   Procedure: COLONOSCOPY WITH PROPOFOL;  Surgeon: Virgel Manifold, MD;  Location: [facility] ENDOSCOPY;  Service: Endoscopy;  Laterality: N/A;  . PERIPHERAL VASCULAR THROMBECTOMY Left 01/22/2021   Procedure: PERIPHERAL VASCULAR THROMBECTOMY / THROMBOLYSIS;  Surgeon: Katha Cabal, MD;  Location: [facility] [place];  Service: Cardiovascular;  Laterality: Left;     Prior to Admission medications   Medication Sig Start Date End Date Taking? Authorizing Provider  acetaminophen (TYLENOL) 325 MG tablet Take 2 tablets (650 mg total) by mouth every 6 (six) hours as needed for fever, headache or mild pain. 01/25/21   Debbe Odea, MD  amLODipine (NORVASC) 5 MG tablet TAKE 1 TABLET (5 MG TOTAL) BY MOUTH DAILY. 01/22/21 04/22/21  Cannady, Henrine Screws T, NP  apixaban (ELIQUIS) 5 MG TABS tablet Take 1 tablet (5 mg total) by mouth 2 (two) times daily. 02/09/21   McElwee, Scheryl Darter, NP  APIXABAN (ELIQUIS) VTE STARTER PACK (10MG  AND 5MG ) Take as directed on package: start with two-5mg  tablets twice daily for 7 days. On day 8, switch to one-5mg  tablet twice daily. 01/25/21   Debbe Odea, MD  Multiple Vitamin (MULTIVITAMIN WITH MINERALS) TABS tablet Take 1 tablet by mouth daily. 01/26/21   Debbe Odea, MD  polyethylene glycol powder (GLYCOLAX/MIRALAX) 17 GM/SCOOP powder Take 17 g by mouth daily. 09/23/20   Myles Gip, DO  tamsulosin (FLOMAX) 0.4 MG CAPS capsule TAKE 1 CAPSULE BY MOUTH EVERY DAY Patient not taking: No sig reported 12/07/20   Abbie Sons, MD    Allergies Patient has no known allergies.  Family History  Problem Relation Age of Onset  . Hypertension Mother   . Prostate cancer Neg Hx   . Bladder Cancer Neg Hx   . Kidney cancer Neg Hx     Social History Social History   Tobacco Use  . Smoking status: Never Smoker  . Smokeless tobacco: Never Used  Vaping  Use  . Vaping Use: Never used  Substance Use Topics  . Alcohol use: No    Alcohol/week: 0.0 standard drinks  . Drug use: No    Review of Systems Unable to obtain reliable ROS secondary to dementia/AMS. ____________________________________________   PHYSICAL EXAM:  VITAL SIGNS: ED Triage Vitals  Enc Vitals Group     BP 02/12/21 1647 119/90     Pulse Rate 02/12/21 1647 (!) 113     Resp 02/12/21 1647 18     Temp 02/12/21 1647 97.7 F (36.5 C)     Temp Source 02/12/21 1647  Oral     SpO2 02/12/21 1647 93 %     Weight 02/12/21 1648 150 lb (68 kg)     Height 02/12/21 1648 5\' 11"  (1.803 m)     Head Circumference --      Peak Flow --      Pain Score 02/12/21 1648 0   Constitutional: Awake and alert. Not completely oriented.  Eyes: Conjunctivae are normal.  ENT      Head: Normocephalic. Non tender over area of abrasion to left check.      Nose: No congestion/rhinnorhea.      Mouth/Throat: Mucous membranes are moist.      Neck: No stridor. Hematological/Lymphatic/Immunilogical: No cervical lymphadenopathy. Cardiovascular: Normal rate, regular rhythm.  No murmurs, rubs, or gallops.  Respiratory: Normal respiratory effort without tachypnea nor retractions. Breath sounds are clear and equal bilaterally. No wheezes/rales/rhonchi. Gastrointestinal: Soft and non tender. No rebound. No guarding.  Genitourinary: Deferred Musculoskeletal: Normal range of motion in all extremities. No lower extremity edema. Neurologic:  Normal speech and language. No gross focal neurologic deficits are appreciated.  Skin:  Abrasions to left cheek and forehead.  Psychiatric: Mood and affect are normal. Speech and behavior are normal. Patient exhibits appropriate insight and judgment.  ____________________________________________    LABS (pertinent positives/negatives)  BMP na 135, k 3.9, glu 113, cr 1.26 Trop 20 -> 35-> 46 CBC wbc 5.6, hgb 12.3, plt 225  ____________________________________________   EKG  I, Nance Pear, attending physician, personally viewed and interpreted this EKG  EKG Time: 1746 Rate: 96 Rhythm: sinus rhythm with pac Axis: normal Intervals: qtc 464 QRS: narrow ST changes: no st elevation Impression: abnormal ekg  ____________________________________________    RADIOLOGY  CT head/cervical spine No acute osseous abnormality. No intracranial bleed. Hematoma.    ____________________________________________   PROCEDURES  Procedures  ____________________________________________   INITIAL IMPRESSION / ASSESSMENT AND PLAN / ED COURSE  Pertinent labs & imaging results that were available during my care of the patient were reviewed by me and considered in my medical decision making (see chart for details).   Patient presented to the emergency department today after suffering a fall. The patient did have abrasions to the left check and left eyebrow. No underlying tenderness. CT head/cervical spine without acute osseous injury or intracranial bleed. Given slightly unclear etiology of the fall blood work was checked. Trop was initially slightly elevated and thus was repeated. Troponin continued to elevate. At this time while I do have low suspicion for ACS will plan on admission for further work up and monitoring.  ____________________________________________   FINAL CLINICAL IMPRESSION(S) / ED DIAGNOSES  Final diagnoses:  Fall, initial encounter  Elevated troponin     Note: This dictation was prepared with Diplomatic Services operational officer dictation. Any transcriptional errors that result from this process are unintentional     Nance Pear, MD 02/13/21 1546

## 2021-02-12 NOTE — ED Triage Notes (Signed)
First RN Note: pt to ED via [facility], per EMS pt with [facility], per EMS abrasions to head, controlled with pressure applied PTA. Per EMS pt also with wet sounding cough that started after fall. Per EMS pt also on blood thinners at this time.

## 2021-02-12 NOTE — H&P (Signed)
History and Physical   Ronnie Bullock QQP:[id number] DOB: 11/30/1945 DOA: 02/12/2021  PCP: [name], DO   Patient coming from: Home  Chief Complaint: Fall, hematoma  HPI: Ronnie Bullock is a 75 y.o. male with medical history significant of benign neoplasm of colon, dementia, orthostatic hypotension, hypertension, hyperlipidemia, prostate cancer, recent admission for DVT who presents following a fall.  Some history obtained with assistance of family or chart review due to patient's history of dementia.  Patient was walking quickly almost jogging to the drugstore with his brother when he had a fall.  Patient states he does not think he tripped on anything nor does his brother.  He denies lightheadedness prior to fall.  His brother states that the surface that he was on was a little uneven and may have contributed to the fall.   He did hit his head and arrived with a hematoma on the left side.  He is currently on blood thinners following a recent admission in March for DVT and thrombectomy.  No PE during that admission. EMS reports that patient did have a wet cough noted in route, none noted when I saw the patient.  Patient denies fever, chills, chest pain, shortness breath, dumping, constipation, diarrhea, nausea, vomiting.  He does continue to have left-sided swelling and his brother was wondering if maybe the ankle swelling affected by his walk but patient thinks it has not been affecting his walk.  ED Course: Vital signs in ED significant for heart rate initially in the 110s which improved to the 90s.  Lab work-up showed BMP with creatinine 1.26 of her baseline around 1, glucose 113.  CBC showed hemoglobin around 12.3 which is stable for him.  Troponin trend in the ED was 20 then 35 then 46.  Respiratory flu COVID's pending.  CT head showed left frontal supraorbital scalp hematoma, no intracranial abnormality, no fracture.  CT C-spine showed no acute outer maladies but did demonstrate  degenerative disc disease.   Review of Systems: As per HPI otherwise all other systems reviewed and are negative.  Past Medical History:  Diagnosis Date  . Dementia ([place])   . Dementia ([place])   . Hyperlipidemia   . Hypertension   . Incontinence     Past Surgical History:  Procedure Laterality Date  . COLONOSCOPY WITH PROPOFOL N/A 05/16/2018   Procedure: COLONOSCOPY WITH PROPOFOL;  Surgeon: Virgel Manifold, MD;  Location: [facility];  Service: Endoscopy;  Laterality: N/A;  . PERIPHERAL VASCULAR THROMBECTOMY Left 01/22/2021   Procedure: PERIPHERAL VASCULAR THROMBECTOMY / THROMBOLYSIS;  Surgeon: Katha Cabal, MD;  Location: [facility];  Service: Cardiovascular;  Laterality: Left;    Social History  reports that he has never smoked. He has never used smokeless tobacco. He reports that he does not drink alcohol and does not use drugs.  No Known Allergies  Family History  Problem Relation Age of Onset  . Hypertension Mother   . Prostate cancer Neg Hx   . Bladder Cancer Neg Hx   . Kidney cancer Neg Hx   Reviewed on admission  Prior to Admission medications   Medication Sig Start Date End Date Taking? Authorizing Provider  acetaminophen (TYLENOL) 325 MG tablet Take 2 tablets (650 mg total) by mouth every 6 (six) hours as needed for fever, headache or mild pain. 01/25/21   Debbe Odea, MD  amLODipine (NORVASC) 5 MG tablet TAKE 1 TABLET (5 MG TOTAL) BY MOUTH DAILY. 01/22/21 04/22/21  Venita Lick,  NP  apixaban (ELIQUIS) 5 MG TABS tablet Take 1 tablet (5 mg total) by mouth 2 (two) times daily. 02/09/21   McElwee, Scheryl Darter, NP  APIXABAN (ELIQUIS) VTE STARTER PACK (10MG  AND 5MG ) Take as directed on package: start with two-5mg  tablets twice daily for 7 days. On day 8, switch to one-5mg  tablet twice daily. 01/25/21   Debbe Odea, MD  Multiple Vitamin (MULTIVITAMIN WITH MINERALS) TABS tablet Take 1 tablet by mouth daily. 01/26/21   Debbe Odea, MD  polyethylene  glycol powder (GLYCOLAX/MIRALAX) 17 GM/SCOOP powder Take 17 g by mouth daily. 09/23/20   Myles Gip, DO  tamsulosin (FLOMAX) 0.4 MG CAPS capsule TAKE 1 CAPSULE BY MOUTH EVERY DAY Patient not taking: No sig reported 12/07/20   Abbie Sons, MD    Physical Exam: Vitals:   02/12/21 1647 02/12/21 1648 02/12/21 1830  BP: 119/90  130/84  Pulse: (!) 113  92  Resp: 18  16  Temp: 97.7 F (36.5 C)  97.7 F (36.5 C)  TempSrc: Oral  Oral  SpO2: 93%  96%  Weight:  68 kg   Height:  5\' 11"  (1.803 m)    Physical Exam Constitutional:      General: He is not in acute distress.    Appearance: Normal appearance.  HENT:     Head:     Comments: Left frontotemporal Hematoma with periorbital swelling    Mouth/Throat:     Mouth: Mucous membranes are moist.     Pharynx: Oropharynx is clear.  Eyes:     Extraocular Movements: Extraocular movements intact.     Pupils: Pupils are equal, round, and reactive to light.  Cardiovascular:     Rate and Rhythm: Normal rate and regular rhythm.     Pulses: Normal pulses.     Heart sounds: Normal heart sounds.  Pulmonary:     Effort: Pulmonary effort is normal. No respiratory distress.     Breath sounds: Normal breath sounds.  Abdominal:     General: Bowel sounds are normal. There is no distension.     Palpations: Abdomen is soft.     Tenderness: There is no abdominal tenderness.  Musculoskeletal:        General: No swelling or deformity.  Skin:    General: Skin is warm and dry.  Neurological:     General: No focal deficit present.     Mental Status: Mental status is at baseline.     Comments: Alert and oriented to person and place but not year    Labs on Admission: I have personally reviewed following labs and imaging studies  CBC: Recent Labs  Lab 02/09/21 1055 02/12/21 1733  WBC 5.5 5.6  NEUTROABS 3.3  --   HGB 12.1* 12.3*  HCT 35.6* 36.9*  MCV 87 90.4  PLT 288 161    Basic Metabolic Panel: Recent Labs  Lab 02/09/21 1055  02/12/21 1733  NA 143 135  K 3.9 3.9  CL 104 100  CO2 22 25  GLUCOSE 104* 113*  BUN 15 22  CREATININE 1.22 1.26*  CALCIUM 8.9 8.9    GFR: Estimated Creatinine Clearance: 48 mL/min (A) (by C-G formula based on SCr of 1.26 mg/dL (H)).  Liver Function Tests: No results for input(s): AST, ALT, ALKPHOS, BILITOT, PROT, ALBUMIN in the last 168 hours.  Urine analysis:    Component Value Date/Time   COLORURINE YELLOW (A) 02/12/2021 1658   APPEARANCEUR HAZY (A) 02/12/2021 1658   APPEARANCEUR Cloudy (A) 11/12/2020 1233  LABSPEC 1.017 02/12/2021 1658   PHURINE 5.0 02/12/2021 1658   GLUCOSEU 50 (A) 02/12/2021 1658   HGBUR NEGATIVE 02/12/2021 [place] 02/12/2021 1658   BILIRUBINUR Negative 11/12/2020 1233   KETONESUR NEGATIVE 02/12/2021 1658   PROTEINUR 100 (A) 02/12/2021 1658   UROBILINOGEN 0.2 10/30/2020 1403   NITRITE NEGATIVE 02/12/2021 1658   LEUKOCYTESUR NEGATIVE 02/12/2021 1658    Radiological Exams on Admission: CT Head Wo Contrast  Result Date: 02/12/2021 CLINICAL DATA:  [facility]  Abrasions to head. EXAM: CT HEAD WITHOUT CONTRAST TECHNIQUE: Contiguous axial images were obtained from the base of the skull through the vertex without intravenous contrast. COMPARISON:  Head CT 12/02/2020 FINDINGS: Brain: Stable degree of atrophy and chronic small vessel ischemia. No intracranial hemorrhage, mass effect, or midline shift. No hydrocephalus. The basilar cisterns are patent. Chronic low-density involving left basal ganglia and inferior frontal lobe likely sequela of chronic ischemia, stable from prior. No evidence of territorial infarct or acute ischemia. No extra-axial or intracranial fluid collection. Vascular: Atherosclerosis of skullbase vasculature without hyperdense vessel or abnormal calcification. Skull: No fracture or focal lesion. Sinuses/Orbits: Left supraorbital scalp hematoma. Chronic opacification of the included paranasal sinuses, stable in  appearance from prior. No mastoid effusion. Other: Left frontal/supraorbital scalp hematoma. IMPRESSION: 1. Left frontal/supraorbital scalp hematoma. No acute intracranial abnormality. No skull fracture. 2. Stable atrophy and chronic ischemia. 3. Chronic sinusitis. Electronically Signed   By: Keith Rake M.D.   On: 02/12/2021 17:32   CT Cervical Spine Wo Contrast  Result Date: 02/12/2021 CLINICAL DATA:  [facility] EXAM: CT CERVICAL SPINE WITHOUT CONTRAST TECHNIQUE: Multidetector CT imaging of the cervical spine was performed without intravenous contrast. Multiplanar CT image reconstructions were also generated. COMPARISON:  None. FINDINGS: Alignment: Broad-based reversal of normal lordosis. No traumatic subluxation. Skull base and vertebrae: No acute fracture. Vertebral body heights are maintained. The dens and skull base are intact. Soft tissues and spinal canal: No prevertebral fluid or swelling. No visible canal hematoma. Disc levels: Diffuse degenerative disc disease with disc space narrowing and endplate spurring. No high-grade canal stenosis. Upper chest: Patulous upper esophagus containing intraluminal fluid is well as retained secretions in the pharynx. No apical pneumothorax or rib fracture. Other: None. IMPRESSION: 1. No acute fracture or subluxation of the cervical spine. 2. Broad-based reversal of normal lordosis may be due to positioning or muscle spasm. 3. Diffuse degenerative disc disease. 4. Patulous upper esophagus containing intraluminal fluid is well as retained secretions in the pharynx. Electronically Signed   By: Keith Rake M.D.   On: 02/12/2021 17:36    EKG: Independently reviewed.  Sinus rhythm at 96 bpm.  PAC noted.  Assessment/Plan Active Problems:   DVT (deep venous thrombosis) (HCC)   Dementia without behavioral disturbance (HCC)   Demand ischemia ([place])   Fall at home, initial encounter   Scalp hematoma, initial encounter  Demand ischemia >  Troponin elevated in ED 220 and continued to increase to 35 and then 46 on repeat. > Denies chest pain or shortness of breath. > Suspect demand ischemia related to possible dehydration and recent fall (was noted to be tachycardic on arrival). - Monitor on telemetry - Continue to trend troponin to peak/plateau  Fall Hematoma Hx of Orthostasis > Patient presenting after fall when walking fast to pharmacy he did hit his head and he is currently on Eliquis for recent DVT.  No clear description of him actually driving home. > No intracranial bleeding or  cranial fracture on CT, hematoma was noted.  DDD noted on CT C-spine but no acute abnormalities. > We will hold anticoagulation for today > Patient does have a history of positive orthostatic vital signs, which may be contributing as there is no clear history of him tripping. - Reevaluate for resumption of anticoagulation tomorrow if hematoma remains stable - PT eval and treat - Recheck orthostatic vital signs - Trend CBC  Dehydration > Creatinine mildly elevated to 1.26 from baseline around 1 this does not quite meet criteria for AKI but he does likely have a degree of dehydration. > He was tachycardic on arrival could be related to pain in his injury from his fall however also may be related to dehydration as his heart rate has responded to hydration in the past. - We will give IV fluids overnight - Trend renal function  Dementia > Pleasantly demented  History of DVT > Recent admission for this in March is status post thrombectomy and on Eliquis. > Holding anticoagulation tonight as above - We will reevaluate for resumption tomorrow provided hematoma is stable  DVT prophylaxis: SCDs  Code Status:   Full  Family Communication:  Brother updated at bedside Disposition Plan:   Patient is from:  Home  Anticipated DC to:  Home  Anticipated DC date:  1 to 2 days  Anticipated DC barriers: None  Consults called:  None  Admission status:   Observation, telemetry   Severity of Illness: The appropriate patient status for this patient is OBSERVATION. Observation status is judged to be reasonable and necessary in order to provide the required intensity of service to ensure the patient's safety. The patient's presenting symptoms, physical exam findings, and initial radiographic and laboratory data in the context of their medical condition is felt to place them at decreased risk for further clinical deterioration. Furthermore, it is anticipated that the patient will be medically stable for discharge from the hospital within 2 midnights of admission. The following factors support the patient status of observation.   " The patient's presenting symptoms include fall, hematoma, tachycardia. " The physical exam findings include left-sided hematoma. " The initial radiographic and laboratory data are CT head with left frontal erythema, no fracture, no acute intracranial abnormality.  CT C-spine with degenerative disc disease but no acute abnormality.  Creatinine 1.26 from baseline 1.  Hemoglobin stable at 12.3.  Troponin 20, 35, 46.   Marcelyn Bruins MD [facility]  How to contact the [facility] Attending or Consulting provider [place] or covering provider during after hours [place], for this patient?   1. Check the care team in [facility] and look for a) attending/consulting TR[facility] provider listed and b) the [facility] team listed 2. Log into [email or web address] and use [place]'s universal password to access. If you do not have the password, please contact the hospital operator. 3. Locate the [facility] provider you are looking for under [facility] and page to a number that you can be directly reached. 4. If you still have difficulty reaching the provider, please page the [facility] (Director on Call) for the Hospitalists listed on amion for assistance.  02/12/2021, 11:37 PM

## 2021-02-12 NOTE — ED Triage Notes (Signed)
Patient arrived by EMS after fall. [facility] had [facility]. Abrasions and hematoma to left forehead and lip. Disoriented to time and place. Alert to self and situation. Per EMS patient takes blood thinners

## 2021-02-13 DIAGNOSIS — Y92009 Unspecified place in unspecified non-institutional (private) residence as the place of occurrence of the external cause: Secondary | ICD-10-CM | POA: Diagnosis not present

## 2021-02-13 DIAGNOSIS — I824Y2 Acute embolism and thrombosis of unspecified deep veins of left proximal lower extremity: Secondary | ICD-10-CM | POA: Diagnosis not present

## 2021-02-13 DIAGNOSIS — W19XXXA Unspecified fall, initial encounter: Secondary | ICD-10-CM | POA: Diagnosis not present

## 2021-02-13 DIAGNOSIS — F0390 Unspecified dementia without behavioral disturbance: Secondary | ICD-10-CM | POA: Diagnosis not present

## 2021-02-13 DIAGNOSIS — S0003XA Contusion of scalp, initial encounter: Secondary | ICD-10-CM | POA: Diagnosis not present

## 2021-02-13 DIAGNOSIS — I248 Other forms of acute ischemic heart disease: Secondary | ICD-10-CM | POA: Diagnosis not present

## 2021-02-13 LAB — CBC
HCT: 37.7 % — ABNORMAL LOW (ref 39.0–52.0)
Hemoglobin: 12.2 g/dL — ABNORMAL LOW (ref 13.0–17.0)
MCH: 29.7 pg (ref 26.0–34.0)
MCHC: 32.4 g/dL (ref 30.0–36.0)
MCV: 91.7 fL (ref 80.0–100.0)
Platelets: 185 10*3/uL (ref 150–400)
RBC: 4.11 MIL/uL — ABNORMAL LOW (ref 4.22–5.81)
RDW: 14.6 % (ref 11.5–15.5)
WBC: 5.2 10*3/uL (ref 4.0–10.5)
nRBC: 0 % (ref 0.0–0.2)

## 2021-02-13 LAB — COMPREHENSIVE METABOLIC PANEL
ALT: 8 U/L (ref 0–44)
AST: 21 U/L (ref 15–41)
Albumin: 2.7 g/dL — ABNORMAL LOW (ref 3.5–5.0)
Alkaline Phosphatase: 74 U/L (ref 38–126)
Anion gap: 7 (ref 5–15)
BUN: 15 mg/dL (ref 8–23)
CO2: 27 mmol/L (ref 22–32)
Calcium: 8.9 mg/dL (ref 8.9–10.3)
Chloride: 101 mmol/L (ref 98–111)
Creatinine, Ser: 1.11 mg/dL (ref 0.61–1.24)
GFR, Estimated: >60 mL/min (ref >60)
Glucose, Bld: 99 mg/dL (ref 70–99)
Potassium: 4.2 mmol/L (ref 3.5–5.1)
Sodium: 135 mmol/L (ref 135–145)
Total Bilirubin: 0.9 mg/dL (ref 0.3–1.2)
Total Protein: 7.4 g/dL (ref 6.5–8.1)

## 2021-02-13 LAB — SARS CORONAVIRUS 2 (TAT 6-24 HRS): SARS Coronavirus 2: NEGATIVE

## 2021-02-13 LAB — TROPONIN I (HIGH SENSITIVITY)
Troponin I (High Sensitivity): 36 ng/L — ABNORMAL HIGH (ref <18)
Troponin I (High Sensitivity): 45 ng/L — ABNORMAL HIGH (ref <18)

## 2021-02-13 MED ORDER — POLYETHYLENE GLYCOL 3350 17 G PO PACK
17.0000 g | PACK | Freq: Every day | ORAL | Status: DC
  Administered 2021-02-13 – 2021-02-16 (×4): 17 g via ORAL
  Filled 2021-02-13 (×3): qty 1

## 2021-02-13 MED ORDER — APIXABAN 5 MG PO TABS
5.0000 mg | ORAL_TABLET | Freq: Two times a day (BID) | ORAL | Status: DC
  Administered 2021-02-13 – 2021-02-16 (×6): 5 mg via ORAL
  Filled 2021-02-13 (×6): qty 1

## 2021-02-13 NOTE — Consult Note (Signed)
[place] for Apixaban Indication: DVT  Labs: Recent Labs    02/12/21 1733 02/12/21 1919 02/12/21 2134 02/13/21 0011 02/13/21 0705  HGB 12.3*  --   --   --  12.2*  HCT 36.9*  --   --   --  37.7*  PLT 225  --   --   --  185  CREATININE 1.26*  --   --   --  1.11  TROPONINIHS 20*   < > 46* 45* 36*   < > = values in this interval not displayed.    Estimated Creatinine Clearance: 54.5 mL/min (by C-G formula based on SCr of 1.11 mg/dL).   Medical History: Past Medical History:  Diagnosis Date  . Dementia ([place])   . Dementia ([place])   . Hyperlipidemia   . Hypertension   . Incontinence     Medications:  Apixaban 5 mg BID prior to admission  Assessment: Patient is a 75 y/o M with medical history including dementia, history of orthostatic hypotension, prostate cancer, recent admission for DVT who is admitted with fall c/b hematoma. Patient did hit his head. No brain bleeding on head imaging. Pharmacy has been consulted to re-initiate anticoagulation.  H&H, platelets stable overall  Plan:  --Apixaban 5 mg BID to resume this evening --CBC at least every 3 days while on apixaban  Benita Gutter 02/13/2021,11:58 AM

## 2021-02-13 NOTE — Evaluation (Signed)
Physical Therapy Evaluation Patient Details Name: Ronnie Bullock MRN: [id number] DOB: Apr 05, 1946 Today's Date: 02/13/2021   History of Present Illness  75 yo male with a fall outdoors was admitted to hosp with contusion to L eye, noted demand ischemia after being lightheaded prior to fall.  Pt is on blood thinner, had recent DVt and has elevation of HR.  PMHx:  dementia, HLD, HTN, incontinence, colon CA, orthostatic,  Clinical Impression  Pt was seen for mobility on side of bed and to walk, but pt is unsteady with mobility, tends to drift posteriorly.  He is appropriate for further PT with a focus on standing and dynamic balance, safety awareness and will need to use a RW for safety currently.  Progress challenge of gait as pt can tolerate to increase speed for return home.      Follow Up Recommendations SNF    Equipment Recommendations  Rolling walker with 5" wheels    Recommendations for Other Services       Precautions / Restrictions Precautions Precautions: Fall Precaution Comments: monitor Sats and HR Restrictions Weight Bearing Restrictions: No      Mobility  Bed Mobility Overal bed mobility: Needs Assistance Bed Mobility: Supine to Sit;Sit to Supine     Supine to sit: Min assist Sit to supine: Min assist   General bed mobility comments: used bedrail and elevated HOB    Transfers Overall transfer level: Needs assistance Equipment used: Rolling walker (2 wheeled) Transfers: Sit to/from Stand Sit to Stand: Min assist Stand pivot transfers: Min assist       General transfer comment: minor cues for hand placement  Ambulation/Gait Ambulation/Gait assistance: Min assist Gait Distance (Feet): 40 Feet Assistive device: Rolling walker (2 wheeled);1 person hand held assist Gait Pattern/deviations: Step-through pattern;Decreased stride length;Wide base of support Gait velocity: Decreased   General Gait Details: pt is walking with support on IV pole  Stairs             Wheelchair Mobility    Modified Rankin (Stroke Patients Only)       Balance Overall balance assessment: Needs assistance Sitting-balance support: Feet supported Sitting balance-Leahy Scale: Good     Standing balance support: Bilateral upper extremity supported;During functional activity Standing balance-Leahy Scale: Fair Standing balance comment: less than fair dynamically                             Pertinent Vitals/Pain Pain Assessment: Faces Faces Pain Scale: Hurts little more Pain Location: Side of face Pain Descriptors / Indicators: Guarding;Sore Pain Intervention(s): Limited activity within patient's tolerance;Monitored during session;Repositioned    Home Living Family/patient expects to be discharged to:: Private residence Living Arrangements: Other relatives Available Help at Discharge: Family Type of Home: House Home Access: Stairs to enter Entrance Stairs-Rails: Right Entrance Stairs-Number of Steps: 3 Home Layout: Two level Home Equipment: None Additional Comments: has walked with no AD even outdoors    Prior Function Level of Independence: Independent               Hand Dominance        Extremity/Trunk Assessment   Upper Extremity Assessment Upper Extremity Assessment: Overall WFL for tasks assessed    Lower Extremity Assessment Lower Extremity Assessment: Overall WFL for tasks assessed    Cervical / Trunk Assessment Cervical / Trunk Assessment: Kyphotic (mild)  Communication   Communication: Expressive difficulties (mildly difficult to understand speech)  Cognition Arousal/Alertness: Awake/alert Behavior During Therapy: [facility]  for tasks assessed/performed Overall Cognitive Status: No family/caregiver present to determine baseline cognitive functioning                                 General Comments: has some limited history      General Comments General comments (skin integrity, edema, etc.): Pt  was seen for mobility on IV pole and is somewhat unsteady, not exactly from LE strength but generally low endurance and low control of balance    Exercises     Assessment/Plan    PT Assessment Patient needs continued PT services  PT Problem List Decreased strength;Decreased mobility;Decreased range of motion;Decreased coordination;Decreased activity tolerance;Decreased balance;Decreased knowledge of use of DME;Decreased safety awareness;Cardiopulmonary status limiting activity;Decreased skin integrity;Pain       PT Treatment Interventions DME instruction;Gait training;Stair training;Functional mobility training;Therapeutic activities;Therapeutic exercise;Balance training;Neuromuscular re-education;Patient/family education    PT Goals (Current goals can be found in the Care Plan section)  Acute Rehab PT Goals Patient Stated Goal: feel better, get home PT Goal Formulation: With patient Time For Goal Achievement: 02/27/21 Potential to Achieve Goals: Good    Frequency Min 2X/week   Barriers to discharge Inaccessible home environment stairs to enter and go up to BR at home    Co-evaluation               AM-PAC PT "6 Clicks" Mobility  Outcome Measure Help needed turning from your back to your side while in a flat bed without using bedrails?: None Help needed moving from lying on your back to sitting on the side of a flat bed without using bedrails?: A Little Help needed moving to and from a bed to a chair (including a wheelchair)?: A Little Help needed standing up from a chair using your arms (e.g., wheelchair or bedside chair)?: A Little Help needed to walk in hospital room?: A Little Help needed climbing 3-5 steps with a railing? : A Lot 6 Click Score: 18    End of Session Equipment Utilized During Treatment: Gait belt Activity Tolerance: Patient limited by pain;Other (comment) Patient left: in bed;with call bell/phone within reach;with bed alarm set Nurse Communication:  Mobility status PT Visit Diagnosis: Unsteadiness on feet (R26.81);Muscle weakness (generalized) (M62.81);Difficulty in walking, not elsewhere classified (R26.2);Pain Pain - Right/Left: Left Pain - part of body:  (face)    Time: 8101-7510 PT Time Calculation (min) (ACUTE ONLY): 25 min   Charges:   PT Evaluation $PT Eval Moderate Complexity: 1 Mod PT Treatments $Gait Training: 8-22 mins       Ramond Dial 02/13/2021, 8:44 PM Mee Hives, PT MS Acute Rehab Dept. Number: [place] and [place]

## 2021-02-13 NOTE — Progress Notes (Signed)
PROGRESS NOTE    Ronnie Bullock  TKW:[id number] DOB: 03-May-1946 DOA: 02/12/2021 PCP: [name], DO    Brief Narrative:  dementia, orthostatic hypotension, hypertension, hyperlipidemia, prostate cancer, recent admission for DVT who presents following a fall.Some history obtained with assistance of family or chart review due to patient's history of dementia.  Patient was walking quickly almost jogging to the drugstore with his brother when he had a fall.  Patient states he does not think he tripped on anything nor does his brother.  He denies lightheadedness prior to fall.  His brother states that the surface that he was on was a little uneven and may have contributed to the fall. He did hit his head and arrived with a hematoma on the left side.  He is currently on blood thinners following a recent admission in March for DVT and thrombectomy.  No PE during that admission.CT C-spine showed no acute outer maladies but did demonstrate degenerative disc disease.  CT head-1. Left frontal/supraorbital scalp hematoma. No acute intracranial abnormality. No skull fracture. 2. Stable atrophy and chronic ischemia. 3. Chronic sinusitis.  4/9- eliquis on hold. Lt eye swollen, but pt states doing fine. Unable to open eye.   Consultants:     Procedures:   Antimicrobials:      Subjective: Denies pain, sob, dizziness  Objective: Vitals:   02/12/21 1830 02/13/21 0138 02/13/21 0329 02/13/21 0732  BP: 130/84 127/89 (!) 142/85 (!) 137/108  Pulse: 92 83 (!) 58 70  Resp: 16 17 18 16   Temp: 97.7 F (36.5 C)  97.6 F (36.4 C) 97.6 F (36.4 C)  TempSrc: Oral  Oral   SpO2: 96% 96% 96% (!) 87%  Weight:      Height:         Filed Weights   02/12/21 1648  Weight: 68 kg    Examination:  General exam: Appears calm and comfortable  HEENT: Lt face with facial scrape. Lt eye fully shut. No discharge. Swollen lt orbital and temporal area. Respiratory system: Clear to auscultation.  Respiratory effort normal. Cardiovascular system: S1 & S2 heard, RRR. No JVD, murmurs, rubs, gallops or clicks.  Gastrointestinal system: Abdomen is nondistended, soft and nontender.Normal bowel sounds heard. Central nervous system: Alert and oriented.  Extremities: no edema  Psychiatry: . Mood & affect appropriate.     Data Reviewed: I have personally reviewed following labs and imaging studies  CBC: Recent Labs  Lab 02/09/21 1055 02/12/21 1733 02/13/21 0705  WBC 5.5 5.6 5.2  NEUTROABS 3.3  --   --   HGB 12.1* 12.3* 12.2*  HCT 35.6* 36.9* 37.7*  MCV 87 90.4 91.7  PLT 288 225 924   Basic Metabolic Panel: Recent Labs  Lab 02/09/21 1055 02/12/21 1733 02/13/21 0705  NA 143 135 135  K 3.9 3.9 4.2  CL 104 100 101  CO2 22 25 27   GLUCOSE 104* 113* 99  BUN 15 22 15   CREATININE 1.22 1.26* 1.11  CALCIUM 8.9 8.9 8.9   GFR: Estimated Creatinine Clearance: 54.5 mL/min (by C-G formula based on SCr of 1.11 mg/dL). Liver Function Tests: Recent Labs  Lab 02/13/21 0705  AST 21  ALT 8  ALKPHOS 74  BILITOT 0.9  PROT 7.4  ALBUMIN 2.7*   No results for input(s): LIPASE, AMYLASE in the last 168 hours. No results for input(s): AMMONIA in the last 168 hours. Coagulation Profile: No results for input(s): INR, PROTIME in the last 168 hours. Cardiac Enzymes: No results for input(s):  CKTOTAL, CKMB, CKMBINDEX, TROPONINI in the last 168 hours. BNP (last 3 results) No results for input(s): PROBNP in the last 8760 hours. HbA1C: No results for input(s): HGBA1C in the last 72 hours. CBG: No results for input(s): GLUCAP in the last 168 hours. Lipid Profile: No results for input(s): CHOL, HDL, LDLCALC, TRIG, CHOLHDL, LDLDIRECT in the last 72 hours. Thyroid Function Tests: No results for input(s): TSH, T4TOTAL, FREET4, T3FREE, THYROIDAB in the last 72 hours. Anemia Panel: No results for input(s): VITAMINB12, FOLATE, FERRITIN, TIBC, IRON, RETICCTPCT in the last 72 hours. Sepsis  Labs: No results for input(s): PROCALCITON, LATICACIDVEN in the last 168 hours.  No results found for this or any previous visit (from the past 240 hour(s)).       Radiology Studies: CT Head Wo Contrast  Result Date: 02/12/2021 CLINICAL DATA:  [facility]  Abrasions to head. EXAM: CT HEAD WITHOUT CONTRAST TECHNIQUE: Contiguous axial images were obtained from the base of the skull through the vertex without intravenous contrast. COMPARISON:  Head CT 12/02/2020 FINDINGS: Brain: Stable degree of atrophy and chronic small vessel ischemia. No intracranial hemorrhage, mass effect, or midline shift. No hydrocephalus. The basilar cisterns are patent. Chronic low-density involving left basal ganglia and inferior frontal lobe likely sequela of chronic ischemia, stable from prior. No evidence of territorial infarct or acute ischemia. No extra-axial or intracranial fluid collection. Vascular: Atherosclerosis of skullbase vasculature without hyperdense vessel or abnormal calcification. Skull: No fracture or focal lesion. Sinuses/Orbits: Left supraorbital scalp hematoma. Chronic opacification of the included paranasal sinuses, stable in appearance from prior. No mastoid effusion. Other: Left frontal/supraorbital scalp hematoma. IMPRESSION: 1. Left frontal/supraorbital scalp hematoma. No acute intracranial abnormality. No skull fracture. 2. Stable atrophy and chronic ischemia. 3. Chronic sinusitis. Electronically Signed   By: Keith Rake M.D.   On: 02/12/2021 17:32   CT Cervical Spine Wo Contrast  Result Date: 02/12/2021 CLINICAL DATA:  [facility] EXAM: CT CERVICAL SPINE WITHOUT CONTRAST TECHNIQUE: Multidetector CT imaging of the cervical spine was performed without intravenous contrast. Multiplanar CT image reconstructions were also generated. COMPARISON:  None. FINDINGS: Alignment: Broad-based reversal of normal lordosis. No traumatic subluxation. Skull base and vertebrae: No acute  fracture. Vertebral body heights are maintained. The dens and skull base are intact. Soft tissues and spinal canal: No prevertebral fluid or swelling. No visible canal hematoma. Disc levels: Diffuse degenerative disc disease with disc space narrowing and endplate spurring. No high-grade canal stenosis. Upper chest: Patulous upper esophagus containing intraluminal fluid is well as retained secretions in the pharynx. No apical pneumothorax or rib fracture. Other: None. IMPRESSION: 1. No acute fracture or subluxation of the cervical spine. 2. Broad-based reversal of normal lordosis may be due to positioning or muscle spasm. 3. Diffuse degenerative disc disease. 4. Patulous upper esophagus containing intraluminal fluid is well as retained secretions in the pharynx. Electronically Signed   By: Keith Rake M.D.   On: 02/12/2021 17:36        Scheduled Meds: . amLODipine  5 mg Oral Daily  . polyethylene glycol  17 g Oral Daily  . sodium chloride flush  3 mL Intravenous Q12H   Continuous Infusions:  Assessment & Plan:   Active Problems:   DVT (deep venous thrombosis) (HCC)   Dementia without behavioral disturbance ([place])   Demand ischemia ([place])   Fall at home, initial encounter   Scalp hematoma, initial encounter   Demand ischemia Elevated Troponin-2/2 demand ischemia from fall/dehydration/tachycardia  No cp or  sob. Trending down. Will check echo   Mechanical Fall Hematoma Hx of Orthostasis Spoke to brother this morning, pt was walking fast in uneven pavement. appears to be mechanical. No syncope or presyncope reported by pt or brother. Eliquis on hold . Spoke to Ophthalmology on call by phone, Dr. Linton Flemings about eye exam, after discussing CT scan and findings, per opthalmo, can resume Eliquis anytime and that pt's eyes will take a while before hematoma resolves.  CT head without brain bleed. Resume Eliquis first dose tonight and monitor. Cbc stable. Check orthostatics, d/w nsg  via chat. PT consulted   AKI- 2/2 prerenal/Dehydration Improved with ivf.  Continue to monitor.   Dementia -without behavioral disturbance   History of DVT > Recent admission for this in March is status post thrombectomy and on Eliquis. Eliquis held last night Will resume first dose tonight.      DVT prophylaxis: scd  Code Status:full Family Communication: updated brother  Status is: Observation  The patient remains OBS appropriate and will d/c before 2 midnights.  Dispo: The patient is from: Home              Anticipated d/c is to: Home              Patient currently is not medically stable to d/c.   Difficult to place patient No            LOS: 0 days   Time spent: 35 min with >50% on coc    Nolberto Hanlon, MD [facility] Pager [phone]  If 7PM-7AM, please contact night-coverage 02/13/2021, 11:40 AM

## 2021-02-13 NOTE — Progress Notes (Signed)
Admission profile limited due to baseline dementia, current head trauma and varying/condicting answers.

## 2021-02-13 NOTE — Progress Notes (Signed)
Spoke with pt's brother, [facility], whom stated he is not a legal appointed guardian but does help his brother make decisions.

## 2021-02-13 NOTE — ED Notes (Signed)
Pt requested something to eat. Pt given [place] sandwich tray and lemon lime shasta.

## 2021-02-13 NOTE — Progress Notes (Signed)
Pt reports having legal guardian, [facility]. Pt reports this is his brother.

## 2021-02-14 ENCOUNTER — Observation Stay (HOSPITAL_BASED_OUTPATIENT_CLINIC_OR_DEPARTMENT_OTHER)
Admit: 2021-02-14 | Discharge: 2021-02-14 | Disposition: A | Discharge status: Home / Self Care | Payer: Medicare HMO | Attending: Internal Medicine | Admitting: Internal Medicine

## 2021-02-14 DIAGNOSIS — R7989 Other specified abnormal findings of blood chemistry: Secondary | ICD-10-CM

## 2021-02-14 DIAGNOSIS — F0390 Unspecified dementia without behavioral disturbance: Secondary | ICD-10-CM | POA: Diagnosis not present

## 2021-02-14 DIAGNOSIS — W19XXXA Unspecified fall, initial encounter: Secondary | ICD-10-CM | POA: Diagnosis not present

## 2021-02-14 DIAGNOSIS — S0003XA Contusion of scalp, initial encounter: Secondary | ICD-10-CM | POA: Diagnosis not present

## 2021-02-14 DIAGNOSIS — I248 Other forms of acute ischemic heart disease: Secondary | ICD-10-CM

## 2021-02-14 DIAGNOSIS — I824Y2 Acute embolism and thrombosis of unspecified deep veins of left proximal lower extremity: Secondary | ICD-10-CM | POA: Diagnosis not present

## 2021-02-14 DIAGNOSIS — Y92009 Unspecified place in unspecified non-institutional (private) residence as the place of occurrence of the external cause: Secondary | ICD-10-CM | POA: Diagnosis not present

## 2021-02-14 LAB — CBC
HCT: 37.6 % — ABNORMAL LOW (ref 39.0–52.0)
Hemoglobin: 12.2 g/dL — ABNORMAL LOW (ref 13.0–17.0)
MCH: 29.3 pg (ref 26.0–34.0)
MCHC: 32.4 g/dL (ref 30.0–36.0)
MCV: 90.4 fL (ref 80.0–100.0)
Platelets: 205 10*3/uL (ref 150–400)
RBC: 4.16 MIL/uL — ABNORMAL LOW (ref 4.22–5.81)
RDW: 14.6 % (ref 11.5–15.5)
WBC: 4.6 10*3/uL (ref 4.0–10.5)
nRBC: 0 % (ref 0.0–0.2)

## 2021-02-14 LAB — ECHOCARDIOGRAM COMPLETE
AR max vel: 2.07 cm2
AV Peak grad: 5.6 mmHg
Ao pk vel: 1.18 m/s
Area-P 1/2: 3 cm2
Height: 71 in
S' Lateral: 1.88 cm
Weight: 2400 oz

## 2021-02-14 NOTE — Progress Notes (Signed)
PROGRESS NOTE    Ronnie Bullock  KVQ:[id number] DOB: 04-29-1946 DOA: 02/12/2021 PCP: [name], DO    Brief Narrative:  dementia, orthostatic hypotension, hypertension, hyperlipidemia, prostate cancer, recent admission for DVT who presents following a fall.Some history obtained with assistance of family or chart review due to patient's history of dementia.  Patient was walking quickly almost jogging to the drugstore with his brother when he had a fall.  Patient states he does not think he tripped on anything nor does his brother.  He denies lightheadedness prior to fall.  His brother states that the surface that he was on was a little uneven and may have contributed to the fall. He did hit his head and arrived with a hematoma on the left side.  He is currently on blood thinners following a recent admission in March for DVT and thrombectomy.  No PE during that admission.CT C-spine showed no acute outer maladies but did demonstrate degenerative disc disease.  CT head-1. Left frontal/supraorbital scalp hematoma. No acute intracranial abnormality. No skull fracture. 2. Stable atrophy and chronic ischemia. 3. Chronic sinusitis.  4/10-resumed Eliquis last night.  Able to open his lt eyes half way today. Less swelling. Denies vision blurriness or pain.   Consultants:     Procedures:   Antimicrobials:      Subjective: Denies eye pain, blurry vision, double vision, or chest pain  Objective: Vitals:   02/13/21 2109 02/14/21 0010 02/14/21 0533 02/14/21 0849  BP: 113/86 131/86 (!) 127/99 132/88  Pulse: 83 (!) 57 81 (!) 55  Resp: 16 16 16 16   Temp: 98.4 F (36.9 C) 98.2 F (36.8 C) 98 F (36.7 C) 98.6 F (37 C)  TempSrc: Oral Oral Oral Oral  SpO2: 95% 94% 97% 97%  Weight:      Height:         Filed Weights   02/12/21 1648  Weight: 68 kg    Examination: Interactive, NAD HEENT: EOMI, Lt temporal and orbital swelling down , able to open eye 1/2 way today CTA no wheeze  rales rhonchi's Regular S1-S2 no gallop Soft benign positive bowel sounds No edema Awake and alert grossly intact 5 out of 5 strength x4 Mood and affect appropriate in current setting    Data Reviewed: I have personally reviewed following labs and imaging studies  CBC: Recent Labs  Lab 02/09/21 1055 02/12/21 1733 02/13/21 0705 02/14/21 0604  WBC 5.5 5.6 5.2 4.6  NEUTROABS 3.3  --   --   --   HGB 12.1* 12.3* 12.2* 12.2*  HCT 35.6* 36.9* 37.7* 37.6*  MCV 87 90.4 91.7 90.4  PLT 288 225 185 643   Basic Metabolic Panel: Recent Labs  Lab 02/09/21 1055 02/12/21 1733 02/13/21 0705  NA 143 135 135  K 3.9 3.9 4.2  CL 104 100 101  CO2 22 25 27   GLUCOSE 104* 113* 99  BUN 15 22 15   CREATININE 1.22 1.26* 1.11  CALCIUM 8.9 8.9 8.9   GFR: Estimated Creatinine Clearance: 54.5 mL/min (by C-G formula based on SCr of 1.11 mg/dL). Liver Function Tests: Recent Labs  Lab 02/13/21 0705  AST 21  ALT 8  ALKPHOS 74  BILITOT 0.9  PROT 7.4  ALBUMIN 2.7*   No results for input(s): LIPASE, AMYLASE in the last 168 hours. No results for input(s): AMMONIA in the last 168 hours. Coagulation Profile: No results for input(s): INR, PROTIME in the last 168 hours. Cardiac Enzymes: No results for input(s): CKTOTAL, CKMB, CKMBINDEX,  TROPONINI in the last 168 hours. BNP (last 3 results) No results for input(s): PROBNP in the last 8760 hours. HbA1C: No results for input(s): HGBA1C in the last 72 hours. CBG: No results for input(s): GLUCAP in the last 168 hours. Lipid Profile: No results for input(s): CHOL, HDL, LDLCALC, TRIG, CHOLHDL, LDLDIRECT in the last 72 hours. Thyroid Function Tests: No results for input(s): TSH, T4TOTAL, FREET4, T3FREE, THYROIDAB in the last 72 hours. Anemia Panel: No results for input(s): VITAMINB12, FOLATE, FERRITIN, TIBC, IRON, RETICCTPCT in the last 72 hours. Sepsis Labs: No results for input(s): PROCALCITON, LATICACIDVEN in the last 168 hours.  Recent  Results (from the past 240 hour(s))  SARS CORONAVIRUS 2 (TAT 6-24 HRS) Nasopharyngeal Nasopharyngeal Swab     Status: None   Collection Time: 02/13/21 12:11 AM   Specimen: Nasopharyngeal Swab  Result Value Ref Range Status   SARS Coronavirus 2 NEGATIVE NEGATIVE Final    Comment: (NOTE) SARS-CoV-2 target nucleic acids are NOT DETECTED.  The SARS-CoV-2 RNA is generally detectable in upper and lower respiratory specimens during the acute phase of infection. Negative results do not preclude SARS-CoV-2 infection, do not rule out co-infections with other pathogens, and should not be used as the sole basis for treatment or other patient management decisions. Negative results must be combined with clinical observations, patient history, and epidemiological information. The expected result is Negative.  Fact Sheet for Patients: SugarRoll.be  Fact Sheet for Healthcare Providers: https://www.woods-mathews.com/  This test is not yet approved or cleared by the [place] FDA and  has been authorized for detection and/or diagnosis of SARS-CoV-2 by FDA under an Emergency Use Authorization (EUA). This EUA will remain  in effect (meaning this test can be used) for the duration of the COVID-19 declaration under Se ction 564(b)(1) of the Act, 21 U.S.C. section 360bbb-3(b)(1), unless the authorization is terminated or revoked sooner.  Performed at [facility], [place] [address], [place], [place] [address]          Radiology Studies: CT Head Wo Contrast  Result Date: 02/12/2021 CLINICAL DATA:  [facility]  Abrasions to head. EXAM: CT HEAD WITHOUT CONTRAST TECHNIQUE: Contiguous axial images were obtained from the base of the skull through the vertex without intravenous contrast. COMPARISON:  Head CT 12/02/2020 FINDINGS: Brain: Stable degree of atrophy and chronic small vessel ischemia. No intracranial hemorrhage, mass effect, or midline  shift. No hydrocephalus. The basilar cisterns are patent. Chronic low-density involving left basal ganglia and inferior frontal lobe likely sequela of chronic ischemia, stable from prior. No evidence of territorial infarct or acute ischemia. No extra-axial or intracranial fluid collection. Vascular: Atherosclerosis of skullbase vasculature without hyperdense vessel or abnormal calcification. Skull: No fracture or focal lesion. Sinuses/Orbits: Left supraorbital scalp hematoma. Chronic opacification of the included paranasal sinuses, stable in appearance from prior. No mastoid effusion. Other: Left frontal/supraorbital scalp hematoma. IMPRESSION: 1. Left frontal/supraorbital scalp hematoma. No acute intracranial abnormality. No skull fracture. 2. Stable atrophy and chronic ischemia. 3. Chronic sinusitis. Electronically Signed   By: Keith Rake M.D.   On: 02/12/2021 17:32   CT Cervical Spine Wo Contrast  Result Date: 02/12/2021 CLINICAL DATA:  [facility] EXAM: CT CERVICAL SPINE WITHOUT CONTRAST TECHNIQUE: Multidetector CT imaging of the cervical spine was performed without intravenous contrast. Multiplanar CT image reconstructions were also generated. COMPARISON:  None. FINDINGS: Alignment: Broad-based reversal of normal lordosis. No traumatic subluxation. Skull base and vertebrae: No acute fracture. Vertebral body heights are maintained.  The dens and skull base are intact. Soft tissues and spinal canal: No prevertebral fluid or swelling. No visible canal hematoma. Disc levels: Diffuse degenerative disc disease with disc space narrowing and endplate spurring. No high-grade canal stenosis. Upper chest: Patulous upper esophagus containing intraluminal fluid is well as retained secretions in the pharynx. No apical pneumothorax or rib fracture. Other: None. IMPRESSION: 1. No acute fracture or subluxation of the cervical spine. 2. Broad-based reversal of normal lordosis may be due to positioning or  muscle spasm. 3. Diffuse degenerative disc disease. 4. Patulous upper esophagus containing intraluminal fluid is well as retained secretions in the pharynx. Electronically Signed   By: Keith Rake M.D.   On: 02/12/2021 17:36        Scheduled Meds: . amLODipine  5 mg Oral Daily  . apixaban  5 mg Oral BID  . polyethylene glycol  17 g Oral Daily  . sodium chloride flush  3 mL Intravenous Q12H   Continuous Infusions:  Assessment & Plan:   Active Problems:   DVT (deep venous thrombosis) (HCC)   Dementia without behavioral disturbance ([place])   Demand ischemia ([place])   Fall at home, initial encounter   Scalp hematoma, initial encounter   Elevated troponin Likely due to demand ischemia from fall/dehydration/tachycardia  No chest pain or shortness of breath  Troponin trending down  Echo pending we will follow up   Mechanical Fall Hematoma Hx of Orthostasis Spoke to brother this morning, pt was walking fast in uneven pavement. appears to be mechanical. No syncope or presyncope reported by pt or brother.Marland Kitchen Spoke to Ophthalmology on call by phone, Dr. Linton Flemings about eye exam, after discussing CT scan and findings, per opthalmo, can resume Eliquis anytime and that pt's eyes will take a while before hematoma resolves.  CT head without brain bleed. 4/10-Eliquis resumed first dose last night Hematoma of the orbits appears to be improving, able to open his eyes halfway now compared to yesterday which he was short CBC stable Orthostatics negative PT recommends SNF    AKI- 2/2 prerenal/Dehydration Improved with IV fluids  Continue to monitor    Dementia Without behavioral disturbance    History of DVT > Recent admission for this in March is status post thrombectomy and on Eliquis. Eliquis resumed as above, first dose last night      DVT prophylaxis: scd, Eliquis Code Status:full Family Communication: updated brother  Status is: Observation  The patient remains  OBS appropriate and will d/c before 2 midnights.  Dispo: The patient is from: Home              Anticipated d/c is to: SNF              Patient currently is not medically stable to d/c.   Difficult to place patient No            LOS: 0 days   Time spent: 35 min with >50% on coc    Ronnie Hanlon, MD [facility] Pager [phone]  If 7PM-7AM, please contact night-coverage 02/14/2021, 11:52 AM

## 2021-02-14 NOTE — Plan of Care (Signed)

## 2021-02-14 NOTE — Plan of Care (Signed)

## 2021-02-14 NOTE — Progress Notes (Signed)
*  PRELIMINARY RESULTS* Echocardiogram 2D Echocardiogram has been performed.  Ronnie Bullock 02/14/2021, 9:51 AM

## 2021-02-15 DIAGNOSIS — Y92009 Unspecified place in unspecified non-institutional (private) residence as the place of occurrence of the external cause: Secondary | ICD-10-CM | POA: Diagnosis not present

## 2021-02-15 DIAGNOSIS — F0390 Unspecified dementia without behavioral disturbance: Secondary | ICD-10-CM | POA: Diagnosis not present

## 2021-02-15 DIAGNOSIS — N179 Acute kidney failure, unspecified: Secondary | ICD-10-CM

## 2021-02-15 DIAGNOSIS — W19XXXA Unspecified fall, initial encounter: Secondary | ICD-10-CM | POA: Diagnosis not present

## 2021-02-15 LAB — BASIC METABOLIC PANEL
Anion gap: 6 (ref 5–15)
BUN: 21 mg/dL (ref 8–23)
CO2: 27 mmol/L (ref 22–32)
Calcium: 8.6 mg/dL — ABNORMAL LOW (ref 8.9–10.3)
Chloride: 102 mmol/L (ref 98–111)
Creatinine, Ser: 1.19 mg/dL (ref 0.61–1.24)
GFR, Estimated: >60 mL/min (ref >60)
Glucose, Bld: 96 mg/dL (ref 70–99)
Potassium: 4.1 mmol/L (ref 3.5–5.1)
Sodium: 135 mmol/L (ref 135–145)

## 2021-02-15 LAB — CBC
HCT: 34.1 % — ABNORMAL LOW (ref 39.0–52.0)
Hemoglobin: 11.1 g/dL — ABNORMAL LOW (ref 13.0–17.0)
MCH: 29.4 pg (ref 26.0–34.0)
MCHC: 32.6 g/dL (ref 30.0–36.0)
MCV: 90.2 fL (ref 80.0–100.0)
Platelets: 198 10*3/uL (ref 150–400)
RBC: 3.78 MIL/uL — ABNORMAL LOW (ref 4.22–5.81)
RDW: 14.6 % (ref 11.5–15.5)
WBC: 4.6 10*3/uL (ref 4.0–10.5)
nRBC: 0 % (ref 0.0–0.2)

## 2021-02-15 NOTE — NC FL2 (Signed)
[place] LEVEL OF CARE SCREENING TOOL     IDENTIFICATION  Patient Name: Ronnie Bullock Birthdate: Sep 29, 1946 Sex: male Admission Date (Current Location): 02/12/2021  [place] and [place] Number:  Engineering geologist and Address:  [facility], [address] [place], [place] [address]      Provider Number: [id number]  Attending Physician Name and Address:  Wyvonnia Dusky, MD  Relative Name and Phone Number:  Debara Pickett-  [phone]    Current Level of Care: Hospital Recommended Level of Care: [place] Prior Approval Number:    Date Approved/Denied:   PASRR Number: [id number]  Discharge Plan: SNF    Current Diagnoses: Patient Active Problem List   Diagnosis Date Noted  . Demand ischemia ([place]) 02/12/2021  . Fall at home, initial encounter 02/12/2021  . Scalp hematoma, initial encounter 02/12/2021  . Dementia without behavioral disturbance ([place]) 01/21/2021  . Sinus tachycardia 01/21/2021  . DVT (deep venous thrombosis) ([place]) 01/20/2021  . Scrotal swelling 11/03/2020  . Dizziness 11/03/2020  . Dark stools 09/23/2020  . Prostate cancer ([place]) 07/03/2019  . Elevated PSA 03/28/2019  . 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 Benign neoplasm of ascending colon   . Benign neoplasm of cecum   . Hypercholesteremia 05/02/2018  . Advanced care planning/counseling discussion 11/10/2017  . Essential hypertension, benign 10/10/2017    Orientation RESPIRATION BLADDER Height & Weight     Self,Situation,Place  Normal Continent Weight: 68 kg Height:  5\' 11"  (180.3 cm)  BEHAVIORAL SYMPTOMS/MOOD NEUROLOGICAL BOWEL NUTRITION STATUS      Continent Diet (regular)  AMBULATORY STATUS COMMUNICATION OF NEEDS Skin   Extensive Assist Verbally  (left scalp hematoma)                       Personal Care Assistance Level of Assistance  Dressing     Dressing Assistance: Limited assistance     Functional Limitations  Info  Sight Sight Info: Impaired        SPECIAL CARE FACTORS FREQUENCY  PT (By licensed PT)     PT Frequency: 5 times per week              Contractures Contractures Info: Not present    Additional Factors Info  Code Status,Allergies Code Status Info: full code Allergies Info: NKDA           Current Medications (02/15/2021):  This is the current hospital active medication list Current Facility-Administered Medications  Medication Dose Route Frequency Provider Last Rate Last Admin  . acetaminophen (TYLENOL) tablet 650 mg  650 mg Oral Q6H PRN Marcelyn Bruins, MD       Or  . acetaminophen (TYLENOL) suppository 650 mg  650 mg Rectal Q6H PRN Marcelyn Bruins, MD      . amLODipine (NORVASC) tablet 5 mg  5 mg Oral Daily Marcelyn Bruins, MD   5 mg at 02/15/21 0829  . apixaban (ELIQUIS) tablet 5 mg  5 mg Oral BID Benita Gutter, RPH   5 mg at 02/15/21 8850  . polyethylene glycol (MIRALAX / GLYCOLAX) packet 17 g  17 g Oral Daily Nolberto Hanlon, MD   17 g at 02/15/21 0829  . sodium chloride flush (NS) 0.9 % injection 3 mL  3 mL Intravenous Q12H Marcelyn Bruins, MD   3 mL at 02/15/21 2774     Discharge Medications: Please see discharge summary for a list of discharge medications.  Relevant Imaging Results:  Relevant Lab Results:   Additional Information SS# [id number]  Su Hilt, RN

## 2021-02-15 NOTE — TOC Progression Note (Signed)
Transition of Care [facility]) - Progression Note    Patient Details  Name: Ronnie Bullock MRN: [id number] Date of Birth: [birth year]  [facility] of [facility]) CM/SW Contact  Su Hilt, RN Phone Number: 02/15/2021, 1:46 PM  Clinical Narrative:   Damaris Schooner to [facility] and he would like to accept the bed offer from [facility], Administrator, arts at [place], Started ins auth process   Expected Discharge Plan: [place] Barriers to Discharge: Continued Medical Work up  Expected Discharge Plan and Services Expected Discharge Plan: [place]   Discharge Planning Services: CM Consult   Living arrangements for the past 2 months: Single Family Home                                       Social Determinants of Health (SDOH) Interventions    Readmission Risk Interventions No flowsheet data found.

## 2021-02-15 NOTE — TOC Progression Note (Signed)
Transition of Care [facility]) - Progression Note    Patient Details  Name: Ronnie Bullock MRN: [id number] Date of Birth: November 23, 1945  Transition of Care [facility]) CM/SW [place], RN Phone Number: 02/15/2021, 1:25 PM  Clinical Narrative:   Spoke to [facility] the patients brother, reviewed bed offers, He is going to look over the offers and call me back with a choice    Expected Discharge Plan: Skilled Nursing Facility Barriers to Discharge: Continued Medical Work up  Expected Discharge Plan and Services Expected Discharge Plan: [employer]   Discharge Planning Services: CM Consult   Living arrangements for the past 2 months: Single Family Home                                       Social Determinants of Health (SDOH) Interventions    Readmission Risk Interventions No flowsheet data found.

## 2021-02-15 NOTE — Progress Notes (Signed)
Spoke with the patient's brother Linward Foster on the phone, the patient has dementia He lives at home with his brother He brother agrees that the patient should go to rehab for short term to get stronger, Bed search completed awaiting bed offers

## 2021-02-15 NOTE — TOC Progression Note (Signed)
Transition of Care [facility]) - Progression Note    Patient Details  Name: Ronnie Bullock MRN: [id number] Date of Birth: [birth year]  Transition of Care [facility]) CM/SW Contact  Su Hilt, RN Phone Number: 02/15/2021, 1:56 PM  Clinical Narrative:   Ronnie Bullock health and they do not manage the patient he is managed by Ronnie Bullock, I notified Ronnie Bullock at [facility] and they will start auth    Expected Bullock Plan: Ronnie Bullock: Continued Medical Work up  Expected Bullock Plan and Services Expected Bullock Plan: Ronnie Bullock   Bullock Planning Services: CM Consult   Living arrangements for the past 2 months: Single Family Home                                       Social Determinants of Health (SDOH) Interventions    Readmission Risk Interventions No flowsheet data found.

## 2021-02-15 NOTE — Plan of Care (Signed)
  Problem: Education: Goal: Knowledge of General Education information will improve Description: Including pain rating scale, medication(s)/side effects and non-pharmacologic comfort measures Outcome: Progressing   Problem: Health Behavior/Discharge Planning: Goal: Ability to manage health-related needs will improve Outcome: Progressing   Problem: Clinical Measurements: Goal: Ability to maintain clinical measurements within normal limits will improve Outcome: Progressing Goal: Will remain free from infection Outcome: Progressing Goal: Diagnostic test results will improve Outcome: Progressing Goal: Respiratory complications will improve Outcome: Progressing Goal: Cardiovascular complication will be avoided Outcome: Progressing   Problem: Activity: Goal: Risk for activity intolerance will decrease Outcome: Progressing   Problem: Nutrition: Goal: Adequate nutrition will be maintained Outcome: Progressing   Problem: Coping: Goal: Level of anxiety will decrease Outcome: Progressing   Problem: Elimination: Goal: Will not experience complications related to bowel motility Outcome: Progressing Goal: Will not experience complications related to urinary retention Outcome: Progressing   Problem: Clinical Measurements: Goal: Ability to maintain clinical measurements within normal limits will improve Outcome: Progressing Goal: Will remain free from infection Outcome: Progressing Goal: Diagnostic test results will improve Outcome: Progressing Goal: Respiratory complications will improve Outcome: Progressing Goal: Cardiovascular complication will be avoided Outcome: Progressing   Problem: Coping: Goal: Level of anxiety will decrease Outcome: Progressing   Problem: Nutrition: Goal: Adequate nutrition will be maintained Outcome: Progressing   Problem: Activity: Goal: Risk for activity intolerance will decrease Outcome: Progressing

## 2021-02-15 NOTE — Progress Notes (Signed)
PROGRESS NOTE    Ronnie Bullock  VEH:[id number] DOB: 02-15-1946 DOA: 02/12/2021 PCP: [name], DO   Assessment & Plan:   Active Problems:   DVT (deep venous thrombosis) (HCC)   Dementia without behavioral disturbance ([place])   Demand ischemia ([place])   Fall at home, initial encounter   Scalp hematoma, initial encounter  Fall: mechanical. PT recs SNF  Left frontal/supraorbital scalp hematoma: likely secondary mechanical fall. Continue w/ supportive care   AKI: secondary to dehydration. 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  Dispo: The patient is from: Home              Anticipated d/c is to: SNF              Patient currently is not medically stable to d/c.   Difficult to place patient: I dont know     Consultants:      Procedures:   Antimicrobials:   Subjective: Pt c/o fatigue   Objective: Vitals:   02/14/21 1224 02/14/21 1526 02/14/21 2005 02/15/21 0358  BP: 112/81 107/65 113/77 127/87  Pulse: 76 83 88 90  Resp: 17 14 18 18   Temp: 98.7 F (37.1 C) 98.7 F (37.1 C) 98.9 F (37.2 C) 98.3 F (36.8 C)  TempSrc: Oral Oral Oral Oral  SpO2: 95% 95% 98% 99%  Weight:      Height:        Intake/Output Summary (Last 24 hours) at 02/15/2021 0716 Last data filed at 02/14/2021 1700 Gross per 24 hour  Intake 360 ml  Output 600 ml  Net -240 ml   Filed Weights   02/12/21 1648  Weight: 68 kg    Examination:  General exam: Appears calm and comfortable  Respiratory system: Clear to auscultation. Respiratory effort normal. Cardiovascular system: S1 & S2 +. No rubs, gallops or clicks.  Gastrointestinal system: Abdomen is nondistended, soft and  nontender. Normal bowel sounds heard. Central nervous system: Alert and awake. Moves all extremities  Psychiatry: Judgement and insight appear abnormal. Mood & affect appropriate.     Data Reviewed: I have personally reviewed following labs and imaging studies  CBC: Recent Labs  Lab 02/09/21 1055 02/12/21 1733 02/13/21 0705 02/14/21 0604  WBC 5.5 5.6 5.2 4.6  NEUTROABS 3.3  --   --   --   HGB 12.1* 12.3* 12.2* 12.2*  HCT 35.6* 36.9* 37.7* 37.6*  MCV 87 90.4 91.7 90.4  PLT 288 225 185 836   Basic Metabolic Panel: Recent Labs  Lab 02/09/21 1055 02/12/21 1733 02/13/21 0705  NA 143 135 135  K 3.9 3.9 4.2  CL 104 100 101  CO2 22 25 27   GLUCOSE 104* 113* 99  BUN 15 22 15   CREATININE 1.22 1.26* 1.11  CALCIUM 8.9 8.9 8.9   GFR: Estimated Creatinine Clearance: 54.5 mL/min (by C-G formula based on SCr of 1.11 mg/dL). Liver Function Tests: Recent Labs  Lab 02/13/21 0705  AST 21  ALT 8  ALKPHOS 74  BILITOT 0.9  PROT 7.4  ALBUMIN 2.7*   No results for input(s): LIPASE, AMYLASE in the last 168 hours. No results for input(s): AMMONIA in the  last 168 hours. Coagulation Profile: No results for input(s): INR, PROTIME in the last 168 hours. Cardiac Enzymes: No results for input(s): CKTOTAL, CKMB, CKMBINDEX, TROPONINI in the last 168 hours. BNP (last 3 results) No results for input(s): PROBNP in the last 8760 hours. HbA1C: No results for input(s): HGBA1C in the last 72 hours. CBG: No results for input(s): GLUCAP in the last 168 hours. Lipid Profile: No results for input(s): CHOL, HDL, LDLCALC, TRIG, CHOLHDL, LDLDIRECT in the last 72 hours. Thyroid Function Tests: No results for input(s): TSH, T4TOTAL, FREET4, T3FREE, THYROIDAB in the last 72 hours. Anemia Panel: No results for input(s): VITAMINB12, FOLATE, FERRITIN, TIBC, IRON, RETICCTPCT in the last 72 hours. Sepsis Labs: No results for input(s): PROCALCITON, LATICACIDVEN in the last 168 hours.  Recent Results  (from the past 240 hour(s))  SARS CORONAVIRUS 2 (TAT 6-24 HRS) Nasopharyngeal Nasopharyngeal Swab     Status: None   Collection Time: 02/13/21 12:11 AM   Specimen: Nasopharyngeal Swab  Result Value Ref Range Status   SARS Coronavirus 2 NEGATIVE NEGATIVE Final    Comment: (NOTE) SARS-CoV-2 target nucleic acids are NOT DETECTED.  The SARS-CoV-2 RNA is generally detectable in upper and lower respiratory specimens during the acute phase of infection. Negative results do not preclude SARS-CoV-2 infection, do not rule out co-infections with other pathogens, and should not be used as the sole basis for treatment or other patient management decisions. Negative results must be combined with clinical observations, patient history, and epidemiological information. The expected result is Negative.  Fact Sheet for Patients: SugarRoll.be  Fact Sheet for Healthcare Providers: https://www.woods-mathews.com/  This test is not yet approved or cleared by the [place] FDA and  has been authorized for detection and/or diagnosis of SARS-CoV-2 by FDA under an Emergency Use Authorization (EUA). This EUA will remain  in effect (meaning this test can be used) for the duration of the COVID-19 declaration under Se ction 564(b)(1) of the Act, 21 U.S.C. section 360bbb-3(b)(1), unless the authorization is terminated or revoked sooner.  Performed at [facility], [place] [address], [place], [place] [address]          Radiology Studies: ECHOCARDIOGRAM COMPLETE  Result Date: 02/14/2021    ECHOCARDIOGRAM REPORT   Patient Name:   Ronnie Bullock Date of Exam: 02/14/2021 Medical Rec #:  [id number]        Height:       71.0 in Accession #:    [id number]       Weight:       150.0 lb Date of Birth:  05/08/1946        BSA:          1.866 m Patient Age:    75 years         BP:           127/99 mmHg Patient Gender: M                HR:           102 bpm. Exam Location:   [facility] Procedure: 2D Echo Indications:     Elevated Troponin  History:         Patient has no prior history of Echocardiogram examinations.  Sonographer:     Arville Go Referring Phys:  [id number] Nolberto Hanlon Diagnosing Phys: Buford Dresser MD IMPRESSIONS  1. Left ventricular ejection fraction, by estimation, is 65 to 70%. The left ventricle has normal function. The left ventricle has no regional wall motion  abnormalities. There is moderate concentric left ventricular hypertrophy. Left ventricular diastolic parameters are indeterminate.  2. Right ventricular systolic function is normal. The right ventricular size is normal. There is normal pulmonary artery systolic pressure.  3. The mitral valve is grossly normal. Trivial mitral valve regurgitation. No evidence of mitral stenosis.  4. The aortic valve is tricuspid. There is mild calcification of the aortic valve. There is mild thickening of the aortic valve. Aortic valve regurgitation is not visualized. No aortic stenosis is present.  5. The inferior vena cava is normal in size with greater than 50% respiratory variability, suggesting right atrial pressure of 3 mmHg. Comparison(s): No prior Echocardiogram. Conclusion(s)/Recommendation(s): Otherwise normal echocardiogram, with minor abnormalities described in the report. Normal to hyperdynamic LVEF, with small LV chamber size, small IVC that completely collapses. No prior for comparison, suggests possible hypovolemia, recommend clinical comparison. FINDINGS  Left Ventricle: Left ventricular ejection fraction, by estimation, is 65 to 70%. The left ventricle has normal function. The left ventricle has no regional wall motion abnormalities. The left ventricular internal cavity size was small. There is moderate  concentric left ventricular hypertrophy. Left ventricular diastolic parameters are indeterminate. Right Ventricle: The right ventricular size is normal. Right vetricular wall thickness was not well  visualized. Right ventricular systolic function is normal. There is normal pulmonary artery systolic pressure. The tricuspid regurgitant velocity is 2.06 m/s, and with an assumed right atrial pressure of 3 mmHg, the estimated right ventricular systolic pressure is 16.9 mmHg. Left Atrium: Left atrial size was normal in size. Right Atrium: Right atrial size was normal in size. Pericardium: There is no evidence of pericardial effusion. Mitral Valve: The mitral valve is grossly normal. There is mild thickening of the mitral valve leaflet(s). There is mild calcification of the mitral valve leaflet(s). Trivial mitral valve regurgitation. No evidence of mitral valve stenosis. Tricuspid Valve: The tricuspid valve is normal in structure. Tricuspid valve regurgitation is trivial. No evidence of tricuspid stenosis. Aortic Valve: The aortic valve is tricuspid. There is mild calcification of the aortic valve. There is mild thickening of the aortic valve. Aortic valve regurgitation is not visualized. No aortic stenosis is present. Aortic valve peak gradient measures 5.6 mmHg. Pulmonic Valve: The pulmonic valve was not well visualized. Pulmonic valve regurgitation is not visualized. No evidence of pulmonic stenosis. Aorta: The aortic root, ascending aorta, aortic arch and descending aorta are all structurally normal, with no evidence of dilitation or obstruction. Venous: The inferior vena cava is normal in size with greater than 50% respiratory variability, suggesting right atrial pressure of 3 mmHg. IAS/Shunts: The atrial septum is grossly normal.  LEFT VENTRICLE PLAX 2D LVIDd:         2.79 cm  Diastology LVIDs:         1.88 cm  LV e' medial:    3.37 cm/s LV PW:         1.39 cm  LV E/e' medial:  20.5 LV IVS:        1.28 cm  LV e' lateral:   9.46 cm/s LVOT diam:     2.00 cm  LV E/e' lateral: 7.3 LV SV:         39 LV SV Index:   21 LVOT Area:     3.14 cm  RIGHT VENTRICLE RV Basal diam:  3.32 cm RV S prime:     11.00 cm/s TAPSE  (M-mode): 1.5 cm LEFT ATRIUM             Index  RIGHT ATRIUM           Index LA diam:        1.60 cm 0.86 cm/m RA Area:     10.40 cm LA Vol (A2C):   8.5 ml  4.56 ml/m RA Volume:   21.10 ml  11.31 ml/m LA Vol (A4C):   17.0 ml 9.11 ml/m LA Biplane Vol: 12.9 ml 6.91 ml/m  AORTIC VALVE                PULMONIC VALVE AV Area (Vmax): 2.07 cm    PV Vmax:       0.94 m/s AV Vmax:        118.00 cm/s PV Peak grad:  3.5 mmHg AV Peak Grad:   5.6 mmHg LVOT Vmax:      77.70 cm/s LVOT Vmean:     46.500 cm/s LVOT VTI:       0.124 m  AORTA Ao Root diam: 3.50 cm MITRAL VALVE               TRICUSPID VALVE MV Area (PHT): 3.00 cm    TV Peak grad:   15.9 mmHg MV Decel Time: 253 msec    TV Vmax:        2.00 m/s MV E velocity: 69.00 cm/s  TR Peak grad:   17.0 mmHg MV A velocity: 90.80 cm/s  TR Vmax:        206.00 cm/s MV E/A ratio:  0.76                            SHUNTS                            Systemic VTI:  0.12 m                            Systemic Diam: 2.00 cm Buford Dresser MD Electronically signed by Buford Dresser MD Signature Date/Time: 02/14/2021/5:18:09 PM    Final         Scheduled Meds: . amLODipine  5 mg Oral Daily  . apixaban  5 mg Oral BID  . polyethylene glycol  17 g Oral Daily  . sodium chloride flush  3 mL Intravenous Q12H   Continuous Infusions:   LOS: 0 days    Time spent: 33 mins    Wyvonnia Dusky, MD [facility] Pager [phone]  If 7PM-7AM, please contact night-coverage 02/15/2021, 7:16 AM

## 2021-02-16 DIAGNOSIS — S0081XA Abrasion of other part of head, initial encounter: Secondary | ICD-10-CM | POA: Diagnosis not present

## 2021-02-16 DIAGNOSIS — R296 Repeated falls: Secondary | ICD-10-CM | POA: Diagnosis not present

## 2021-02-16 DIAGNOSIS — I82409 Acute embolism and thrombosis of unspecified deep veins of unspecified lower extremity: Secondary | ICD-10-CM | POA: Diagnosis not present

## 2021-02-16 DIAGNOSIS — Y92009 Unspecified place in unspecified non-institutional (private) residence as the place of occurrence of the external cause: Secondary | ICD-10-CM | POA: Diagnosis not present

## 2021-02-16 DIAGNOSIS — N5089 Other specified disorders of the male genital organs: Secondary | ICD-10-CM | POA: Diagnosis not present

## 2021-02-16 DIAGNOSIS — E785 Hyperlipidemia, unspecified: Secondary | ICD-10-CM | POA: Diagnosis not present

## 2021-02-16 DIAGNOSIS — R4182 Altered mental status, unspecified: Secondary | ICD-10-CM | POA: Diagnosis not present

## 2021-02-16 DIAGNOSIS — I248 Other forms of acute ischemic heart disease: Secondary | ICD-10-CM | POA: Diagnosis not present

## 2021-02-16 DIAGNOSIS — F0390 Unspecified dementia without behavioral disturbance: Secondary | ICD-10-CM | POA: Diagnosis not present

## 2021-02-16 DIAGNOSIS — I82402 Acute embolism and thrombosis of unspecified deep veins of left lower extremity: Secondary | ICD-10-CM | POA: Diagnosis not present

## 2021-02-16 DIAGNOSIS — C61 Malignant neoplasm of prostate: Secondary | ICD-10-CM | POA: Diagnosis not present

## 2021-02-16 DIAGNOSIS — R279 Unspecified lack of coordination: Secondary | ICD-10-CM | POA: Diagnosis not present

## 2021-02-16 DIAGNOSIS — I10 Essential (primary) hypertension: Secondary | ICD-10-CM | POA: Diagnosis not present

## 2021-02-16 DIAGNOSIS — S00212A Abrasion of left eyelid and periocular area, initial encounter: Secondary | ICD-10-CM | POA: Diagnosis not present

## 2021-02-16 DIAGNOSIS — R5381 Other malaise: Secondary | ICD-10-CM | POA: Diagnosis not present

## 2021-02-16 DIAGNOSIS — W19XXXA Unspecified fall, initial encounter: Secondary | ICD-10-CM | POA: Diagnosis not present

## 2021-02-16 DIAGNOSIS — K5900 Constipation, unspecified: Secondary | ICD-10-CM | POA: Diagnosis not present

## 2021-02-16 DIAGNOSIS — S0003XA Contusion of scalp, initial encounter: Secondary | ICD-10-CM | POA: Diagnosis not present

## 2021-02-16 DIAGNOSIS — N179 Acute kidney failure, unspecified: Secondary | ICD-10-CM | POA: Diagnosis not present

## 2021-02-16 DIAGNOSIS — I82403 Acute embolism and thrombosis of unspecified deep veins of lower extremity, bilateral: Secondary | ICD-10-CM | POA: Diagnosis not present

## 2021-02-16 DIAGNOSIS — Z20822 Contact with and (suspected) exposure to covid-19: Secondary | ICD-10-CM | POA: Diagnosis not present

## 2021-02-16 DIAGNOSIS — E569 Vitamin deficiency, unspecified: Secondary | ICD-10-CM | POA: Diagnosis not present

## 2021-02-16 DIAGNOSIS — M6281 Muscle weakness (generalized): Secondary | ICD-10-CM | POA: Diagnosis not present

## 2021-02-16 DIAGNOSIS — R748 Abnormal levels of other serum enzymes: Secondary | ICD-10-CM | POA: Diagnosis not present

## 2021-02-16 DIAGNOSIS — R32 Unspecified urinary incontinence: Secondary | ICD-10-CM | POA: Diagnosis not present

## 2021-02-16 DIAGNOSIS — R2681 Unsteadiness on feet: Secondary | ICD-10-CM | POA: Diagnosis not present

## 2021-02-16 DIAGNOSIS — Z8546 Personal history of malignant neoplasm of prostate: Secondary | ICD-10-CM | POA: Diagnosis not present

## 2021-02-16 LAB — CBC
HCT: 36.2 % — ABNORMAL LOW (ref 39.0–52.0)
Hemoglobin: 11.9 g/dL — ABNORMAL LOW (ref 13.0–17.0)
MCH: 29.8 pg (ref 26.0–34.0)
MCHC: 32.9 g/dL (ref 30.0–36.0)
MCV: 90.7 fL (ref 80.0–100.0)
Platelets: 210 10*3/uL (ref 150–400)
RBC: 3.99 MIL/uL — ABNORMAL LOW (ref 4.22–5.81)
RDW: 14.6 % (ref 11.5–15.5)
WBC: 5.1 10*3/uL (ref 4.0–10.5)
nRBC: 0 % (ref 0.0–0.2)

## 2021-02-16 LAB — RESP PANEL BY RT-PCR (FLU A&B, COVID) ARPGX2
Influenza A by PCR: NEGATIVE
Influenza B by PCR: NEGATIVE
SARS Coronavirus 2 by RT PCR: NEGATIVE

## 2021-02-16 LAB — BASIC METABOLIC PANEL
Anion gap: 5 (ref 5–15)
BUN: 22 mg/dL (ref 8–23)
CO2: 28 mmol/L (ref 22–32)
Calcium: 8.9 mg/dL (ref 8.9–10.3)
Chloride: 101 mmol/L (ref 98–111)
Creatinine, Ser: 1.28 mg/dL — ABNORMAL HIGH (ref 0.61–1.24)
GFR, Estimated: 58 mL/min — ABNORMAL LOW (ref >60)
Glucose, Bld: 105 mg/dL — ABNORMAL HIGH (ref 70–99)
Potassium: 4.5 mmol/L (ref 3.5–5.1)
Sodium: 134 mmol/L — ABNORMAL LOW (ref 135–145)

## 2021-02-16 NOTE — Discharge Summary (Signed)
Physician Discharge Summary  Ronnie Bullock UMP:[id number] DOB: 1946-01-07 DOA: 02/12/2021  PCP: [name], DO  Admit date: 02/12/2021 Discharge date: 02/16/2021  Admitted From: home  Disposition:  SNF   Recommendations for Outpatient Follow-up:  1. Follow up with PCP in 1-2 weeks   Home Health: no  Equipment/Devices:  Discharge Condition: stable  CODE STATUS: full  Diet recommendation: Regular   Brief/Interim Summary: HPI was taken from Dr. [place] Crane: Ronnie Bullock a 75 y.o.malewith medical history significant forprostate cancer,dementia,essential hypertension, hyperlipidemia, who [facility] with complaints of left lower extremity swelling.Patient has dementia and history is mainly obtained from his brother whom he lives with at bedside. Per his brother he has been having complaints of discomfort in his left lower extremity for the past month since he moved in with him and last night he noted significant swelling associated with tenderness to touch. Patient has been living a sedentary lifestyle. No reported fever orcardiopulmonary symptoms. Due to swelling and pain patient was brought into the ED for further evaluation.  Work-up in the ED revealed extensive left lower extremity DVTon venous duplex ultrasound. Vascular surgery was consulted by EDP, possiblethrombectomy on Friday, 01/22/2021. Recommended to start heparin drip. Pharmacy consulted for dosing adjustment.   ED Course:Afebrile, BP 149/401, heart rate 90, respiratory rate 16, respiratory96% on room air. Lab studies remarkable for creatinine 1.28 with baseline of 1.1, total bilirubin 1.6. Hemoglobin 5.9 with baseline of 13.5.   Hospital course from Dr. Jimmye Norman 4/11-4/12/22: Pt presented after fall and was found to have left frontal/supraorbital scalp hematoma. PT/OT evaluated the pt and recommended SNF. Pt and pt's family were agreeable to SNF. Furthermore, pt was found to have  AKI which was likely secondary to dehydration and was treated w/ IVFs. For more information, please see previous progress notes.   Discharge Diagnoses:  Active Problems:   DVT (deep venous thrombosis) (HCC)   Dementia without behavioral disturbance ([place])   Demand ischemia ([place])   Fall at home, initial encounter   Scalp hematoma, initial encounter  Fall: mechanical. PT recs SNF  Left frontal/supraorbital scalp hematoma: likely secondary mechanical fall. Continue w/ supportive care   AKI: secondary to dehydration. 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   amLODipine 5 MG tablet Commonly known as: NORVASC TAKE 1 TABLET (5 MG TOTAL) BY MOUTH DAILY.   apixaban 5 MG Tabs tablet Commonly known as: ELIQUIS Take 1 tablet (5 mg total) by mouth 2 (two) times daily.   multivitamin with minerals Tabs tablet Take 1 tablet by mouth daily.   polyethylene glycol powder 17 GM/SCOOP powder Commonly known as: GLYCOLAX/MIRALAX Take 17 g by mouth daily.   tamsulosin 0.4 MG Caps capsule Commonly known as: FLOMAX TAKE 1 CAPSULE BY MOUTH EVERY DAY  Contact information for after-discharge care    Destination    [facility] .   Service: Skilled Nursing Contact information: [address] [place] [phone]                 No  Known Allergies  Consultations:     Procedures/Studies: CT Head Wo Contrast  Result Date: 02/12/2021 CLINICAL DATA:  [facility].  Abrasions to head. EXAM: CT HEAD WITHOUT CONTRAST TECHNIQUE: Contiguous axial images were obtained from the base of the skull through the vertex without intravenous contrast. COMPARISON:  Head CT 12/02/2020 FINDINGS: Brain: Stable degree of atrophy and chronic small vessel ischemia. No intracranial hemorrhage, mass effect, or midline shift. No hydrocephalus. The basilar cisterns are patent. Chronic low-density involving left basal ganglia and inferior frontal lobe likely sequela of chronic ischemia, stable from prior. No evidence of territorial infarct or acute ischemia. No extra-axial or intracranial fluid collection. Vascular: Atherosclerosis of skullbase vasculature without hyperdense vessel or abnormal calcification. Skull: No fracture or focal lesion. Sinuses/Orbits: Left supraorbital scalp hematoma. Chronic opacification of the included paranasal sinuses, stable in appearance from prior. No mastoid effusion. Other: Left frontal/supraorbital scalp hematoma. IMPRESSION: 1. Left frontal/supraorbital scalp hematoma. No acute intracranial abnormality. No skull fracture. 2. Stable atrophy and chronic ischemia. 3. Chronic sinusitis. Electronically Signed   By: Keith Rake M.D.   On: 02/12/2021 17:32   CT ANGIO CHEST PE W OR WO CONTRAST  Result Date: 01/21/2021 CLINICAL DATA:  Left lower extremity DVT. EXAM: CT ANGIOGRAPHY CHEST WITH CONTRAST TECHNIQUE: Multidetector CT imaging of the chest was performed using the standard protocol during bolus administration of intravenous contrast. Multiplanar CT image reconstructions and MIPs were obtained to evaluate the vascular anatomy. CONTRAST:  54mL OMNIPAQUE IOHEXOL 350 MG/ML SOLN COMPARISON:  Chest x-ray from yesterday. FINDINGS: Cardiovascular: Satisfactory opacification of the pulmonary arteries to the segmental  level. No evidence of pulmonary embolism. Normal heart size. No pericardial effusion. No thoracic aortic aneurysm or dissection. Mediastinum/Nodes: No enlarged mediastinal, hilar, or axillary lymph nodes. Thyroid gland, trachea, and esophagus demonstrate no significant findings. Lungs/Pleura: Mild left and minimal right lower lobe subsegmental atelectasis. No focal consolidation, pleural effusion, or pneumothorax. No suspicious pulmonary nodule. Upper Abdomen: No acute abnormality. Musculoskeletal: No chest wall abnormality. No acute or significant osseous findings. Review of the MIP images confirms the above findings. IMPRESSION: 1. No evidence of pulmonary embolism. No acute intrathoracic process. Electronically Signed   By: Titus Dubin M.D.   On: 01/21/2021 15:13   CT Cervical Spine Wo Contrast  Result Date: 02/12/2021 CLINICAL DATA:  Mechanical fall [facility]. EXAM: CT CERVICAL SPINE WITHOUT CONTRAST TECHNIQUE: Multidetector CT imaging of the cervical spine was performed without intravenous contrast. Multiplanar CT image reconstructions were also generated. COMPARISON:  None. FINDINGS: Alignment: Broad-based reversal of normal lordosis. No traumatic subluxation. Skull base and vertebrae: No acute fracture. Vertebral body heights are maintained. The dens and skull base are intact. Soft tissues and spinal canal: No prevertebral fluid or swelling. No visible canal hematoma. Disc levels: Diffuse degenerative disc disease with disc space narrowing and endplate spurring. No high-grade canal stenosis. Upper chest: Patulous upper esophagus containing intraluminal fluid is well as retained secretions in the pharynx. No apical pneumothorax or rib fracture. Other: None. IMPRESSION: 1. No acute fracture or subluxation of the cervical spine. 2. Broad-based reversal of normal lordosis may be due to positioning or muscle spasm. 3. Diffuse degenerative disc disease. 4. Patulous upper  esophagus containing intraluminal fluid  is well as retained secretions in the pharynx. Electronically Signed   By: Keith Rake M.D.   On: 02/12/2021 17:36   PERIPHERAL VASCULAR CATHETERIZATION  Result Date: 01/22/2021 See Op Note  US Venous Img Lower Unilateral Left  Result Date: 01/20/2021 CLINICAL DATA:  75 year old with left leg swelling. EXAM: LEFT LOWER EXTREMITY VENOUS DOPPLER ULTRASOUND TECHNIQUE: Gray-scale sonography with graded compression, as well as color Doppler and duplex ultrasound were performed to evaluate the lower extremity deep venous systems from the level of the common femoral vein and including the common femoral, femoral, profunda femoral, popliteal and calf veins including the posterior tibial, peroneal and gastrocnemius veins when visible. The superficial great saphenous vein was also interrogated. Spectral Doppler was utilized to evaluate flow at rest and with distal augmentation maneuvers in the common femoral, femoral and popliteal veins. COMPARISON:  None. FINDINGS: Contralateral Common Femoral Vein: No thrombus. Normal compressibility and color Doppler flow. Common Femoral Vein: Partial compressibility of the left great common femoral vein near the saphenofemoral junction with nonocclusive thrombus in the left common femoral vein. Saphenofemoral Junction: Patent Profunda Femoral Vein: Positive for echogenic thrombus. Femoral Vein: Positive for thrombus. Echogenic thrombus in the left femoral vein without significant flow. Popliteal Vein: Positive for echogenic thrombus. Evidence for occlusive thrombus. Calf Veins: Positive for thrombus. No significant flow identified in the left posterior tibial or left peroneal veins. Other Findings:  None. IMPRESSION: Positive for deep venous thrombosis in the left lower extremity. Thrombus involves the distal left common femoral vein, left femoral vein, left popliteal vein and left deep calf veins. Occlusive thrombus involving the left femoral vein, left popliteal vein and  left deep calf veins. These results were called by telephone at the time of interpretation on 01/20/2021 at 12:13 pm to provider Merlyn Lot , who verbally acknowledged these results. Electronically Signed   By: Markus Daft M.D.   On: 01/20/2021 12:15   DG Chest Portable 1 View  Result Date: 01/20/2021 CLINICAL DATA:  Left lower extremity swelling. EXAM: PORTABLE CHEST 1 VIEW COMPARISON:  None. FINDINGS: The heart size and mediastinal contours are within normal limits. Both lungs are clear. The visualized skeletal structures are unremarkable. IMPRESSION: No active disease. Electronically Signed   By: Marijo Conception M.D.   On: 01/20/2021 11:56   ECHOCARDIOGRAM COMPLETE  Result Date: 02/14/2021    ECHOCARDIOGRAM REPORT   Patient Name:   Ronnie Bullock Date of Exam: 02/14/2021 Medical Rec #:  [id number]        Height:       71.0 in Accession #:    [id number]       Weight:       150.0 lb Date of Birth:  [birth year]        BSA:          1.866 m Patient Age:    49 years         BP:           127/99 mmHg Patient Gender: M                HR:           102 bpm. Exam Location:  [facility] Procedure: 2D Echo Indications:     Elevated Troponin  History:         Patient has no prior history of Echocardiogram examinations.  Sonographer:     Arville Go Referring Phys:  [id number] [place] Diagnosing Phys: Buford Dresser  MD IMPRESSIONS  1. Left ventricular ejection fraction, by estimation, is 65 to 70%. The left ventricle has normal function. The left ventricle has no regional wall motion abnormalities. There is moderate concentric left ventricular hypertrophy. Left ventricular diastolic parameters are indeterminate.  2. Right ventricular systolic function is normal. The right ventricular size is normal. There is normal pulmonary artery systolic pressure.  3. The mitral valve is grossly normal. Trivial mitral valve regurgitation. No evidence of mitral stenosis.  4. The aortic valve is tricuspid. There is mild  calcification of the aortic valve. There is mild thickening of the aortic valve. Aortic valve regurgitation is not visualized. No aortic stenosis is present.  5. The inferior vena cava is normal in size with greater than 50% respiratory variability, suggesting right atrial pressure of 3 mmHg. Comparison(s): No prior Echocardiogram. Conclusion(s)/Recommendation(s): Otherwise normal echocardiogram, with minor abnormalities described in the report. Normal to hyperdynamic LVEF, with small LV chamber size, small IVC that completely collapses. No prior for comparison, suggests possible hypovolemia, recommend clinical comparison. FINDINGS  Left Ventricle: Left ventricular ejection fraction, by estimation, is 65 to 70%. The left ventricle has normal function. The left ventricle has no regional wall motion abnormalities. The left ventricular internal cavity size was small. There is moderate  concentric left ventricular hypertrophy. Left ventricular diastolic parameters are indeterminate. Right Ventricle: The right ventricular size is normal. Right vetricular wall thickness was not well visualized. Right ventricular systolic function is normal. There is normal pulmonary artery systolic pressure. The tricuspid regurgitant velocity is 2.06 m/s, and with an assumed right atrial pressure of 3 mmHg, the estimated right ventricular systolic pressure is 15.1 mmHg. Left Atrium: Left atrial size was normal in size. Right Atrium: Right atrial size was normal in size. Pericardium: There is no evidence of pericardial effusion. Mitral Valve: The mitral valve is grossly normal. There is mild thickening of the mitral valve leaflet(s). There is mild calcification of the mitral valve leaflet(s). Trivial mitral valve regurgitation. No evidence of mitral valve stenosis. Tricuspid Valve: The tricuspid valve is normal in structure. Tricuspid valve regurgitation is trivial. No evidence of tricuspid stenosis. Aortic Valve: The aortic valve is  tricuspid. There is mild calcification of the aortic valve. There is mild thickening of the aortic valve. Aortic valve regurgitation is not visualized. No aortic stenosis is present. Aortic valve peak gradient measures 5.6 mmHg. Pulmonic Valve: The pulmonic valve was not well visualized. Pulmonic valve regurgitation is not visualized. No evidence of pulmonic stenosis. Aorta: The aortic root, ascending aorta, aortic arch and descending aorta are all structurally normal, with no evidence of dilitation or obstruction. Venous: The inferior vena cava is normal in size with greater than 50% respiratory variability, suggesting right atrial pressure of 3 mmHg. IAS/Shunts: The atrial septum is grossly normal.  LEFT VENTRICLE PLAX 2D LVIDd:         2.79 cm  Diastology LVIDs:         1.88 cm  LV e' medial:    3.37 cm/s LV PW:         1.39 cm  LV E/e' medial:  20.5 LV IVS:        1.28 cm  LV e' lateral:   9.46 cm/s LVOT diam:     2.00 cm  LV E/e' lateral: 7.3 LV SV:         39 LV SV Index:   21 LVOT Area:     3.14 cm  RIGHT VENTRICLE RV Basal diam:  3.32 cm RV  S prime:     11.00 cm/s TAPSE (M-mode): 1.5 cm LEFT ATRIUM             Index      RIGHT ATRIUM           Index LA diam:        1.60 cm 0.86 cm/m RA Area:     10.40 cm LA Vol (A2C):   8.5 ml  4.56 ml/m RA Volume:   21.10 ml  11.31 ml/m LA Vol (A4C):   17.0 ml 9.11 ml/m LA Biplane Vol: 12.9 ml 6.91 ml/m  AORTIC VALVE                PULMONIC VALVE AV Area (Vmax): 2.07 cm    PV Vmax:       0.94 m/s AV Vmax:        118.00 cm/s PV Peak grad:  3.5 mmHg AV Peak Grad:   5.6 mmHg LVOT Vmax:      77.70 cm/s LVOT Vmean:     46.500 cm/s LVOT VTI:       0.124 m  AORTA Ao Root diam: 3.50 cm MITRAL VALVE               TRICUSPID VALVE MV Area (PHT): 3.00 cm    TV Peak grad:   15.9 mmHg MV Decel Time: 253 msec    TV Vmax:        2.00 m/s MV E velocity: 69.00 cm/s  TR Peak grad:   17.0 mmHg MV A velocity: 90.80 cm/s  TR Vmax:        206.00 cm/s MV E/A ratio:  0.76                             SHUNTS                            Systemic VTI:  0.12 m                            Systemic Diam: 2.00 cm Buford Dresser MD Electronically signed by Buford Dresser MD Signature Date/Time: 02/14/2021/5:18:09 PM    Final    VAS [place] LOWER EXTREMITY VENOUS (DVT)  Result Date: 02/04/2021  Lower Venous DVT Study Indications: Pain, and Swelling. Other Indications: 01/22/2021 left thrombectomy cfv to ptv. Risk Factors: DVT left. Performing Technologist: Concha [place] RVT  Examination Guidelines: A complete evaluation includes B-mode imaging, spectral Doppler, color Doppler, and power Doppler as needed of all accessible portions of each vessel. Bilateral testing is considered an integral part of a complete examination. Limited examinations for reoccurring indications may be performed as noted. The reflux portion of the exam is performed with the patient in reverse Trendelenburg.  +------+---------------+---------+-----------+------------------+--------------+ RIGHT CompressibilityPhasicitySpontaneityProperties        Thrombus Aging +------+---------------+---------+-----------+------------------+--------------+ CFV   Full           Yes      Yes                                         +------+---------------+---------+-----------+------------------+--------------+ SFJ   None           No       No         brightly echogenic               +------+---------------+---------+-----------+------------------+--------------+  FV MidFull           Yes      Yes                                         +------+---------------+---------+-----------+------------------+--------------+ POP   Full           Yes      Yes                                         +------+---------------+---------+-----------+------------------+--------------+ PTV   Full           Yes      Yes                                          +------+---------------+---------+-----------+------------------+--------------+ PERO  Full           Yes      Yes                                         +------+---------------+---------+-----------+------------------+--------------+   +---------+---------------+---------+-----------+----------+--------------+ LEFT     CompressibilityPhasicitySpontaneityPropertiesThrombus Aging +---------+---------------+---------+-----------+----------+--------------+ CFV      Full           Yes      Yes                                 +---------+---------------+---------+-----------+----------+--------------+ SFJ      Full           Yes      Yes                                 +---------+---------------+---------+-----------+----------+--------------+ FV Prox  Partial                                                     +---------+---------------+---------+-----------+----------+--------------+ FV Mid   Partial                                                     +---------+---------------+---------+-----------+----------+--------------+ FV DistalNone                                                        +---------+---------------+---------+-----------+----------+--------------+ PFV      Full           Yes      Yes                                 +---------+---------------+---------+-----------+----------+--------------+  POP      Partial                                                     +---------+---------------+---------+-----------+----------+--------------+ PTV      None                                                        +---------+---------------+---------+-----------+----------+--------------+ PERO     None                                                        +---------+---------------+---------+-----------+----------+--------------+ GSV      Full           Yes      Yes                                  +---------+---------------+---------+-----------+----------+--------------+     Summary: RIGHT: - 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 - There is no evidence of deep vein thrombosis in the lower extremity. However, portions of this examination were limited- see technologist comments above.  - The SVT is in the SFJ and 4cm into proximal GSV then resoves. The SVT appears chronic in nature.  LEFT: - Findings consistent with acute deep vein thrombosis involving the left femoral vein, left popliteal vein, left posterior tibial veins, and left peroneal veins. - Minimal thrombus resolution status post thrombectomy with minimal recanalized flow in the proximal to mid SFV,. No flow seen in the distal SFV, popliteal vein, and calf vessels.  *See table(s) above for measurements and observations. Electronically signed by Hortencia Pilar MD on 02/04/2021 at 4:34:06 PM.    Final        Subjective: Pt c/o fatigue    Discharge Exam: Vitals:   02/16/21 0528 02/16/21 0709  BP: (!) 112/91 124/88  Pulse: 61 86  Resp: 18 18  Temp: 97.7 F (36.5 C) (!) 97.5 F (36.4 C)  SpO2: 99% 96%   Vitals:   02/15/21 2137 02/15/21 2334 02/16/21 0528 02/16/21 0709  BP: 125/83 (!) 118/92 (!) 112/91 124/88  Pulse: 80 86 61 86  Resp: 17 18 18 18   Temp: 98.1 F (36.7 C) 98.7 F (37.1 C) 97.7 F (36.5 C) (!) 97.5 F (36.4 C)  TempSrc: Oral Oral Oral Oral  SpO2: 99% 100% 99% 96%  Weight:      Height:        General: Pt is alert, awake, not in acute distress Cardiovascular: S1/S2 +, no rubs, no gallops Respiratory: CTA bilaterally, no wheezing, no rhonchi Abdominal: Soft, NT, ND, bowel sounds + Extremities:  no cyanosis    The results of significant diagnostics from this hospitalization (including imaging, microbiology, ancillary and laboratory) are listed below for reference.     Microbiology: Recent Results (from the past 240 hour(s))  SARS  CORONAVIRUS 2 (TAT 6-24 HRS) Nasopharyngeal Nasopharyngeal Swab     Status:  None   Collection Time: 02/13/21 12:11 AM   Specimen: Nasopharyngeal Swab  Result Value Ref Range Status   SARS Coronavirus 2 NEGATIVE NEGATIVE Final    Comment: (NOTE) SARS-CoV-2 target nucleic acids are NOT DETECTED.  The SARS-CoV-2 RNA is generally detectable in upper and lower respiratory specimens during the acute phase of infection. Negative results do not preclude SARS-CoV-2 infection, do not rule out co-infections with other pathogens, and should not be used as the sole basis for treatment or other patient management decisions. Negative results must be combined with clinical observations, patient history, and epidemiological information. The expected result is Negative.  Fact Sheet for Patients: SugarRoll.be  Fact Sheet for Healthcare Providers: https://www.woods-mathews.com/  This test is not yet approved or cleared by the [place] FDA and  has been authorized for detection and/or diagnosis of SARS-CoV-2 by FDA under an Emergency Use Authorization (EUA). This EUA will remain  in effect (meaning this test can be used) for the duration of the COVID-19 declaration under Se ction 564(b)(1) of the Act, 21 U.S.C. section 360bbb-3(b)(1), unless the authorization is terminated or revoked sooner.  Performed at [facility], [place] [address] [place], [place] [address]      Labs: BNP (last 3 results) No results for input(s): BNP in the last 8760 hours. Basic Metabolic Panel: Recent Labs  Lab 02/09/21 1055 02/12/21 1733 02/13/21 0705 02/15/21 0732 02/16/21 0549  NA 143 135 135 135 134*  K 3.9 3.9 4.2 4.1 4.5  CL 104 100 101 102 101  CO2 22 25 27 27 28   GLUCOSE 104* 113* 99 96 105*  BUN 15 22 15 21 22   CREATININE 1.22 1.26* 1.11 1.19 1.28*  CALCIUM 8.9 8.9 8.9 8.6* 8.9   Liver Function Tests: Recent Labs  Lab 02/13/21 0705  AST 21  ALT  8  ALKPHOS 74  BILITOT 0.9  PROT 7.4  ALBUMIN 2.7*   No results for input(s): LIPASE, AMYLASE in the last 168 hours. No results for input(s): AMMONIA in the last 168 hours. CBC: Recent Labs  Lab 02/09/21 1055 02/12/21 1733 02/13/21 0705 02/14/21 0604 02/15/21 0732 02/16/21 0549  WBC 5.5 5.6 5.2 4.6 4.6 5.1  NEUTROABS 3.3  --   --   --   --   --   HGB 12.1* 12.3* 12.2* 12.2* 11.1* 11.9*  HCT 35.6* 36.9* 37.7* 37.6* 34.1* 36.2*  MCV 87 90.4 91.7 90.4 90.2 90.7  PLT 288 225 185 205 198 210   Cardiac Enzymes: No results for input(s): CKTOTAL, CKMB, CKMBINDEX, TROPONINI in the last 168 hours. BNP: Invalid input(s): POCBNP CBG: No results for input(s): GLUCAP in the last 168 hours. D-Dimer No results for input(s): DDIMER in the last 72 hours. Hgb A1c No results for input(s): HGBA1C in the last 72 hours. Lipid Profile No results for input(s): CHOL, HDL, LDLCALC, TRIG, CHOLHDL, LDLDIRECT in the last 72 hours. Thyroid function studies No results for input(s): TSH, T4TOTAL, T3FREE, THYROIDAB in the last 72 hours.  Invalid input(s): FREET3 Anemia work up No results for input(s): VITAMINB12, FOLATE, FERRITIN, TIBC, IRON, RETICCTPCT in the last 72 hours. Urinalysis    Component Value Date/Time   COLORURINE YELLOW (A) 02/12/2021 1658   APPEARANCEUR HAZY (A) 02/12/2021 1658   APPEARANCEUR Cloudy (A) 11/12/2020 1233   LABSPEC 1.017 02/12/2021 1658   PHURINE 5.0 02/12/2021 1658   GLUCOSEU 50 (A) 02/12/2021 1658   HGBUR NEGATIVE 02/12/2021 1658   BILIRUBINUR NEGATIVE 02/12/2021 1658   BILIRUBINUR Negative  11/12/2020 1233   [place] 02/12/2021 1658   PROTEINUR 100 (A) 02/12/2021 1658   UROBILINOGEN 0.2 10/30/2020 1403   NITRITE NEGATIVE 02/12/2021 1658   LEUKOCYTESUR NEGATIVE 02/12/2021 1658   Sepsis Labs Invalid input(s): PROCALCITONIN,  WBC,  LACTICIDVEN Microbiology Recent Results (from the past 240 hour(s))  SARS CORONAVIRUS 2 (TAT 6-24 HRS) Nasopharyngeal  Nasopharyngeal Swab     Status: None   Collection Time: 02/13/21 12:11 AM   Specimen: Nasopharyngeal Swab  Result Value Ref Range Status   SARS Coronavirus 2 NEGATIVE NEGATIVE Final    Comment: (NOTE) SARS-CoV-2 target nucleic acids are NOT DETECTED.  The SARS-CoV-2 RNA is generally detectable in upper and lower respiratory specimens during the acute phase of infection. Negative results do not preclude SARS-CoV-2 infection, do not rule out co-infections with other pathogens, and should not be used as the sole basis for treatment or other patient management decisions. Negative results must be combined with clinical observations, patient history, and epidemiological information. The expected result is Negative.  Fact Sheet for Patients: SugarRoll.be  Fact Sheet for Healthcare Providers: https://www.woods-mathews.com/  This test is not yet approved or cleared by the [place] FDA and  has been authorized for detection and/or diagnosis of SARS-CoV-2 by FDA under an Emergency Use Authorization (EUA). This EUA will remain  in effect (meaning this test can be used) for the duration of the COVID-19 declaration under Se ction 564(b)(1) of the Act, 21 U.S.C. section 360bbb-3(b)(1), unless the authorization is terminated or revoked sooner.  Performed at [facility], [place] [address], [place], [place] [address]      Time coordinating discharge: Over 30 minutes  SIGNED:   Wyvonnia Dusky, MD  [facility] 02/16/2021, 10:51 AM Pager   If 7PM-7AM, please contact night-coverage

## 2021-02-16 NOTE — Progress Notes (Addendum)
This Probation officer called and gave patient report for discharge to [facility] room [phone] and spoke w Casey Burkitt. No further concerns noted. Patient's brother [facility] notified of approximate discharge time.

## 2021-02-16 NOTE — Progress Notes (Signed)
Physical Therapy Treatment Patient Details Name: Ronnie Bullock MRN: [id number] DOB: [birth year] Today's Date: 02/16/2021    History of Present Illness 75 yo male with a fall outdoors was admitted to hosp with contusion to L eye, noted demand ischemia after being lightheaded prior to fall.  Pt is on blood thinner, had recent DVt and has elevation of HR.  PMHx:  dementia, HLD, HTN, incontinence, colon CA, orthostatic,    PT Comments    Pt is making good progress towards goals with ability to ambulate around RN station. Good endurance with sitting/standing there-ex. Good endurance with ambulation, does well with RW vs without RW. Will continue to progress as able.   Follow Up Recommendations  SNF     Equipment Recommendations  Rolling walker with 5" wheels    Recommendations for Other Services       Precautions / Restrictions Precautions Precautions: Fall Restrictions Weight Bearing Restrictions: No    Mobility  Bed Mobility Overal bed mobility: Needs Assistance Bed Mobility: Supine to Sit;Sit to Supine     Supine to sit: Min guard Sit to supine: Min guard   General bed mobility comments: safe technique with upright posture once at EOB. Once seated, able to sit with good balance while donning socks    Transfers Overall transfer level: Needs assistance Equipment used: Rolling walker (2 wheeled) Transfers: Sit to/from Stand Sit to Stand: Min guard         General transfer comment: Safe technique with upright posture. Slightly forward flexed posture with safe technique using RW  Ambulation/Gait Ambulation/Gait assistance: Min guard Gait Distance (Feet): 200 Feet Assistive device: Rolling walker (2 wheeled);1 person hand held assist Gait Pattern/deviations: Step-through pattern     General Gait Details: ambulated around loop once with RW and once without RW. Pt with decreased balance without RW with staggering steps along with forward flexed posture. IMproved  technique with use of RW. Safe technique   Stairs             Wheelchair Mobility    Modified Rankin (Stroke Patients Only)       Balance Overall balance assessment: Needs assistance Sitting-balance support: Feet supported Sitting balance-Leahy Scale: Good     Standing balance support: Bilateral upper extremity supported;During functional activity Standing balance-Leahy Scale: Good                              Cognition Arousal/Alertness: Awake/alert Behavior During Therapy: WFL for tasks assessed/performed Overall Cognitive Status: No family/caregiver present to determine baseline cognitive functioning                                        Exercises Other Exercises Other Exercises: seated/standing ther-ex performed on B LE including heel raises, hip abd/add, alt. marching, and LAQ. ALl ther-ex performed x 15 reps with supervision.    General Comments        Pertinent Vitals/Pain Pain Assessment: No/denies pain    Home Living                      Prior Function            PT Goals (current goals can now be found in the care plan section) Acute Rehab PT Goals Patient Stated Goal: feel better, get home PT Goal Formulation: With patient Time For Goal Achievement:  02/27/21 Potential to Achieve Goals: Good Progress towards PT goals: Progressing toward goals    Frequency    Min 2X/week      PT Plan Current plan remains appropriate    Co-evaluation              AM-PAC PT "6 Clicks" Mobility   Outcome Measure  Help needed turning from your back to your side while in a flat bed without using bedrails?: None Help needed moving from lying on your back to sitting on the side of a flat bed without using bedrails?: None Help needed moving to and from a bed to a chair (including a wheelchair)?: A Little Help needed standing up from a chair using your arms (e.g., wheelchair or bedside chair)?: A Little Help  needed to walk in hospital room?: A Little Help needed climbing 3-5 steps with a railing? : A Little 6 Click Score: 20    End of Session Equipment Utilized During Treatment: Gait belt Activity Tolerance: Patient limited by pain;Other (comment) Patient left: in bed;with call bell/phone within reach;with bed alarm set Nurse Communication: Mobility status PT Visit Diagnosis: Unsteadiness on feet (R26.81);Muscle weakness (generalized) (M62.81);Difficulty in walking, not elsewhere classified (R26.2);Pain Pain - Right/Left: Left Pain - part of body: Leg     Time: 6153-7943 PT Time Calculation (min) (ACUTE ONLY): 23 min  Charges:  $Gait Training: 8-22 mins $Therapeutic Exercise: 8-22 mins                     Greggory Stallion, PT, DPT [phone]    Ronnie Bullock 02/16/2021, 1:18 PM

## 2021-02-16 NOTE — TOC Progression Note (Signed)
Transition of Care [facility]) - Progression Note    Patient Details  Name: Ronnie Bullock MRN: [id number] Date of Birth: [birth year]  Transition of Care [facility]) CM/SW Contact  Su Hilt, RN Phone Number: 02/16/2021, 8:49 AM  Clinical Narrative:   Hilda Blades with [place] notified me that they have auth approval, I notified the physcian    Expected Discharge Plan: [place] Barriers to Discharge: Continued Medical Work up  Expected Discharge Plan and Services Expected Discharge Plan: [place]   Discharge Planning Services: CM Consult   Living arrangements for the past 2 months: Single Family Home                                       Social Determinants of Health (SDOH) Interventions    Readmission Risk Interventions No flowsheet data found.

## 2021-02-16 NOTE — Plan of Care (Signed)

## 2021-02-16 NOTE — Plan of Care (Signed)

## 2021-02-16 NOTE — Plan of Care (Signed)
Discharge to [facility] transport time approximately [phone]

## 2021-02-17 ENCOUNTER — Ambulatory Visit: Payer: Medicare HMO | Admitting: Urology

## 2021-02-17 ENCOUNTER — Encounter: Payer: Self-pay | Admitting: Urology

## 2021-02-17 DIAGNOSIS — K5900 Constipation, unspecified: Secondary | ICD-10-CM | POA: Diagnosis not present

## 2021-02-17 DIAGNOSIS — C61 Malignant neoplasm of prostate: Secondary | ICD-10-CM | POA: Diagnosis not present

## 2021-02-17 DIAGNOSIS — I82402 Acute embolism and thrombosis of unspecified deep veins of left lower extremity: Secondary | ICD-10-CM | POA: Diagnosis not present

## 2021-02-17 DIAGNOSIS — I10 Essential (primary) hypertension: Secondary | ICD-10-CM | POA: Diagnosis not present

## 2021-02-17 DIAGNOSIS — F0390 Unspecified dementia without behavioral disturbance: Secondary | ICD-10-CM | POA: Diagnosis not present

## 2021-02-17 DIAGNOSIS — E569 Vitamin deficiency, unspecified: Secondary | ICD-10-CM | POA: Diagnosis not present

## 2021-02-17 DIAGNOSIS — R32 Unspecified urinary incontinence: Secondary | ICD-10-CM | POA: Diagnosis not present

## 2021-02-17 DIAGNOSIS — R296 Repeated falls: Secondary | ICD-10-CM | POA: Diagnosis not present

## 2021-02-17 DIAGNOSIS — N5089 Other specified disorders of the male genital organs: Secondary | ICD-10-CM | POA: Diagnosis not present

## 2021-02-18 ENCOUNTER — Telehealth: Payer: Self-pay | Admitting: Radiation Oncology

## 2021-02-18 NOTE — Telephone Encounter (Signed)
Tracey the scheduler for [facility] called to try and coordinate appointments for pt.  She states that he already has two appointments the morning of 9/29 and would like to reschedule his lab appointment with [place].  She is unsure if that will result in needing to move the 10/6 follow up to lab.  Routing to [facility] for follow up.

## 2021-02-21 NOTE — Progress Notes (Shared)
02/21/2021  3:07 PM   Ronnie Bullock 07/23/1946 [id number]  Referring provider: [name], DO [place],  [place] [phone] No chief complaint on file.   Urological History    HPI: Ronnie Bullock is a 75 y.o. male with a personal history of prostate cancer, lower urinary tract symptoms, and a right inguinal hernia, who presents today for evaluation and management of swollen testicles and discussion of medications.   - Patient is at intermediate risk for prostate cancer  - Diagnosed in 06/2019 with prostate cancer, his DRE showed prostate volume of 63 cc, his PSA was 5.7. PSA has continued to drop since then.  PSA Trend: - 05/07/2018: 4.5 - 09/19/2018 : 4.7 - 01/01/2019: 5.7 - 01/23/2020: 2.4 - 02/05/2020: 1.89 - 08/05/2020: 1.03   PMH: Past Medical History:  Diagnosis Date  . Dementia ([place])   . Dementia ([place])   . Hyperlipidemia   . Hypertension   . Incontinence     Surgical History: Past Surgical History:  Procedure Laterality Date  . COLONOSCOPY WITH PROPOFOL N/A 05/16/2018   Procedure: COLONOSCOPY WITH PROPOFOL;  Surgeon: Virgel Manifold, MD;  Location: [facility] ENDOSCOPY;  Service: Endoscopy;  Laterality: N/A;  . PERIPHERAL VASCULAR THROMBECTOMY Left 01/22/2021   Procedure: PERIPHERAL VASCULAR THROMBECTOMY / THROMBOLYSIS;  Surgeon: Katha Cabal, MD;  Location: [place] [facility];  Service: Cardiovascular;  Laterality: Left;    Home Medications:  Allergies as of 02/22/2021   No Known Allergies     Medication List       Accurate as of February 21, 2021  3:07 PM. If you have any questions, ask your nurse or doctor.        acetaminophen 325 MG tablet Commonly known as: TYLENOL Take 2 tablets (650 mg total) by mouth every 6 (six) hours as needed for fever, headache or mild pain.   amLODipine 5 MG tablet Commonly known as: NORVASC TAKE 1 TABLET (5 MG TOTAL) BY MOUTH DAILY.   apixaban 5 MG Tabs tablet Commonly known as: ELIQUIS Take 1  tablet (5 mg total) by mouth 2 (two) times daily.   multivitamin with minerals Tabs tablet Take 1 tablet by mouth daily.   polyethylene glycol powder 17 GM/SCOOP powder Commonly known as: GLYCOLAX/MIRALAX Take 17 g by mouth daily.   tamsulosin 0.4 MG Caps capsule Commonly known as: FLOMAX TAKE 1 CAPSULE BY MOUTH EVERY DAY       Allergies: No Known Allergies  Family History: Family History  Problem Relation Age of Onset  . Hypertension Mother   . Prostate cancer Neg Hx   . Bladder Cancer Neg Hx   . Kidney cancer Neg Hx     Social History:   reports that he has never smoked. He has never used smokeless tobacco. He reports that he does not drink alcohol and does not use drugs.  ROS: Pertinent ROS in HPI.  Physical Exam: There were no vitals taken for this visit.  Constitutional:  Alert and oriented, No acute distress. HEENT: [place] AT, moist mucus membranes.  Trachea midline, no masses. Cardiovascular: No clubbing, cyanosis, or edema. Respiratory: Normal respiratory effort, no increased work of breathing. GI: ***Abdomen is soft, non tender, non distended, no abdominal masses. Liver and spleen not palpable.  No hernias appreciated.  Stool sample for occult testing is not indicated. GU: ***No CVA tenderness.  No bladder fullness or masses.  Patient with uncircumcised phallus.  Foreskin easily retracted.   Urethral meatus is  patent.  No penile discharge. No penile lesions or rashes. Scrotum without lesions, cysts, rashes and/or edema.  Testicles are located scrotally bilaterally. No masses are appreciated in the testicles. Left and right epididymis are normal. Rectal: Prostate is *** grams, no nodules, non-tender, with normal sphincter tone. Skin: No rashes, bruises or suspicious lesions. Neurologic: Grossly intact, no focal deficits, moving all 4 extremities. Psychiatric: Normal mood and affect.  Laboratory Data:  BMP from 02/16/2021 Component Ref Range & Units 5 d ago 6 d ago  8 d ago 9 d ago 12 d ago 4 wk ago 1 mo ago  Sodium 135 - 145 mmol/L 134Low  135  135  135  143 R  133Low  135   Potassium 3.5 - 5.1 mmol/L 4.5  4.1  4.2  3.9  3.9 R  4.0  3.8   Chloride 98 - 111 mmol/L 101  102  101  100  104 R  104  104   CO2 22 - 32 mmol/L 28  27  27  25  22  R  24  25   Glucose, Bld 70 - 99 mg/dL 105High  96 CM  99 CM  113High CM  104High R  106High CM  111High CM   Comment: Glucose reference range applies only to samples taken after fasting for at least 8 hours.  BUN 8 - 23 mg/dL 22  21  15  22  15  R  17  14   Creatinine, Ser 0.61 - 1.24 mg/dL 1.28High  1.19  1.11  1.26High  1.22 R  1.04  0.97   Calcium 8.9 - 10.3 mg/dL 8.9  8.6Low  8.9  8.9  8.9 R  7.9Low  8.3Low   GFR, Estimated >60 mL/min 58Low  >60 CM  >60 CM  59Low CM   >60 CM  >60 CM   Comment: (NOTE)  Calculated using the CKD-EPI Creatinine Equation (2021)   Anion gap 5 - 15 5  6  CM  7 CM  10 CM   5 CM  6 CM    CBC from 02/16/2021 Component Ref Range & Units 5 d ago 6 d ago 7 d ago 8 d ago 9 d ago 12 d ago 3 wk ago  WBC 4.0 - 10.5 K/uL 5.1  4.6  4.6  5.2  5.6  5.5 R  6.9   RBC 4.22 - 5.81 MIL/uL 3.99Low  3.78Low  4.16Low  4.11Low  4.08Low  4.08Low R  3.57Low   Hemoglobin 13.0 - 17.0 g/dL 11.9Low  11.1Low  12.2Low  12.2Low  12.3Low  12.1Low R  10.7Low   HCT 39.0 - 52.0 % 36.2Low  34.1Low  37.6Low  37.7Low  36.9Low  35.6Low R  32.5Low   MCV 80.0 - 100.0 fL 90.7  90.2  90.4  91.7  90.4  87 R  91.0   MCH 26.0 - 34.0 pg 29.8  29.4  29.3  29.7  30.1  29.7 R  30.0   MCHC 30.0 - 36.0 g/dL 32.9  32.6  32.4  32.4  33.3  34.0 R  32.9   RDW 11.5 - 15.5 % 14.6  14.6  14.6  14.6  14.6  13.2 R  13.9   Platelets 150 - 400 K/uL 210  198  205  185  225  288 R  326   nRBC 0.0 - 0.2 % 0.0  0.0 CM  0.0 CM  0.0 CM  0.0 CM   0.0 CM  Troponin from 02/13/2021: Component Ref Range & Units 8 d ago  (02/13/21) 8 d ago  (02/13/21) 9 d ago  (02/12/21) 9 d ago  (02/12/21) 9 d  ago  (02/12/21)  Troponin I (High Sensitivity) <18 ng/L 36High  45High CM  46High CM  35High CM  20High CM        Lab Results  Component Value Date   CREATININE 1.28 (H) 02/16/2021   CREATININE 1.19 02/15/2021   CREATININE 1.11 02/13/2021        Urinalysis  UA, complete w microscopic urine, clean catch: Most recent from 02/12/2021: Component Ref Range & Units 9 d ago  (02/12/21) 2 mo ago  (12/02/20) 3 mo ago  (11/12/20) 3 mo ago  (11/12/20) 3 mo ago  (11/03/20) 3 mo ago  (11/03/20) 1 yr ago  (01/23/20) 1 yr ago  (01/23/20)  Color, Urine YELLOW YELLOWAbnormal  YELLOWAbnormal         APPearance CLEAR HAZYAbnormal  CLEARAbnormal         Specific Gravity, Urine 1.005 - 1.030 1.017  1.019         pH 5.0 - 8.0 5.0  5.0         Glucose, UA NEGATIVE mg/dL 50Abnormal  NEGATIVE   Negative R   Negative R   Negative R   Hgb urine dipstick NEGATIVE NEGATIVE  NEGATIVE         Bilirubin Urine NEGATIVE NEGATIVE  NEGATIVE         Ketones, ur NEGATIVE mg/dL NEGATIVE  NEGATIVE         Protein, ur NEGATIVE mg/dL 100Abnormal  30Abnormal         Nitrite NEGATIVE NEGATIVE  NEGATIVE         Leukocytes,Ua NEGATIVE NEGATIVE  NEGATIVE         RBC / HPF 0 - 5 RBC/hpf 0-5  0-5         WBC, UA 0 - 5 WBC/hpf 0-5  0-5  0-5 R   None seen R   11-30Abnormal R    Bacteria, UA NONE SEEN RAREAbnormal  NONE SEEN  ManyAbnormal R   None seen R   None seen R    Squamous Epithelial / LPF 0 - 5 0-5  NONE SEEN         Mucus  PRESENT  PRESENT CM           I have reviewed the labs.  Urine Culture: ***  I have reviewed the labs.  Pertinent Imaging: ***  No results found for this or any previous visit.   I have personally reviewed the images and agree with radiologist interpretation.    Assessment & Plan:   ***   Follow Up:  No follow-ups on file.   IArdyth Gal, am acting as a scribe for Dr. John Giovanni.    [place] [place] [address],  [place], [place] [address] [phone]

## 2021-02-22 ENCOUNTER — Ambulatory Visit: Payer: Medicare HMO | Admitting: Urology

## 2021-02-22 ENCOUNTER — Encounter: Payer: Self-pay | Admitting: Urology

## 2021-02-22 DIAGNOSIS — F0390 Unspecified dementia without behavioral disturbance: Secondary | ICD-10-CM | POA: Diagnosis not present

## 2021-02-22 DIAGNOSIS — I10 Essential (primary) hypertension: Secondary | ICD-10-CM | POA: Diagnosis not present

## 2021-02-22 DIAGNOSIS — C61 Malignant neoplasm of prostate: Secondary | ICD-10-CM | POA: Diagnosis not present

## 2021-02-22 DIAGNOSIS — I82402 Acute embolism and thrombosis of unspecified deep veins of left lower extremity: Secondary | ICD-10-CM | POA: Diagnosis not present

## 2021-02-24 DIAGNOSIS — K5900 Constipation, unspecified: Secondary | ICD-10-CM | POA: Diagnosis not present

## 2021-02-24 DIAGNOSIS — I10 Essential (primary) hypertension: Secondary | ICD-10-CM | POA: Diagnosis not present

## 2021-02-24 DIAGNOSIS — E785 Hyperlipidemia, unspecified: Secondary | ICD-10-CM | POA: Diagnosis not present

## 2021-02-26 DIAGNOSIS — R32 Unspecified urinary incontinence: Secondary | ICD-10-CM | POA: Diagnosis not present

## 2021-02-26 DIAGNOSIS — F0390 Unspecified dementia without behavioral disturbance: Secondary | ICD-10-CM | POA: Diagnosis not present

## 2021-02-26 DIAGNOSIS — N5089 Other specified disorders of the male genital organs: Secondary | ICD-10-CM | POA: Diagnosis not present

## 2021-02-26 DIAGNOSIS — I10 Essential (primary) hypertension: Secondary | ICD-10-CM | POA: Diagnosis not present

## 2021-02-26 DIAGNOSIS — C61 Malignant neoplasm of prostate: Secondary | ICD-10-CM | POA: Diagnosis not present

## 2021-02-26 DIAGNOSIS — I82402 Acute embolism and thrombosis of unspecified deep veins of left lower extremity: Secondary | ICD-10-CM | POA: Diagnosis not present

## 2021-03-01 DIAGNOSIS — F0390 Unspecified dementia without behavioral disturbance: Secondary | ICD-10-CM | POA: Diagnosis not present

## 2021-03-01 DIAGNOSIS — I248 Other forms of acute ischemic heart disease: Secondary | ICD-10-CM | POA: Diagnosis not present

## 2021-03-01 DIAGNOSIS — C61 Malignant neoplasm of prostate: Secondary | ICD-10-CM | POA: Diagnosis not present

## 2021-03-01 DIAGNOSIS — I951 Orthostatic hypotension: Secondary | ICD-10-CM | POA: Diagnosis not present

## 2021-03-01 DIAGNOSIS — I82402 Acute embolism and thrombosis of unspecified deep veins of left lower extremity: Secondary | ICD-10-CM | POA: Diagnosis not present

## 2021-03-01 DIAGNOSIS — S0003XD Contusion of scalp, subsequent encounter: Secondary | ICD-10-CM | POA: Diagnosis not present

## 2021-03-01 DIAGNOSIS — I10 Essential (primary) hypertension: Secondary | ICD-10-CM | POA: Diagnosis not present

## 2021-03-01 DIAGNOSIS — N5089 Other specified disorders of the male genital organs: Secondary | ICD-10-CM | POA: Diagnosis not present

## 2021-03-01 DIAGNOSIS — E569 Vitamin deficiency, unspecified: Secondary | ICD-10-CM | POA: Diagnosis not present

## 2021-03-02 ENCOUNTER — Telehealth: Payer: Self-pay

## 2021-03-02 DIAGNOSIS — I951 Orthostatic hypotension: Secondary | ICD-10-CM | POA: Diagnosis not present

## 2021-03-02 DIAGNOSIS — C61 Malignant neoplasm of prostate: Secondary | ICD-10-CM | POA: Diagnosis not present

## 2021-03-02 DIAGNOSIS — N5089 Other specified disorders of the male genital organs: Secondary | ICD-10-CM | POA: Diagnosis not present

## 2021-03-02 DIAGNOSIS — E569 Vitamin deficiency, unspecified: Secondary | ICD-10-CM | POA: Diagnosis not present

## 2021-03-02 DIAGNOSIS — I248 Other forms of acute ischemic heart disease: Secondary | ICD-10-CM | POA: Diagnosis not present

## 2021-03-02 DIAGNOSIS — F0390 Unspecified dementia without behavioral disturbance: Secondary | ICD-10-CM | POA: Diagnosis not present

## 2021-03-02 DIAGNOSIS — S0003XD Contusion of scalp, subsequent encounter: Secondary | ICD-10-CM | POA: Diagnosis not present

## 2021-03-02 DIAGNOSIS — I82402 Acute embolism and thrombosis of unspecified deep veins of left lower extremity: Secondary | ICD-10-CM | POA: Diagnosis not present

## 2021-03-02 DIAGNOSIS — I10 Essential (primary) hypertension: Secondary | ICD-10-CM | POA: Diagnosis not present

## 2021-03-02 NOTE — Telephone Encounter (Signed)
OK for verbal orders?

## 2021-03-02 NOTE — Telephone Encounter (Signed)
Copied from [place] [id number]. Topic: Quick Communication - Home Health Verbal Orders >> Mar 02, 2021 12:46 PM Tessa Lerner A wrote: Caller/Agency: Leory Plowman  Callback Number: [phone]  Requesting OT/PT/Skilled Nursing/Social Work/Speech Therapy: OT  Frequency: 2w3

## 2021-03-02 NOTE — Telephone Encounter (Signed)
Verbal order given  

## 2021-03-04 ENCOUNTER — Other Ambulatory Visit: Payer: Self-pay

## 2021-03-04 ENCOUNTER — Encounter: Payer: Self-pay | Admitting: Family Medicine

## 2021-03-04 ENCOUNTER — Ambulatory Visit (INDEPENDENT_AMBULATORY_CARE_PROVIDER_SITE_OTHER): Payer: Medicare HMO | Admitting: Family Medicine

## 2021-03-04 VITALS — BP 134/86 | HR 91 | Temp 98.2°F | Wt 151.4 lb

## 2021-03-04 DIAGNOSIS — N179 Acute kidney failure, unspecified: Secondary | ICD-10-CM

## 2021-03-04 DIAGNOSIS — I824Y2 Acute embolism and thrombosis of unspecified deep veins of left proximal lower extremity: Secondary | ICD-10-CM | POA: Diagnosis not present

## 2021-03-04 DIAGNOSIS — R339 Retention of urine, unspecified: Secondary | ICD-10-CM

## 2021-03-04 MED ORDER — TAMSULOSIN HCL 0.4 MG PO CAPS: 0.4000 mg | ORAL_CAPSULE | Freq: Every day | ORAL | 1 refills | Status: DC

## 2021-03-04 MED ORDER — AMLODIPINE BESYLATE 5 MG PO TABS: 5.0000 mg | ORAL_TABLET | Freq: Every day | ORAL | 1 refills | Status: DC

## 2021-03-04 NOTE — Progress Notes (Signed)
 BP 134/86   Pulse 91   Temp 98.2 F (36.8 C)   Wt 151 lb 6.4 oz (68.7 kg)   BMI 21.12 kg/m    Subjective:    Patient ID: Ronnie Bullock, male    DOB: 09/06/1946, 75 y.o.   MRN: [id number]  HPI: Ronnie Bullock is a 75 y.o. male  Chief Complaint  Patient presents with  . rehab follow up     Patient was recently discharged from [facility]. Has therapy coming to the house to continue PT.    Transition of Care Hospital Follow up.   Hospital/Facility: [facility] D/C Physician: Dr. Williams D/C Date: 02/16/21 and 02/26/21  Records Requested: 03/04/21 Records Received:  03/04/21 Records Reviewed:  03/04/21  Diagnoses on Discharge: DVT  Date of interactive Contact within 48 hours of discharge:  02/16/21 Contact was through: phone  Date of 7 day or 14 day face-to-face visit:   03/04/21  within 7 days from rehab  Outpatient Encounter Medications as of 03/04/2021  Medication Sig  . acetaminophen (TYLENOL) 325 MG tablet Take 2 tablets (650 mg total) by mouth every 6 (six) hours as needed for fever, headache or mild pain.  . apixaban (ELIQUIS) 5 MG TABS tablet Take 1 tablet (5 mg total) by mouth 2 (two) times daily.  . Multiple Vitamin (MULTIVITAMIN WITH MINERALS) TABS tablet Take 1 tablet by mouth daily.  . polyethylene glycol powder (GLYCOLAX/MIRALAX) 17 GM/SCOOP powder Take 17 g by mouth daily.  . [DISCONTINUED] amLODipine (NORVASC) 5 MG tablet TAKE 1 TABLET (5 MG TOTAL) BY MOUTH DAILY.  . [DISCONTINUED] tamsulosin (FLOMAX) 0.4 MG CAPS capsule TAKE 1 CAPSULE BY MOUTH EVERY DAY  . amLODipine (NORVASC) 5 MG tablet Take 1 tablet (5 mg total) by mouth daily.  . tamsulosin (FLOMAX) 0.4 MG CAPS capsule Take 1 capsule (0.4 mg total) by mouth daily.   No facility-administered encounter medications on file as of 03/04/2021.  Brief/Interim Summary: HPI was taken from Dr. Hall: Ronnie Bullockis a 75 y.o.malewith medical history significant forprostate  cancer,dementia,essential hypertension, hyperlipidemia, who [facility] with complaints of left lower extremity swelling.Patient has dementia and history is mainly obtained from his brother whom he lives with at bedside. Per his brother he has been having complaints of discomfort in his left lower extremity for the past month since he moved in with him and last night he noted significant swelling associated with tenderness to touch. Patient has been living a sedentary lifestyle. No reported fever orcardiopulmonary symptoms. Due to swelling and pain patient was brought into the ED for further evaluation.  Work-up in the ED revealed extensive left lower extremity DVTon venous duplex ultrasound. Vascular surgery was consulted by EDP, possiblethrombectomy on Friday, 01/22/2021. Recommended to start heparin drip. Pharmacy consulted for dosing adjustment.   Diagnostic Tests Reviewed CLINICAL DATA:  Mechanical fall [facility].  Abrasions to head.  EXAM: CT HEAD WITHOUT CONTRAST  TECHNIQUE: Contiguous axial images were obtained from the base of the skull through the vertex without intravenous contrast.  COMPARISON:  Head CT 12/02/2020  FINDINGS: Brain: Stable degree of atrophy and chronic small vessel ischemia. No intracranial hemorrhage, mass effect, or midline shift. No hydrocephalus. The basilar cisterns are patent. Chronic low-density involving left basal ganglia and inferior frontal lobe likely sequela of chronic ischemia, stable from prior. No evidence of territorial infarct or acute ischemia. No extra-axial or intracranial fluid collection.  Vascular: Atherosclerosis of skullbase vasculature without hyperdense vessel or abnormal   calcification.  Skull: No fracture or focal lesion.  Sinuses/Orbits: Left supraorbital scalp hematoma. Chronic opacification of the included paranasal sinuses, stable in appearance from prior. No mastoid effusion.  Other:  Left frontal/supraorbital scalp hematoma.  IMPRESSION: 1. Left frontal/supraorbital scalp hematoma. No acute intracranial abnormality. No skull fracture. 2. Stable atrophy and chronic ischemia. 3. Chronic sinusitis.  CLINICAL DATA:  [facility]  EXAM: CT CERVICAL SPINE WITHOUT CONTRAST  TECHNIQUE: Multidetector CT imaging of the cervical spine was performed without intravenous contrast. Multiplanar CT image reconstructions were also generated.  COMPARISON:  None.  FINDINGS: Alignment: Broad-based reversal of normal lordosis. No traumatic subluxation.  Skull base and vertebrae: No acute fracture. Vertebral body heights are maintained. The dens and skull base are intact.  Soft tissues and spinal canal: No prevertebral fluid or swelling. No visible canal hematoma.  Disc levels: Diffuse degenerative disc disease with disc space narrowing and endplate spurring. No high-grade canal stenosis.  Upper chest: Patulous upper esophagus containing intraluminal fluid is well as retained secretions in the pharynx. No apical pneumothorax or rib fracture.  Other: None.  IMPRESSION: 1. No acute fracture or subluxation of the cervical spine. 2. Broad-based reversal of normal lordosis may be due to positioning or muscle spasm. 3. Diffuse degenerative disc disease. 4. Patulous upper esophagus containing intraluminal fluid is well as retained secretions in the pharynx.  Disposition: To SNF and then home  Consults: None  Discharge Instructions:  Follow up here in 1-2 weeks  Disease/illness Education: discussed today  Home Health/Community Services Discussions/Referrals: up to date  Establishment or re-establishment of referral orders for community resources:  up to date  Discussion with other health care providers: none  Assessment and Support of treatment regimen adherence: good  Appointments Coordinated with: patient and caregiver  Education for  self-management, independent living, and ADLs: Discussed today  Since getting out of the hospital, he has been feeling well. He has been a little bit tired, but is feeling much more like himself. He has been doing PT at home and feels like it has been helping. No other concerns or complaints at this time.   Relevant past medical, surgical, family and social history reviewed and updated as indicated. Interim medical history since our last visit reviewed. Allergies and medications reviewed and updated.  Review of Systems  Constitutional: Negative.   Respiratory: Negative.   Cardiovascular: Negative.   Gastrointestinal: Negative.   Musculoskeletal: Negative.   Neurological: Negative.   Psychiatric/Behavioral: Negative.     Per HPI unless specifically indicated above     Objective:    BP 134/86   Pulse 91   Temp 98.2 F (36.8 C)   Wt 151 lb 6.4 oz (68.7 kg)   BMI 21.12 kg/m   Wt Readings from Last 3 Encounters:  03/04/21 151 lb 6.4 oz (68.7 kg)  02/12/21 150 lb (68 kg)  02/09/21 150 lb 6.4 oz (68.2 kg)    Physical Exam Vitals and nursing note reviewed.  Constitutional:      General: He is not in acute distress.    Appearance: Normal appearance. He is not ill-appearing, toxic-appearing or diaphoretic.  HENT:     Head: Normocephalic and atraumatic.     Right Ear: External ear normal.     Left Ear: External ear normal.     Nose: Nose normal.     Mouth/Throat:     Mouth: Mucous membranes are moist.     Pharynx: Oropharynx is clear.  Eyes:       General: No scleral icterus.       Right eye: No discharge.        Left eye: No discharge.     Extraocular Movements: Extraocular movements intact.     Conjunctiva/sclera: Conjunctivae normal.     Pupils: Pupils are equal, round, and reactive to light.  Cardiovascular:     Rate and Rhythm: Normal rate and regular rhythm.     Pulses: Normal pulses.     Heart sounds: Normal heart sounds. No murmur heard. No friction rub. No  gallop.   Pulmonary:     Effort: Pulmonary effort is normal. No respiratory distress.     Breath sounds: Normal breath sounds. No stridor. No wheezing, rhonchi or rales.  Chest:     Chest wall: No tenderness.  Musculoskeletal:        General: Normal range of motion.     Cervical back: Normal range of motion and neck supple.  Skin:    General: Skin is warm and dry.     Capillary Refill: Capillary refill takes less than 2 seconds.     Coloration: Skin is not jaundiced or pale.     Findings: No bruising, erythema, lesion or rash.  Neurological:     General: No focal deficit present.     Mental Status: He is alert and oriented to person, place, and time. Mental status is at baseline.  Psychiatric:        Mood and Affect: Mood normal.        Behavior: Behavior normal.        Thought Content: Thought content normal.        Judgment: Judgment normal.     Results for orders placed or performed in visit on 16/10/96  Basic metabolic panel  Result Value Ref Range   Glucose 98 65 - 99 mg/dL   BUN 16 8 - 27 mg/dL   Creatinine, Ser 1.19 0.76 - 1.27 mg/dL   eGFR 63 >59 mL/min/1.73   BUN/Creatinine Ratio 13 10 - 24   Sodium 142 134 - 144 mmol/L   Potassium 4.6 3.5 - 5.2 mmol/L   Chloride 103 96 - 106 mmol/L   CO2 25 20 - 29 mmol/L   Calcium 9.7 8.6 - 10.2 mg/dL  CBC with Differential/Platelet  Result Value Ref Range   WBC 5.6 3.4 - 10.8 x10E3/uL   RBC 4.52 4.14 - 5.80 x10E6/uL   Hemoglobin 13.2 13.0 - 17.7 g/dL   Hematocrit 40.0 37.5 - 51.0 %   MCV 89 79 - 97 fL   MCH 29.2 26.6 - 33.0 pg   MCHC 33.0 31.5 - 35.7 g/dL   RDW 14.1 11.6 - 15.4 %   Platelets 289 150 - 450 x10E3/uL   Neutrophils 58 Not Estab. %   Lymphs 20 Not Estab. %   Monocytes 12 Not Estab. %   Eos 9 Not Estab. %   Basos 1 Not Estab. %   Neutrophils Absolute 3.2 1.4 - 7.0 x10E3/uL   Lymphocytes Absolute 1.1 0.7 - 3.1 x10E3/uL   Monocytes Absolute 0.7 0.1 - 0.9 x10E3/uL   EOS (ABSOLUTE) 0.5 (H) 0.0 - 0.4 x10E3/uL    Basophils Absolute 0.0 0.0 - 0.2 x10E3/uL   Immature Granulocytes 0 Not Estab. %   Immature Grans (Abs) 0.0 0.0 - 0.1 x10E3/uL      Assessment & Plan:   Problem List Items Addressed This Visit      Cardiovascular and Mediastinum   DVT (deep venous thrombosis) (HCC) -  Primary    Tolerating his medicine well. Will check labs. Await results. Continue to monitor. Call with any concerns.       Relevant Medications   amLODipine (NORVASC) 5 MG tablet   Other Relevant Orders   Basic metabolic panel (Completed)   CBC with Differential/Platelet (Completed)    Other Visit Diagnoses    Acute renal failure, unspecified acute renal failure type ([place])       Rechecking labs today. Await results. Treat as needed.   Relevant Orders   Basic metabolic panel (Completed)   CBC with Differential/Platelet (Completed)   Incomplete bladder emptying       Doing well on his flomax. Refills given. Call with any concerns.    Relevant Medications   tamsulosin (FLOMAX) 0.4 MG CAPS capsule       Follow up plan: Return in about 3 months (around 06/03/2021).

## 2021-03-05 LAB — BASIC METABOLIC PANEL
BUN/Creatinine Ratio: 13 (ref 10–24)
BUN: 16 mg/dL (ref 8–27)
CO2: 25 mmol/L (ref 20–29)
Calcium: 9.7 mg/dL (ref 8.6–10.2)
Chloride: 103 mmol/L (ref 96–106)
Creatinine, Ser: 1.19 mg/dL (ref 0.76–1.27)
Glucose: 98 mg/dL (ref 65–99)
Potassium: 4.6 mmol/L (ref 3.5–5.2)
Sodium: 142 mmol/L (ref 134–144)
eGFR: 63 mL/min/{1.73_m2} (ref >59)

## 2021-03-05 LAB — CBC WITH DIFFERENTIAL/PLATELET
Basophils Absolute: 0 10*3/uL (ref 0.0–0.2)
Basos: 1 %
EOS (ABSOLUTE): 0.5 10*3/uL — ABNORMAL HIGH (ref 0.0–0.4)
Eos: 9 %
Hematocrit: 40 % (ref 37.5–51.0)
Hemoglobin: 13.2 g/dL (ref 13.0–17.7)
Immature Grans (Abs): 0 10*3/uL (ref 0.0–0.1)
Immature Granulocytes: 0 %
Lymphocytes Absolute: 1.1 10*3/uL (ref 0.7–3.1)
Lymphs: 20 %
MCH: 29.2 pg (ref 26.6–33.0)
MCHC: 33 g/dL (ref 31.5–35.7)
MCV: 89 fL (ref 79–97)
Monocytes Absolute: 0.7 10*3/uL (ref 0.1–0.9)
Monocytes: 12 %
Neutrophils Absolute: 3.2 10*3/uL (ref 1.4–7.0)
Neutrophils: 58 %
Platelets: 289 10*3/uL (ref 150–450)
RBC: 4.52 x10E6/uL (ref 4.14–5.80)
RDW: 14.1 % (ref 11.6–15.4)
WBC: 5.6 10*3/uL (ref 3.4–10.8)

## 2021-03-09 DIAGNOSIS — E569 Vitamin deficiency, unspecified: Secondary | ICD-10-CM | POA: Diagnosis not present

## 2021-03-09 DIAGNOSIS — C61 Malignant neoplasm of prostate: Secondary | ICD-10-CM | POA: Diagnosis not present

## 2021-03-09 DIAGNOSIS — I10 Essential (primary) hypertension: Secondary | ICD-10-CM | POA: Diagnosis not present

## 2021-03-09 DIAGNOSIS — S0003XD Contusion of scalp, subsequent encounter: Secondary | ICD-10-CM | POA: Diagnosis not present

## 2021-03-09 DIAGNOSIS — N5089 Other specified disorders of the male genital organs: Secondary | ICD-10-CM | POA: Diagnosis not present

## 2021-03-09 DIAGNOSIS — I248 Other forms of acute ischemic heart disease: Secondary | ICD-10-CM | POA: Diagnosis not present

## 2021-03-09 DIAGNOSIS — F0390 Unspecified dementia without behavioral disturbance: Secondary | ICD-10-CM | POA: Diagnosis not present

## 2021-03-09 DIAGNOSIS — I82402 Acute embolism and thrombosis of unspecified deep veins of left lower extremity: Secondary | ICD-10-CM | POA: Diagnosis not present

## 2021-03-09 DIAGNOSIS — I951 Orthostatic hypotension: Secondary | ICD-10-CM | POA: Diagnosis not present

## 2021-03-10 ENCOUNTER — Encounter: Payer: Self-pay | Admitting: Family Medicine

## 2021-03-10 NOTE — Assessment & Plan Note (Signed)
Tolerating his medicine well. Will check labs. Await results. Continue to monitor. Call with any concerns.

## 2021-03-11 DIAGNOSIS — S0003XD Contusion of scalp, subsequent encounter: Secondary | ICD-10-CM | POA: Diagnosis not present

## 2021-03-11 DIAGNOSIS — I10 Essential (primary) hypertension: Secondary | ICD-10-CM | POA: Diagnosis not present

## 2021-03-11 DIAGNOSIS — I82402 Acute embolism and thrombosis of unspecified deep veins of left lower extremity: Secondary | ICD-10-CM | POA: Diagnosis not present

## 2021-03-11 DIAGNOSIS — I248 Other forms of acute ischemic heart disease: Secondary | ICD-10-CM | POA: Diagnosis not present

## 2021-03-11 DIAGNOSIS — E569 Vitamin deficiency, unspecified: Secondary | ICD-10-CM | POA: Diagnosis not present

## 2021-03-11 DIAGNOSIS — N5089 Other specified disorders of the male genital organs: Secondary | ICD-10-CM | POA: Diagnosis not present

## 2021-03-11 DIAGNOSIS — F0390 Unspecified dementia without behavioral disturbance: Secondary | ICD-10-CM | POA: Diagnosis not present

## 2021-03-11 DIAGNOSIS — I951 Orthostatic hypotension: Secondary | ICD-10-CM | POA: Diagnosis not present

## 2021-03-11 DIAGNOSIS — C61 Malignant neoplasm of prostate: Secondary | ICD-10-CM | POA: Diagnosis not present

## 2021-03-15 DIAGNOSIS — F0390 Unspecified dementia without behavioral disturbance: Secondary | ICD-10-CM | POA: Diagnosis not present

## 2021-03-15 DIAGNOSIS — N5089 Other specified disorders of the male genital organs: Secondary | ICD-10-CM | POA: Diagnosis not present

## 2021-03-15 DIAGNOSIS — C61 Malignant neoplasm of prostate: Secondary | ICD-10-CM | POA: Diagnosis not present

## 2021-03-15 DIAGNOSIS — I951 Orthostatic hypotension: Secondary | ICD-10-CM | POA: Diagnosis not present

## 2021-03-15 DIAGNOSIS — I10 Essential (primary) hypertension: Secondary | ICD-10-CM | POA: Diagnosis not present

## 2021-03-15 DIAGNOSIS — I248 Other forms of acute ischemic heart disease: Secondary | ICD-10-CM | POA: Diagnosis not present

## 2021-03-15 DIAGNOSIS — S0003XD Contusion of scalp, subsequent encounter: Secondary | ICD-10-CM | POA: Diagnosis not present

## 2021-03-15 DIAGNOSIS — I82402 Acute embolism and thrombosis of unspecified deep veins of left lower extremity: Secondary | ICD-10-CM | POA: Diagnosis not present

## 2021-03-15 DIAGNOSIS — E569 Vitamin deficiency, unspecified: Secondary | ICD-10-CM | POA: Diagnosis not present

## 2021-03-16 DIAGNOSIS — S0003XD Contusion of scalp, subsequent encounter: Secondary | ICD-10-CM | POA: Diagnosis not present

## 2021-03-16 DIAGNOSIS — N5089 Other specified disorders of the male genital organs: Secondary | ICD-10-CM | POA: Diagnosis not present

## 2021-03-16 DIAGNOSIS — E569 Vitamin deficiency, unspecified: Secondary | ICD-10-CM | POA: Diagnosis not present

## 2021-03-16 DIAGNOSIS — C61 Malignant neoplasm of prostate: Secondary | ICD-10-CM | POA: Diagnosis not present

## 2021-03-16 DIAGNOSIS — I951 Orthostatic hypotension: Secondary | ICD-10-CM | POA: Diagnosis not present

## 2021-03-16 DIAGNOSIS — I10 Essential (primary) hypertension: Secondary | ICD-10-CM | POA: Diagnosis not present

## 2021-03-16 DIAGNOSIS — I248 Other forms of acute ischemic heart disease: Secondary | ICD-10-CM | POA: Diagnosis not present

## 2021-03-16 DIAGNOSIS — F0390 Unspecified dementia without behavioral disturbance: Secondary | ICD-10-CM | POA: Diagnosis not present

## 2021-03-16 DIAGNOSIS — I82402 Acute embolism and thrombosis of unspecified deep veins of left lower extremity: Secondary | ICD-10-CM | POA: Diagnosis not present

## 2021-03-17 DIAGNOSIS — I82402 Acute embolism and thrombosis of unspecified deep veins of left lower extremity: Secondary | ICD-10-CM | POA: Diagnosis not present

## 2021-03-17 DIAGNOSIS — C61 Malignant neoplasm of prostate: Secondary | ICD-10-CM | POA: Diagnosis not present

## 2021-03-17 DIAGNOSIS — N5089 Other specified disorders of the male genital organs: Secondary | ICD-10-CM | POA: Diagnosis not present

## 2021-03-17 DIAGNOSIS — I951 Orthostatic hypotension: Secondary | ICD-10-CM | POA: Diagnosis not present

## 2021-03-17 DIAGNOSIS — I248 Other forms of acute ischemic heart disease: Secondary | ICD-10-CM | POA: Diagnosis not present

## 2021-03-17 DIAGNOSIS — I10 Essential (primary) hypertension: Secondary | ICD-10-CM | POA: Diagnosis not present

## 2021-03-17 DIAGNOSIS — E569 Vitamin deficiency, unspecified: Secondary | ICD-10-CM | POA: Diagnosis not present

## 2021-03-17 DIAGNOSIS — F0390 Unspecified dementia without behavioral disturbance: Secondary | ICD-10-CM | POA: Diagnosis not present

## 2021-03-17 DIAGNOSIS — S0003XD Contusion of scalp, subsequent encounter: Secondary | ICD-10-CM | POA: Diagnosis not present

## 2021-03-18 DIAGNOSIS — I82402 Acute embolism and thrombosis of unspecified deep veins of left lower extremity: Secondary | ICD-10-CM | POA: Diagnosis not present

## 2021-03-18 DIAGNOSIS — C61 Malignant neoplasm of prostate: Secondary | ICD-10-CM | POA: Diagnosis not present

## 2021-03-18 DIAGNOSIS — N5089 Other specified disorders of the male genital organs: Secondary | ICD-10-CM | POA: Diagnosis not present

## 2021-03-18 DIAGNOSIS — I951 Orthostatic hypotension: Secondary | ICD-10-CM | POA: Diagnosis not present

## 2021-03-18 DIAGNOSIS — F0390 Unspecified dementia without behavioral disturbance: Secondary | ICD-10-CM | POA: Diagnosis not present

## 2021-03-18 DIAGNOSIS — I248 Other forms of acute ischemic heart disease: Secondary | ICD-10-CM | POA: Diagnosis not present

## 2021-03-18 DIAGNOSIS — I10 Essential (primary) hypertension: Secondary | ICD-10-CM | POA: Diagnosis not present

## 2021-03-18 DIAGNOSIS — S0003XD Contusion of scalp, subsequent encounter: Secondary | ICD-10-CM | POA: Diagnosis not present

## 2021-03-18 DIAGNOSIS — E569 Vitamin deficiency, unspecified: Secondary | ICD-10-CM | POA: Diagnosis not present

## 2021-03-19 ENCOUNTER — Encounter: Payer: Medicare HMO | Admitting: Family Medicine

## 2021-03-22 ENCOUNTER — Encounter: Payer: Medicare HMO | Admitting: Family Medicine

## 2021-03-23 DIAGNOSIS — I951 Orthostatic hypotension: Secondary | ICD-10-CM | POA: Diagnosis not present

## 2021-03-23 DIAGNOSIS — I10 Essential (primary) hypertension: Secondary | ICD-10-CM | POA: Diagnosis not present

## 2021-03-23 DIAGNOSIS — E569 Vitamin deficiency, unspecified: Secondary | ICD-10-CM | POA: Diagnosis not present

## 2021-03-23 DIAGNOSIS — I248 Other forms of acute ischemic heart disease: Secondary | ICD-10-CM | POA: Diagnosis not present

## 2021-03-23 DIAGNOSIS — F0390 Unspecified dementia without behavioral disturbance: Secondary | ICD-10-CM | POA: Diagnosis not present

## 2021-03-23 DIAGNOSIS — N5089 Other specified disorders of the male genital organs: Secondary | ICD-10-CM | POA: Diagnosis not present

## 2021-03-23 DIAGNOSIS — I82402 Acute embolism and thrombosis of unspecified deep veins of left lower extremity: Secondary | ICD-10-CM | POA: Diagnosis not present

## 2021-03-23 DIAGNOSIS — S0003XD Contusion of scalp, subsequent encounter: Secondary | ICD-10-CM | POA: Diagnosis not present

## 2021-03-23 DIAGNOSIS — C61 Malignant neoplasm of prostate: Secondary | ICD-10-CM | POA: Diagnosis not present

## 2021-03-31 DIAGNOSIS — S0003XD Contusion of scalp, subsequent encounter: Secondary | ICD-10-CM | POA: Diagnosis not present

## 2021-03-31 DIAGNOSIS — I951 Orthostatic hypotension: Secondary | ICD-10-CM | POA: Diagnosis not present

## 2021-03-31 DIAGNOSIS — I10 Essential (primary) hypertension: Secondary | ICD-10-CM | POA: Diagnosis not present

## 2021-03-31 DIAGNOSIS — N5089 Other specified disorders of the male genital organs: Secondary | ICD-10-CM | POA: Diagnosis not present

## 2021-03-31 DIAGNOSIS — I82402 Acute embolism and thrombosis of unspecified deep veins of left lower extremity: Secondary | ICD-10-CM | POA: Diagnosis not present

## 2021-03-31 DIAGNOSIS — F0390 Unspecified dementia without behavioral disturbance: Secondary | ICD-10-CM | POA: Diagnosis not present

## 2021-03-31 DIAGNOSIS — I248 Other forms of acute ischemic heart disease: Secondary | ICD-10-CM | POA: Diagnosis not present

## 2021-03-31 DIAGNOSIS — C61 Malignant neoplasm of prostate: Secondary | ICD-10-CM | POA: Diagnosis not present

## 2021-03-31 DIAGNOSIS — E569 Vitamin deficiency, unspecified: Secondary | ICD-10-CM | POA: Diagnosis not present

## 2021-04-02 ENCOUNTER — Encounter: Payer: Self-pay | Admitting: Family Medicine

## 2021-04-02 ENCOUNTER — Other Ambulatory Visit: Payer: Self-pay

## 2021-04-02 ENCOUNTER — Ambulatory Visit (INDEPENDENT_AMBULATORY_CARE_PROVIDER_SITE_OTHER): Payer: Medicare HMO | Admitting: Family Medicine

## 2021-04-02 VITALS — BP 129/73 | HR 92 | Temp 97.9°F | Ht 69.25 in | Wt 160.0 lb

## 2021-04-02 DIAGNOSIS — I824Y2 Acute embolism and thrombosis of unspecified deep veins of left proximal lower extremity: Secondary | ICD-10-CM | POA: Diagnosis not present

## 2021-04-02 DIAGNOSIS — I10 Essential (primary) hypertension: Secondary | ICD-10-CM

## 2021-04-02 DIAGNOSIS — E7800 Pure hypercholesterolemia, unspecified: Secondary | ICD-10-CM

## 2021-04-02 DIAGNOSIS — C61 Malignant neoplasm of prostate: Secondary | ICD-10-CM

## 2021-04-02 DIAGNOSIS — Z0000 Encounter for general adult medical examination without abnormal findings: Secondary | ICD-10-CM

## 2021-04-02 DIAGNOSIS — N179 Acute kidney failure, unspecified: Secondary | ICD-10-CM | POA: Diagnosis not present

## 2021-04-02 MED ORDER — LEVOCETIRIZINE DIHYDROCHLORIDE 5 MG PO TABS: 5.0000 mg | ORAL_TABLET | Freq: Every evening | ORAL | 1 refills | Status: DC

## 2021-04-02 NOTE — Assessment & Plan Note (Signed)
Tolerating xarelto well. Follow up with vascular as scheduled. Continue to monitor.

## 2021-04-02 NOTE — Assessment & Plan Note (Signed)
Under good control on current regimen. Continue current regimen. Continue to monitor. Call with any concerns. Refills given. Labs drawn today.   

## 2021-04-02 NOTE — Progress Notes (Signed)
BP 129/73   Pulse 92   Temp 97.9 F (36.6 C)   Ht 5' 9.25" (1.759 m)   Wt 160 lb (72.6 kg)   SpO2 95%   BMI 23.46 kg/m    Subjective:    Patient ID: Ronnie Bullock, male    DOB: 1946-01-05, 75 y.o.   MRN: [id number]  HPI: Ronnie Bullock is a 75 y.o. male presenting on 04/02/2021 for comprehensive medical examination. Current medical complaints include:  HYPERTENSION / HYPERLIPIDEMIA Satisfied with current treatment? yes Duration of hypertension: chronic BP monitoring frequency: not checking BP medication side effects: no Past BP meds: amlodipine Duration of hyperlipidemia: chronic Cholesterol medication side effects: not on anything Cholesterol supplements: none Past cholesterol medications: none Medication compliance: excellent compliance Aspirin: no Recent stressors: no Recurrent headaches: no Visual changes: no Palpitations: no Dyspnea: no Chest pain: no Lower extremity edema: no Dizzy/lightheaded: no  BPH BPH status: controlled Satisfied with current treatment?: yes Medication side effects: no Medication compliance: excellent compliance Duration: chronic Nocturia: 1/night Urinary frequency:no Incomplete voiding: no Urgency: no Weak urinary stream: no Straining to start stream: no Dysuria: no Onset: gradual Severity: moderate  Interim Problems from his last visit: no  Depression Screen done today and results listed below:  Depression screen [facility] 2/9 04/02/2021 02/09/2021 09/10/2020 01/23/2020 12/12/2019  Decreased Interest 0 0 0 0 0  Down, Depressed, Hopeless 0 0 0 0 0  PHQ - 2 Score 0 0 0 0 0  Altered sleeping - - 0 - -  Tired, decreased energy - - 0 - -  Change in appetite - - 0 - -  Feeling bad or failure about yourself  - - 0 - -  Trouble concentrating - - 0 - -  Moving slowly or fidgety/restless - - 0 - -  Suicidal thoughts - - 0 - -  PHQ-9 Score - - 0 - -  Difficult doing work/chores - - Not difficult at all - -    Past Medical History:   Past Medical History:  Diagnosis Date  . Dementia ([place])   . Dementia ([place])   . Hyperlipidemia   . Hypertension   . Incontinence     Surgical History:  Past Surgical History:  Procedure Laterality Date  . COLONOSCOPY WITH PROPOFOL N/A 05/16/2018   Procedure: COLONOSCOPY WITH PROPOFOL;  Surgeon: Virgel Manifold, MD;  Location: [facility] ENDOSCOPY;  Service: Endoscopy;  Laterality: N/A;  . PERIPHERAL VASCULAR THROMBECTOMY Left 01/22/2021   Procedure: PERIPHERAL VASCULAR THROMBECTOMY / THROMBOLYSIS;  Surgeon: Katha Cabal, MD;  Location: [place] [facility];  Service: Cardiovascular;  Laterality: Left;    Medications:  Current Outpatient Medications on File Prior to Visit  Medication Sig  . acetaminophen (TYLENOL) 325 MG tablet Take 2 tablets (650 mg total) by mouth every 6 (six) hours as needed for fever, headache or mild pain.  Marland Kitchen amLODipine (NORVASC) 5 MG tablet Take 1 tablet (5 mg total) by mouth daily.  Marland Kitchen apixaban (ELIQUIS) 5 MG TABS tablet Take 1 tablet (5 mg total) by mouth 2 (two) times daily.  . Multiple Vitamin (MULTIVITAMIN WITH MINERALS) TABS tablet Take 1 tablet by mouth daily.  . polyethylene glycol powder (GLYCOLAX/MIRALAX) 17 GM/SCOOP powder Take 17 g by mouth daily.  . tamsulosin (FLOMAX) 0.4 MG CAPS capsule Take 1 capsule (0.4 mg total) by mouth daily.   No current facility-administered medications on file prior to visit.    Allergies:  No Known Allergies  Social History:  Social History  Socioeconomic History  . Marital status: Widowed    Spouse name: Not on file  . Number of children: Not on file  . Years of education: Not on file  . Highest education level: 12th grade  Occupational History  . Not on file  Tobacco Use  . Smoking status: Never Smoker  . Smokeless tobacco: Never Used  Vaping Use  . Vaping Use: Never used  Substance and Sexual Activity  . Alcohol use: No    Alcohol/week: 0.0 standard drinks  . Drug use: No  . Sexual activity:  Yes  Other Topics Concern  . Not on file  Social History Narrative  . Not on file   Social Determinants of Health   Financial Resource Strain: Not on file  Food Insecurity: Not on file  Transportation Needs: Not on file  Physical Activity: Not on file  Stress: Not on file  Social Connections: Not on file  Intimate Partner Violence: Not on file   Social History   Tobacco Use  Smoking Status Never Smoker  Smokeless Tobacco Never Used   Social History   Substance and Sexual Activity  Alcohol Use No  . Alcohol/week: 0.0 standard drinks    Family History:  Family History  Problem Relation Age of Onset  . Hypertension Mother   . Prostate cancer Neg Hx   . Bladder Cancer Neg Hx   . Kidney cancer Neg Hx     Past medical history, surgical history, medications, allergies, family history and social history reviewed with patient today and changes made to appropriate areas of the chart.   Review of Systems  Constitutional: Negative.   HENT: Negative.   Eyes: Negative.   Respiratory: Positive for cough. Negative for hemoptysis, sputum production, shortness of breath and wheezing.   Cardiovascular: Negative.   Gastrointestinal: Negative.   Genitourinary: Negative.   Musculoskeletal: Negative.   Skin: Negative.   Neurological: Negative.   Endo/Heme/Allergies: Positive for environmental allergies. Negative for polydipsia. Does not bruise/bleed easily.  Psychiatric/Behavioral: Negative.    All other ROS negative except what is listed above and in the HPI.      Objective:    BP 129/73   Pulse 92   Temp 97.9 F (36.6 C)   Ht 5' 9.25" (1.759 m)   Wt 160 lb (72.6 kg)   SpO2 95%   BMI 23.46 kg/m   Wt Readings from Last 3 Encounters:  04/02/21 160 lb (72.6 kg)  03/04/21 151 lb 6.4 oz (68.7 kg)  02/12/21 150 lb (68 kg)    Physical Exam Vitals and nursing note reviewed.  Constitutional:      General: He is not in acute distress.    Appearance: Normal appearance. He  is normal weight. He is not ill-appearing, toxic-appearing or diaphoretic.  HENT:     Head: Normocephalic and atraumatic.     Right Ear: Tympanic membrane, ear canal and external ear normal. There is no impacted cerumen.     Left Ear: Tympanic membrane, ear canal and external ear normal. There is no impacted cerumen.     Nose: Nose normal. No congestion or rhinorrhea.     Mouth/Throat:     Mouth: Mucous membranes are moist.     Pharynx: Oropharynx is clear. No oropharyngeal exudate or posterior oropharyngeal erythema.  Eyes:     General: No scleral icterus.       Right eye: No discharge.        Left eye: No discharge.     Extraocular  Movements: Extraocular movements intact.     Conjunctiva/sclera: Conjunctivae normal.     Pupils: Pupils are equal, round, and reactive to light.  Neck:     Vascular: No carotid bruit.  Cardiovascular:     Rate and Rhythm: Normal rate and regular rhythm.     Pulses: Normal pulses.     Heart sounds: No murmur heard. No friction rub. No gallop.   Pulmonary:     Effort: Pulmonary effort is normal. No respiratory distress.     Breath sounds: Normal breath sounds. No stridor. No wheezing, rhonchi or rales.  Chest:     Chest wall: No tenderness.  Abdominal:     General: Abdomen is flat. Bowel sounds are normal. There is no distension.     Palpations: Abdomen is soft. There is no mass.     Tenderness: There is no abdominal tenderness. There is no right CVA tenderness, left CVA tenderness, guarding or rebound.     Hernia: No hernia is present.  Genitourinary:    Comments: Genital exam deferred with shared decision making Musculoskeletal:        General: No swelling, tenderness, deformity or signs of injury.     Cervical back: Normal range of motion and neck supple. No rigidity. No muscular tenderness.     Right lower leg: No edema.     Left lower leg: No edema.  Lymphadenopathy:     Cervical: No cervical adenopathy.  Skin:    General: Skin is warm and  dry.     Capillary Refill: Capillary refill takes less than 2 seconds.     Coloration: Skin is not jaundiced or pale.     Findings: No bruising, erythema, lesion or rash.  Neurological:     General: No focal deficit present.     Mental Status: He is alert and oriented to person, place, and time.     Cranial Nerves: No cranial nerve deficit.     Sensory: No sensory deficit.     Motor: No weakness.     Coordination: Coordination normal.     Gait: Gait normal.     Deep Tendon Reflexes: Reflexes normal.  Psychiatric:        Mood and Affect: Mood normal.        Behavior: Behavior normal.        Thought Content: Thought content normal.        Judgment: Judgment normal.     Results for orders placed or performed in visit on 36/46/80  Basic metabolic panel  Result Value Ref Range   Glucose 98 65 - 99 mg/dL   BUN 16 8 - 27 mg/dL   Creatinine, Ser 1.19 0.76 - 1.27 mg/dL   eGFR 63 >59 mL/min/1.73   BUN/Creatinine Ratio 13 10 - 24   Sodium 142 134 - 144 mmol/L   Potassium 4.6 3.5 - 5.2 mmol/L   Chloride 103 96 - 106 mmol/L   CO2 25 20 - 29 mmol/L   Calcium 9.7 8.6 - 10.2 mg/dL  CBC with Differential/Platelet  Result Value Ref Range   WBC 5.6 3.4 - 10.8 x10E3/uL   RBC 4.52 4.14 - 5.80 x10E6/uL   Hemoglobin 13.2 13.0 - 17.7 g/dL   Hematocrit 40.0 37.5 - 51.0 %   MCV 89 79 - 97 fL   MCH 29.2 26.6 - 33.0 pg   MCHC 33.0 31.5 - 35.7 g/dL   RDW 14.1 11.6 - 15.4 %   Platelets 289 150 - 450 x10E3/uL  Neutrophils 58 Not Estab. %   Lymphs 20 Not Estab. %   Monocytes 12 Not Estab. %   Eos 9 Not Estab. %   Basos 1 Not Estab. %   Neutrophils Absolute 3.2 1.4 - 7.0 x10E3/uL   Lymphocytes Absolute 1.1 0.7 - 3.1 x10E3/uL   Monocytes Absolute 0.7 0.1 - 0.9 x10E3/uL   EOS (ABSOLUTE) 0.5 (H) 0.0 - 0.4 x10E3/uL   Basophils Absolute 0.0 0.0 - 0.2 x10E3/uL   Immature Granulocytes 0 Not Estab. %   Immature Grans (Abs) 0.0 0.0 - 0.1 x10E3/uL      Assessment & Plan:   Problem List Items  Addressed This Visit      Cardiovascular and Mediastinum   Essential hypertension, benign    Under good control on current regimen. Continue current regimen. Continue to monitor. Call with any concerns. Refills given. Labs drawn today.        Relevant Orders   Comprehensive metabolic panel   CBC with Differential/Platelet   TSH   Urinalysis, Routine w reflex microscopic   Microalbumin, Urine Waived   DVT (deep venous thrombosis) ([place])    Tolerating xarelto well. Follow up with vascular as scheduled. Continue to monitor.       Relevant Orders   Comprehensive metabolic panel   CBC with Differential/Platelet     Genitourinary   Prostate cancer ([place])    Continue to follow with oncology. Continue to monitor. Call with any concerns.       Relevant Orders   Comprehensive metabolic panel   CBC with Differential/Platelet   PSA   Urinalysis, Routine w reflex microscopic     Other   Hypercholesteremia    Under good control on current regimen. Continue current regimen. Continue to monitor. Call with any concerns. Refills given. Labs drawn today.       Relevant Orders   Comprehensive metabolic panel   CBC with Differential/Platelet   Lipid Panel w/o Chol/HDL Ratio    Other Visit Diagnoses    Routine general medical examination at a health care facility    -  Primary   Vaccines up to date/declined. Screening labs checked today. Colonoscopy up to date. Continue diet and exercise. Call with any concerns.       Discussed aspirin prophylaxis for myocardial infarction prevention and decision was it was not indicated  LABORATORY TESTING:  Health maintenance labs ordered today as discussed above.   The natural history of prostate cancer and ongoing controversy regarding screening and potential treatment outcomes of prostate cancer has been discussed with the patient. The meaning of a false positive PSA and a false negative PSA has been discussed. He indicates understanding of the  limitations of this screening test and wishes to proceed with screening PSA testing.   IMMUNIZATIONS:   - Tdap: Tetanus vaccination status reviewed: last tetanus booster within 10 years. - Influenza: Postponed to flu season - Pneumovax: Up to date - Prevnar: Up to date - COVID: Refused - Shingrix vaccine: Refused  SCREENING: - Colonoscopy: Up to date  Discussed with patient purpose of the colonoscopy is to detect colon cancer at curable precancerous or early stages   PATIENT COUNSELING:    Sexuality: Discussed sexually transmitted diseases, partner selection, use of condoms, avoidance of unintended pregnancy  and contraceptive alternatives.   Advised to avoid cigarette smoking.  I discussed with the patient that most people either abstain from alcohol or drink within safe limits (<=14/week and <=4 drinks/occasion for males, <=7/weeks and <= 3 drinks/occasion  for females) and that the risk for alcohol disorders and other health effects rises proportionally with the number of drinks per week and how often a drinker exceeds daily limits.  Discussed cessation/primary prevention of drug use and availability of treatment for abuse.   Diet: Encouraged to adjust caloric intake to maintain  or achieve ideal body weight, to reduce intake of dietary saturated fat and total fat, to limit sodium intake by avoiding high sodium foods and not adding table salt, and to maintain adequate dietary potassium and calcium preferably from fresh fruits, vegetables, and low-fat dairy products.    stressed the importance of regular exercise  Injury prevention: Discussed safety belts, safety helmets, smoke detector, smoking near bedding or upholstery.   Dental health: Discussed importance of regular tooth brushing, flossing, and dental visits.   Follow up plan: NEXT PREVENTATIVE PHYSICAL DUE IN 1 YEAR. Return in about 5 months (around 09/02/2021).

## 2021-04-02 NOTE — Assessment & Plan Note (Signed)
Continue to follow with oncology. Continue to monitor. Call with any concerns.  

## 2021-04-03 LAB — COMPREHENSIVE METABOLIC PANEL
ALT: 8 IU/L (ref 0–44)
AST: 20 IU/L (ref 0–40)
Albumin/Globulin Ratio: 1 — ABNORMAL LOW (ref 1.2–2.2)
Albumin: 3.5 g/dL — ABNORMAL LOW (ref 3.7–4.7)
Alkaline Phosphatase: 91 IU/L (ref 44–121)
BUN/Creatinine Ratio: 17 (ref 10–24)
BUN: 21 mg/dL (ref 8–27)
Bilirubin Total: <0.2 mg/dL (ref 0.0–1.2)
CO2: 22 mmol/L (ref 20–29)
Calcium: 9.2 mg/dL (ref 8.6–10.2)
Chloride: 105 mmol/L (ref 96–106)
Creatinine, Ser: 1.24 mg/dL (ref 0.76–1.27)
Globulin, Total: 3.6 g/dL (ref 1.5–4.5)
Glucose: 103 mg/dL — ABNORMAL HIGH (ref 65–99)
Potassium: 4.4 mmol/L (ref 3.5–5.2)
Sodium: 141 mmol/L (ref 134–144)
Total Protein: 7.1 g/dL (ref 6.0–8.5)
eGFR: 60 mL/min/{1.73_m2} (ref >59)

## 2021-04-03 LAB — CBC WITH DIFFERENTIAL/PLATELET
Basophils Absolute: 0 10*3/uL (ref 0.0–0.2)
Basos: 1 %
EOS (ABSOLUTE): 0.6 10*3/uL — ABNORMAL HIGH (ref 0.0–0.4)
Eos: 11 %
Hematocrit: 40.9 % (ref 37.5–51.0)
Hemoglobin: 13.4 g/dL (ref 13.0–17.7)
Immature Grans (Abs): 0 10*3/uL (ref 0.0–0.1)
Immature Granulocytes: 0 %
Lymphocytes Absolute: 1.1 10*3/uL (ref 0.7–3.1)
Lymphs: 21 %
MCH: 30.1 pg (ref 26.6–33.0)
MCHC: 32.8 g/dL (ref 31.5–35.7)
MCV: 92 fL (ref 79–97)
Monocytes Absolute: 0.8 10*3/uL (ref 0.1–0.9)
Monocytes: 16 %
Neutrophils Absolute: 2.7 10*3/uL (ref 1.4–7.0)
Neutrophils: 51 %
Platelets: 254 10*3/uL (ref 150–450)
RBC: 4.45 x10E6/uL (ref 4.14–5.80)
RDW: 14.4 % (ref 11.6–15.4)
WBC: 5.3 10*3/uL (ref 3.4–10.8)

## 2021-04-03 LAB — PSA: Prostate Specific Ag, Serum: 0.2 ng/mL (ref 0.0–4.0)

## 2021-04-03 LAB — LIPID PANEL W/O CHOL/HDL RATIO
Cholesterol, Total: 154 mg/dL (ref 100–199)
HDL: 56 mg/dL (ref >39)
LDL Chol Calc (NIH): 77 mg/dL (ref 0–99)
Triglycerides: 118 mg/dL (ref 0–149)
VLDL Cholesterol Cal: 21 mg/dL (ref 5–40)

## 2021-04-03 LAB — TSH: TSH: 0.917 u[IU]/mL (ref 0.450–4.500)

## 2021-04-05 DIAGNOSIS — F0390 Unspecified dementia without behavioral disturbance: Secondary | ICD-10-CM | POA: Diagnosis not present

## 2021-04-05 DIAGNOSIS — I82402 Acute embolism and thrombosis of unspecified deep veins of left lower extremity: Secondary | ICD-10-CM | POA: Diagnosis not present

## 2021-04-05 DIAGNOSIS — C61 Malignant neoplasm of prostate: Secondary | ICD-10-CM | POA: Diagnosis not present

## 2021-04-05 DIAGNOSIS — N5089 Other specified disorders of the male genital organs: Secondary | ICD-10-CM | POA: Diagnosis not present

## 2021-04-05 DIAGNOSIS — I248 Other forms of acute ischemic heart disease: Secondary | ICD-10-CM | POA: Diagnosis not present

## 2021-04-05 DIAGNOSIS — I951 Orthostatic hypotension: Secondary | ICD-10-CM | POA: Diagnosis not present

## 2021-04-05 DIAGNOSIS — S0003XD Contusion of scalp, subsequent encounter: Secondary | ICD-10-CM | POA: Diagnosis not present

## 2021-04-05 DIAGNOSIS — E569 Vitamin deficiency, unspecified: Secondary | ICD-10-CM | POA: Diagnosis not present

## 2021-04-05 DIAGNOSIS — I10 Essential (primary) hypertension: Secondary | ICD-10-CM | POA: Diagnosis not present

## 2021-04-12 DIAGNOSIS — I10 Essential (primary) hypertension: Secondary | ICD-10-CM | POA: Diagnosis not present

## 2021-04-12 DIAGNOSIS — I951 Orthostatic hypotension: Secondary | ICD-10-CM | POA: Diagnosis not present

## 2021-04-12 DIAGNOSIS — E569 Vitamin deficiency, unspecified: Secondary | ICD-10-CM | POA: Diagnosis not present

## 2021-04-12 DIAGNOSIS — I82402 Acute embolism and thrombosis of unspecified deep veins of left lower extremity: Secondary | ICD-10-CM | POA: Diagnosis not present

## 2021-04-12 DIAGNOSIS — N5089 Other specified disorders of the male genital organs: Secondary | ICD-10-CM | POA: Diagnosis not present

## 2021-04-12 DIAGNOSIS — I248 Other forms of acute ischemic heart disease: Secondary | ICD-10-CM | POA: Diagnosis not present

## 2021-04-12 DIAGNOSIS — S0003XD Contusion of scalp, subsequent encounter: Secondary | ICD-10-CM | POA: Diagnosis not present

## 2021-04-12 DIAGNOSIS — F0390 Unspecified dementia without behavioral disturbance: Secondary | ICD-10-CM | POA: Diagnosis not present

## 2021-04-12 DIAGNOSIS — C61 Malignant neoplasm of prostate: Secondary | ICD-10-CM | POA: Diagnosis not present

## 2021-04-14 ENCOUNTER — Other Ambulatory Visit: Payer: Self-pay

## 2021-04-14 ENCOUNTER — Ambulatory Visit (INDEPENDENT_AMBULATORY_CARE_PROVIDER_SITE_OTHER): Payer: Medicare HMO | Admitting: Urology

## 2021-04-14 ENCOUNTER — Encounter: Payer: Self-pay | Admitting: Urology

## 2021-04-14 VITALS — BP 127/84 | HR 106 | Ht 69.0 in | Wt 158.0 lb

## 2021-04-14 DIAGNOSIS — N5089 Other specified disorders of the male genital organs: Secondary | ICD-10-CM | POA: Diagnosis not present

## 2021-04-14 LAB — URINALYSIS, COMPLETE
Bilirubin, UA: NEGATIVE
Glucose, UA: NEGATIVE
Ketones, UA: NEGATIVE
Leukocytes,UA: NEGATIVE
Nitrite, UA: NEGATIVE
RBC, UA: NEGATIVE
Specific Gravity, UA: 1.02 (ref 1.005–1.030)
Urobilinogen, Ur: 0.2 mg/dL (ref 0.2–1.0)
pH, UA: 5.5 (ref 5.0–7.5)

## 2021-04-14 LAB — MICROSCOPIC EXAMINATION
Bacteria, UA: NONE SEEN
Epithelial Cells (non renal): NONE SEEN /hpf (ref 0–10)

## 2021-04-15 ENCOUNTER — Encounter: Payer: Self-pay | Admitting: *Deleted

## 2021-04-15 ENCOUNTER — Encounter: Payer: Self-pay | Admitting: Urology

## 2021-04-15 LAB — BUN+CREAT
BUN/Creatinine Ratio: 16 (ref 10–24)
BUN: 22 mg/dL (ref 8–27)
Creatinine, Ser: 1.39 mg/dL — ABNORMAL HIGH (ref 0.76–1.27)
eGFR: 53 mL/min/{1.73_m2} — ABNORMAL LOW (ref >59)

## 2021-04-15 NOTE — Progress Notes (Signed)
04/14/2021 7:09 AM   Ronnie Bullock [birth year] [id number]  Referring provider: [name], DO [place],  [place] [address]  Chief Complaint  Patient presents with   Other     Urologic history: 1.  Intermediate risk (unfavorable) prostate cancer -Diagnosed 06/2019; prostate volume 63 g; PSA 5.7 -IMRT completed 10/02/2019  HPI: 75 y.o. male presents for evaluation of scrotal swelling.  Approximately 1 month history bilateral scrotal swelling No pain or discomfort Denies fever, chills  PMH: Past Medical History:  Diagnosis Date   Dementia ([place])    Dementia ([place])    Hyperlipidemia    Hypertension    Incontinence     Surgical History: Past Surgical History:  Procedure Laterality Date   COLONOSCOPY WITH PROPOFOL N/A 05/16/2018   Procedure: COLONOSCOPY WITH PROPOFOL;  Surgeon: Virgel Manifold, MD;  Location: [facility] ENDOSCOPY;  Service: Endoscopy;  Laterality: N/A;   PERIPHERAL VASCULAR THROMBECTOMY Left 01/22/2021   Procedure: PERIPHERAL VASCULAR THROMBECTOMY / THROMBOLYSIS;  Surgeon: Katha Cabal, MD;  Location: [place] [facility];  Service: Cardiovascular;  Laterality: Left;    Home Medications:  Allergies as of 04/14/2021   No Known Allergies      Medication List        Accurate as of April 14, 2021 11:59 PM. If you have any questions, ask your nurse or doctor.          acetaminophen 325 MG tablet Commonly known as: TYLENOL Take 2 tablets (650 mg total) by mouth every 6 (six) hours as needed for fever, headache or mild pain.   amLODipine 5 MG tablet Commonly known as: NORVASC Take 1 tablet (5 mg total) by mouth daily.   apixaban 5 MG Tabs tablet Commonly known as: ELIQUIS Take 1 tablet (5 mg total) by mouth 2 (two) times daily.   levocetirizine 5 MG tablet Commonly known as: Xyzal Allergy 24HR Take 1 tablet (5 mg total) by mouth every evening.   multivitamin with minerals Tabs tablet Take 1 tablet by mouth daily.    polyethylene glycol powder 17 GM/SCOOP powder Commonly known as: GLYCOLAX/MIRALAX Take 17 g by mouth daily.   tamsulosin 0.4 MG Caps capsule Commonly known as: FLOMAX Take 1 capsule (0.4 mg total) by mouth daily.        Allergies: No Known Allergies  Family History: Family History  Problem Relation Age of Onset   Hypertension Mother    Prostate cancer Neg Hx    Bladder Cancer Neg Hx    Kidney cancer Neg Hx     Social History:  reports that he has never smoked. He has never used smokeless tobacco. He reports that he does not drink alcohol and does not use drugs.   Physical Exam: BP 127/84   Pulse (!) 106   Ht 5\' 9"  (1.753 m)   Wt 158 lb (71.7 kg)   BMI 23.33 kg/m   Constitutional:  Alert and oriented, No acute distress. HEENT: [place] AT, moist mucus membranes.  Trachea midline, no masses. Cardiovascular: No clubbing, cyanosis, or edema. Respiratory: Normal respiratory effort, no increased work of breathing. GI: Abdomen is soft, nontender, nondistended, no abdominal masses GU: Phallus without lesions, testes descended bilaterally without masses or tenderness.  Mild scrotal wall edema bilaterally no scrotal erythema or tenderness Skin: No rashes, bruises or suspicious lesions.  + Pedal edema Neurologic: Grossly intact, no focal deficits, moving all 4 extremities. Psychiatric: Normal mood and affect.   Assessment & Plan:  1.  Scrotal edema Urinalysis today unremarkable No evidence of scrotal cellulitis Check serum creatinine Recommend scrotal elevation when supine and scrotal support when upright   Abbie Sons, MD  Ronnie Bullock [address], [place] Ronnie Bullock [address] [phone]

## 2021-04-23 ENCOUNTER — Ambulatory Visit (INDEPENDENT_AMBULATORY_CARE_PROVIDER_SITE_OTHER): Payer: Medicare HMO | Admitting: Family Medicine

## 2021-04-23 ENCOUNTER — Other Ambulatory Visit: Payer: Self-pay

## 2021-04-23 ENCOUNTER — Encounter: Payer: Self-pay | Admitting: Family Medicine

## 2021-04-23 ENCOUNTER — Telehealth: Payer: Self-pay

## 2021-04-23 DIAGNOSIS — J301 Allergic rhinitis due to pollen: Secondary | ICD-10-CM

## 2021-04-23 MED ORDER — PREDNISONE 50 MG PO TABS: 50.0000 mg | ORAL_TABLET | Freq: Every day | ORAL | 0 refills | Status: DC

## 2021-04-23 NOTE — Telephone Encounter (Signed)
Called [place] pt's brother scheduled telephone visit for today  Copied from [place] [phone]. Topic: General - Other >> Apr 23, 2021  8:28 AM Leward Quan A wrote: Reason for CRM: Patient brother [facility] called in to inform Dr Wynetta Emery that patient is still having runny nose does not feel sick but complains of still feeling stuffy. Patient is taking allergy medication as prescribed but its not helping Please advise  Ph# [phone]

## 2021-04-23 NOTE — Progress Notes (Signed)
There were no vitals taken for this visit.   Subjective:    Patient ID: Ronnie Bullock, male    DOB: 1946-06-06, 75 y.o.   MRN: [id number]  HPI: Ronnie Bullock is a 75 y.o. male  Chief Complaint  Patient presents with   Allergic Rhinitis     Patient brother states allergy medication that was recently prescribed is not helping him. Patient is having runny nose and it is draining down his throat    UPPER RESPIRATORY TRACT INFECTION Duration: about 3 weeks Worst symptom: runny nose and congestion Fever: no Cough: no Shortness of breath: no Wheezing: no Chest pain: no Chest tightness: no Chest congestion: no Nasal congestion: yes Runny nose: yes Post nasal drip: no Sneezing: no Sore throat: no Swollen glands: no Sinus pressure: no Headache: no Face pain: no Toothache: no Ear pain: no  Ear pressure: no  Eyes red/itching:no Eye drainage/crusting: no  Vomiting: no Rash: no Fatigue: no Sick contacts: no Strep contacts: no  Context: stable Recurrent sinusitis: no Relief with OTC cold/cough medications: no  Treatments attempted: anti-histamine    Relevant past medical, surgical, family and social history reviewed and updated as indicated. Interim medical history since our last visit reviewed. Allergies and medications reviewed and updated.  Review of Systems  Constitutional: Negative.   HENT:  Positive for congestion and rhinorrhea. Negative for dental problem, drooling, ear discharge, ear pain, facial swelling, hearing loss, mouth sores, nosebleeds, postnasal drip, sinus pressure, sinus pain, sneezing, sore throat, tinnitus, trouble swallowing and voice change.   Respiratory: Negative.    Cardiovascular: Negative.   Gastrointestinal: Negative.   Musculoskeletal: Negative.   Psychiatric/Behavioral: Negative.     Per HPI unless specifically indicated above     Objective:    There were no vitals taken for this visit.  Wt Readings from Last 3 Encounters:   04/14/21 158 lb (71.7 kg)  04/02/21 160 lb (72.6 kg)  03/04/21 151 lb 6.4 oz (68.7 kg)    Physical Exam Vitals and nursing note reviewed.  Pulmonary:     Effort: Pulmonary effort is normal. No respiratory distress.     Comments: Speaking in full sentences Neurological:     Mental Status: He is alert.  Psychiatric:        Mood and Affect: Mood normal.        Behavior: Behavior normal.        Thought Content: Thought content normal.        Judgment: Judgment normal.    Results for orders placed or performed in visit on 04/14/21  Microscopic Examination   Urine  Result Value Ref Range   WBC, UA 0-5 0 - 5 /hpf   RBC 0-2 0 - 2 /hpf   Epithelial Cells (non renal) None seen 0 - 10 /hpf   Casts Present (A) None seen /lpf   Cast Type Granular casts (A) N/A   Bacteria, UA None seen None seen/Few  Urinalysis, Complete  Result Value Ref Range   Specific Gravity, UA 1.020 1.005 - 1.030   pH, UA 5.5 5.0 - 7.5   Color, UA Yellow Yellow   Appearance Ur Cloudy (A) Clear   Leukocytes,UA Negative Negative   Protein,UA 2+ (A) Negative/Trace   Glucose, UA Negative Negative   Ketones, UA Negative Negative   RBC, UA Negative Negative   Bilirubin, UA Negative Negative   Urobilinogen, Ur 0.2 0.2 - 1.0 mg/dL   Nitrite, UA Negative Negative   Microscopic Examination See below:  BUN+Creat  Result Value Ref Range   BUN 22 8 - 27 mg/dL   Creatinine, Ser 1.39 (H) 0.76 - 1.27 mg/dL   eGFR 53 (L) >59 mL/min/1.73   BUN/Creatinine Ratio 16 10 - 24      Assessment & Plan:   Problem List Items Addressed This Visit   None Visit Diagnoses     Seasonal allergic rhinitis due to pollen    -  Primary   Will treat with prednisone. Continue xyzal. Call with any concerns. Continue to montior.         Follow up plan: Return if symptoms worsen or fail to improve.   This visit was completed via telephone due to the restrictions of the COVID-19 pandemic. All issues as above were discussed and  addressed but no physical exam was performed. If it was felt that the patient should be evaluated in the office, they were directed there. The patient verbally consented to this visit. Patient was unable to complete an audio/visual visit due to Lack of equipment. Due to the catastrophic nature of the COVID-19 pandemic, this visit was done through audio contact only. Location of the patient:  home Location of the provider: work Those involved with this call:  Provider: Park Liter, DO CMA: Louanna Raw, [place] Desk/Registration: Jill Side  Time spent on call:  15 minutes on the phone discussing health concerns. 23 minutes total spent in review of patient's record and preparation of their chart.

## 2021-04-23 NOTE — Telephone Encounter (Signed)
Being seen today 

## 2021-04-26 ENCOUNTER — Telehealth: Payer: Self-pay | Admitting: Family Medicine

## 2021-04-26 NOTE — Telephone Encounter (Signed)
Copied from [place] [id number]. Topic: Medicare AWV >> Apr 26, 2021  1:56 PM Cher Nakai R wrote: Reason for CRM:  Left message for patient to call back and schedule the Medicare Annual Wellness Visit (AWV) virtually or by telephone.  Last AWV 12/12/2019  Please schedule at anytime with [facility]Nurse Health Advisor.  45 minute appointment  Any questions, please call me at [phone]

## 2021-05-04 ENCOUNTER — Ambulatory Visit: Payer: Medicare HMO | Admitting: Family Medicine

## 2021-06-04 ENCOUNTER — Encounter: Payer: Self-pay | Admitting: Family Medicine

## 2021-06-16 IMAGING — CT CT HEAD W/O CM
3 series · 15 of 47 positions shown, 18 images · non-contrast
Comparison: 02/20/2014

CLINICAL DATA: Altered mental status

EXAM:
CT HEAD WITHOUT CONTRAST
TECHNIQUE: Contiguous axial images were obtained from the base of the skull
through the vertex without intravenous contrast.

[Series 3: head wo · axial · 0.46mm/px · z∈[-139,-14]mm · 9 of 31 slices shown, 12 images]
[im 3/31  brain]
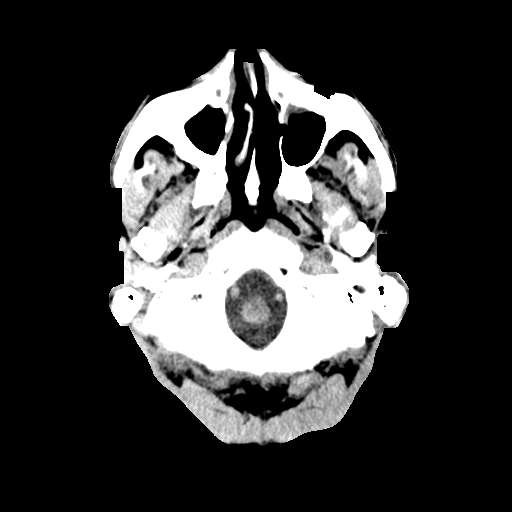
[im 3/31  bone]
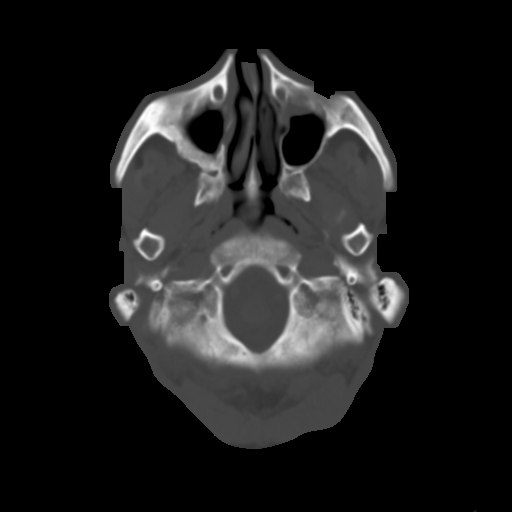
[im 6/31  brain]
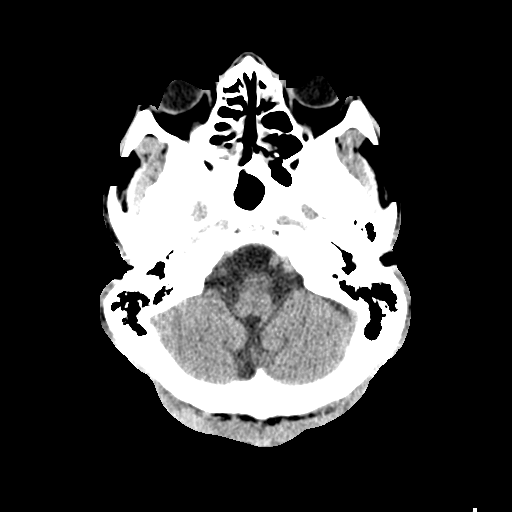
[im 9/31  brain]
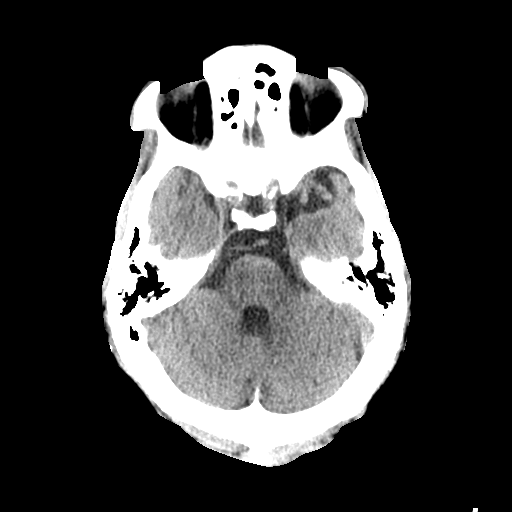
[im 12/31  brain]
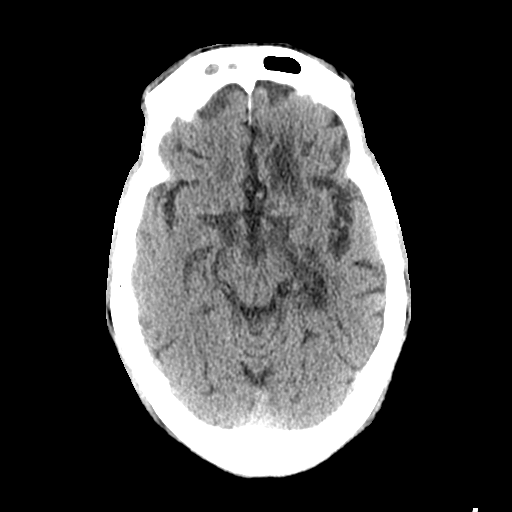
[im 16/31  brain]
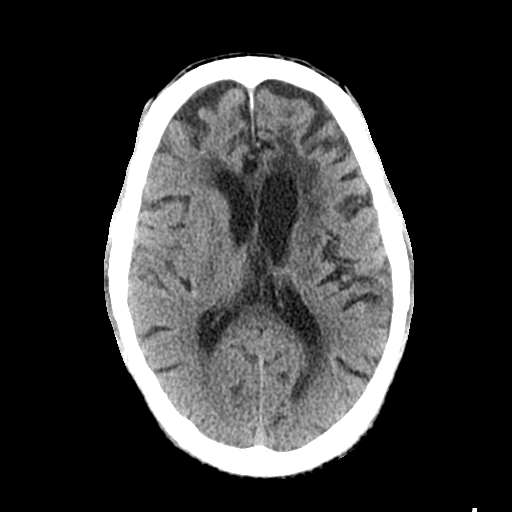
[im 16/31  bone]
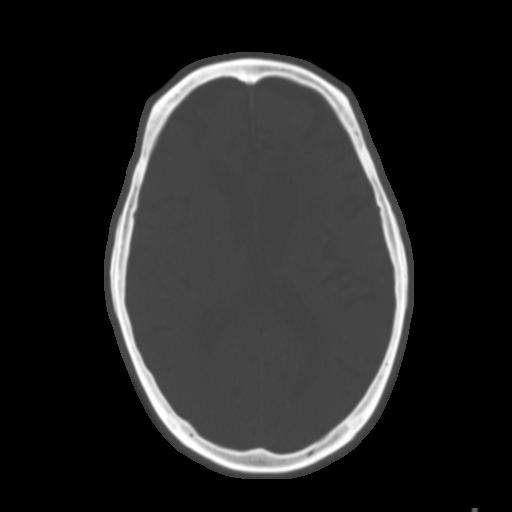
[im 19/31  brain]
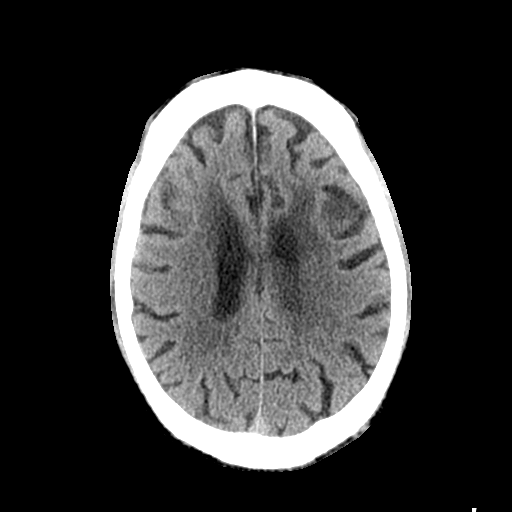
[im 22/31  brain]
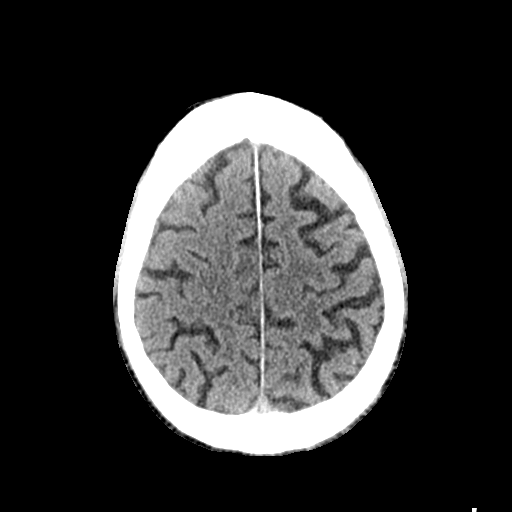
[im 25/31  brain]
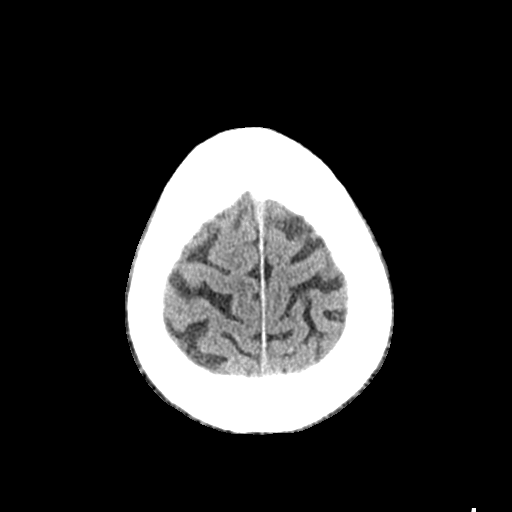
[im 28/31  brain]
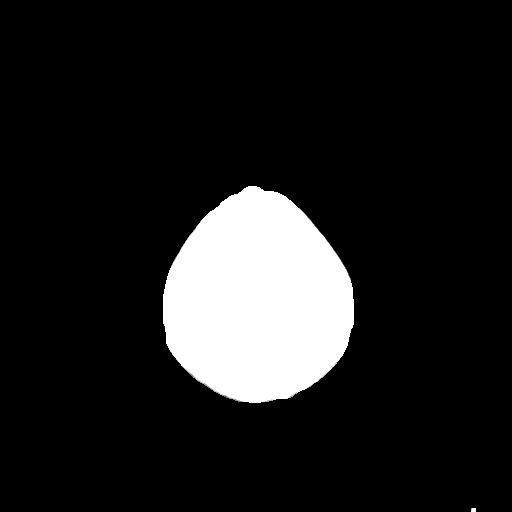
[im 28/31  bone]
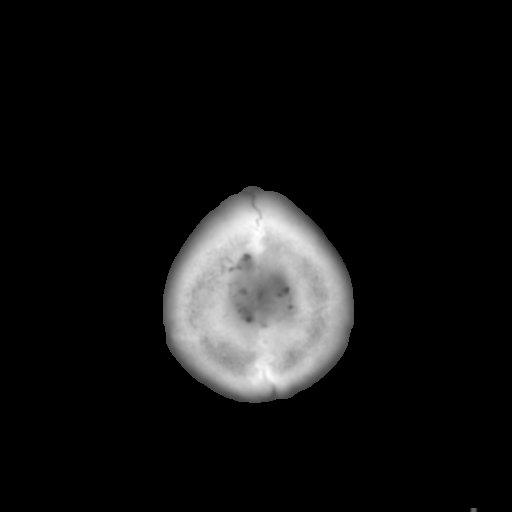

[Series 4: coronal soft tissue · coronal · 0.34mm/px · 3 of 73 slices shown]
[im 25/73  brain]
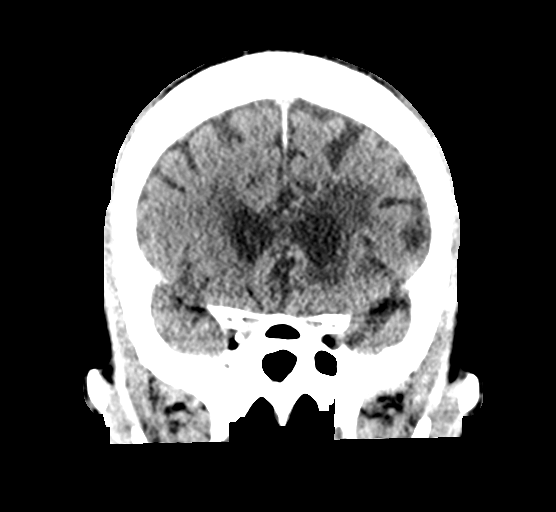
[im 33/73  brain]
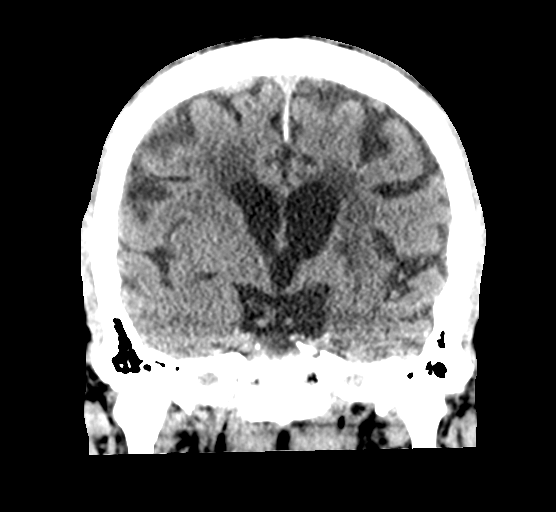
[im 41/73  brain]
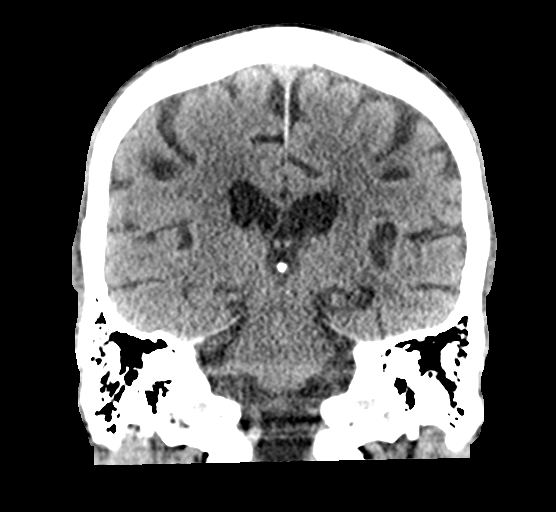

[Series 5: sagittal soft tissue · sagittal · 0.34mm/px · 3 of 53 slices shown]
[im 18/53  brain]
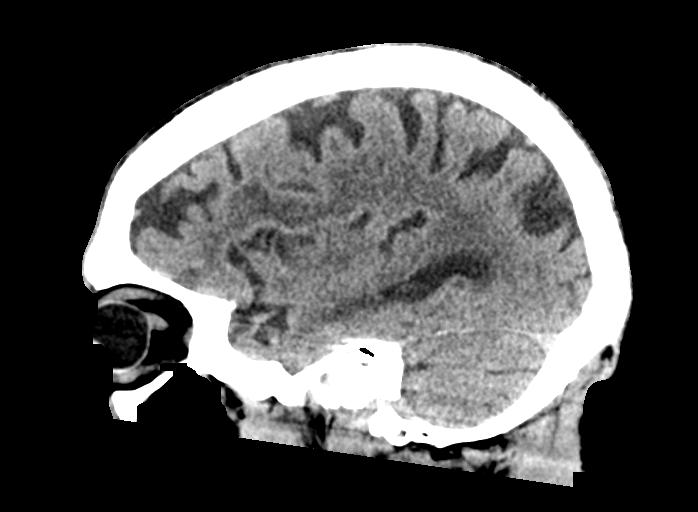
[im 27/53  brain]
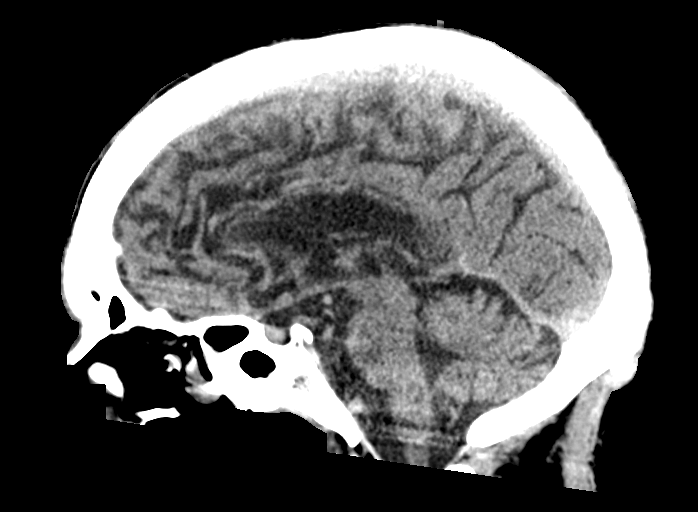
[im 35/53  brain]
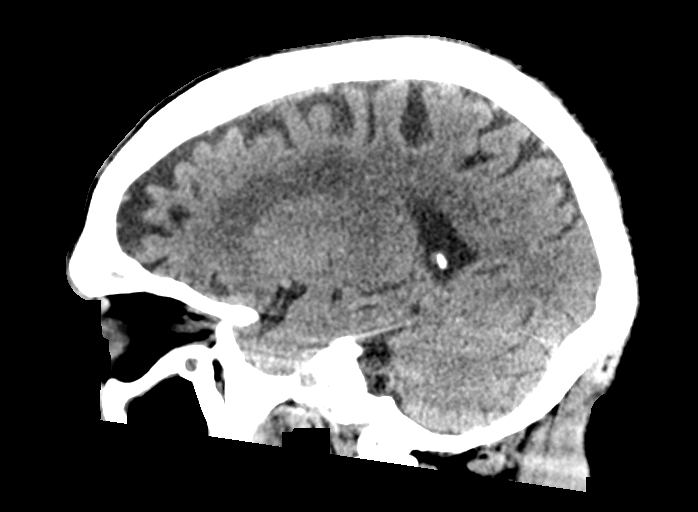

[15 of 47 positions shown; findings below may reference images not displayed]

FINDINGS: Brain: No evidence of acute infarction, hemorrhage, hydrocephalus,
extra-axial collection or mass lesion/mass effect. Chronic
encephalomalacia within the left basal ganglia and left frontal lobe
related to remote prior infarcts. Moderate low-density changes
within the periventricular and subcortical white matter compatible
with chronic microvascular ischemic change. Mild diffuse cerebral
volume loss.

Vascular: Atherosclerotic calcifications involving the large vessels
of the skull base. No unexpected hyperdense vessel.

Skull: Normal. Negative for fracture or focal lesion.

Sinuses/Orbits: Chronic pansinus disease with complete opacification
and mild expansion of the right frontal sinus. Mastoid air cells are
clear. Orbital structures within normal limits.

Other: None.
IMPRESSION: 1. No acute intracranial findings.
2. Chronic microvascular ischemic change and cerebral volume loss.
3. Chronic pansinus disease.

## 2021-07-27 ENCOUNTER — Ambulatory Visit (INDEPENDENT_AMBULATORY_CARE_PROVIDER_SITE_OTHER): Payer: Medicare HMO

## 2021-07-27 DIAGNOSIS — Z0000 Encounter for general adult medical examination without abnormal findings: Secondary | ICD-10-CM

## 2021-07-27 NOTE — Patient Instructions (Signed)
Health Maintenance, Male Adopting a healthy lifestyle and getting preventive care are important in promoting health and wellness. Ask your health care provider about: The right schedule for you to have regular tests and exams. Things you can do on your own to prevent diseases and keep yourself healthy. What should I know about diet, weight, and exercise? Eat a healthy diet  Eat a diet that includes plenty of vegetables, fruits, low-fat dairy products, and lean protein. Do not eat a lot of foods that are high in solid fats, added sugars, or sodium. Maintain a healthy weight Body mass index (BMI) is a measurement that can be used to identify possible weight problems. It estimates body fat based on height and weight. Your health care provider can help determine your BMI and help you achieve or maintain a healthy weight. Get regular exercise Get regular exercise. This is one of the most important things you can do for your health. Most adults should: Exercise for at least 150 minutes each week. The exercise should increase your heart rate and make you sweat (moderate-intensity exercise). Do strengthening exercises at least twice a week. This is in addition to the moderate-intensity exercise. Spend less time sitting. Even light physical activity can be beneficial. Watch cholesterol and blood lipids Have your blood tested for lipids and cholesterol at 76 years of age, then have this test every 5 years. You may need to have your cholesterol levels checked more often if: Your lipid or cholesterol levels are high. You are older than 76 years of age. You are at high risk for heart disease. What should I know about cancer screening? Many types of cancers can be detected early and may often be prevented. Depending on your health history and family history, you may need to have cancer screening at various ages. This may include screening for: Colorectal cancer. Prostate cancer. Skin cancer. Lung  cancer. What should I know about heart disease, diabetes, and high blood pressure? Blood pressure and heart disease High blood pressure causes heart disease and increases the risk of stroke. This is more likely to develop in people who have high blood pressure readings, are of African descent, or are overweight. Talk with your health care provider about your target blood pressure readings. Have your blood pressure checked: Every 3-5 years if you are 18-39 years of age. Every year if you are 40 years old or older. If you are between the ages of 65 and 75 and are a current or former smoker, ask your health care provider if you should have a one-time screening for abdominal aortic aneurysm (AAA). Diabetes Have regular diabetes screenings. This checks your fasting blood sugar level. Have the screening done: Once every three years after age 45 if you are at a normal weight and have a low risk for diabetes. More often and at a younger age if you are overweight or have a high risk for diabetes. What should I know about preventing infection? Hepatitis B If you have a higher risk for hepatitis B, you should be screened for this virus. Talk with your health care provider to find out if you are at risk for hepatitis B infection. Hepatitis C Blood testing is recommended for: Everyone born from 1945 through 1965. Anyone with known risk factors for hepatitis C. Sexually transmitted infections (STIs) You should be screened each year for STIs, including gonorrhea and chlamydia, if: You are sexually active and are younger than 76 years of age. You are older than 76 years   of age and your health care provider tells you that you are at risk for this type of infection. Your sexual activity has changed since you were last screened, and you are at increased risk for chlamydia or gonorrhea. Ask your health care provider if you are at risk. Ask your health care provider about whether you are at high risk for HIV.  Your health care provider may recommend a prescription medicine to help prevent HIV infection. If you choose to take medicine to prevent HIV, you should first get tested for HIV. You should then be tested every 3 months for as long as you are taking the medicine. Follow these instructions at home: Lifestyle Do not use any products that contain nicotine or tobacco, such as cigarettes, e-cigarettes, and chewing tobacco. If you need help quitting, ask your health care provider. Do not use street drugs. Do not share needles. Ask your health care provider for help if you need support or information about quitting drugs. Alcohol use Do not drink alcohol if your health care provider tells you not to drink. If you drink alcohol: Limit how much you have to 0-2 drinks a day. Be aware of how much alcohol is in your drink. In the [place] one drink equals one 12 oz bottle of beer (355 mL), one 5 oz glass of wine (148 mL), or one 1 oz glass of hard liquor (44 mL). General instructions Schedule regular health, dental, and eye exams. Stay current with your vaccines. Tell your health care provider if: You often feel depressed. You have ever been abused or do not feel safe at home. Summary Adopting a healthy lifestyle and getting preventive care are important in promoting health and wellness. Follow your health care provider's instructions about healthy diet, exercising, and getting tested or screened for diseases. Follow your health care provider's instructions on monitoring your cholesterol and blood pressure. This information is not intended to replace advice given to you by your health care provider. Make sure you discuss any questions you have with your health care provider. Document Revised: 01/01/2021 Document Reviewed: 10/17/2018 Elsevier Patient Education  2022 Elsevier Inc.  

## 2021-07-27 NOTE — Progress Notes (Signed)
Subjective:   Ronnie Bullock is a 75 y.o. male who presents for Medicare Annual/Subsequent preventive examination.I connected with  Ronnie Bullock on 07/27/21 by a audio enabled telemedicine application and verified that I am speaking with the correct person using two identifiers.   I discussed the limitations of evaluation and management by telemedicine. The patient expressed understanding and agreed to proceed.   Location of patient: home Location of provider: office  Review of Systems    Defer to PCP Cardiac Risk Factors include: none     Objective:    There were no vitals filed for this visit. There is no height or weight on file to calculate BMI.  Advanced Directives 02/12/2021 01/20/2021 08/11/2020 02/12/2020 12/12/2019 11/13/2019 07/04/2019  Does Patient Have a Medical Advance Directive? Yes Yes No No Yes No Unable to assess, patient is non-responsive or altered mental status  Type of Scientist, forensic Power of [place];Living will [place];Living will - - Living will;Healthcare Power of Attorney - -  Does patient want to make changes to medical advance directive? - No - Patient declined - - - - -  Copy of [place] in Chart? No - copy requested No - copy requested - - No - copy requested - -  Would patient like information on creating a medical advance directive? No - Patient declined No - Patient declined No - Patient declined No - Patient declined - No - Patient declined -    Current Medications (verified) Outpatient Encounter Medications as of 07/27/2021  Medication Sig   acetaminophen (TYLENOL) 325 MG tablet Take 2 tablets (650 mg total) by mouth every 6 (six) hours as needed for fever, headache or mild pain.   amLODipine (NORVASC) 5 MG tablet Take 1 tablet (5 mg total) by mouth daily.   apixaban (ELIQUIS) 5 MG TABS tablet Take 1 tablet (5 mg total) by mouth 2 (two) times daily.   levocetirizine (XYZAL ALLERGY 24HR) 5 MG tablet  Take 1 tablet (5 mg total) by mouth every evening.   Multiple Vitamin (MULTIVITAMIN WITH MINERALS) TABS tablet Take 1 tablet by mouth daily.   polyethylene glycol powder (GLYCOLAX/MIRALAX) 17 GM/SCOOP powder Take 17 g by mouth daily.   tamsulosin (FLOMAX) 0.4 MG CAPS capsule Take 1 capsule (0.4 mg total) by mouth daily.   predniSONE (DELTASONE) 50 MG tablet Take 1 tablet (50 mg total) by mouth daily with breakfast. (Patient not taking: Reported on 07/27/2021)   No facility-administered encounter medications on file as of 07/27/2021.    Allergies (verified) Patient has no known allergies.   History: Past Medical History:  Diagnosis Date   Dementia ([place])    Dementia ([place])    Hyperlipidemia    Hypertension    Incontinence    Past Surgical History:  Procedure Laterality Date   COLONOSCOPY WITH PROPOFOL N/A 05/16/2018   Procedure: COLONOSCOPY WITH PROPOFOL;  Surgeon: Virgel Manifold, MD;  Location: [facility] ENDOSCOPY;  Service: Endoscopy;  Laterality: N/A;   PERIPHERAL VASCULAR THROMBECTOMY Left 01/22/2021   Procedure: PERIPHERAL VASCULAR THROMBECTOMY / THROMBOLYSIS;  Surgeon: Katha Cabal, MD;  Location: [facility];  Service: Cardiovascular;  Laterality: Left;   Family History  Problem Relation Age of Onset   Hypertension Mother    Prostate cancer Neg Hx    Bladder Cancer Neg Hx    Kidney cancer Neg Hx    Social History   Socioeconomic History   Marital status: Widowed    Spouse  name: Not on file   Number of children: Not on file   Years of education: Not on file   Highest education level: 12th grade  Occupational History   Not on file  Tobacco Use   Smoking status: Never   Smokeless tobacco: Never  Vaping Use   Vaping Use: Never used  Substance and Sexual Activity   Alcohol use: No    Alcohol/week: 0.0 standard drinks   Drug use: No   Sexual activity: Yes  Other Topics Concern   Not on file  Social History Narrative   Not on file   Social  Determinants of Health   Financial Resource Strain: Low Risk    Difficulty of Paying Living Expenses: Not hard at all  Food Insecurity: No Food Insecurity   Worried About Charity fundraiser in the Last Year: Never true   Ran Out of Food in the Last Year: Never true  Transportation Needs: No Transportation Needs   Lack of Transportation (Medical): No   Lack of Transportation (Non-Medical): No  Physical Activity: Not on file  Stress: No Stress Concern Present   Feeling of Stress : Not at all  Social Connections: Moderately Isolated   Frequency of Communication with Friends and Family: Once a week   Frequency of Social Gatherings with Friends and Family: Twice a week   Attends Religious Services: 1 to 4 times per year   Active Member of [employer] or Organizations: No   Attends Archivist Meetings: Never   Marital Status: Widowed    Tobacco Counseling Counseling given: Not Answered   Clinical Intake:  Pre-visit preparation completed: Yes  Pain : No/denies pain     Nutritional Risks: None Diabetes: No  How often do you need to have someone help you when you read instructions, pamphlets, or other written materials from your doctor or pharmacy?: 1 - Never What is the last grade level you completed in school?: 12th  Diabetic?no  Interpreter Needed?: No      Activities of Daily Living In your present state of health, do you have any difficulty performing the following activities: 07/27/2021 04/02/2021  Hearing? N N  Vision? N N  Difficulty concentrating or making decisions? N N  Walking or climbing stairs? N N  Dressing or bathing? N N  Doing errands, shopping? N N  Preparing Food and eating ? N -  Using the Toilet? N -  In the past six months, have you accidently leaked urine? N -  Do you have problems with loss of bowel control? N -  Managing your Medications? N -  Managing your Finances? N -  Housekeeping or managing your Housekeeping? N -  Some recent  data might be hidden    Patient Care Team: [name], DO as PCP - General (Family Medicine) Bernardo Heater, Ronda Fairly, MD (Urology)  Indicate any recent Medical Services you may have received from other than [facility] providers in the past year (date may be approximate).     Assessment:   This is a routine wellness examination for [place].  Hearing/Vision screen No results found.  Dietary issues and exercise activities discussed: Current Exercise Habits: Home exercise routine, Type of exercise: walking, Time (Minutes): 20, Frequency (Times/Week): 2, Weekly Exercise (Minutes/Week): 40, Exercise limited by: None identified   Goals Addressed   None   Depression Screen PHQ 2/9 Scores 07/27/2021 04/02/2021 02/09/2021 09/10/2020 01/23/2020 12/12/2019 12/10/2018  PHQ - 2 Score 0 0 0 0 0 0 0  PHQ- 9 Score - - - 0 - - -    Fall Risk Fall Risk  07/27/2021 04/02/2021 02/09/2021 09/10/2020 01/23/2020  Falls in the past year? 0 0 0 0 0  Number falls in past yr: 0 0 0 - 0  Injury with Fall? 0 0 0 - 0  Risk for fall due to : Impaired mobility History of fall(s) No Fall Risks - -  Follow up Education provided Falls evaluation completed - Falls evaluation completed Falls evaluation completed    [place]:  Any stairs in or around the home? No  If so, are there any without handrails?  N/A Home free of loose throw rugs in walkways, pet beds, electrical cords, etc? No  Adequate lighting in your home to reduce risk of falls? No   ASSISTIVE DEVICES UTILIZED TO PREVENT FALLS:  Life alert? No  Use of a cane, [place]ha [place]sky or w/c? No  Grab bars in the bathroom? Yes  Shower chair or bench in shower? Yes  Elevated toilet seat or a handicapped toilet? Yes   TIMED UP AND GO:  Was the test performed?  N/A .  Length of time to ambulate 10 feet: N/A sec.    Cognitive Function:     6CIT Screen 07/27/2021 12/10/2018 11/10/2017  What Year? 4 points 0 points 0 points  What month? 0 points 0  points 0 points  What time? 3 points 0 points 0 points  Count back from 20 0 points 0 points 0 points  Months in reverse 0 points 0 points 0 points  Repeat phrase 0 points 8 points 10 points  Total Score 7 8 10     Immunizations Immunization History  Administered Date(s) Administered   Fluad Quad(high Dose 65+) 01/23/2020, 11/10/2020   Influenza, High Dose Seasonal PF 10/10/2017, 12/10/2018   Pneumococcal Conjugate-13 10/10/2017   Pneumococcal Polysaccharide-23 12/10/2018   Td 01/01/2019    TDAP status: Up to date  Flu Vaccine status: Due, Education has been provided regarding the importance of this vaccine. Advised may receive this vaccine at local pharmacy or Health Dept. Aware to provide a copy of the vaccination record if obtained from local pharmacy or Health Dept. Verbalized acceptance and understanding.  Pneumococcal vaccine status: Up to date  Covid-19 vaccine status: Completed vaccines  Qualifies for Shingles Vaccine? Yes   Zostavax completed No   Shingrix Completed?: No.    Education has been provided regarding the importance of this vaccine. Patient has been advised to call insurance company to determine out of pocket expense if they have not yet received this vaccine. Advised may also receive vaccine at local pharmacy or Health Dept. Verbalized acceptance and understanding.  Screening Tests Health Maintenance  Topic Date Due   COVID-19 Vaccine (1) Never done   Zoster Vaccines- Shingrix (1 of 2) Never done   INFLUENZA VACCINE  06/07/2021   COLONOSCOPY (Pts 45-73yrs Insurance coverage will need to be confirmed)  05/17/2023   TETANUS/TDAP  01/01/2029   Hepatitis C Screening  Completed   HPV VACCINES  Aged Out    Health Maintenance  Health Maintenance Due  Topic Date Due   COVID-19 Vaccine (1) Never done   Zoster Vaccines- Shingrix (1 of 2) Never done   INFLUENZA VACCINE  06/07/2021    Colorectal cancer screening: Type of screening: Colonoscopy. Completed  01/01/19. Repeat every 5 years  Lung Cancer Screening: (Low Dose CT Chest recommended if Age 43-80 years, 30 pack-year currently smoking OR have  quit w/in 15years.) does not qualify.   Lung Cancer Screening Referral: N/A  Additional Screening:  Hepatitis C Screening: does not qualify; Completed 01/01/19  Vision Screening: Recommended annual ophthalmology exams for early detection of glaucoma and other disorders of the eye. Is the patient up to date with their annual eye exam?  Yes  Who is the provider or what is the name of the office in which the patient attends annual eye exams?Patient did not know If pt is not established with a provider, would they like to be referred to a provider to establish care? Yes .   Dental Screening: Recommended annual dental exams for proper oral hygiene  Community Resource Referral / Chronic Care Management: CRR required this visit?  No   CCM required this visit?  No      Plan:     I have personally reviewed and noted the following in the patient's chart:   Medical and social history Use of alcohol, tobacco or illicit drugs  Current medications and supplements including opioid prescriptions. Patient is not currently taking opioid prescriptions. Functional ability and status Nutritional status Physical activity Advanced directives List of other physicians Hospitalizations, surgeries, and ER visits in previous 12 months Vitals Screenings to include cognitive, depression, and falls Referrals and appointments  In addition, I have reviewed and discussed with patient certain preventive protocols, quality metrics, and best practice recommendations. A written personalized care plan for preventive services as well as general preventive health recommendations were provided to patient.     Linus Galas, [place]   07/27/2021   Nurse Notes: Non face to face 45 minutes  Mr. Tuel , Thank you for taking time to come for your Medicare Wellness Visit. I  appreciate your ongoing commitment to your health goals. Please review the following plan we discussed and let me know if I can assist you in the future.   These are the goals we discussed:  Goals      DIET - INCREASE WATER INTAKE     Recommend drinking at least 6-8 glasses of water a day         This is a list of the screening recommended for you and due dates:  Health Maintenance  Topic Date Due   COVID-19 Vaccine (1) Never done   Zoster (Shingles) Vaccine (1 of 2) Never done   Flu Shot  06/07/2021   Colon Cancer Screening  05/17/2023   Tetanus Vaccine  01/01/2029   Hepatitis C Screening: USPSTF Recommendation to screen - Ages 18-79 yo.  Completed   HPV Vaccine  Aged Out

## 2021-08-05 ENCOUNTER — Encounter (INDEPENDENT_AMBULATORY_CARE_PROVIDER_SITE_OTHER): Payer: Medicare HMO

## 2021-08-05 ENCOUNTER — Ambulatory Visit (INDEPENDENT_AMBULATORY_CARE_PROVIDER_SITE_OTHER): Payer: Medicare HMO | Admitting: Vascular Surgery

## 2021-08-05 ENCOUNTER — Other Ambulatory Visit: Payer: Medicare HMO

## 2021-08-09 ENCOUNTER — Inpatient Hospital Stay: Payer: Medicare HMO | Attending: Radiation Oncology

## 2021-08-09 ENCOUNTER — Ambulatory Visit: Payer: Self-pay | Admitting: Urology

## 2021-08-12 ENCOUNTER — Ambulatory Visit: Payer: Medicare HMO | Admitting: Radiation Oncology

## 2021-08-19 ENCOUNTER — Ambulatory Visit (INDEPENDENT_AMBULATORY_CARE_PROVIDER_SITE_OTHER): Payer: Medicare HMO | Admitting: Vascular Surgery

## 2021-08-19 ENCOUNTER — Other Ambulatory Visit: Payer: Self-pay

## 2021-08-19 ENCOUNTER — Ambulatory Visit (INDEPENDENT_AMBULATORY_CARE_PROVIDER_SITE_OTHER): Payer: Medicare HMO

## 2021-08-19 VITALS — BP 160/100 | HR 97 | Resp 16 | Wt 170.8 lb

## 2021-08-19 DIAGNOSIS — I824Y2 Acute embolism and thrombosis of unspecified deep veins of left proximal lower extremity: Secondary | ICD-10-CM

## 2021-08-19 DIAGNOSIS — I10 Essential (primary) hypertension: Secondary | ICD-10-CM

## 2021-08-19 DIAGNOSIS — I25118 Atherosclerotic heart disease of native coronary artery with other forms of angina pectoris: Secondary | ICD-10-CM | POA: Diagnosis not present

## 2021-08-19 DIAGNOSIS — E7800 Pure hypercholesterolemia, unspecified: Secondary | ICD-10-CM | POA: Diagnosis not present

## 2021-08-24 ENCOUNTER — Encounter (INDEPENDENT_AMBULATORY_CARE_PROVIDER_SITE_OTHER): Payer: Self-pay | Admitting: Vascular Surgery

## 2021-08-24 DIAGNOSIS — I2510 Atherosclerotic heart disease of native coronary artery without angina pectoris: Secondary | ICD-10-CM | POA: Insufficient documentation

## 2021-08-24 NOTE — Progress Notes (Signed)
MRN : [id number]  Ronnie Bullock is a 75 y.o. (1946-02-03) male who presents with chief complaint of follow up DVT.  History of Present Illness:    The patient presents to the office for evaluation of DVT.  DVT was identified at [facility] by Duplex ultrasound.  The initial symptoms were pain and swelling in the lower extremity.  The patient notes the leg continues to be very painful with dependency and swells quite a bite.  Symptoms are much better with elevation.  The patient notes minimal edema in the morning which steadily worsens throughout the day.    The patient has not been using compression therapy at this point.  No SOB or pleuritic chest pains.  No cough or hemoptysis.  No blood per rectum or blood in any sputum.  No excessive bruising per the patient.       Current Meds  Medication Sig   acetaminophen (TYLENOL) 325 MG tablet Take 2 tablets (650 mg total) by mouth every 6 (six) hours as needed for fever, headache or mild pain.   apixaban (ELIQUIS) 5 MG TABS tablet Take 1 tablet (5 mg total) by mouth 2 (two) times daily.   levocetirizine (XYZAL ALLERGY 24HR) 5 MG tablet Take 1 tablet (5 mg total) by mouth every evening.   Multiple Vitamin (MULTIVITAMIN WITH MINERALS) TABS tablet Take 1 tablet by mouth daily.   polyethylene glycol powder (GLYCOLAX/MIRALAX) 17 GM/SCOOP powder Take 17 g by mouth daily.   tamsulosin (FLOMAX) 0.4 MG CAPS capsule Take 1 capsule (0.4 mg total) by mouth daily.    Past Medical History:  Diagnosis Date   Dementia ([place])    Dementia ([place])    Hyperlipidemia    Hypertension    Incontinence     Past Surgical History:  Procedure Laterality Date   COLONOSCOPY WITH PROPOFOL N/A 05/16/2018   Procedure: COLONOSCOPY WITH PROPOFOL;  Surgeon: Virgel Manifold, MD;  Location: [facility] ENDOSCOPY;  Service: Endoscopy;  Laterality: N/A;   PERIPHERAL VASCULAR THROMBECTOMY Left 01/22/2021   Procedure: PERIPHERAL VASCULAR THROMBECTOMY / THROMBOLYSIS;  Surgeon:  Katha Cabal, MD;  Location: [facility];  Service: Cardiovascular;  Laterality: Left;    Social History Social History   Tobacco Use   Smoking status: Never   Smokeless tobacco: Never  Vaping Use   Vaping Use: Never used  Substance Use Topics   Alcohol use: No    Alcohol/week: 0.0 standard drinks   Drug use: No    Family History Family History  Problem Relation Age of Onset   Hypertension Mother    Prostate cancer Neg Hx    Bladder Cancer Neg Hx    Kidney cancer Neg Hx     No Known Allergies   REVIEW OF SYSTEMS (Negative unless checked)  Constitutional: [] Weight loss  [] Fever  [] Chills Cardiac: [] Chest pain   [] Chest pressure   [] Palpitations   [] Shortness of breath when laying flat   [] Shortness of breath with exertion. Vascular:  [] Pain in legs with walking   [] Pain in legs at rest  [x] History of DVT   [] Phlebitis   [x] Swelling in legs   [] Varicose veins   [] Non-healing ulcers Pulmonary:   [] Uses home oxygen   [] Productive cough   [] Hemoptysis   [] Wheeze  [] COPD   [] Asthma Neurologic:  [] Dizziness   [] Seizures   [] History of stroke   [] History of TIA  [] Aphasia   [] Vissual changes   [] Weakness or numbness in arm   [] Weakness or numbness  in leg Musculoskeletal:   [] Joint swelling   [] Joint pain   [] Low back pain Hematologic:  [] Easy bruising  [] Easy bleeding   [] Hypercoagulable state   [] Anemic Gastrointestinal:  [] Diarrhea   [] Vomiting  [] Gastroesophageal reflux/heartburn   [] Difficulty swallowing. Genitourinary:  [] Chronic kidney disease   [] Difficult urination  [] Frequent urination   [] Blood in urine Skin:  [] Rashes   [] Ulcers  Psychological:  [] History of anxiety   []  History of major depression.  Physical Examination  Vitals:   08/19/21 1525  BP: (!) 160/100  Pulse: 97  Resp: 16  Weight: 170 lb 12.8 oz (77.5 kg)   Body mass index is 25.22 kg/m. Gen: WD/WN, NAD Head: [place]/AT, No temporalis wasting.  Ear/Nose/Throat: Hearing grossly intact,  nares w/o erythema or drainage, pinna without lesions Eyes: PER, EOMI, sclera nonicteric.  Neck: Supple, no gross masses.  No JVD.  Pulmonary:  Good air movement, no audible wheezing, no use of accessory muscles.  Cardiac: RRR, precordium not hyperdynamic. Vascular:  scattered varicosities present bilaterally.  Mild venous stasis changes to the legs bilaterally.  2+ soft pitting edema  Vessel Right Left  Radial Palpable Palpable  Gastrointestinal: soft, non-distended. No guarding/no peritoneal signs.  Musculoskeletal: M/S 5/5 throughout.  No deformity.  Neurologic: CN 2-12 intact. Pain and light touch intact in extremities.  Symmetrical.  Speech is fluent. Motor exam as listed above. Psychiatric: Judgment intact, Mood & affect appropriate for pt's clinical situation. Dermatologic: Venous rashes no ulcers noted.  No changes consistent with cellulitis. Lymph : No lichenification or skin changes of chronic lymphedema.  CBC Lab Results  Component Value Date   WBC 5.3 04/02/2021   HGB 13.4 04/02/2021   HCT 40.9 04/02/2021   MCV 92 04/02/2021   PLT 254 04/02/2021    BMET    Component Value Date/Time   NA 141 04/02/2021 1410   K 4.4 04/02/2021 1410   CL 105 04/02/2021 1410   CO2 22 04/02/2021 1410   GLUCOSE 103 (H) 04/02/2021 1410   GLUCOSE 105 (H) 02/16/2021 0549   BUN 22 04/14/2021 1138   CREATININE 1.39 (H) 04/14/2021 1138   CALCIUM 9.2 04/02/2021 1410   GFRNONAA 58 (L) 02/16/2021 0549   GFRAA 84 09/10/2020 1441   CrCl cannot be calculated (Patient's most recent lab result is older than the maximum 21 days allowed.).  COAG Lab Results  Component Value Date   INR 1.2 01/20/2021   INR 1.0 12/02/2020    Radiology No results found.   Assessment/Plan 1. 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  IVC filter is not indicated at present.  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  The patient is tolerating anticoagulation, Eliquis   Elevation was stressed, use of a recliner was discussed.  I have had a long discussion with the patient regarding DVT and post phlebitic changes such as swelling and why it  causes symptoms such as pain.  The patient will wear graduated compression stockings class 1 (20-30 mmHg), beginning after three full days of anticoagulation, on a daily basis a prescription was given. The patient will  beginning wearing the stockings first thing in the morning and removing them in the evening. The patient is instructed specifically not to sleep in the stockings.  In addition, behavioral modification including elevation during the  day and avoidance of prolonged dependency will be initiated.    The patient will continue anticoagulation for now as there have not been any problems or complications at this point.    2. Essential hypertension, benign Continue antihypertensive medications as already ordered, these medications have been reviewed and there are no changes at this time.   3. Hypercholesteremia Continue statin as ordered and reviewed, no changes at this time   4. Coronary artery disease of native artery of native heart with stable angina pectoris (HCC) Continue cardiac and antihypertensive medications as already ordered and reviewed, no changes at this time.  Continue statin as ordered and reviewed, no changes at this time  Nitrates PRN for chest pain    Hortencia Pilar, MD  08/24/2021 2:39 PM

## 2021-08-27 ENCOUNTER — Telehealth: Payer: Self-pay | Admitting: Family Medicine

## 2021-08-27 NOTE — Telephone Encounter (Signed)
Copied from [place] [phone]. Topic: General - Other >> Aug 27, 2021  1:41 PM Yvette Rack wrote: Reason for CRM: Pt brother Linward Foster stated pt sister [name] would like to request that pt be tested for dementia and alzheimer. Per [place], [place] would like to be present for the testing. Cb# [phone]

## 2021-08-31 NOTE — Telephone Encounter (Signed)
Patient has appointment scheduled for 09/06/2021.

## 2021-09-02 ENCOUNTER — Telehealth: Payer: Self-pay | Admitting: Family Medicine

## 2021-09-02 ENCOUNTER — Ambulatory Visit: Payer: Medicare HMO | Admitting: Family Medicine

## 2021-09-02 NOTE — Telephone Encounter (Signed)
FYI for appointment on Monday.

## 2021-09-02 NOTE — Telephone Encounter (Signed)
FYI for appointment scheduled 09/06/2021.   Copied from [place] [phone]. Topic: General - Other >> Sep 02, 2021 10:45 AM Tessa Lerner A wrote: Reason for CRM: The patient's sister has called with concerns for the patient's behavior and wellfare  The patient has left their house without explanation of why they're leaving or where they're going for the third time this week  The patient got a new phone with a number that he wont give to siblings   The patient's family would like for Dr. Wynetta Emery to possibly touch on their concerns during the patient's visit on 09/06/21  The patient has been experiencing long term memory concerns   The patient's brother [facility]) will be attending the appointment with the patient and the patient's family would like for Mr. Sherrye Payor to be encouraged to come back with the patient

## 2021-09-03 ENCOUNTER — Ambulatory Visit: Payer: Medicare HMO | Admitting: Urology

## 2021-09-06 ENCOUNTER — Ambulatory Visit: Payer: Medicare HMO | Admitting: Family Medicine

## 2021-09-06 NOTE — Telephone Encounter (Signed)
Can we please let her know that he didn't keep his appointment today. I'm happy to discuss this when he comes back in.

## 2021-09-10 ENCOUNTER — Encounter: Payer: Self-pay | Admitting: Family Medicine

## 2021-09-10 ENCOUNTER — Ambulatory Visit (INDEPENDENT_AMBULATORY_CARE_PROVIDER_SITE_OTHER): Payer: Medicare HMO | Admitting: Family Medicine

## 2021-09-10 ENCOUNTER — Other Ambulatory Visit: Payer: Self-pay

## 2021-09-10 VITALS — BP 140/82 | HR 90 | Temp 97.5°F | Wt 166.4 lb

## 2021-09-10 DIAGNOSIS — E7800 Pure hypercholesterolemia, unspecified: Secondary | ICD-10-CM | POA: Diagnosis not present

## 2021-09-10 DIAGNOSIS — R339 Retention of urine, unspecified: Secondary | ICD-10-CM | POA: Diagnosis not present

## 2021-09-10 DIAGNOSIS — Z23 Encounter for immunization: Secondary | ICD-10-CM | POA: Diagnosis not present

## 2021-09-10 DIAGNOSIS — F0390 Unspecified dementia without behavioral disturbance: Secondary | ICD-10-CM | POA: Diagnosis not present

## 2021-09-10 DIAGNOSIS — I10 Essential (primary) hypertension: Secondary | ICD-10-CM

## 2021-09-10 MED ORDER — AMLODIPINE BESYLATE 5 MG PO TABS: 5.0000 mg | ORAL_TABLET | Freq: Every day | ORAL | 1 refills | Status: DC

## 2021-09-10 MED ORDER — DONEPEZIL HCL 5 MG PO TABS: 5.0000 mg | ORAL_TABLET | Freq: Every day | ORAL | 1 refills | Status: DC

## 2021-09-10 MED ORDER — TAMSULOSIN HCL 0.4 MG PO CAPS: 0.4000 mg | ORAL_CAPSULE | Freq: Every day | ORAL | 1 refills | Status: DC

## 2021-09-10 MED ORDER — APIXABAN 5 MG PO TABS: 5.0000 mg | ORAL_TABLET | Freq: Two times a day (BID) | ORAL | 1 refills | Status: DC

## 2021-09-10 MED ORDER — HYDROCHLOROTHIAZIDE 25 MG PO TABS: 25.0000 mg | ORAL_TABLET | Freq: Every day | ORAL | 1 refills | Status: DC

## 2021-09-10 NOTE — Progress Notes (Signed)
BP 140/82   Pulse 90   Temp (!) 97.5 F (36.4 C)   Wt 166 lb 6.4 oz (75.5 kg)   SpO2 96%   BMI 24.57 kg/m    Subjective:    Patient ID: Ronnie Bullock, male    DOB: [birth year], 75 y.o.   MRN: [id number]  HPI: Ronnie Bullock is a 75 y.o. male  Chief Complaint  Patient presents with   Hypertension   Hyperlipidemia   Edema    Patient states his legs have been swollen    Memory Loss    Patient sister states he is having memory loss.    HYPERTENSION / HYPERLIPIDEMIA Satisfied with current treatment? yes Duration of hypertension: chronic BP monitoring frequency: not checking BP medication side effects: no Past BP meds: amlodipine Duration of hyperlipidemia: chronic Cholesterol medication side effects: no Cholesterol supplements: none Past cholesterol medications: none Medication compliance: excellent compliance Aspirin: no Recent stressors: no Recurrent headaches: no Visual changes: no Palpitations: no Dyspnea: no Chest pain: no Lower extremity edema: no Dizzy/lightheaded: no  Has been having increased issues with his memory and his sister notes that he will wander and not tell them where he is going. They care concerned about him.   Relevant past medical, surgical, family and social history reviewed and updated as indicated. Interim medical history since our last visit reviewed. Allergies and medications reviewed and updated.  Review of Systems  Constitutional: Negative.   Respiratory: Negative.    Cardiovascular: Negative.   Gastrointestinal: Negative.   Musculoskeletal: Negative.   Skin: Negative.   Neurological: Negative.   Psychiatric/Behavioral:  Positive for behavioral problems and confusion. Negative for agitation, decreased concentration, dysphoric mood, hallucinations, self-injury, sleep disturbance and suicidal ideas. The patient is not nervous/anxious and is not hyperactive.    Per HPI unless specifically indicated above     Objective:     BP 140/82   Pulse 90   Temp (!) 97.5 F (36.4 C)   Wt 166 lb 6.4 oz (75.5 kg)   SpO2 96%   BMI 24.57 kg/m   Wt Readings from Last 3 Encounters:  09/10/21 166 lb 6.4 oz (75.5 kg)  08/19/21 170 lb 12.8 oz (77.5 kg)  04/14/21 158 lb (71.7 kg)    Physical Exam Vitals and nursing note reviewed.  Constitutional:      General: He is not in acute distress.    Appearance: Normal appearance. He is not ill-appearing, toxic-appearing or diaphoretic.  HENT:     Head: Normocephalic and atraumatic.     Right Ear: External ear normal.     Left Ear: External ear normal.     Nose: Nose normal.     Mouth/Throat:     Mouth: Mucous membranes are moist.     Pharynx: Oropharynx is clear.  Eyes:     General: No scleral icterus.       Right eye: No discharge.        Left eye: No discharge.     Extraocular Movements: Extraocular movements intact.     Conjunctiva/sclera: Conjunctivae normal.     Pupils: Pupils are equal, round, and reactive to light.  Cardiovascular:     Rate and Rhythm: Normal rate and regular rhythm.     Pulses: Normal pulses.     Heart sounds: Normal heart sounds. No murmur heard.   No friction rub. No gallop.  Pulmonary:     Effort: Pulmonary effort is normal.     Breath sounds: Normal breath sounds.  No stridor. No wheezing, rhonchi or rales.  Chest:     Chest wall: No tenderness.  Musculoskeletal:        General: Normal range of motion.     Cervical back: Normal range of motion and neck supple.  Skin:    General: Skin is warm and dry.     Capillary Refill: Capillary refill takes less than 2 seconds.     Coloration: Skin is not jaundiced or pale.     Findings: No bruising, erythema, lesion or rash.  Neurological:     General: No focal deficit present.     Mental Status: He is alert and oriented to person, place, and time. Mental status is at baseline.  Psychiatric:        Mood and Affect: Mood normal.        Behavior: Behavior normal.        Thought Content:  Thought content normal.        Judgment: Judgment normal.    Results for orders placed or performed in visit on 09/10/21  CBC with Differential/Platelet  Result Value Ref Range   WBC 4.0 3.4 - 10.8 x10E3/uL   RBC 4.99 4.14 - 5.80 x10E6/uL   Hemoglobin 14.7 13.0 - 17.7 g/dL   Hematocrit 44.3 37.5 - 51.0 %   MCV 89 79 - 97 fL   MCH 29.5 26.6 - 33.0 pg   MCHC 33.2 31.5 - 35.7 g/dL   RDW 14.0 11.6 - 15.4 %   Platelets 227 150 - 450 x10E3/uL   Neutrophils 53 Not Estab. %   Lymphs 25 Not Estab. %   Monocytes 13 Not Estab. %   Eos 8 Not Estab. %   Basos 1 Not Estab. %   Neutrophils Absolute 2.2 1.4 - 7.0 x10E3/uL   Lymphocytes Absolute 1.0 0.7 - 3.1 x10E3/uL   Monocytes Absolute 0.5 0.1 - 0.9 x10E3/uL   EOS (ABSOLUTE) 0.3 0.0 - 0.4 x10E3/uL   Basophils Absolute 0.0 0.0 - 0.2 x10E3/uL   Immature Granulocytes 0 Not Estab. %   Immature Grans (Abs) 0.0 0.0 - 0.1 x10E3/uL  Comprehensive metabolic panel  Result Value Ref Range   Glucose 93 70 - 99 mg/dL   BUN 12 8 - 27 mg/dL   Creatinine, Ser 1.14 0.76 - 1.27 mg/dL   eGFR 67 >59 mL/min/1.73   BUN/Creatinine Ratio 11 10 - 24   Sodium 138 134 - 144 mmol/L   Potassium 4.0 3.5 - 5.2 mmol/L   Chloride 101 96 - 106 mmol/L   CO2 24 20 - 29 mmol/L   Calcium 9.3 8.6 - 10.2 mg/dL   Total Protein 7.1 6.0 - 8.5 g/dL   Albumin 3.8 3.7 - 4.7 g/dL   Globulin, Total 3.3 1.5 - 4.5 g/dL   Albumin/Globulin Ratio 1.2 1.2 - 2.2   Bilirubin Total 0.4 0.0 - 1.2 mg/dL   Alkaline Phosphatase 93 44 - 121 IU/L   AST 18 0 - 40 IU/L   ALT 6 0 - 44 IU/L  Lipid Panel w/o Chol/HDL Ratio  Result Value Ref Range   Cholesterol, Total 159 100 - 199 mg/dL   Triglycerides 59 0 - 149 mg/dL   HDL 60 >39 mg/dL   VLDL Cholesterol Cal 12 5 - 40 mg/dL   LDL Chol Calc (NIH) 87 0 - 99 mg/dL      Assessment & Plan:   Problem List Items Addressed This Visit       Cardiovascular and Mediastinum   Essential  hypertension, benign    BP running high and ankles  swelling. Will start HCTZ and recheck 1 month. Call with any concerns.       Relevant Medications   hydrochlorothiazide (HYDRODIURIL) 25 MG tablet   apixaban (ELIQUIS) 5 MG TABS tablet   amLODipine (NORVASC) 5 MG tablet   Other Relevant Orders   CBC with Differential/Platelet (Completed)   Comprehensive metabolic panel (Completed)     Nervous and Auditory   Dementia without behavioral disturbance (HCC) - Primary    MMSE of 14. Will get referral to neurology, start aricept and get CCM team involved to help as much as they can. Continue to monitor. Call with any concerns.       Relevant Medications   donepezil (ARICEPT) 5 MG tablet   Other Relevant Orders   Ambulatory referral to Neurology   AMB Referral to [facility] labs today. Await results. Treat as needed.       Relevant Medications   hydrochlorothiazide (HYDRODIURIL) 25 MG tablet   apixaban (ELIQUIS) 5 MG TABS tablet   amLODipine (NORVASC) 5 MG tablet   Other Relevant Orders   Comprehensive metabolic panel (Completed)   Lipid Panel w/o Chol/HDL Ratio (Completed)   Other Visit Diagnoses     Incomplete bladder emptying       Doing well on his flomax. Refills given. Call with any concerns.    Relevant Medications   tamsulosin (FLOMAX) 0.4 MG CAPS capsule   Need for influenza vaccination       Relevant Orders   Flu Vaccine QUAD High Dose(Fluad) (Completed)        Follow up plan: Return in about 4 weeks (around 10/08/2021).

## 2021-09-11 LAB — LIPID PANEL W/O CHOL/HDL RATIO
Cholesterol, Total: 159 mg/dL (ref 100–199)
HDL: 60 mg/dL (ref >39)
LDL Chol Calc (NIH): 87 mg/dL (ref 0–99)
Triglycerides: 59 mg/dL (ref 0–149)
VLDL Cholesterol Cal: 12 mg/dL (ref 5–40)

## 2021-09-11 LAB — COMPREHENSIVE METABOLIC PANEL
ALT: 6 IU/L (ref 0–44)
AST: 18 IU/L (ref 0–40)
Albumin/Globulin Ratio: 1.2 (ref 1.2–2.2)
Albumin: 3.8 g/dL (ref 3.7–4.7)
Alkaline Phosphatase: 93 IU/L (ref 44–121)
BUN/Creatinine Ratio: 11 (ref 10–24)
BUN: 12 mg/dL (ref 8–27)
Bilirubin Total: 0.4 mg/dL (ref 0.0–1.2)
CO2: 24 mmol/L (ref 20–29)
Calcium: 9.3 mg/dL (ref 8.6–10.2)
Chloride: 101 mmol/L (ref 96–106)
Creatinine, Ser: 1.14 mg/dL (ref 0.76–1.27)
Globulin, Total: 3.3 g/dL (ref 1.5–4.5)
Glucose: 93 mg/dL (ref 70–99)
Potassium: 4 mmol/L (ref 3.5–5.2)
Sodium: 138 mmol/L (ref 134–144)
Total Protein: 7.1 g/dL (ref 6.0–8.5)
eGFR: 67 mL/min/{1.73_m2} (ref >59)

## 2021-09-11 LAB — CBC WITH DIFFERENTIAL/PLATELET
Basophils Absolute: 0 10*3/uL (ref 0.0–0.2)
Basos: 1 %
EOS (ABSOLUTE): 0.3 10*3/uL (ref 0.0–0.4)
Eos: 8 %
Hematocrit: 44.3 % (ref 37.5–51.0)
Hemoglobin: 14.7 g/dL (ref 13.0–17.7)
Immature Grans (Abs): 0 10*3/uL (ref 0.0–0.1)
Immature Granulocytes: 0 %
Lymphocytes Absolute: 1 10*3/uL (ref 0.7–3.1)
Lymphs: 25 %
MCH: 29.5 pg (ref 26.6–33.0)
MCHC: 33.2 g/dL (ref 31.5–35.7)
MCV: 89 fL (ref 79–97)
Monocytes Absolute: 0.5 10*3/uL (ref 0.1–0.9)
Monocytes: 13 %
Neutrophils Absolute: 2.2 10*3/uL (ref 1.4–7.0)
Neutrophils: 53 %
Platelets: 227 10*3/uL (ref 150–450)
RBC: 4.99 x10E6/uL (ref 4.14–5.80)
RDW: 14 % (ref 11.6–15.4)
WBC: 4 10*3/uL (ref 3.4–10.8)

## 2021-09-12 NOTE — Assessment & Plan Note (Signed)
BP running high and ankles swelling. Will start HCTZ and recheck 1 month. Call with any concerns.

## 2021-09-12 NOTE — Assessment & Plan Note (Signed)
Rechecking labs today. Await results. Treat as needed.  °

## 2021-09-12 NOTE — Assessment & Plan Note (Signed)
MMSE of 14. Will get referral to neurology, start aricept and get CCM team involved to help as much as they can. Continue to monitor. Call with any concerns.

## 2021-09-13 ENCOUNTER — Telehealth: Payer: Self-pay

## 2021-09-13 NOTE — Chronic Care Management (AMB) (Signed)
  Chronic Care Management   Note  09/13/2021 Name: Ronnie Bullock MRN: [id number] DOB: September 25, 1946  Ronnie Bullock is a 75 y.o. year old male who is a primary care patient of [name], DO. I reached out to [facility] by phone today in response to a referral sent by Ronnie Bullock PCP.  Ronnie Bullock was given information about Chronic Care Management services today including:  CCM service includes personalized support from designated clinical staff supervised by his physician, including individualized plan of care and coordination with other care providers 24/7 contact phone numbers for assistance for urgent and routine care needs. Service will only be billed when office clinical staff spend 20 minutes or more in a month to coordinate care. Only one practitioner may furnish and bill the service in a calendar month. The patient may stop CCM services at any time (effective at the end of the month) by phone call to the office staff. The patient is responsible for co-pay (up to 20% after annual deductible is met) if co-pay is required by the individual health plan.   Patient's Sister Ronnie Bullock  agreed to services and verbal consent obtained.   Follow up plan: Telephone appointment with care management team member scheduled for: LCSW 09/17/2021 RN CM 09/21/2021  Noreene Larsson, [address], [place], [place] [address] Direct Dial: [phone] [email or web address]: [email or web address]

## 2021-09-15 ENCOUNTER — Ambulatory Visit (INDEPENDENT_AMBULATORY_CARE_PROVIDER_SITE_OTHER): Payer: Medicare HMO

## 2021-09-15 DIAGNOSIS — Y92009 Unspecified place in unspecified non-institutional (private) residence as the place of occurrence of the external cause: Secondary | ICD-10-CM

## 2021-09-15 DIAGNOSIS — I10 Essential (primary) hypertension: Secondary | ICD-10-CM

## 2021-09-15 DIAGNOSIS — W19XXXA Unspecified fall, initial encounter: Secondary | ICD-10-CM

## 2021-09-15 DIAGNOSIS — E7800 Pure hypercholesterolemia, unspecified: Secondary | ICD-10-CM

## 2021-09-15 DIAGNOSIS — F0390 Unspecified dementia without behavioral disturbance: Secondary | ICD-10-CM

## 2021-09-15 NOTE — Patient Instructions (Signed)
Visit Information   PATIENT GOALS/PLAN OF CARE:  Care Plan : RNCM: General Plan of Care (Adult) for Chronic Disease Management and Care Coordination Needs  Updates made by Vanita Ingles, RN since 09/15/2021 12:00 AM     Problem: RNCM; Development of Plan of Care for Chronic Disease Management and Care Coordination Needs   Priority: High     Long-Range Goal: RNCM: Effective Management of Plan of Care for Chronic Disease Management and Care Coordination Needs   Priority: High  Note:   Current Barriers:  Knowledge Deficits related to plan of care for management of HTN, HLD, and Dementia and Fall and Safety concerns  Care Coordination needs related to Mental Health Concerns , Memory Deficits, and caregiver support and education for patient with dementia  Chronic Disease Management support and education needs related to HTN, HLD, and Dementia, and falls and safety concerns  Memory Concerns- Dementia with memory changes   RNCM Clinical Goal(s):  Patient will verbalize understanding of plan for management of HTN, HLD, Dementia, and Falls and Safety Concerns  as evidenced by compliance with the plan of care, compliance with medications, and maintain stable chronic conditions. demonstrate understanding of rationale for each prescribed medication as evidenced by compliance with medications and calling for refills before running out of medications.     attend all scheduled medical appointments: 10-12-2021 at 0920 am as evidenced by keeping appointments and calling the office for reschedule needs. The patient has an upcoming appointment with neurologist but the sister did not have her notebook with that information readily available         continue to work with RN Care Manager and/or Social Worker to address care management and care coordination needs related to HTN, HLD, Dementia, and falls and safety concerns as evidenced by adherence to CM Team Scheduled appointments     work with pharmacist to  address Medication procurement and pill packaging system related to HTN, HLD, and Dementia as evidenced by review of EMR and patient or pharmacist report    work with community resource care guide to address needs related to Mental Health Concerns , Memory Deficits, and care giver resouces  as evidenced by patient and/or community resource care guide support    demonstrate a decrease in HTN, HLD, Dementia, and Falls and safety concern exacerbations  as evidenced by effective management of chronic conditions and working with the CCM team to optimize health and well being  demonstrate ongoing self health care management ability for effective management of chronic conditions as evidenced by  working with the CCM team through collaboration with Consulting civil engineer, provider, and care team.   Interventions: 1:1 collaboration with primary care provider regarding development and update of comprehensive plan of care as evidenced by provider attestation and co-signature Inter-disciplinary care team collaboration (see longitudinal plan of care) Evaluation of current treatment plan related to  self management and patient's adherence to plan as established by provider   SDOH Barriers (Status: New goal.) Long Term Goal  Patient interviewed and SDOH assessment performed        Patient interviewed and appropriate assessments performed Provided patient with information about resources in [facility] and care guides available to assist with changes in SDOH, new concerns or needs  Discussed plans with patient for ongoing care management follow up and provided patient with direct contact information for care management team Advised patient to call the office for Changes in [place], questions, or concerns Provided education to patient/caregiver regarding level of  care options.    Falls:  (Status: New goal.) Long Term Goal  Provided written and verbal education re: potential causes of falls and Fall prevention  strategies Reviewed medications and discussed potential side effects of medications such as dizziness and frequent urination Advised patient of importance of notifying provider of falls Assessed for signs and symptoms of orthostatic hypotension Assessed for falls since last encounter. 09-15-2021: Per the patients sister Arrie Aran the patient walks fast with his head down. The patient had a fall last about 3 to 4 months ago. Was taken to the ED for evaluation. No acute findings. Review of fall precautions and safety concerns. The patient lives with his brother Linward Foster and Parks assist as he can with helping the patient.  Assessed patients knowledge of fall risk prevention secondary to previously provided education Advised patient to discuss new falls, safety concerns, and changes  with provider Screening for signs and symptoms of depression related to chronic disease state Assessed social determinant of health barriers  Dementia  (Status: New goal.) Long Term Goal  Evaluation of current treatment plan related to Dementia, Mental Health Concerns , Memory Deficits, and caregiver support  self-management and patient's adherence to plan as established by provider. 09-15-2021: The patients sister gave updated information concerning the patient. She lives in [place] but is in contact frequently with the patients brother Linward Foster who lives with the patient and assist in his care. The patient has had an episode where he left the home one day around 7 am and was walking and came home about 7 pm. They were getting ready to call authorities when the patient came back home. The patient does not like to drink water, or take a bath and this is a change. The patient also has had an episode where he has urinated on himself and did not realize it. The patient use to draw and play the guitar but these activities he does not like anymore. Extensive education on how memory changes and dementia progress. Offered resources. The patient  does have an upcoming appointment with neurology. Ask the patients sister to write down questions to ask the neurologist. Advised the sister how to reach the [facility] and other team members would be calling to work with the patient and family. Advised the sister to not take things the patient may say as personal and to relay this to her brother [place] as well. The sister is very receptive to ideas to help the patient meet his health and wellness needs. Education material to be provided by my chart and the EMMI system.  Discussed plans with patient for ongoing care management follow up and provided patient with direct contact information for care management team Advised patient to call the office for changes in mood, anxiety, depression, or acute memory changes ; Provided education to patient re: The book: The 36-hour day a resources to help caregivers with patients who have dementia.  Also discussed changes to look for as progression of dementia; Reviewed medications with patient and discussed compliance with medications. The patient has been taking aricept but the patients sister states they have not seen changes present ; Social Work referral for ongoing support and education for patient with dementia, resouces, and care giver support; Pharmacy referral for the patients sister interested in the pill packaging system so the patient can remain as independent as possible with taking medications ; Discussed plans with patient for ongoing care management follow up and provided patient with direct contact information for care management team;  Advised patient to discuss changes in memory, activites the patient is doing, and write down questions to discuss with provider; Screening for signs and symptoms of depression related to chronic disease state;  Assessed social determinant of health barriers;   Hyperlipidemia:  (Status: New goal.) Long Term Goal  Lab Results  Component Value Date   CHOL 159 09/10/2021    HDL 60 09/10/2021   LDLCALC 87 09/10/2021   TRIG 59 09/10/2021     Medication review performed; medication list updated in electronic medical record.  Provider established cholesterol goals reviewed; Counseled on importance of regular laboratory monitoring as prescribed; Provided HLD educational materials; Reviewed role and benefits of statin for ASCVD risk reduction; Reviewed importance of limiting foods high in cholesterol; Reviewed exercise goals and target of 150 minutes per week;  Hypertension: (Status: New goal.) Last practice recorded BP readings:  BP Readings from Last 3 Encounters:  09/10/21 140/82  08/19/21 (!) 160/100  04/14/21 127/84  Most recent eGFR/CrCl:  Lab Results  Component Value Date   EGFR 67 09/10/2021    No components found for: CRCL  Evaluation of current treatment plan related to hypertension self management and patient's adherence to plan as established by provider;   Provided education to patient re: stroke prevention, s/s of heart attack and stroke; Reviewed prescribed diet heart healthy diet and review of not adding sodium to food due to the patient with swelling noted in feet  Reviewed medications with patient and discussed importance of compliance;  Counseled on adverse effects of illicit drug and excessive alcohol use in patients with high blood pressure;  Discussed plans with patient for ongoing care management follow up and provided patient with direct contact information for care management team; Advised patient, providing education and rationale, to monitor blood pressure daily and record, calling PCP for findings outside established parameters;  Advised patient to discuss blood pressure trends  with provider; Provided education on prescribed diet heart healthy diet through [place] system ;  Discussed complications of poorly controlled blood pressure such as heart disease, stroke, circulatory complications, vision complications, kidney impairment,  sexual dysfunction;   Patient Goals/Self-Care Activities: Patient will self administer medications as prescribed as evidenced by self report/primary caregiver report  Patient will attend all scheduled provider appointments as evidenced by clinician review of documented attendance to scheduled appointments and patient/caregiver report Patient will call pharmacy for medication refills as evidenced by patient report and review of pharmacy fill history as appropriate Patient will attend church or other social activities as evidenced by patient report Patient will call provider office for new concerns or questions as evidenced by review of documented incoming telephone call notes and patient report Patient will work with BSW to address care coordination needs and will continue to work with the clinical team to address health care and disease management related needs as evidenced by documented adherence to scheduled care management/care coordination appointments - check blood pressure weekly - choose a place to take my blood pressure (home, clinic or office, retail store) - write blood pressure results in a log or diary - learn about high blood pressure - keep a blood pressure log - take blood pressure log to all doctor appointments - call doctor for signs and symptoms of high blood pressure - develop an action plan for high blood pressure - keep all doctor appointments - take medications for blood pressure exactly as prescribed - report new symptoms to your doctor - eat more whole grains, fruits and vegetables, lean meats  and healthy fats - call for medicine refill 2 or 3 days before it runs out - take all medications exactly as prescribed - call doctor with any symptoms you believe are related to your medicine - call doctor when you experience any new symptoms - go to all doctor appointments as scheduled - adhere to prescribed diet: Heart Healthy       Consent to CCM Services: Mr.  Granlund was given information about Chronic Care Management services including:  CCM service includes personalized support from designated clinical staff supervised by his physician, including individualized plan of care and coordination with other care providers 24/7 contact phone numbers for assistance for urgent and routine care needs. Service will only be billed when office clinical staff spend 20 minutes or more in a month to coordinate care. Only one practitioner may furnish and bill the service in a calendar month. The patient may stop CCM services at any time (effective at the end of the month) by phone call to the office staff. The patient will be responsible for cost sharing (co-pay) of up to 20% of the service fee (after annual deductible is met).  Patient agreed to services and verbal consent obtained.   Patient verbalizes understanding of instructions provided today and agrees to view in [place].   Face to Face appointment with care management team member scheduled for: 10-12-2021 at 0900 am  Noreene Larsson RN, MSN, [facility] Mobile: [phone]  Dementia Caregiver Guide Dementia is a term used to describe a number of symptoms that affect memory and thinking. The most common symptoms include: Memory loss. Trouble with language and communication. Trouble concentrating. Poor judgment and problems with reasoning. Wandering from home or public places. Extreme anxiety or depression. Being suspicious or having angry outbursts and accusations. Child-like behavior and language. Dementia can be frightening and confusing. And taking care of someone with dementia can be challenging. This guide provides tips to help you when providing care for a person with dementia. How to help manage lifestyle changes Dementia usually gets worse slowly over time. In the early stages, people with dementia can stay  independent and safe with some help. In later stages, they need help with daily tasks such as dressing, grooming, and using the bathroom. There are actions you can take to help a person manage his or her life while living with this condition. Communicating When the person is talking or seems frustrated, make eye contact and hold the person's hand. Ask specific questions that need yes or no answers. Use simple words, short sentences, and a calm voice. Only give one direction at a time. When offering choices, limit the person to just one or two. Avoid correcting the person in a negative way. If the person is struggling to find the right words, gently try to help him or her. Preventing injury  Keep floors clear of clutter. Remove rugs, magazine racks, and floor lamps. Keep hallways well lit, especially at night. Put a handrail and nonslip mat in the bathtub or shower. Put childproof locks on cabinets that contain dangerous items, such as medicines, alcohol, guns, toxic cleaning items, sharp tools or utensils, matches, and lighters. For doors to the outside of the house, put the locks in places where the person cannot see or reach them easily. This will help ensure that the person does not wander out of the house and get lost. Be prepared for emergencies. Keep a list of emergency  phone numbers and addresses in a convenient area. Remove car keys and lock garage doors so that the person does not try to get in the car and drive. Have the person wear a bracelet that tracks locations and identifies the person as having memory problems. This should be worn at all times for safety. Helping with daily life  Keep the person on track with his or her routine. Try to identify areas where the person may need help. Be supportive, patient, calm, and encouraging. Gently remind the person that adjusting to changes takes time. Help with the tasks that the person has asked for help with. Keep the person involved  in daily tasks and decisions as much as possible. Encourage conversation, but try not to get frustrated if the person struggles to find words or does not seem to appreciate your help. How to recognize stress Look for signs of stress in yourself and in the person you are caring for. If you notice signs of stress, take steps to manage it. Symptoms of stress include: Feeling anxious, irritable, frustrated, or angry. Denying that the person has dementia or that his or her symptoms will not improve. Feeling depressed, hopeless, or unappreciated. Difficulty sleeping. Difficulty concentrating. Developing stress-related health problems. Feeling like you have too little time for your own life. Follow these instructions at home: Take care of your health Make sure that you and the person you are caring for: Get regular sleep. Exercise regularly. Eat regular, nutritious meals. Take over-the-counter and prescription medicines only as told by your health care providers. Drink enough fluid to keep your urine pale yellow. Attend all scheduled health care appointments.  General instructions Join a support group with others who are caregivers. Ask about respite care resources. Respite care can provide short-term care for the person so that you can have a regular break from the stress of caregiving. Consider any safety risks and take steps to avoid them. Organize medicines in a pill box for each day of the week. Create a plan to handle any legal or financial matters. Get legal or financial advice if needed. Keep a calendar in a central location to remind the person of appointments or other activities. Where to find support: Many individuals and organizations offer support. These include: Support groups for people with dementia. Support groups for caregivers. Counselors or therapists. Home health care services. Adult day care centers. Where to find more information Centers for Disease Control and  Prevention: http://www.wolf.info/ Alzheimer's Association: [place].[employer] Family Caregiver Alliance: www.caregiver.[place]: www.alzfdn.org Contact a health care provider if: The person's health is rapidly getting worse. You are no longer able to care for the person. Caring for the person is affecting your physical and emotional health. You are feeling depressed or anxious about caring for the person. Get help right away if: The person threatens himself or herself, you, or anyone else. You feel depressed or sad, or feel that you want to harm yourself. If you ever feel like your loved one may hurt himself or herself or others, or if he or she shares thoughts about taking his or her own life, get help right away. You can go to your nearest emergency department or: Call your local emergency services (911 in the [place] Call a suicide crisis helpline, such as the [place] at (210)548-8727 or 988 in the [place]. This is open 24 hours a day in the [place] Text the Crisis Text Line at (678) 625-0147 (in the [place].). Summary Dementia is  a term used to describe a number of symptoms that affect memory and thinking. Dementia usually gets worse slowly over time. Take steps to reduce the person's risk of injury and to plan for future care. Caregivers need support, relief from caregiving, and time for their own lives. This information is not intended to replace advice given to you by your health care provider. Make sure you discuss any questions you have with your health care provider. Document Revised: 05/19/2021 Document Reviewed: 03/09/2020 Elsevier Patient Education  2022 [place]. Edema Edema is an abnormal buildup of fluids in the body tissues and under the skin. Swelling of the legs, feet, and ankles is a common symptom that becomes more likely as you get older. Swelling is also common in looser tissues, like around the eyes. When the affected area is squeezed, the  fluid may move out of that spot and leave a dent for a few moments. This dent is called pitting edema. There are many possible causes of edema. Eating too much salt (sodium) and being on your feet or sitting for a long time can cause edema in your legs, feet, and ankles. Hot weather may make edema worse. Common causes of edema include: Heart failure. Liver or kidney disease. Weak leg blood vessels. Cancer. An injury. Pregnancy. Medicines. Being obese. Low protein levels in the blood. Edema is usually painless. Your skin may look swollen or shiny. Follow these instructions at home: Keep the affected body part raised (elevated) above the level of your heart when you are sitting or lying down. Do not sit still or stand for long periods of time. Do not wear tight clothing. Do not wear garters on your upper legs. Exercise your legs to get your circulation going. This helps to move the fluid back into your blood vessels, and it may help the swelling go down. Wear elastic bandages or support stockings to reduce swelling as told by your health care provider. Eat a low-salt (low-sodium) diet to reduce fluid as told by your health care provider. Depending on the cause of your swelling, you may need to limit how much fluid you drink (fluid restriction). Take over-the-counter and prescription medicines only as told by your health care provider. Contact a health care provider if: Your edema does not get better with treatment. You have heart, liver, or kidney disease and have symptoms of edema. You have sudden and unexplained weight gain. Get help right away if: You develop shortness of breath or chest pain. You cannot breathe when you lie down. You develop pain, redness, or warmth in the swollen areas. You have heart, liver, or kidney disease and suddenly get edema. You have a fever and your symptoms suddenly get worse. Summary Edema is an abnormal buildup of fluids in the body tissues and under  the skin. Eating too much salt (sodium) and being on your feet or sitting for a long time can cause edema in your legs, feet, and ankles. Keep the affected body part raised (elevated) above the level of your heart when you are sitting or lying down. This information is not intended to replace advice given to you by your health care provider. Make sure you discuss any questions you have with your health care provider. Document Revised: 03/25/2021 Document Reviewed: 08/18/2020 Elsevier Patient Education  2022 [place] Eating Plan DASH stands for Dietary Approaches to Stop Hypertension. The DASH eating plan is a healthy eating plan that has been shown to: Reduce high blood pressure (hypertension). Reduce  your risk for type 2 diabetes, heart disease, and stroke. Help with weight loss. What are tips for following this plan? Reading food labels Check food labels for the amount of salt (sodium) per serving. Choose foods with less than 5 percent of the Daily Value of sodium. Generally, foods with less than 300 milligrams (mg) of sodium per serving fit into this eating plan. To find whole grains, look for the word "whole" as the first word in the ingredient list. Shopping Buy products labeled as "low-sodium" or "no salt added." Buy fresh foods. Avoid canned foods and pre-made or frozen meals. Cooking Avoid adding salt when cooking. Use salt-free seasonings or herbs instead of table salt or sea salt. Check with your health care provider or pharmacist before using salt substitutes. Do not fry foods. Cook foods using healthy methods such as baking, boiling, grilling, roasting, and broiling instead. Cook with heart-healthy oils, such as olive, canola, avocado, soybean, or sunflower oil. Meal planning  Eat a balanced diet that includes: 4 or more servings of fruits and 4 or more servings of vegetables each day. Try to fill one-half of your plate with fruits and vegetables. 6-8 servings of  whole grains each day. Less than 6 oz (170 g) of lean meat, poultry, or fish each day. A 3-oz (85-g) serving of meat is about the same size as a deck of cards. One egg equals 1 oz (28 g). 2-3 servings of low-fat dairy each day. One serving is 1 cup (237 mL). 1 serving of nuts, seeds, or beans 5 times each week. 2-3 servings of heart-healthy fats. Healthy fats called omega-3 fatty acids are found in foods such as walnuts, flaxseeds, fortified milks, and eggs. These fats are also found in cold-water fish, such as sardines, salmon, and mackerel. Limit how much you eat of: Canned or prepackaged foods. Food that is high in trans fat, such as some fried foods. Food that is high in saturated fat, such as fatty meat. Desserts and other sweets, sugary drinks, and other foods with added sugar. Full-fat dairy products. Do not salt foods before eating. Do not eat more than 4 egg yolks a week. Try to eat at least 2 vegetarian meals a week. Eat more home-cooked food and less restaurant, buffet, and fast food. Lifestyle When eating at a restaurant, ask that your food be prepared with less salt or no salt, if possible. If you drink alcohol: Limit how much you use to: 0-1 drink a day for women who are not pregnant. 0-2 drinks a day for men. Be aware of how much alcohol is in your drink. In the [place] one drink equals one 12 oz bottle of beer (355 mL), one 5 oz glass of wine (148 mL), or one 1 oz glass of hard liquor (44 mL). General information Avoid eating more than 2,300 mg of salt a day. If you have hypertension, you may need to reduce your sodium intake to 1,500 mg a day. Work with your health care provider to maintain a healthy body weight or to lose weight. Ask what an ideal weight is for you. Get at least 30 minutes of exercise that causes your heart to beat faster (aerobic exercise) most days of the week. Activities may include walking, swimming, or biking. Work with your health care provider or  dietitian to adjust your eating plan to your individual calorie needs. What foods should I eat? Fruits All fresh, dried, or frozen fruit. Canned fruit in natural juice (without added sugar). Vegetables  Fresh or frozen vegetables (raw, steamed, roasted, or grilled). Low-sodium or reduced-sodium tomato and vegetable juice. Low-sodium or reduced-sodium tomato sauce and tomato paste. Low-sodium or reduced-sodium canned vegetables. Grains Whole-grain or whole-wheat bread. Whole-grain or whole-wheat pasta. Brown rice. [place]. Bulgur. Whole-grain and low-sodium cereals. Pita bread. Low-fat, low-sodium crackers. Whole-wheat flour tortillas. Meats and other proteins Skinless chicken or [place]. Ground chicken or [place]. Pork with fat trimmed off. Fish and seafood. Egg whites. Dried beans, peas, or lentils. Unsalted nuts, nut butters, and seeds. Unsalted canned beans. Lean cuts of beef with fat trimmed off. Low-sodium, lean precooked or cured meat, such as sausages or meat loaves. Dairy Low-fat (1%) or fat-free (skim) milk. Reduced-fat, low-fat, or fat-free cheeses. Nonfat, low-sodium ricotta or cottage cheese. Low-fat or nonfat yogurt. Low-fat, low-sodium cheese. Fats and oils Soft margarine without trans fats. Vegetable oil. Reduced-fat, low-fat, or light mayonnaise and salad dressings (reduced-sodium). Canola, safflower, olive, avocado, soybean, and sunflower oils. Avocado. Seasonings and condiments Herbs. Spices. Seasoning mixes without salt. Other foods Unsalted popcorn and pretzels. Fat-free sweets. The items listed above may not be a complete list of foods and beverages you can eat. Contact a dietitian for more information. What foods should I avoid? Fruits Canned fruit in a light or heavy syrup. Fried fruit. Fruit in cream or butter sauce. Vegetables Creamed or fried vegetables. Vegetables in a cheese sauce. Regular canned vegetables (not low-sodium or reduced-sodium). Regular canned  tomato sauce and paste (not low-sodium or reduced-sodium). Regular tomato and vegetable juice (not low-sodium or reduced-sodium). Angie Fava. Olives. Grains Baked goods made with fat, such as croissants, muffins, or some breads. Dry pasta or rice meal packs. Meats and other proteins Fatty cuts of meat. Ribs. Fried meat. Berniece Salines. Bologna, salami, and other precooked or cured meats, such as sausages or meat loaves. Fat from the back of a pig (fatback). Bratwurst. Salted nuts and seeds. Canned beans with added salt. Canned or smoked fish. Whole eggs or egg yolks. Chicken or [place] with skin. Dairy Whole or 2% milk, cream, and half-and-half. Whole or full-fat cream cheese. Whole-fat or sweetened yogurt. Full-fat cheese. Nondairy creamers. Whipped toppings. Processed cheese and cheese spreads. Fats and oils Butter. Stick margarine. Lard. Shortening. Ghee. Bacon fat. Tropical oils, such as coconut, palm kernel, or palm oil. Seasonings and condiments Onion salt, garlic salt, seasoned salt, table salt, and sea salt. Worcestershire sauce. Tartar sauce. Barbecue sauce. Teriyaki sauce. Soy sauce, including reduced-sodium. Steak sauce. Canned and packaged gravies. Fish sauce. Oyster sauce. Cocktail sauce. Store-bought horseradish. Ketchup. Mustard. Meat flavorings and tenderizers. Bouillon cubes. Hot sauces. Pre-made or packaged marinades. Pre-made or packaged taco seasonings. Relishes. Regular salad dressings. Other foods Salted popcorn and pretzels. The items listed above may not be a complete list of foods and beverages you should avoid. Contact a dietitian for more information. Where to find more information National Heart, Lung, and Blood Institute: https://wilson-eaton.com/ American Heart Association: www.heart.org Academy of Nutrition and Dietetics: www.eatright.[place]: www.kidney.org Summary The DASH eating plan is a healthy eating plan that has been shown to reduce high blood pressure  (hypertension). It may also reduce your risk for type 2 diabetes, heart disease, and stroke. When on the DASH eating plan, aim to eat more fresh fruits and vegetables, whole grains, lean proteins, low-fat dairy, and heart-healthy fats. With the DASH eating plan, you should limit salt (sodium) intake to 2,300 mg a day. If you have hypertension, you may need to reduce your sodium intake to 1,500 mg a day.  Work with your health care provider or dietitian to adjust your eating plan to your individual calorie needs. This information is not intended to replace advice given to you by your health care provider. Make sure you discuss any questions you have with your health care provider. Document Revised: 09/27/2019 Document Reviewed: 09/27/2019 Elsevier Patient Education  2022 [employer].

## 2021-09-15 NOTE — Chronic Care Management (AMB) (Signed)
Chronic Care Management   CCM RN Visit Note  09/15/2021 Name: Ronnie Bullock MRN: [id number] DOB: [birth year]  Subjective: Ronnie Bullock is a 75 y.o. year old male who is a primary care patient of [name], DO. The care management team was consulted for assistance with disease management and care coordination needs.    Engaged with patient by telephone for initial visit in response to provider referral for case management and/or care coordination services. Spoke with the patients sister, [name].   Consent to Services:  The patient was given the following information about Chronic Care Management services today, agreed to services, and gave verbal consent: 1. CCM service includes personalized support from designated clinical staff supervised by the primary care provider, including individualized plan of care and coordination with other care providers 2. 24/7 contact phone numbers for assistance for urgent and routine care needs. 3. Service will only be billed when office clinical staff spend 20 minutes or more in a month to coordinate care. 4. Only one practitioner may furnish and bill the service in a calendar month. 5.The patient may stop CCM services at any time (effective at the end of the month) by phone call to the office staff. 6. The patient will be responsible for cost sharing (co-pay) of up to 20% of the service fee (after annual deductible is met). Patient agreed to services and consent obtained.  Patient agreed to services and verbal consent obtained.   Assessment: Review of patient past medical history, allergies, medications, health status, including review of consultants reports, laboratory and other test data, was performed as part of comprehensive evaluation and provision of chronic care management services.   SDOH (Social Determinants of Health) assessments and interventions performed:    CCM Care Plan  No Known Allergies  Outpatient Encounter Medications  as of 09/15/2021  Medication Sig   amLODipine (NORVASC) 5 MG tablet Take 1 tablet (5 mg total) by mouth daily.   apixaban (ELIQUIS) 5 MG TABS tablet Take 1 tablet (5 mg total) by mouth 2 (two) times daily.   donepezil (ARICEPT) 5 MG tablet Take 1 tablet (5 mg total) by mouth at bedtime.   hydrochlorothiazide (HYDRODIURIL) 25 MG tablet Take 1 tablet (25 mg total) by mouth daily.   Multiple Vitamin (MULTIVITAMIN WITH MINERALS) TABS tablet Take 1 tablet by mouth daily.   tamsulosin (FLOMAX) 0.4 MG CAPS capsule Take 1 capsule (0.4 mg total) by mouth daily.   No facility-administered encounter medications on file as of 09/15/2021.    Patient Active Problem List   Diagnosis Date Noted   CAD (coronary artery disease) 08/24/2021   Demand ischemia ([place]) 02/12/2021   Fall at home, initial encounter 02/12/2021   Scalp hematoma, initial encounter 02/12/2021   Dementia without behavioral disturbance ([place]) 01/21/2021   Sinus tachycardia 01/21/2021   DVT (deep venous thrombosis) ([place]) 01/20/2021   Scrotal swelling 11/03/2020   Dizziness 11/03/2020   Dark stools 09/23/2020   Prostate cancer ([place]) 07/03/2019   Elevated PSA 03/28/2019   Encysted hydrocele 03/28/2019   Benign neoplasm of ascending colon    Benign neoplasm of cecum    Hypercholesteremia 05/02/2018   Advanced care planning/counseling discussion 11/10/2017   Essential hypertension, benign 10/10/2017    Conditions to be addressed/monitored:HTN, HLD, Dementia, Falls and Safety Concerns and Guardianship  Care Plan : RNCM: General Plan of Care (Adult) for Chronic Disease Management and Care Coordination Needs  Updates made by Vanita Ingles, RN since 09/15/2021 12:00 AM  Problem: RNCM; Development of Plan of Care for Chronic Disease Management and Care Coordination Needs   Priority: High     Long-Range Goal: RNCM: Effective Management of Plan of Care for Chronic Disease Management and Care Coordination Needs   Priority: High   Note:   Current Barriers:  Knowledge Deficits related to plan of care for management of HTN, HLD, and Dementia and Fall and Safety concerns  Care Coordination needs related to Mental Health Concerns , Memory Deficits, and caregiver support and education for patient with dementia  Chronic Disease Management support and education needs related to HTN, HLD, and Dementia, and falls and safety concerns  Memory Concerns- Dementia with memory changes   RNCM Clinical Goal(s):  Patient will verbalize understanding of plan for management of HTN, HLD, Dementia, and Falls and Safety Concerns  as evidenced by compliance with the plan of care, compliance with medications, and maintain stable chronic conditions. demonstrate understanding of rationale for each prescribed medication as evidenced by compliance with medications and calling for refills before running out of medications.     attend all scheduled medical appointments: 10-12-2021 at 0920 am as evidenced by keeping appointments and calling the office for reschedule needs. The patient has an upcoming appointment with neurologist but the sister did not have her notebook with that information readily available         continue to work with RN Care Manager and/or Social Worker to address care management and care coordination needs related to HTN, HLD, Dementia, and falls and safety concerns as evidenced by adherence to CM Team Scheduled appointments     work with pharmacist to address Medication procurement and pill packaging system related to HTN, HLD, and Dementia as evidenced by review of EMR and patient or pharmacist report    work with community resource care guide to address needs related to Mental Health Concerns , Memory Deficits, and care giver resouces  as evidenced by patient and/or community resource care guide support    demonstrate a decrease in HTN, HLD, Dementia, and Falls and safety concern exacerbations  as evidenced by effective management of  chronic conditions and working with the CCM team to optimize health and well being  demonstrate ongoing self health care management ability for effective management of chronic conditions as evidenced by  working with the CCM team through collaboration with Consulting civil engineer, provider, and care team.   Interventions: 1:1 collaboration with primary care provider regarding development and update of comprehensive plan of care as evidenced by provider attestation and co-signature Inter-disciplinary care team collaboration (see longitudinal plan of care) Evaluation of current treatment plan related to  self management and patient's adherence to plan as established by provider   SDOH Barriers (Status: New goal.) Long Term Goal  Patient interviewed and SDOH assessment performed        Patient interviewed and appropriate assessments performed Provided patient with information about resources in [facility] and care guides available to assist with changes in SDOH, new concerns or needs  Discussed plans with patient for ongoing care management follow up and provided patient with direct contact information for care management team Advised patient to call the office for Changes in [place], questions, or concerns Provided education to patient/caregiver regarding level of care options.    Falls:  (Status: New goal.) Long Term Goal  Provided written and verbal education re: potential causes of falls and Fall prevention strategies Reviewed medications and discussed potential side effects of medications such as dizziness and  frequent urination Advised patient of importance of notifying provider of falls Assessed for signs and symptoms of orthostatic hypotension Assessed for falls since last encounter. 09-15-2021: Per the patients sister Arrie Aran the patient walks fast with his head down. The patient had a fall last about 3 to 4 months ago. Was taken to the ED for evaluation. No acute findings. Review of fall  precautions and safety concerns. The patient lives with his brother Linward Foster and [place] assist as he can with helping the patient.  Assessed patients knowledge of fall risk prevention secondary to previously provided education Advised patient to discuss new falls, safety concerns, and changes  with provider Screening for signs and symptoms of depression related to chronic disease state Assessed social determinant of health barriers  Dementia  (Status: New goal.) Long Term Goal  Evaluation of current treatment plan related to Dementia, Mental Health Concerns , Memory Deficits, and caregiver support  self-management and patient's adherence to plan as established by provider. 09-15-2021: The patients sister gave updated information concerning the patient. She lives in [place] but is in contact frequently with the patients brother Linward Foster who lives with the patient and assist in his care. The patient has had an episode where he left the home one day around 7 am and was walking and came home about 7 pm. They were getting ready to call authorities when the patient came back home. The patient does not like to drink water, or take a bath and this is a change. The patient also has had an episode where he has urinated on himself and did not realize it. The patient use to draw and play the guitar but these activities he does not like anymore. Extensive education on how memory changes and dementia progress. Offered resources. The patient does have an upcoming appointment with neurology. Ask the patients sister to write down questions to ask the neurologist. Advised the sister how to reach the [facility] and other team members would be calling to work with the patient and family. Advised the sister to not take things the patient may say as personal and to relay this to her brother Philo as well. The sister is very receptive to ideas to help the patient meet his health and wellness needs. Education material to be provided by my  chart and the EMMI system.  Discussed plans with patient for ongoing care management follow up and provided patient with direct contact information for care management team Advised patient to call the office for changes in mood, anxiety, depression, or acute memory changes ; Provided education to patient re: The book: The 36-hour day a resources to help caregivers with patients who have dementia.  Also discussed changes to look for as progression of dementia; Reviewed medications with patient and discussed compliance with medications. The patient has been taking aricept but the patients sister states they have not seen changes present ; Social Work referral for ongoing support and education for patient with dementia, resouces, and care giver support; Pharmacy referral for the patients sister interested in the pill packaging system so the patient can remain as independent as possible with taking medications ; Discussed plans with patient for ongoing care management follow up and provided patient with direct contact information for care management team; Advised patient to discuss changes in memory, activites the patient is doing, and write down questions to discuss with provider; Screening for signs and symptoms of depression related to chronic disease state;  Assessed social determinant of health barriers;  Hyperlipidemia:  (Status: New goal.) Long Term Goal  Lab Results  Component Value Date   CHOL 159 09/10/2021   HDL 60 09/10/2021   LDLCALC 87 09/10/2021   TRIG 59 09/10/2021     Medication review performed; medication list updated in electronic medical record.  Provider established cholesterol goals reviewed; Counseled on importance of regular laboratory monitoring as prescribed; Provided HLD educational materials; Reviewed role and benefits of statin for ASCVD risk reduction; Reviewed importance of limiting foods high in cholesterol; Reviewed exercise goals and target of 150 minutes  per week;  Hypertension: (Status: New goal.) Last practice recorded BP readings:  BP Readings from Last 3 Encounters:  09/10/21 140/82  08/19/21 (!) 160/100  04/14/21 127/84  Most recent eGFR/CrCl:  Lab Results  Component Value Date   EGFR 67 09/10/2021    No components found for: CRCL  Evaluation of current treatment plan related to hypertension self management and patient's adherence to plan as established by provider;   Provided education to patient re: stroke prevention, s/s of heart attack and stroke; Reviewed prescribed diet heart healthy diet and review of not adding sodium to food due to the patient with swelling noted in feet  Reviewed medications with patient and discussed importance of compliance;  Counseled on adverse effects of illicit drug and excessive alcohol use in patients with high blood pressure;  Discussed plans with patient for ongoing care management follow up and provided patient with direct contact information for care management team; Advised patient, providing education and rationale, to monitor blood pressure daily and record, calling PCP for findings outside established parameters;  Advised patient to discuss blood pressure trends  with provider; Provided education on prescribed diet heart healthy diet through [place] system ;  Discussed complications of poorly controlled blood pressure such as heart disease, stroke, circulatory complications, vision complications, kidney impairment, sexual dysfunction;   Patient Goals/Self-Care Activities: Patient will self administer medications as prescribed as evidenced by self report/primary caregiver report  Patient will attend all scheduled provider appointments as evidenced by clinician review of documented attendance to scheduled appointments and patient/caregiver report Patient will call pharmacy for medication refills as evidenced by patient report and review of pharmacy fill history as appropriate Patient will  attend church or other social activities as evidenced by patient report Patient will call provider office for new concerns or questions as evidenced by review of documented incoming telephone call notes and patient report Patient will work with BSW to address care coordination needs and will continue to work with the clinical team to address health care and disease management related needs as evidenced by documented adherence to scheduled care management/care coordination appointments - check blood pressure weekly - choose a place to take my blood pressure (home, clinic or office, retail store) - write blood pressure results in a log or diary - learn about high blood pressure - keep a blood pressure log - take blood pressure log to all doctor appointments - call doctor for signs and symptoms of high blood pressure - develop an action plan for high blood pressure - keep all doctor appointments - take medications for blood pressure exactly as prescribed - report new symptoms to your doctor - eat more whole grains, fruits and vegetables, lean meats and healthy fats - call for medicine refill 2 or 3 days before it runs out - take all medications exactly as prescribed - call doctor with any symptoms you believe are related to your medicine - call doctor when you  experience any new symptoms - go to all doctor appointments as scheduled - adhere to prescribed diet: Heart Healthy       Plan:Face to Face appointment with care management team member scheduled for: 10-12-2021 at 0900 am  Noreene Larsson RN, MSN, [facility] Mobile: [phone]

## 2021-09-16 ENCOUNTER — Telehealth: Payer: Self-pay

## 2021-09-17 ENCOUNTER — Ambulatory Visit: Payer: Medicare HMO | Admitting: Licensed Clinical Social Worker

## 2021-09-17 DIAGNOSIS — F0390 Unspecified dementia without behavioral disturbance: Secondary | ICD-10-CM

## 2021-09-17 DIAGNOSIS — I10 Essential (primary) hypertension: Secondary | ICD-10-CM

## 2021-09-20 NOTE — Chronic Care Management (AMB) (Signed)
  Chronic Care Management   Note  09/20/2021 Name: BARRIE WALE MRN: [id number] DOB: October 13, 1946  [name] is a 75 y.o. year old male who is a primary care patient of [name], DO. MANLY NESTLE is currently enrolled in care management services. An additional referral for Pharm D  was placed.   Follow up plan: Telephone appointment with care management team member scheduled for: 10/25/2021  Noreene Larsson, [address], [place], [place] [address] Direct Dial: [phone] Page [email or web address]: [email or web address]

## 2021-09-20 NOTE — Patient Instructions (Signed)
Visit Information  PATIENT GOALS/PLAN OF CARE:  Care Plan : LCSW Plan of Care  Updates made by Ronnie Chesterfield, LCSW since 09/20/2021 12:00 AM     Problem: Quality of Life (General Plan of Care)      Goal: Quality of Life Maintained   Start Date: 09/17/2021  This Visit's Progress: On track  Priority: Medium  Note:   Current barriers:   Severe Persistent Mental Health needs related to Dementia Level of care concerns, Memory Deficits, Inability to perform ADL's independently, and Inability to perform IADL's independently Needs Support, Education, and Care Coordination in order to meet unmet mental health needs. Clinical Goal(s): verbalize basic understanding of Dementia disease process and self health management plan   Clinical Interventions:  Assessed patient's previous and current treatment, coping skills, support system and barriers to care  All hx provided by patient's guardian and sister, Ronnie Bullock Family are interested in resources to keep patient occupied. States patient values independence and has wandered without informing them of his whereabouts  Patient can be irritable with brother occasionally, who provides in-home support. CCM LCSW discussed strategies to assist family with prioritizing patient's safety and providing pt opportunity to complete tasks (visual reminders, creating a routine, etc) independently. Family plan to read 36 hour day Patient enjoys attending church weekly and is compliant with medications with family support CCM LCSW discussed local resources for dementia/caregiver support, including respite care. Per request, LCSW will mail information to [place], [place] [id number] Active listening / Reflection utilized  Emotional Support Provided Provided psychoeducation for mental health needs  Caregiver stress acknowledged  Verbalization of feelings encouraged  ; Review various resources, discussed options and provided patient information about   Dementia resources and support  1:1 collaboration with primary care provider regarding development and update of comprehensive plan of care as evidenced by provider attestation and co-signature Inter-disciplinary care team collaboration (Bullock longitudinal plan of care) Patient Goals/Self-Care Activities: Over the next 120 days Attend appointments with providers and contact PCP office with any questions or concerns Utilize strategies discussed to assist with caregiver stress Review supportive information regarding Dementia support services        Ronnie Bullock was given information about Care Management services by the embedded care coordination team including:  Care Management services include personalized support from designated clinical staff supervised by his physician, including individualized plan of care and coordination with other care providers 24/7 contact phone numbers for assistance for urgent and routine care needs. The patient may stop CCM services at any time (effective at the end of the month) by phone call to the office staff.  Patient agreed to services and verbal consent obtained.   Patient verbalizes understanding of instructions provided today and agrees to view in [place].   Telephone follow up appointment with care management team member scheduled for:10/15/21  Ronnie Bullock, MSW, [email or web address] Phone [phone] 8:56 AM

## 2021-09-20 NOTE — Chronic Care Management (AMB) (Signed)
Chronic Bullock Management    Clinical Social Work Note  09/20/2021 Name: Ronnie Bullock MRN: [id number] DOB: 1946/11/06  Ronnie Bullock is a 75 y.o. year old male who is a primary Bullock patient of Ronnie Roys, DO. The CCM team was consulted to assist the patient with chronic disease management and/or Bullock coordination needs related to: Mental Health Counseling and Resources and Caregiver Stress.   Engaged with patient's sister, Ronnie Bullock, by telephone for initial visit in response to provider referral for social work chronic Bullock management and Bullock coordination services.   Consent to Services:  The patient was given the following information about Chronic Bullock Management services today, agreed to services, and gave verbal consent: 1. CCM service includes personalized support from designated clinical staff supervised by the primary Bullock provider, including individualized plan of Bullock and coordination with other Bullock providers 2. 24/7 contact phone numbers for assistance for urgent and routine Bullock needs. 3. Service will only be billed when office clinical staff spend 20 minutes or more in a month to coordinate Bullock. 4. Only one practitioner may furnish and bill the service in a calendar month. 5.The patient may stop CCM services at any time (effective at the end of the month) by phone call to the office staff. 6. The patient will be responsible for cost sharing (co-pay) of up to 20% of the service fee (after annual deductible is met). Patient agreed to services and consent obtained.  Patient agreed to services and consent obtained.   Consent to Services:  The patient was given information about Bullock Management services, agreed to services, and gave verbal consent prior to initiation of services.  Please Bullock initial visit note for detailed documentation.   Patient agreed to services today and consent obtained.  Engaged with patient's sister by phone in response to provider referral for social  work Bullock coordination services:  Assessment/Interventions: Assessed patient's previous and current treatment, coping skills, support system and barriers to Bullock. Strategies to assist with caregiver stress and supportive resources discussed.  Bullock Bullock Plan below for interventions and patient self-Bullock activities.  Recent life changes or stressors: Management of health conditions  Recommendation: Patient may benefit from, and is in agreement work with LCSW to address Bullock coordination needs and will continue to work with the clinical team to address health Bullock and disease management related needs.   Follow up Plan: Patient would like continued follow-up from CCM LCSW .  per patient's request will follow up in 10/15/21.  Will call office if needed prior to next encounter.  SDOH (Social Determinants of Health) assessments and interventions performed:    Advanced Directives Status: Not addressed in this encounter.  CCM Bullock Plan  No Known Allergies  Outpatient Encounter Medications as of 09/17/2021  Medication Sig   amLODipine (NORVASC) 5 MG tablet Take 1 tablet (5 mg total) by mouth daily.   apixaban (ELIQUIS) 5 MG TABS tablet Take 1 tablet (5 mg total) by mouth 2 (two) times daily.   donepezil (ARICEPT) 5 MG tablet Take 1 tablet (5 mg total) by mouth at bedtime.   hydrochlorothiazide (HYDRODIURIL) 25 MG tablet Take 1 tablet (25 mg total) by mouth daily.   Multiple Vitamin (MULTIVITAMIN WITH MINERALS) TABS tablet Take 1 tablet by mouth daily.   tamsulosin (FLOMAX) 0.4 MG CAPS capsule Take 1 capsule (0.4 mg total) by mouth daily.   No facility-administered encounter medications on file as of 09/17/2021.    Patient Active Problem List   Diagnosis  Date Noted   CAD (coronary artery disease) 08/24/2021   Demand ischemia ([place]) 02/12/2021   Fall at home, initial encounter 02/12/2021   Scalp hematoma, initial encounter 02/12/2021   Dementia without behavioral disturbance ([place]) 01/21/2021    Sinus tachycardia 01/21/2021   DVT (deep venous thrombosis) ([place]) 01/20/2021   Scrotal swelling 11/03/2020   Dizziness 11/03/2020   Dark stools 09/23/2020   Prostate cancer ([place]) 07/03/2019   Elevated PSA 03/28/2019   Encysted hydrocele 03/28/2019   Benign neoplasm of ascending colon    Benign neoplasm of cecum    Hypercholesteremia 05/02/2018   Advanced Bullock planning/counseling discussion 11/10/2017   Essential hypertension, benign 10/10/2017    Conditions to be addressed/monitored: Dementia; ADL IADL limitations, Memory Deficits, and Inability to perform ADL's independently  Bullock Plan : Ronnie Bullock  Updates made by Ronnie Chesterfield, LCSW since 09/20/2021 12:00 AM     Problem: Quality of Life (General Plan of Bullock)      Goal: Quality of Life Maintained   Start Date: 09/17/2021  This Visit's Progress: On track  Priority: Medium  Note:   Current barriers:   Severe Persistent Mental Health needs related to Dementia Level of Bullock concerns, Memory Deficits, Inability to perform ADL's independently, and Inability to perform IADL's independently Needs Support, Education, and Bullock Coordination in order to meet unmet mental health needs. Clinical Goal(s): verbalize basic understanding of Dementia disease process and self health management plan   Clinical Interventions:  Assessed patient's previous and current treatment, coping skills, support system and barriers to Bullock  All hx provided by patient's guardian and sister, Ronnie Bullock Family are interested in resources to keep patient occupied. States patient values independence and has wandered without informing them of his whereabouts  Patient can be irritable with brother occasionally, who provides in-home support. CCM LCSW discussed strategies to assist family with prioritizing patient's safety and providing pt opportunity to complete tasks (visual reminders, creating a routine, etc) independently. Family plan to read 36 hour  day Patient enjoys attending church weekly and is compliant with medications with family support CCM LCSW discussed local resources for dementia/caregiver support, including respite Bullock. Per request, LCSW will mail information to [place], [place] [id number] Active listening / Reflection utilized  Emotional Support Provided Provided psychoeducation for mental health needs  Caregiver stress acknowledged  Verbalization of feelings encouraged  ; Review various resources, discussed options and provided patient information about  Dementia resources and support  1:1 collaboration with primary Bullock provider regarding development and update of comprehensive plan of Bullock as evidenced by provider attestation and co-signature Inter-disciplinary Bullock team collaboration (Bullock longitudinal plan of Bullock) Patient Goals/Self-Bullock Activities: Over the next 120 days Attend appointments with providers and contact PCP office with any questions or concerns Utilize strategies discussed to assist with caregiver stress Review supportive information regarding Dementia support services         Ronnie Bullock, MSW, [email or web address] Phone [phone] 8:54 AM

## 2021-09-21 ENCOUNTER — Telehealth: Payer: Medicare HMO

## 2021-09-22 ENCOUNTER — Ambulatory Visit: Payer: Self-pay | Admitting: *Deleted

## 2021-09-22 NOTE — Telephone Encounter (Signed)
Pt calling stating that the pt has had chest congestion and cough x2 weeks and is not sure if he needs to schedule an appt or wait until the appt on 10/12/21.   Called patient to review symptoms. No answer, left message on voicemail to call back to clinic [phone].

## 2021-09-22 NOTE — Telephone Encounter (Signed)
Pt reports dry cough x 2-3 weeks. Denies fever, no SOB, no sinus issues.States no new medications. States Dr. Wynetta Emery called in  [place], ineffective. Brother present, on [place]. States "Not bringing up anything but you can hear it in his chest." Pt does report some chest tightness.HAs not taken any OTC meds. Appt secured with Santiago Glad for Friday 09/24/21, pt needed AM appt due to transportation issues. Care advise given per protocol. Brother verbalizes understanding.      Reason for Disposition  Cough has been present for > 3 weeks  Answer Assessment - Initial Assessment Questions 1. ONSET: "When did the cough begin?"      2-3 weeks ago 2. SEVERITY: "How bad is the cough today?"      Moderate??  3. SPUTUM: "Describe the color of your sputum" (none, dry cough; clear, white, yellow, green)     Dry 4. HEMOPTYSIS: "Are you coughing up any blood?" If so ask: "How much?" (flecks, streaks, tablespoons, etc.)     no 5. DIFFICULTY BREATHING: "Are you having difficulty breathing?" If Yes, ask: "How bad is it?" (e.g., mild, moderate, severe)    - MILD: No SOB at rest, mild SOB with walking, speaks normally in sentences, can lie down, no retractions, pulse < 100.    - MODERATE: SOB at rest, SOB with minimal exertion and prefers to sit, cannot lie down flat, speaks in phrases, mild retractions, audible wheezing, pulse 100-120.    - SEVERE: Very SOB at rest, speaks in single words, struggling to breathe, sitting hunched forward, retractions, pulse > 120      No SOB 6. FEVER: "Do you have a fever?" If Yes, ask: "What is your temperature, how was it measured, and when did it start?"     no 7. CARDIAC HISTORY: "Do you have any history of heart disease?" (e.g., heart attack, congestive heart failure)      yes 8. LUNG HISTORY: "Do you have any history of lung disease?"  (e.g., pulmonary embolus, asthma, emphysema)     *No Answer* 9. PE RISK FACTORS: "Do you have a history of blood clots?" (or: recent major  surgery, recent prolonged travel, bedridden)     yes 10. OTHER SYMPTOMS: "Do you have any other symptoms?" (e.g., runny nose, wheezing, chest pain)      Chest tightness  Protocols used: Cough - Acute Non-Productive-A-AH

## 2021-09-23 NOTE — Progress Notes (Deleted)
There were no vitals taken for this visit.   Subjective:    Patient ID: Ronnie Bullock, male    DOB: Oct 14, 1946, 75 y.o.   MRN: [id number]  HPI: Ronnie Bullock is a 75 y.o. male  No chief complaint on file.  COUGH Duration: {Blank single:19197::"days","weeks","months"} Circumstances of initial development of cough: {Blank single:19197::"nothing","unknown","URI"} Cough severity: {Blank single:19197::"mild","moderate","severe"} Cough description: {Blank multiple:19196::"productive","non-productive","hacking","barking","dry","wet","irritating"} Aggravating factors:  {Blank multiple:19196::"nothing","worse at night","worse in the AM","talking","exercise"} Alleviating factors: {Blank multiple:19196::"nothing","cold/sinus","mucinex","cough syrup","antibiotics","albuterol","nasal CCS"} Status:  {Blank multiple:19196::"better","worse","stable","fluctuating"} Treatments attempted: {Blank multiple:19196::"none","cold/sinus","mucinex","cough syrup","antibiotics","albuterol","nasal CCS"} Wheezing: {Blank single:19197::"yes","no"} Shortness of breath: {Blank single:19197::"yes","no"} Chest pain: {Blank single:19197::"yes","no"} Chest tightness:{Blank single:19197::"yes","no"} Nasal congestion: {Blank single:19197::"yes","no"} Runny nose: {Blank single:19197::"yes","no"} Postnasal drip: {Blank single:19197::"yes","no"} Frequent throat clearing or swallowing: {Blank single:19197::"yes","no"} Hemoptysis: {Blank single:19197::"yes","no"} Fevers: {Blank single:19197::"yes","no"} Night sweats: {Blank single:19197::"yes","no"} Weight loss: {Blank single:19197::"yes","no"} Heartburn: {Blank single:19197::"yes","no"} Recent foreign travel: {Blank single:19197::"yes","no"} Tuberculosis contacts: {Blank single:19197::"yes","no"}  Relevant past medical, surgical, family and social history reviewed and updated as indicated. Interim medical history since our last visit reviewed. Allergies and  medications reviewed and updated.  Review of Systems  Per HPI unless specifically indicated above     Objective:    There were no vitals taken for this visit.  Wt Readings from Last 3 Encounters:  09/10/21 166 lb 6.4 oz (75.5 kg)  08/19/21 170 lb 12.8 oz (77.5 kg)  04/14/21 158 lb (71.7 kg)    Physical Exam  Results for orders placed or performed in visit on 09/10/21  CBC with Differential/Platelet  Result Value Ref Range   WBC 4.0 3.4 - 10.8 x10E3/uL   RBC 4.99 4.14 - 5.80 x10E6/uL   Hemoglobin 14.7 13.0 - 17.7 g/dL   Hematocrit 44.3 37.5 - 51.0 %   MCV 89 79 - 97 fL   MCH 29.5 26.6 - 33.0 pg   MCHC 33.2 31.5 - 35.7 g/dL   RDW 14.0 11.6 - 15.4 %   Platelets 227 150 - 450 x10E3/uL   Neutrophils 53 Not Estab. %   Lymphs 25 Not Estab. %   Monocytes 13 Not Estab. %   Eos 8 Not Estab. %   Basos 1 Not Estab. %   Neutrophils Absolute 2.2 1.4 - 7.0 x10E3/uL   Lymphocytes Absolute 1.0 0.7 - 3.1 x10E3/uL   Monocytes Absolute 0.5 0.1 - 0.9 x10E3/uL   EOS (ABSOLUTE) 0.3 0.0 - 0.4 x10E3/uL   Basophils Absolute 0.0 0.0 - 0.2 x10E3/uL   Immature Granulocytes 0 Not Estab. %   Immature Grans (Abs) 0.0 0.0 - 0.1 x10E3/uL  Comprehensive metabolic panel  Result Value Ref Range   Glucose 93 70 - 99 mg/dL   BUN 12 8 - 27 mg/dL   Creatinine, Ser 1.14 0.76 - 1.27 mg/dL   eGFR 67 >59 mL/min/1.73   BUN/Creatinine Ratio 11 10 - 24   Sodium 138 134 - 144 mmol/L   Potassium 4.0 3.5 - 5.2 mmol/L   Chloride 101 96 - 106 mmol/L   CO2 24 20 - 29 mmol/L   Calcium 9.3 8.6 - 10.2 mg/dL   Total Protein 7.1 6.0 - 8.5 g/dL   Albumin 3.8 3.7 - 4.7 g/dL   Globulin, Total 3.3 1.5 - 4.5 g/dL   Albumin/Globulin Ratio 1.2 1.2 - 2.2   Bilirubin Total 0.4 0.0 - 1.2 mg/dL   Alkaline Phosphatase 93 44 - 121 IU/L   AST 18 0 - 40 IU/L   ALT 6 0 - 44 IU/L  Lipid Panel w/o Chol/HDL Ratio  Result Value  Ref Range   Cholesterol, Total 159 100 - 199 mg/dL   Triglycerides 59 0 - 149 mg/dL   HDL 60 >39 mg/dL    VLDL Cholesterol Cal 12 5 - 40 mg/dL   LDL Chol Calc (NIH) 87 0 - 99 mg/dL      Assessment & Plan:   Problem List Items Addressed This Visit   None    Follow up plan: No follow-ups on file.

## 2021-09-24 ENCOUNTER — Ambulatory Visit: Payer: Medicare HMO | Admitting: Nurse Practitioner

## 2021-10-06 DIAGNOSIS — E7800 Pure hypercholesterolemia, unspecified: Secondary | ICD-10-CM

## 2021-10-06 DIAGNOSIS — I10 Essential (primary) hypertension: Secondary | ICD-10-CM

## 2021-10-06 DIAGNOSIS — F0390 Unspecified dementia without behavioral disturbance: Secondary | ICD-10-CM | POA: Diagnosis not present

## 2021-10-12 ENCOUNTER — Ambulatory Visit: Payer: Medicare HMO | Admitting: Family Medicine

## 2021-10-12 ENCOUNTER — Ambulatory Visit: Payer: Medicare HMO | Admitting: General Practice

## 2021-10-12 ENCOUNTER — Ambulatory Visit (INDEPENDENT_AMBULATORY_CARE_PROVIDER_SITE_OTHER): Payer: Medicare HMO

## 2021-10-12 DIAGNOSIS — I10 Essential (primary) hypertension: Secondary | ICD-10-CM

## 2021-10-12 DIAGNOSIS — E7800 Pure hypercholesterolemia, unspecified: Secondary | ICD-10-CM

## 2021-10-12 DIAGNOSIS — Z9181 History of falling: Secondary | ICD-10-CM

## 2021-10-12 DIAGNOSIS — F0390 Unspecified dementia without behavioral disturbance: Secondary | ICD-10-CM

## 2021-10-12 DIAGNOSIS — I25118 Atherosclerotic heart disease of native coronary artery with other forms of angina pectoris: Secondary | ICD-10-CM

## 2021-10-12 NOTE — Patient Instructions (Signed)
Visit Information  Thank you for taking time to visit with me today. Please don't hesitate to contact me if I can be of assistance to you before our next scheduled telephone appointment.  Following are the goals we discussed today:  RNCM Clinical Goal(s):  Patient will verbalize understanding of plan for management of HTN, HLD, Dementia, and Falls and Safety Concerns  as evidenced by compliance with the plan of care, compliance with medications, and maintain stable chronic conditions. demonstrate understanding of rationale for each prescribed medication as evidenced by compliance with medications and calling for refills before running out of medications.     attend all scheduled medical appointments: 10-12-2021 at 0920 am as evidenced by keeping appointments and calling the office for reschedule needs. The patient has an upcoming appointment with neurologist but the sister did not have her notebook with that information readily available         continue to work with RN Care Manager and/or Social Worker to address care management and care coordination needs related to HTN, HLD, Dementia, and falls and safety concerns as evidenced by adherence to CM Team Scheduled appointments     work with pharmacist to address Medication procurement and pill packaging system related to HTN, HLD, and Dementia as evidenced by review of EMR and patient or pharmacist report    work with community resource care guide to address needs related to Mental Health Concerns , Memory Deficits, and care giver resouces  as evidenced by patient and/or community resource care guide support    demonstrate a decrease in HTN, HLD, Dementia, and Falls and safety concern exacerbations  as evidenced by effective management of chronic conditions and working with the CCM team to optimize health and well being  demonstrate ongoing self health care management ability for effective management of chronic conditions as evidenced by  working with the  CCM team through collaboration with Consulting civil engineer, provider, and care team.    Interventions: 1:1 collaboration with primary care provider regarding development and update of comprehensive plan of care as evidenced by provider attestation and co-signature Inter-disciplinary care team collaboration (see longitudinal plan of care) Evaluation of current treatment plan related to  self management and patient's adherence to plan as established by provider     SDOH Barriers (Status: Goal on Track (progressing): YES.) Long Term Goal  Patient interviewed and SDOH assessment performed        Patient interviewed and appropriate assessments performed Provided patient with information about resources in [facility] and care guides available to assist with changes in SDOH, new concerns or needs. 10-12-2021: The patient needs resources for transportation in his area to get to provider appointments. The sister Arrie Aran is interested in talking to the care guides about resources for transportation.  Care guide referral made.  Discussed plans with patient for ongoing care management follow up and provided patient with direct contact information for care management team Advised patient to call the office for Changes in SDOH, questions, or concerns Provided education to patient/caregiver regarding level of care options.       Falls:  (Status: Goal on Track (progressing): YES.) Long Term Goal  Provided written and verbal education re: potential causes of falls and Fall prevention strategies Reviewed medications and discussed potential side effects of medications such as dizziness and frequent urination. 10-12-2021: Denies any issues with medications causing dizziness or other issues. Will continue to monitor.  Advised patient of importance of notifying provider of falls Assessed for signs and  symptoms of orthostatic hypotension Assessed for falls since last encounter. 09-15-2021: Per the patients sister Arrie Aran the  patient walks fast with his head down. The patient had a fall last about 3 to 4 months ago. Was taken to the ED for evaluation. No acute findings. Review of fall precautions and safety concerns. The patient lives with his brother Linward Foster and [place] assist as he can with helping the patient. 10-12-2021: The patients sister states the patient is doing well and has had no new falls. Assessed patients knowledge of fall risk prevention secondary to previously provided education Advised patient to discuss new falls, safety concerns, and changes  with provider Screening for signs and symptoms of depression related to chronic disease state Assessed social determinant of health barriers   Dementia  (Status: Goal on Track (progressing): YES.) Long Term Goal  Evaluation of current treatment plan related to Dementia, Mental Health Concerns , Memory Deficits, and caregiver support  self-management and patient's adherence to plan as established by provider. 09-15-2021: The patients sister gave updated information concerning the patient. She lives in [place] but is in contact frequently with the patients brother Linward Foster who lives with the patient and assist in his care. The patient has had an episode where he left the home one day around 7 am and was walking and came home about 7 pm. They were getting ready to call authorities when the patient came back home. The patient does not like to drink water, or take a bath and this is a change. The patient also has had an episode where he has urinated on himself and did not realize it. The patient use to draw and play the guitar but these activities he does not like anymore. Extensive education on how memory changes and dementia progress. Offered resources. The patient does have an upcoming appointment with neurology. Ask the patients sister to write down questions to ask the neurologist. Advised the sister how to reach the [facility] and other team members would be calling to work with the  patient and family. Advised the sister to not take things the patient may say as personal and to relay this to her brother [place] as well. The sister is very receptive to ideas to help the patient meet his health and wellness needs. Education material to be provided by my chart and the EMMI system. 10-12-2021: The patients sister states she has seen a positive change in the patient since starting on Pravastatin. The patient is sleeping well, not eating as much as he was, and not leaving the house unannounced. She states she is very thankful to Dr. Wynetta Emery and the CCM team for all the help received to help the patient.  Discussed plans with patient for ongoing care management follow up and provided patient with direct contact information for care management team Advised patient to call the office for changes in mood, anxiety, depression, or acute memory changes ; Provided education to patient re: The book: The 36-hour day a resources to help caregivers with patients who have dementia.  Also discussed changes to look for as progression of dementia; Reviewed medications with patient and discussed compliance with medications. The patient has been taking aricept but the patients sister states they have not seen changes present. 10-12-2021: The patients sister has seen a positive change with the addition to Aricept to his medications regimen. Education on getting a follow up appointment rescheduled with the pcp due to unable to attend appointment on 10-12-2021 at the pcp office.  Social  Work referral for ongoing support and education for patient with dementia, resouces, and care giver support. 10-12-2021: The patients sister is working with the LCSW for ongoing support and education Pharmacy referral for the patients sister interested in the pill packaging system so the patient can remain as independent as possible with taking medications ; Discussed plans with patient for ongoing care management follow up and  provided patient with direct contact information for care management team; Advised patient to discuss changes in memory, activites the patient is doing, and write down questions to discuss with provider; Screening for signs and symptoms of depression related to chronic disease state;  Assessed social determinant of health barriers;    Hyperlipidemia:  (Status: Goal on Track (progressing): YES.) Long Term Goal       Lab Results  Component Value Date    CHOL 159 09/10/2021    HDL 60 09/10/2021    LDLCALC 87 09/10/2021    TRIG 59 09/10/2021      Medication review performed; medication list updated in electronic medical record.  Provider established cholesterol goals reviewed; Counseled on importance of regular laboratory monitoring as prescribed; Provided HLD educational materials; Reviewed role and benefits of statin for ASCVD risk reduction; Reviewed importance of limiting foods high in cholesterol. 10-12-2021: The patient is eating well.  Reviewed exercise goals and target of 150 minutes per week;   Hypertension: (Status: Goal on Track (progressing): YES.) Last practice recorded BP readings:     BP Readings from Last 3 Encounters:  09/10/21 140/82  08/19/21 (!) 160/100  04/14/21 127/84  Most recent eGFR/CrCl:       Lab Results  Component Value Date    EGFR 67 09/10/2021    No components found for: CRCL   Evaluation of current treatment plan related to hypertension self management and patient's adherence to plan as established by provider;   Provided education to patient re: stroke prevention, s/s of heart attack and stroke; Reviewed prescribed diet heart healthy diet and review of not adding sodium to food due to the patient with swelling noted in feet  Reviewed medications with patient and discussed importance of compliance;  Counseled on adverse effects of illicit drug and excessive alcohol use in patients with high blood pressure;  Discussed plans with patient for ongoing  care management follow up and provided patient with direct contact information for care management team; Advised patient, providing education and rationale, to monitor blood pressure daily and record, calling PCP for findings outside established parameters;  Advised patient to discuss blood pressure trends  with provider; Provided education on prescribed diet heart healthy diet through [facility] system. 10-12-2021: The patients sister states her brother Linward Foster prepares meals for the patient and makes sure he eats well. Denies any issues with heart healthy diet ;  Discussed complications of poorly controlled blood pressure such as heart disease, stroke, circulatory complications, vision complications, kidney impairment, sexual dysfunction;    Patient Goals/Self-Care Activities: Patient will self administer medications as prescribed as evidenced by self report/primary caregiver report  Patient will attend all scheduled provider appointments as evidenced by clinician review of documented attendance to scheduled appointments and patient/caregiver report Patient will call pharmacy for medication refills as evidenced by patient report and review of pharmacy fill history as appropriate Patient will attend church or other social activities as evidenced by patient report Patient will call provider office for new concerns or questions as evidenced by review of documented incoming telephone call notes and patient report Patient will work  with BSW to address care coordination needs and will continue to work with the clinical team to address health care and disease management related needs as evidenced by documented adherence to scheduled care management/care coordination appointments - check blood pressure weekly - choose a place to take my blood pressure (home, clinic or office, retail store) - write blood pressure results in a log or diary - learn about high blood pressure - keep a blood pressure log - take  blood pressure log to all doctor appointments - call doctor for signs and symptoms of high blood pressure - develop an action plan for high blood pressure - keep all doctor appointments-10-12-2021: The patient was unable to keep his appointment in the office today due to transportation needs. The patients sister wants to reschedule. Advised the patients sister to call the office to reschedule appointment with the pcp. Will continue to monitor.  - take medications for blood pressure exactly as prescribed - report new symptoms to your doctor - eat more whole grains, fruits and vegetables, lean meats and healthy fats - call for medicine refill 2 or 3 days before it runs out - take all medications exactly as prescribed - call doctor with any symptoms you believe are related to your medicine - call doctor when you experience any new symptoms - go to all doctor appointments as scheduled - adhere to prescribed diet: Heart Healthy    Our next appointment is by telephone on 11-03-2021 at 1145 am  Please call the care guide team at [phone] if you need to cancel or reschedule your appointment.   If you are experiencing a Mental Health or [place] or need someone to talk to, please call the Suicide and Crisis Lifeline: 988 call the [place] National Suicide Prevention Lifeline: 367-098-4869 or TTY: 563-086-3257 TTY 904 649 0246) to talk to a trained counselor call 1-800-273-TALK (toll free, 24 hour hotline)   Patient verbalizes understanding of instructions provided today and agrees to view in [place].   Noreene Larsson RN, MSN, [facility] Mobile: [phone]

## 2021-10-12 NOTE — Chronic Care Management (AMB) (Signed)
Chronic Care Management   CCM RN Visit Note  10/12/2021 Name: Ronnie Bullock MRN: [id number] DOB: 10-06-1946  Subjective: Ronnie Bullock is a 75 y.o. year old male who is a primary care patient of [name], DO. The care management team was consulted for assistance with disease management and care coordination needs.    Engaged with patient by telephone for follow up visit in response to provider referral for case management and/or care coordination services.   Consent to Services:  The patient was given information about Chronic Care Management services, agreed to services, and gave verbal consent prior to initiation of services.  Please see initial visit note for detailed documentation.   Patient agreed to services and verbal consent obtained.   Assessment: Review of patient past medical history, allergies, medications, health status, including review of consultants reports, laboratory and other test data, was performed as part of comprehensive evaluation and provision of chronic care management services.   SDOH (Social Determinants of Health) assessments and interventions performed:    CCM Care Plan  No Known Allergies  Outpatient Encounter Medications as of 10/12/2021  Medication Sig   amLODipine (NORVASC) 5 MG tablet Take 1 tablet (5 mg total) by mouth daily.   apixaban (ELIQUIS) 5 MG TABS tablet Take 1 tablet (5 mg total) by mouth 2 (two) times daily.   donepezil (ARICEPT) 5 MG tablet Take 1 tablet (5 mg total) by mouth at bedtime.   hydrochlorothiazide (HYDRODIURIL) 25 MG tablet Take 1 tablet (25 mg total) by mouth daily.   Multiple Vitamin (MULTIVITAMIN WITH MINERALS) TABS tablet Take 1 tablet by mouth daily.   tamsulosin (FLOMAX) 0.4 MG CAPS capsule Take 1 capsule (0.4 mg total) by mouth daily.   No facility-administered encounter medications on file as of 10/12/2021.    Patient Active Problem List   Diagnosis Date Noted   CAD (coronary artery disease)  08/24/2021   Demand ischemia ([place]) 02/12/2021   Fall at home, initial encounter 02/12/2021   Scalp hematoma, initial encounter 02/12/2021   Dementia without behavioral disturbance ([place]) 01/21/2021   Sinus tachycardia 01/21/2021   DVT (deep venous thrombosis) ([place]) 01/20/2021   Scrotal swelling 11/03/2020   Dizziness 11/03/2020   Dark stools 09/23/2020   Prostate cancer ([place]) 07/03/2019   Elevated PSA 03/28/2019   Encysted hydrocele 03/28/2019   Benign neoplasm of ascending colon    Benign neoplasm of cecum    Hypercholesteremia 05/02/2018   Advanced care planning/counseling discussion 11/10/2017   Essential hypertension, benign 10/10/2017    Conditions to be addressed/monitored:HTN, HLD, Dementia, and falls and safety concerns  Care Plan : RNCM: General Plan of Care (Adult) for Chronic Disease Management and Care Coordination Needs  Updates made by Vanita Ingles, RN since 10/12/2021 12:00 AM     Problem: RNCM; Development of Plan of Care for Chronic Disease Management and Care Coordination Needs   Priority: High     Long-Range Goal: RNCM: Effective Management of Plan of Care for Chronic Disease Management and Care Coordination Needs   Priority: High  Note:   Current Barriers:  Knowledge Deficits related to plan of care for management of HTN, HLD, and Dementia and Fall and Safety concerns  Care Coordination needs related to Mental Health Concerns , Memory Deficits, and caregiver support and education for patient with dementia  Chronic Disease Management support and education needs related to HTN, HLD, and Dementia, and falls and safety concerns  Memory Concerns- Dementia with memory changes   RNCM  Clinical Goal(s):  Patient will verbalize understanding of plan for management of HTN, HLD, Dementia, and Falls and Safety Concerns  as evidenced by compliance with the plan of care, compliance with medications, and maintain stable chronic conditions. demonstrate understanding of  rationale for each prescribed medication as evidenced by compliance with medications and calling for refills before running out of medications.     attend all scheduled medical appointments: 10-12-2021 at 0920 am as evidenced by keeping appointments and calling the office for reschedule needs. The patient has an upcoming appointment with neurologist but the sister did not have her notebook with that information readily available         continue to work with RN Care Manager and/or Social Worker to address care management and care coordination needs related to HTN, HLD, Dementia, and falls and safety concerns as evidenced by adherence to CM Team Scheduled appointments     work with pharmacist to address Medication procurement and pill packaging system related to HTN, HLD, and Dementia as evidenced by review of EMR and patient or pharmacist report    work with community resource care guide to address needs related to Mental Health Concerns , Memory Deficits, and care giver resouces  as evidenced by patient and/or community resource care guide support    demonstrate a decrease in HTN, HLD, Dementia, and Falls and safety concern exacerbations  as evidenced by effective management of chronic conditions and working with the CCM team to optimize health and well being  demonstrate ongoing self health care management ability for effective management of chronic conditions as evidenced by  working with the CCM team through collaboration with Consulting civil engineer, provider, and care team.   Interventions: 1:1 collaboration with primary care provider regarding development and update of comprehensive plan of care as evidenced by provider attestation and co-signature Inter-disciplinary care team collaboration (see longitudinal plan of care) Evaluation of current treatment plan related to  self management and patient's adherence to plan as established by provider   SDOH Barriers (Status: Goal on Track (progressing): YES.)  Long Term Goal  Patient interviewed and SDOH assessment performed        Patient interviewed and appropriate assessments performed Provided patient with information about resources in [facility] and care guides available to assist with changes in SDOH, new concerns or needs. 10-12-2021: The patient needs resources for transportation in his area to get to provider appointments. The sister Arrie Aran is interested in talking to the care guides about resources for transportation.  Care guide referral made.  Discussed plans with patient for ongoing care management follow up and provided patient with direct contact information for care management team Advised patient to call the office for Changes in SDOH, questions, or concerns Provided education to patient/caregiver regarding level of care options.    Falls:  (Status: Goal on Track (progressing): YES.) Long Term Goal  Provided written and verbal education re: potential causes of falls and Fall prevention strategies Reviewed medications and discussed potential side effects of medications such as dizziness and frequent urination. 10-12-2021: Denies any issues with medications causing dizziness or other issues. Will continue to monitor.  Advised patient of importance of notifying provider of falls Assessed for signs and symptoms of orthostatic hypotension Assessed for falls since last encounter. 09-15-2021: Per the patients sister Arrie Aran the patient walks fast with his head down. The patient had a fall last about 3 to 4 months ago. Was taken to the ED for evaluation. No acute findings. Review of  fall precautions and safety concerns. The patient lives with his brother Linward Foster and [place]illi[place] assist as he can with helping the patient. 10-12-2021: The patients sister states the patient is doing well and has had no new falls. Assessed patients knowledge of fall risk prevention secondary to previously provided education Advised patient to discuss new falls, safety  concerns, and changes  with provider Screening for signs and symptoms of depression related to chronic disease state Assessed social determinant of health barriers  Dementia  (Status: Goal on Track (progressing): YES.) Long Term Goal  Evaluation of current treatment plan related to Dementia, Mental Health Concerns , Memory Deficits, and caregiver support  self-management and patient's adherence to plan as established by provider. 09-15-2021: The patients sister gave updated information concerning the patient. She lives in [place] but is in contact frequently with the patients brother Linward Foster who lives with the patient and assist in his care. The patient has had an episode where he left the home one day around 7 am and was walking and came home about 7 pm. They were getting ready to call authorities when the patient came back home. The patient does not like to drink water, or take a bath and this is a change. The patient also has had an episode where he has urinated on himself and did not realize it. The patient use to draw and play the guitar but these activities he does not like anymore. Extensive education on how memory changes and dementia progress. Offered resources. The patient does have an upcoming appointment with neurology. Ask the patients sister to write down questions to ask the neurologist. Advised the sister how to reach the [facility] and other team members would be calling to work with the patient and family. Advised the sister to not take things the patient may say as personal and to relay this to her brother [place] as well. The sister is very receptive to ideas to help the patient meet his health and wellness needs. Education material to be provided by my chart and the EMMI system. 10-12-2021: The patients sister states she has seen a positive change in the patient since starting on Pravastatin. The patient is sleeping well, not eating as much as he was, and not leaving the house unannounced. She states  she is very thankful to Dr. Wynetta Emery and the CCM team for all the help received to help the patient.  Discussed plans with patient for ongoing care management follow up and provided patient with direct contact information for care management team Advised patient to call the office for changes in mood, anxiety, depression, or acute memory changes ; Provided education to patient re: The book: The 36-hour day a resources to help caregivers with patients who have dementia.  Also discussed changes to look for as progression of dementia; Reviewed medications with patient and discussed compliance with medications. The patient has been taking aricept but the patients sister states they have not seen changes present. 10-12-2021: The patients sister has seen a positive change with the addition to Aricept to his medications regimen. Education on getting a follow up appointment rescheduled with the pcp due to unable to attend appointment on 10-12-2021 at the pcp office.  Social Work referral for ongoing support and education for patient with dementia, resouces, and care giver support. 10-12-2021: The patients sister is working with the LCSW for ongoing support and education Pharmacy referral for the patients sister interested in the pill packaging system so the patient can remain as independent  as possible with taking medications ; Discussed plans with patient for ongoing care management follow up and provided patient with direct contact information for care management team; Advised patient to discuss changes in memory, activites the patient is doing, and write down questions to discuss with provider; Screening for signs and symptoms of depression related to chronic disease state;  Assessed social determinant of health barriers;   Hyperlipidemia:  (Status: Goal on Track (progressing): YES.) Long Term Goal  Lab Results  Component Value Date   CHOL 159 09/10/2021   HDL 60 09/10/2021   LDLCALC 87 09/10/2021   TRIG 59  09/10/2021     Medication review performed; medication list updated in electronic medical record.  Provider established cholesterol goals reviewed; Counseled on importance of regular laboratory monitoring as prescribed; Provided HLD educational materials; Reviewed role and benefits of statin for ASCVD risk reduction; Reviewed importance of limiting foods high in cholesterol. 10-12-2021: The patient is eating well.  Reviewed exercise goals and target of 150 minutes per week;  Hypertension: (Status: Goal on Track (progressing): YES.) Last practice recorded BP readings:  BP Readings from Last 3 Encounters:  09/10/21 140/82  08/19/21 (!) 160/100  04/14/21 127/84  Most recent eGFR/CrCl:  Lab Results  Component Value Date   EGFR 67 09/10/2021    No components found for: CRCL  Evaluation of current treatment plan related to hypertension self management and patient's adherence to plan as established by provider;   Provided education to patient re: stroke prevention, s/s of heart attack and stroke; Reviewed prescribed diet heart healthy diet and review of not adding sodium to food due to the patient with swelling noted in feet  Reviewed medications with patient and discussed importance of compliance;  Counseled on adverse effects of illicit drug and excessive alcohol use in patients with high blood pressure;  Discussed plans with patient for ongoing care management follow up and provided patient with direct contact information for care management team; Advised patient, providing education and rationale, to monitor blood pressure daily and record, calling PCP for findings outside established parameters;  Advised patient to discuss blood pressure trends  with provider; Provided education on prescribed diet heart healthy diet through [employer] system. 10-12-2021: The patients sister states her brother Linward Foster prepares meals for the patient and makes sure he eats well. Denies any issues with heart  healthy diet ;  Discussed complications of poorly controlled blood pressure such as heart disease, stroke, circulatory complications, vision complications, kidney impairment, sexual dysfunction;   Patient Goals/Self-Care Activities: Patient will self administer medications as prescribed as evidenced by self report/primary caregiver report  Patient will attend all scheduled provider appointments as evidenced by clinician review of documented attendance to scheduled appointments and patient/caregiver report Patient will call pharmacy for medication refills as evidenced by patient report and review of pharmacy fill history as appropriate Patient will attend church or other social activities as evidenced by patient report Patient will call provider office for new concerns or questions as evidenced by review of documented incoming telephone call notes and patient report Patient will work with BSW to address care coordination needs and will continue to work with the clinical team to address health care and disease management related needs as evidenced by documented adherence to scheduled care management/care coordination appointments - check blood pressure weekly - choose a place to take my blood pressure (home, clinic or office, retail store) - write blood pressure results in a log or diary - learn about high blood pressure -  keep a blood pressure log - take blood pressure log to all doctor appointments - call doctor for signs and symptoms of high blood pressure - develop an action plan for high blood pressure - keep all doctor appointments-10-12-2021: The patient was unable to keep his appointment in the office today due to transportation needs. The patients sister wants to reschedule. Advised the patients sister to call the office to reschedule appointment with the pcp. Will continue to monitor.  - take medications for blood pressure exactly as prescribed - report new symptoms to your doctor - eat  more whole grains, fruits and vegetables, lean meats and healthy fats - call for medicine refill 2 or 3 days before it runs out - take all medications exactly as prescribed - call doctor with any symptoms you believe are related to your medicine - call doctor when you experience any new symptoms - go to all doctor appointments as scheduled - adhere to prescribed diet: Heart Healthy       Plan:Telephone follow up appointment with care management team member scheduled for:  11-03-2021 at 21 am  Noreene Larsson RN, MSN, [facility] Mobile: [phone]

## 2021-10-13 ENCOUNTER — Telehealth: Payer: Self-pay

## 2021-10-13 NOTE — Telephone Encounter (Signed)
   Telephone encounter was:  Successful.  10/13/2021 Name: Ronnie Bullock MRN: [id number] DOB: [birth year]  Ronnie Bullock is a 75 y.o. year old male who is a primary care patient of [name], DO . The community resource team was consulted for assistance with Transportation Needs   Care guide performed the following interventions:  Spoke to sister Arrie Aran and she advised pt misses appointments due to no rides. Pt is ambulatory and his brother will be riding with him to appts due to pt having dementia. Dawn will be calling to arrange appts in order for transportation to be scheduled.  Follow Up Plan:  Care guide will follow up with patient by phone over the next few days on 12/12 to see if appts were scheduled.  [facility]  [place], [place]  Main Phone: [phone]  E-mail: [email or web address]  Website: [email or web address]

## 2021-10-15 ENCOUNTER — Ambulatory Visit: Payer: Medicare HMO | Admitting: Licensed Clinical Social Worker

## 2021-10-15 DIAGNOSIS — E7800 Pure hypercholesterolemia, unspecified: Secondary | ICD-10-CM

## 2021-10-15 DIAGNOSIS — I25118 Atherosclerotic heart disease of native coronary artery with other forms of angina pectoris: Secondary | ICD-10-CM

## 2021-10-15 DIAGNOSIS — F0390 Unspecified dementia without behavioral disturbance: Secondary | ICD-10-CM

## 2021-10-15 DIAGNOSIS — I10 Essential (primary) hypertension: Secondary | ICD-10-CM

## 2021-10-15 NOTE — Patient Instructions (Signed)
Visit Information  Thank you for taking time to visit with me today. Please don't hesitate to contact me if I can be of assistance to you before our next scheduled telephone appointment.  Following are the goals we discussed today:  Patient Goals/Self-Care Activities: Over the next 120 days Attend appointments with providers and contact PCP office with any questions or concerns Utilize strategies discussed to assist with caregiver stress Review supportive information regarding Dementia support services  Our next appointment is by telephone on 12/27/21   Please call the care guide team at [phone] if you need to cancel or reschedule your appointment.   If you are experiencing a Mental Health or [place] or need someone to talk to, please call 911   Patient verbalizes understanding of instructions provided today and agrees to view in [place].   Christa See, MSW, [email or web address] Phone [phone] 12:59 PM

## 2021-10-15 NOTE — Chronic Care Management (AMB) (Signed)
Chronic Care Management    Clinical Social Work Note  10/15/2021 Name: Ronnie Bullock MRN: [id number] DOB: 07/10/1946  Ronnie Bullock is a 75 y.o. year old male who is a primary care patient of Ronnie Roys, DO. The CCM team was consulted to assist the patient with chronic disease management and/or care coordination needs related to: Mental Health Counseling and Resources and Caregiver Stress.   Engaged with patient's sister, Ronnie Bullock, by telephone for follow up visit in response to provider referral for social work chronic care management and care coordination services.   Consent to Services:  The patient was given information about Chronic Care Management services, agreed to services, and gave verbal consent prior to initiation of services.  Please see initial visit note for detailed documentation.   Patient agreed to services and consent obtained.   Consent to Services:  The patient was given information about Care Management services, agreed to services, and gave verbal consent prior to initiation of services.  Please see initial visit note for detailed documentation.   Patient agreed to services today and consent obtained.  Engaged with patient's sister by phone in response to provider referral for social work care coordination services:  Assessment/Interventions:  Patient continues to maintain positive progress with care plan goals. Family reports improvement with medication management. His irritability has decreased and pt no longer wanders. Transportation services have been provided. See Care Plan below for interventions and patient self-care activities.  Recent life changes or stressors: Management of health, caregiver strain  Recommendation: Patient may benefit from, and is in agreement work with LCSW to address care coordination needs and will continue to work with the clinical team to address health care and disease management related needs.   Follow up Plan: Patient would  like continued follow-up from CCM LCSW.  per patient's request will follow up in 12/27/21.  Will call office if needed prior to next encounter.    SDOH (Social Determinants of Health) assessments and interventions performed:    Advanced Directives Status: Not addressed in this encounter.  CCM Care Plan  No Known Allergies  Outpatient Encounter Medications as of 10/15/2021  Medication Sig   amLODipine (NORVASC) 5 MG tablet Take 1 tablet (5 mg total) by mouth daily.   apixaban (ELIQUIS) 5 MG TABS tablet Take 1 tablet (5 mg total) by mouth 2 (two) times daily.   donepezil (ARICEPT) 5 MG tablet Take 1 tablet (5 mg total) by mouth at bedtime.   hydrochlorothiazide (HYDRODIURIL) 25 MG tablet Take 1 tablet (25 mg total) by mouth daily.   Multiple Vitamin (MULTIVITAMIN WITH MINERALS) TABS tablet Take 1 tablet by mouth daily.   tamsulosin (FLOMAX) 0.4 MG CAPS capsule Take 1 capsule (0.4 mg total) by mouth daily.   No facility-administered encounter medications on file as of 10/15/2021.    Patient Active Problem List   Diagnosis Date Noted   CAD (coronary artery disease) 08/24/2021   Demand ischemia ([place]) 02/12/2021   Fall at home, initial encounter 02/12/2021   Scalp hematoma, initial encounter 02/12/2021   Dementia without behavioral disturbance ([place]) 01/21/2021   Sinus tachycardia 01/21/2021   DVT (deep venous thrombosis) ([place]) 01/20/2021   Scrotal swelling 11/03/2020   Dizziness 11/03/2020   Dark stools 09/23/2020   Prostate cancer ([place]) 07/03/2019   Elevated PSA 03/28/2019   Encysted hydrocele 03/28/2019   Benign neoplasm of ascending colon    Benign neoplasm of cecum    Hypercholesteremia 05/02/2018   Advanced care planning/counseling discussion 11/10/2017  Essential hypertension, benign 10/10/2017    Conditions to be addressed/monitored: CAD, HTN, and Dementia; Caregiver Stress  Care Plan : LCSW Plan of Care  Updates made by Rebekah Chesterfield, LCSW since 10/15/2021 12:00 AM      Problem: Quality of Life (General Plan of Care)      Goal: Quality of Life Maintained   Start Date: 09/17/2021  This Visit's Progress: On track  Recent Progress: On track  Priority: Medium  Note:   Current barriers:   Severe Persistent Mental Health needs related to Dementia Level of care concerns, Memory Deficits, Inability to perform ADL's independently, and Inability to perform IADL's independently Needs Support, Education, and Care Coordination in order to meet unmet mental health needs. Clinical Goal(s): verbalize basic understanding of Dementia disease process and self health management plan   Clinical Interventions:  Assessed patient's previous and current treatment, coping skills, support system and barriers to care  All hx provided by patient's guardian and sister, Ronnie Bullock Family are interested in resources to keep patient occupied. States patient values independence and has wandered without informing them of his whereabouts  Patient can be irritable with brother occasionally, who provides in-home support. CCM LCSW discussed strategies to assist family with prioritizing patient's safety and providing pt opportunity to complete tasks (visual reminders, creating a routine, etc) independently. Family plan to read 36 hour day 12/09: Patient's sister reports patient is doing "much better" His irritability has decreased and he no longer wanders. Family is awaiting to hear from Neurologist to schedule initial appointment Patient's sister has spoken to Care Guide regarding transportation. Pt has been registered through insurance carrier for transportation to medical appointments Patient enjoys attending church weekly and is compliant with medications with family support CCM LCSW discussed local resources for dementia/caregiver support, including respite care. Per request, LCSW will mail information to [place], [place] [id number] Active listening / Reflection utilized   Emotional Support Provided Provided psychoeducation for mental health needs  Caregiver stress acknowledged  Verbalization of feelings encouraged  ; Review various resources, discussed options and provided patient information about  Dementia resources and support  1:1 collaboration with primary care provider regarding development and update of comprehensive plan of care as evidenced by provider attestation and co-signature Inter-disciplinary care team collaboration (see longitudinal plan of care) Patient Goals/Self-Care Activities: Over the next 120 days Attend appointments with providers and contact PCP office with any questions or concerns Utilize strategies discussed to assist with caregiver stress Review supportive information regarding Dementia support services           Christa See, [email or web address] Phone [phone] 12:57 PM

## 2021-10-18 ENCOUNTER — Telehealth: Payer: Self-pay

## 2021-10-18 NOTE — Telephone Encounter (Signed)
   Telephone encounter was:  Successful.  10/18/2021 Name: DUSHAWN PUSEY MRN: [id number] DOB: December 23, 1945  [name] is a 75 y.o. year old male who is a primary care patient of [name], DO . The community resource team was consulted for assistance with Transportation Needs   Care guide performed the following interventions: Advised sister Arrie Aran that transportation has been arranged for pt for 12/16. Pt will be using Product manager through [place]. Sister has been educated on the 3 days in adavance rule as well been provided with [employer]'s contact number. Pt is all set.  Follow Up Plan:  No further follow up planned at this time. The patient has been provided with needed resources. Sister will call me if she has any further questions or concerns. At this time there are no other needs that sister has for pt.  [facility]  [place], [place]  Main Phone: [phone]  E-mail: [email or web address]  Website: [email or web address]

## 2021-10-22 ENCOUNTER — Ambulatory Visit (INDEPENDENT_AMBULATORY_CARE_PROVIDER_SITE_OTHER): Payer: Medicare HMO | Admitting: Family Medicine

## 2021-10-22 ENCOUNTER — Encounter: Payer: Self-pay | Admitting: Family Medicine

## 2021-10-22 ENCOUNTER — Other Ambulatory Visit: Payer: Self-pay

## 2021-10-22 VITALS — BP 127/85 | HR 94 | Temp 97.9°F | Wt 160.6 lb

## 2021-10-22 DIAGNOSIS — I10 Essential (primary) hypertension: Secondary | ICD-10-CM

## 2021-10-22 DIAGNOSIS — J301 Allergic rhinitis due to pollen: Secondary | ICD-10-CM | POA: Insufficient documentation

## 2021-10-22 DIAGNOSIS — R6889 Other general symptoms and signs: Secondary | ICD-10-CM | POA: Diagnosis not present

## 2021-10-22 DIAGNOSIS — F0390 Unspecified dementia without behavioral disturbance: Secondary | ICD-10-CM | POA: Diagnosis not present

## 2021-10-22 HISTORY — DX: Allergic rhinitis due to pollen: J30.1

## 2021-10-22 MED ORDER — FLUTICASONE PROPIONATE 50 MCG/ACT NA SUSP: 2.0000 | Freq: Every day | NASAL | 6 refills | Status: DC

## 2021-10-22 NOTE — Assessment & Plan Note (Signed)
Continue xyzal and start flonase. Call with any concerns. Continue to monitor.

## 2021-10-22 NOTE — Assessment & Plan Note (Signed)
Doing well without his HCTZ. Will not start it. Continue to monitor. Call with any concerns.

## 2021-10-22 NOTE — Progress Notes (Signed)
BP 127/85    Pulse 94    Temp 97.9 F (36.6 C)    Wt 160 lb 9.6 oz (72.8 kg)    SpO2 96%    BMI 23.72 kg/m    Subjective:    Patient ID: Ronnie Bullock, male    DOB: 1946/02/15, 75 y.o.   MRN: [id number]  HPI: Ronnie Bullock is a 75 y.o. male  Chief Complaint  Patient presents with   Hypertension    Patient did not start hydrochlorothiazide    Cough    Patient has been coughing and has congestion, does not think allergy medicine is working    Dementia    Patient has not picked up aricept and in unaware if he has appointment with neurology yet    HYPERTENSION- did not pick up his HCTZ. No swelling in his legs Hypertension status: controlled  Satisfied with current treatment? yes Duration of hypertension: chronic BP monitoring frequency:  not checking BP medication side effects:  no Medication compliance: good compliance Previous BP meds:amlodipine Aspirin: no Recurrent headaches: no Visual changes: no Palpitations: no Dyspnea: no Chest pain: no Lower extremity edema: no Dizzy/lightheaded: no  COUGH Duration: weeks Circumstances of initial development of cough: nothing Cough severity: mild Cough description: non-productive and dry Aggravating factors:  nothing Alleviating factors: nothing Status:  stable Treatments attempted: levocetriozine Wheezing: yes Shortness of breath: no Chest pain: no Chest tightness:no Nasal congestion: no Runny nose: no Postnasal drip: no Frequent throat clearing or swallowing: yes Hemoptysis: no Fevers: no Night sweats: no Weight loss: no Heartburn: no Recent foreign travel: no Tuberculosis contacts: no  Relevant past medical, surgical, family and social history reviewed and updated as indicated. Interim medical history since our last visit reviewed. Allergies and medications reviewed and updated.  Review of Systems  Constitutional: Negative.   HENT:  Positive for congestion, postnasal drip and rhinorrhea. Negative for  dental problem, drooling, ear discharge, ear pain, facial swelling, hearing loss, mouth sores, nosebleeds, sinus pressure, sinus pain, sneezing, sore throat, tinnitus, trouble swallowing and voice change.   Respiratory:  Positive for cough. Negative for apnea, choking, chest tightness, shortness of breath, wheezing and stridor.   Cardiovascular: Negative.   Gastrointestinal: Negative.   Psychiatric/Behavioral: Negative.     Per HPI unless specifically indicated above     Objective:    BP 127/85    Pulse 94    Temp 97.9 F (36.6 C)    Wt 160 lb 9.6 oz (72.8 kg)    SpO2 96%    BMI 23.72 kg/m   Wt Readings from Last 3 Encounters:  10/22/21 160 lb 9.6 oz (72.8 kg)  09/10/21 166 lb 6.4 oz (75.5 kg)  08/19/21 170 lb 12.8 oz (77.5 kg)    Physical Exam Vitals and nursing note reviewed.  Constitutional:      General: He is not in acute distress.    Appearance: Normal appearance. He is not ill-appearing, toxic-appearing or diaphoretic.  HENT:     Head: Normocephalic and atraumatic.     Right Ear: External ear normal.     Left Ear: External ear normal.     Nose: Nose normal.     Mouth/Throat:     Mouth: Mucous membranes are moist.     Pharynx: Oropharynx is clear.  Eyes:     General: No scleral icterus.       Right eye: No discharge.        Left eye: No discharge.  Extraocular Movements: Extraocular movements intact.     Conjunctiva/sclera: Conjunctivae normal.     Pupils: Pupils are equal, round, and reactive to light.  Cardiovascular:     Rate and Rhythm: Normal rate and regular rhythm.     Pulses: Normal pulses.     Heart sounds: Normal heart sounds. No murmur heard.   No friction rub. No gallop.  Pulmonary:     Effort: Pulmonary effort is normal. No respiratory distress.     Breath sounds: Normal breath sounds. No stridor. No wheezing, rhonchi or rales.  Chest:     Chest wall: No tenderness.  Musculoskeletal:        General: Normal range of motion.     Cervical back:  Normal range of motion and neck supple.  Skin:    General: Skin is warm and dry.     Capillary Refill: Capillary refill takes less than 2 seconds.     Coloration: Skin is not jaundiced or pale.     Findings: No bruising, erythema, lesion or rash.  Neurological:     General: No focal deficit present.     Mental Status: He is alert and oriented to person, place, and time. Mental status is at baseline.  Psychiatric:        Mood and Affect: Mood normal.        Behavior: Behavior normal.        Thought Content: Thought content normal.        Judgment: Judgment normal.    Results for orders placed or performed in visit on 09/10/21  CBC with Differential/Platelet  Result Value Ref Range   WBC 4.0 3.4 - 10.8 x10E3/uL   RBC 4.99 4.14 - 5.80 x10E6/uL   Hemoglobin 14.7 13.0 - 17.7 g/dL   Hematocrit 44.3 37.5 - 51.0 %   MCV 89 79 - 97 fL   MCH 29.5 26.6 - 33.0 pg   MCHC 33.2 31.5 - 35.7 g/dL   RDW 14.0 11.6 - 15.4 %   Platelets 227 150 - 450 x10E3/uL   Neutrophils 53 Not Estab. %   Lymphs 25 Not Estab. %   Monocytes 13 Not Estab. %   Eos 8 Not Estab. %   Basos 1 Not Estab. %   Neutrophils Absolute 2.2 1.4 - 7.0 x10E3/uL   Lymphocytes Absolute 1.0 0.7 - 3.1 x10E3/uL   Monocytes Absolute 0.5 0.1 - 0.9 x10E3/uL   EOS (ABSOLUTE) 0.3 0.0 - 0.4 x10E3/uL   Basophils Absolute 0.0 0.0 - 0.2 x10E3/uL   Immature Granulocytes 0 Not Estab. %   Immature Grans (Abs) 0.0 0.0 - 0.1 x10E3/uL  Comprehensive metabolic panel  Result Value Ref Range   Glucose 93 70 - 99 mg/dL   BUN 12 8 - 27 mg/dL   Creatinine, Ser 1.14 0.76 - 1.27 mg/dL   eGFR 67 >59 mL/min/1.73   BUN/Creatinine Ratio 11 10 - 24   Sodium 138 134 - 144 mmol/L   Potassium 4.0 3.5 - 5.2 mmol/L   Chloride 101 96 - 106 mmol/L   CO2 24 20 - 29 mmol/L   Calcium 9.3 8.6 - 10.2 mg/dL   Total Protein 7.1 6.0 - 8.5 g/dL   Albumin 3.8 3.7 - 4.7 g/dL   Globulin, Total 3.3 1.5 - 4.5 g/dL   Albumin/Globulin Ratio 1.2 1.2 - 2.2   Bilirubin  Total 0.4 0.0 - 1.2 mg/dL   Alkaline Phosphatase 93 44 - 121 IU/L   AST 18 0 - 40 IU/L  ALT 6 0 - 44 IU/L  Lipid Panel w/o Chol/HDL Ratio  Result Value Ref Range   Cholesterol, Total 159 100 - 199 mg/dL   Triglycerides 59 0 - 149 mg/dL   HDL 60 >39 mg/dL   VLDL Cholesterol Cal 12 5 - 40 mg/dL   LDL Chol Calc (NIH) 87 0 - 99 mg/dL      Assessment & Plan:   Problem List Items Addressed This Visit       Cardiovascular and Mediastinum   Essential hypertension, benign - Primary    Doing well without his HCTZ. Will not start it. Continue to monitor. Call with any concerns.         Respiratory   Seasonal allergic rhinitis due to pollen    Continue xyzal and start flonase. Call with any concerns. Continue to monitor.         Nervous and Auditory   Dementia without behavioral disturbance ([place])    Encouraged him to pick up his aricept. Call with any concerns. Continue to monitor.         Follow up plan: Return in about 3 months (around 01/20/2022).

## 2021-10-22 NOTE — Assessment & Plan Note (Signed)
Encouraged him to pick up his aricept. Call with any concerns. Continue to monitor.

## 2021-10-25 ENCOUNTER — Ambulatory Visit: Payer: Medicare HMO

## 2021-10-25 ENCOUNTER — Telehealth: Payer: Self-pay

## 2021-10-25 ENCOUNTER — Other Ambulatory Visit: Payer: Self-pay | Admitting: Family Medicine

## 2021-10-25 DIAGNOSIS — F0390 Unspecified dementia without behavioral disturbance: Secondary | ICD-10-CM

## 2021-10-25 DIAGNOSIS — R339 Retention of urine, unspecified: Secondary | ICD-10-CM

## 2021-10-25 DIAGNOSIS — E7800 Pure hypercholesterolemia, unspecified: Secondary | ICD-10-CM

## 2021-10-25 DIAGNOSIS — I10 Essential (primary) hypertension: Secondary | ICD-10-CM

## 2021-10-25 MED ORDER — DONEPEZIL HCL 5 MG PO TABS: 5.0000 mg | ORAL_TABLET | Freq: Every day | ORAL | 1 refills | Status: DC

## 2021-10-25 MED ORDER — TAMSULOSIN HCL 0.4 MG PO CAPS: 0.4000 mg | ORAL_CAPSULE | Freq: Every day | ORAL | 1 refills | Status: DC

## 2021-10-25 MED ORDER — APIXABAN 5 MG PO TABS: 5.0000 mg | ORAL_TABLET | Freq: Two times a day (BID) | ORAL | 1 refills | Status: DC

## 2021-10-25 MED ORDER — AMLODIPINE BESYLATE 5 MG PO TABS: 5.0000 mg | ORAL_TABLET | Freq: Every day | ORAL | 1 refills | Status: DC

## 2021-10-25 MED ORDER — FLUTICASONE PROPIONATE 50 MCG/ACT NA SUSP: 2.0000 | Freq: Every day | NASAL | 6 refills | Status: DC

## 2021-10-25 NOTE — Patient Instructions (Addendum)
Ronnie Bullock,  Thank you for talking with me today. I have included our care plan/goals in the following pages.   Please review and call me at [phone] with any questions.  Thanks! Ronnie Bullock, PharmD Clinical Pharmacist  [phone]  Care Plan : ccm pharmacy care plan  Updates made by Ronnie Bullock, [facility] since 10/25/2021 12:00 AM     Problem: CHL AMB "PATIENT-SPECIFIC PROBLEM"   Priority: High  Note:   HTN HLD Dementia BPH Allergic Rhinitis    Long-Range Goal: disease management   This Visit's Progress: On track  Priority: High  Note:   Current Barriers:  Unable to independently afford treatment regimen Unable to self administer medications as prescribed Does not adhere to prescribed medication regimen  Pharmacist Clinical Goal(s):  Patient will verbalize ability to afford treatment regimen through collaboration with PharmD and provider.   Interventions: 1:1 collaboration with Ronnie Roys, DO regarding development and update of comprehensive plan of care as evidenced by provider attestation and co-signature Inter-disciplinary care team collaboration (see longitudinal plan of care) Comprehensive medication review performed; medication list updated in electronic medical record  Hypertension (BP goal <140/90) -Controlled -Current treatment: Amlodipine 5 mg once daily  -Medications previously tried: hctz 25 mg - not taken due to bp at goal  -Current home readings: has wrist cuff, at goal. -Current dietary habits: unable to assess -Current exercise habits: limited due to dementia - no longer walking. Will still go to bank and church routinely -Denies hypotensive/hypertensive symptoms -Educated on BP goals and benefits of medications for prevention of heart attack, stroke and kidney damage; Importance of home blood pressure monitoring; -Counseled to monitor BP at home 1-2x/wk, document, and provide log at future appointments -Counseled on diet and  exercise extensively Recommended to continue current medication  Hyperlipidemia: (LDL goal < 100) -Controlled -Current treatment: none -Medications previously tried: n/a  -Current dietary patterns: see htn -Current exercise habits: see htn -Educated on Cholesterol goals;  -Recommended to continue current medication  Dementia (Goal: ensure rx start) -Not ideally controlled -Rx filled 10/22/21 - not picked up - patient's sister to coordinate -Current treatment  Donepezil 5 mg once every night  -Medications previously tried: n/a  -Counseled on appropriate use    Verbal consent obtained for [facility] enhanced pharmacy services (medication synchronization, adherence packaging, delivery coordination). A medication sync plan was created to allow patient to get all medications delivered once every 30 to 90 days per patient preference. Patient understands they have freedom to choose pharmacy and clinical pharmacist will coordinate care between all prescribers and [facility].  The patient verbalized understanding of instructions provided today and agreed to receive a MyChart copy of patient instruction and/or educational materials. Telephone follow up appointment with pharmacy team member scheduled for: See next appointment with "Care Management Staff" under "What's Next" below.  Hypertension, Adult High blood pressure (hypertension) is when the force of blood pumping through the arteries is too strong. The arteries are the blood vessels that carry blood from the heart throughout the body. Hypertension forces the heart to work harder to pump blood and may cause arteries to become narrow or stiff. Untreated or uncontrolled hypertension can cause a heart attack, heart failure, a stroke, kidney disease, and other problems. A blood pressure reading consists of a higher number over a lower number. Ideally, your blood pressure should be below 120/80. The first ("top") number is called the  systolic pressure. It is a measure of the pressure  in your arteries as your heart beats. The second ("bottom") number is called the diastolic pressure. It is a measure of the pressure in your arteries as the heart relaxes. What are the causes? The exact cause of this condition is not known. There are some conditions that result in or are related to high blood pressure. What increases the risk? Some risk factors for high blood pressure are under your control. The following factors may make you more likely to develop this condition: Smoking. Having type 2 diabetes mellitus, high cholesterol, or both. Not getting enough exercise or physical activity. Being overweight. Having too much fat, sugar, calories, or salt (sodium) in your diet. Drinking too much alcohol. Some risk factors for high blood pressure may be difficult or impossible to change. Some of these factors include: Having chronic kidney disease. Having a family history of high blood pressure. Age. Risk increases with age. Race. You may be at higher risk if you are African American. Gender. Men are at higher risk than women before age 59. After age 34, women are at higher risk than men. Having obstructive sleep apnea. Stress. What are the signs or symptoms? High blood pressure may not cause symptoms. Very high blood pressure (hypertensive crisis) may cause: Headache. Anxiety. Shortness of breath. Nosebleed. Nausea and vomiting. Vision changes. Severe chest pain. Seizures. How is this diagnosed? This condition is diagnosed by measuring your blood pressure while you are seated, with your arm resting on a flat surface, your legs uncrossed, and your feet flat on the floor. The cuff of the blood pressure monitor will be placed directly against the skin of your upper arm at the level of your heart. It should be measured at least twice using the same arm. Certain conditions can cause a difference in blood pressure between your right and  left arms. Certain factors can cause blood pressure readings to be lower or higher than normal for a short period of time: When your blood pressure is higher when you are in a health care provider's office than when you are at home, this is called white coat hypertension. Most people with this condition do not need medicines. When your blood pressure is higher at home than when you are in a health care provider's office, this is called masked hypertension. Most people with this condition may need medicines to control blood pressure. If you have a high blood pressure reading during one visit or you have normal blood pressure with other risk factors, you may be asked to: Return on a different day to have your blood pressure checked again. Monitor your blood pressure at home for 1 week or longer. If you are diagnosed with hypertension, you may have other blood or imaging tests to help your health care provider understand your overall risk for other conditions. How is this treated? This condition is treated by making healthy lifestyle changes, such as eating healthy foods, exercising more, and reducing your alcohol intake. Your health care provider may prescribe medicine if lifestyle changes are not enough to get your blood pressure under control, and if: Your systolic blood pressure is above 130. Your diastolic blood pressure is above 80. Your personal target blood pressure may vary depending on your medical conditions, your age, and other factors. Follow these instructions at home: Eating and drinking  Eat a diet that is high in fiber and potassium, and low in sodium, added sugar, and fat. An example eating plan is called the DASH (Dietary Approaches to Stop Hypertension)  diet. To eat this way: Eat plenty of fresh fruits and vegetables. Try to fill one half of your plate at each meal with fruits and vegetables. Eat whole grains, such as whole-wheat pasta, brown rice, or whole-grain bread. Fill about  one fourth of your plate with whole grains. Eat or drink low-fat dairy products, such as skim milk or low-fat yogurt. Avoid fatty cuts of meat, processed or cured meats, and poultry with skin. Fill about one fourth of your plate with lean proteins, such as fish, chicken without skin, beans, eggs, or tofu. Avoid pre-made and processed foods. These tend to be higher in sodium, added sugar, and fat. Reduce your daily sodium intake. Most people with hypertension should eat less than 1,500 mg of sodium a day. Do not drink alcohol if: Your health care provider tells you not to drink. You are pregnant, may be pregnant, or are planning to become pregnant. If you drink alcohol: Limit how much you use to: 0-1 drink a day for women. 0-2 drinks a day for men. Be aware of how much alcohol is in your drink. In the [place] one drink equals one 12 oz bottle of beer (355 mL), one 5 oz glass of wine (148 mL), or one 1 oz glass of hard liquor (44 mL). Lifestyle  Work with your health care provider to maintain a healthy body weight or to lose weight. Ask what an ideal weight is for you. Get at least 30 minutes of exercise most days of the week. Activities may include walking, swimming, or biking. Include exercise to strengthen your muscles (resistance exercise), such as Pilates or lifting weights, as part of your weekly exercise routine. Try to do these types of exercises for 30 minutes at least 3 days a week. Do not use any products that contain nicotine or tobacco, such as cigarettes, e-cigarettes, and chewing tobacco. If you need help quitting, ask your health care provider. Monitor your blood pressure at home as told by your health care provider. Keep all follow-up visits as told by your health care provider. This is important. Medicines Take over-the-counter and prescription medicines only as told by your health care provider. Follow directions carefully. Blood pressure medicines must be taken as  prescribed. Do not skip doses of blood pressure medicine. Doing this puts you at risk for problems and can make the medicine less effective. Ask your health care provider about side effects or reactions to medicines that you should watch for. Contact a health care provider if you: Think you are having a reaction to a medicine you are taking. Have headaches that keep coming back (recurring). Feel dizzy. Have swelling in your ankles. Have trouble with your vision. Get help right away if you: Develop a severe headache or confusion. Have unusual weakness or numbness. Feel faint. Have severe pain in your chest or abdomen. Vomit repeatedly. Have trouble breathing. Summary Hypertension is when the force of blood pumping through your arteries is too strong. If this condition is not controlled, it may put you at risk for serious complications. Your personal target blood pressure may vary depending on your medical conditions, your age, and other factors. For most people, a normal blood pressure is less than 120/80. Hypertension is treated with lifestyle changes, medicines, or a combination of both. Lifestyle changes include losing weight, eating a healthy, low-sodium diet, exercising more, and limiting alcohol. This information is not intended to replace advice given to you by your health care provider. Make sure you discuss any questions  you have with your health care provider. Document Revised: 07/04/2018 Document Reviewed: 07/04/2018 Elsevier Patient Education  [place].

## 2021-10-25 NOTE — Chronic Care Management (AMB) (Signed)
° ° °  Chronic Care Management Pharmacy Assistant   Name: Ronnie Bullock  MRN: [id number] DOB: [birth year]  Ronnie Bullock is an 75 y.o. year old male who presents for his initial CCM visit with the clinical pharmacist.  Recent office visits:  10/22/21-Ronnie Annia Friendly, DO (PCP) General follow up visit. Start Flonase. Follow up in 3 months. 09/10/21-Ronnie Annia Friendly, DO (PCP) General follow up visit. Start on hydrochlorothiazide (HYDRODIURIL) 25 MG tablet. Labs ordered. Start on donepezil (ARICEPT) 5 MG tablet. Ambulatory referral to Neurology. AMB Referral to [facility] Coordination. Flu vaccine given. Follow up in 4 weeks.  Recent consult visits:  08/19/21-Ronnie Eloise Levels, MD (Vascular surgery) Seen for follow up on Deep vein thrombosis. Follow up in 6 months.  Hospital visits:  None in previous 6 months  Medications: Outpatient Encounter Medications as of 10/25/2021  Medication Sig   amLODipine (NORVASC) 5 MG tablet Take 1 tablet (5 mg total) by mouth daily.   apixaban (ELIQUIS) 5 MG TABS tablet Take 1 tablet (5 mg total) by mouth 2 (two) times daily.   donepezil (ARICEPT) 5 MG tablet Take 1 tablet (5 mg total) by mouth at bedtime. (Patient not taking: Reported on 10/22/2021)   fluticasone (FLONASE) 50 MCG/ACT nasal spray Place 2 sprays into both nostrils daily.   Multiple Vitamin (MULTIVITAMIN WITH MINERALS) TABS tablet Take 1 tablet by mouth daily.   tamsulosin (FLOMAX) 0.4 MG CAPS capsule Take 1 capsule (0.4 mg total) by mouth daily. (Patient not taking: Reported on 10/22/2021)   No facility-administered encounter medications on file as of 10/25/2021.   AmLODipine (NORVASC) 5 MG tablet Last filled:07/24/21 90 DS Apixaban (ELIQUIS) 5 MG TABS tablet Last filled:08/28/21 30 DS Donepezil (ARICEPT) 5 MG tablet Last filled:09/10/21 90 DS Fluticasone (FLONASE) 50 MCG/ACT nasal spray Last filled:None noted Tamsulosin (FLOMAX) 0.4 MG CAPS capsule Last filled:08/02/21 90  DS   Care Gaps: COVID-19 Vaccine:Never done Zoster Vaccines- Shingrix:Never done   Star Rating Drugs: None noted  Ronnie Bullock, Ronnie Bullock

## 2021-10-25 NOTE — Progress Notes (Signed)
Chronic Care Management Pharmacy Note  10/25/2021 Name:  Ronnie Bullock MRN:  [id number] DOB:  27-Sep-1946  Summary: -would [facility] and use pill [facility]. The follow refills are requested: Flomax Eliquis Aricept Amlodipine Flonase  Subjective: Ronnie Bullock is an 75 y.o. year old male who is a primary patient of [name], DO.  The CCM team was consulted for assistance with disease management and care coordination needs.    Engaged with patient by telephone for initial visit in response to provider referral for pharmacy case management and/or care coordination services. Spoke with legal Guardian and sister, Arrie Aran.   Consent to Services:  The patient was given information about Chronic Care Management services, agreed to services, and gave verbal consent prior to initiation of services.  Please see initial visit note for detailed documentation.   Patient Care Team: [name], DO as PCP - General (Family Medicine) Abbie Sons, MD (Urology) Vanita Ingles, RN as Registered Nurse (General Practice) Rebekah Chesterfield, LCSW as Social Worker (Licensed Clinical Social Worker)  Objective:  Lab Results  Component Value Date   CREATININE 1.14 09/10/2021   CREATININE 1.39 (H) 04/14/2021   CREATININE 1.24 04/02/2021    No results found for: HGBA1C Last diabetic Eye exam: No results found for: HMDIABEYEEXA  Last diabetic Foot exam: No results found for: HMDIABFOOTEX      Component Value Date/Time   CHOL 159 09/10/2021 1106   TRIG 59 09/10/2021 1106   HDL 60 09/10/2021 1106   LDLCALC 87 09/10/2021 1106    Hepatic Function Latest Ref Rng & Units 09/10/2021 04/02/2021 02/13/2021  Total Protein 6.0 - 8.5 g/dL 7.1 7.1 7.4  Albumin 3.7 - 4.7 g/dL 3.8 3.5(L) 2.7(L)  AST 0 - 40 IU/L '18 20 21  ' ALT 0 - 44 IU/L '6 8 8  ' Alk Phosphatase 44 - 121 IU/L 93 91 74  Total Bilirubin 0.0 - 1.2 mg/dL 0.4 <0.2 0.9    Lab Results   Component Value Date/Time   TSH 0.917 04/02/2021 02:10 PM   TSH 1.090 01/23/2020 08:15 AM    CBC Latest Ref Rng & Units 09/10/2021 04/02/2021 03/04/2021  WBC 3.4 - 10.8 x10E3/uL 4.0 5.3 5.6  Hemoglobin 13.0 - 17.7 g/dL 14.7 13.4 13.2  Hematocrit 37.5 - 51.0 % 44.3 40.9 40.0  Platelets 150 - 450 x10E3/uL 227 254 289    No results found for: VD25OH  Clinical ASCVD: Yes  The 10-year ASCVD risk score (Arnett DK, et al., 2019) is: 20.1%   Values used to calculate the score:     Age: 18 years     Sex: Male     Is Non-Hispanic African American: Yes     Diabetic: No     Tobacco smoker: No     Systolic Blood Pressure: 448 mmHg     Is BP treated: Yes     HDL Cholesterol: 60 mg/dL     Total Cholesterol: 159 mg/dL    Other: (CHADS2VASc if Afib, PHQ9 if depression, MMRC or CAT for COPD, ACT, DEXA)  Social History   Tobacco Use  Smoking Status Never  Smokeless Tobacco Never   BP Readings from Last 3 Encounters:  10/22/21 127/85  09/10/21 140/82  08/19/21 (!) 160/100   Pulse Readings from Last 3 Encounters:  10/22/21 94  09/10/21 90  08/19/21 97   Wt Readings from Last 3 Encounters:  10/22/21 160 lb 9.6 oz (72.8 kg)  09/10/21 166 lb 6.4 oz (75.5 kg)  08/19/21 170 lb 12.8 oz (77.5 kg)    Assessment: Review of patient past medical history, allergies, medications, health status, including review of consultants reports, laboratory and other test data, was performed as part of comprehensive evaluation and provision of chronic care management services.   SDOH:  (Social Determinants of Health) assessments and interventions performed: Yes   CCM Care Plan  No Known Allergies  Medications Reviewed Today     Reviewed by [name], [facility] (Pharmacist) on 10/25/21 at 1447  Med List Status: <None>   Medication Order Taking? Sig Documenting Provider Last Dose Status Informant  amLODipine (NORVASC) 5 MG tablet [id number] Yes Take 1 tablet (5 mg total) by mouth daily. Johnson, Megan P,  DO Taking Active   apixaban (ELIQUIS) 5 MG TABS tablet [id number] Yes Take 1 tablet (5 mg total) by mouth 2 (two) times daily. Johnson, Megan P, DO Taking Active   donepezil (ARICEPT) 5 MG tablet [id number] Yes Take 1 tablet (5 mg total) by mouth at bedtime. Johnson, Megan P, DO Taking Active   fluticasone (FLONASE) 50 MCG/ACT nasal spray [id number] Yes Place 2 sprays into both nostrils daily. Park Liter P, DO Taking Active   Multiple Vitamin (MULTIVITAMIN WITH MINERALS) TABS tablet [id number] No Take 1 tablet by mouth daily. Debbe Odea, MD Unknown Active Pharmacy Records  tamsulosin [facility]) 0.4 MG CAPS capsule [id number] Yes Take 1 capsule (0.4 mg total) by mouth daily. [name], DO Taking Active             Patient Active Problem List   Diagnosis Date Noted   Seasonal allergic rhinitis due to pollen 10/22/2021   CAD (coronary artery disease) 08/24/2021   Demand ischemia ([place]) 02/12/2021   Fall at home, initial encounter 02/12/2021   Scalp hematoma, initial encounter 02/12/2021   Dementia without behavioral disturbance ([place]) 01/21/2021   Sinus tachycardia 01/21/2021   DVT (deep venous thrombosis) ([place]) 01/20/2021   Scrotal swelling 11/03/2020   Dizziness 11/03/2020   Dark stools 09/23/2020   Prostate cancer ([place]) 07/03/2019   Elevated PSA 03/28/2019   Encysted hydrocele 03/28/2019   Benign neoplasm of ascending colon    Benign neoplasm of cecum    Hypercholesteremia 05/02/2018   Advanced care planning/counseling discussion 11/10/2017   Essential hypertension, benign 10/10/2017    Immunization History  Administered Date(s) Administered   Fluad Quad(high Dose 65+) 01/23/2020, 11/10/2020, 09/10/2021   Influenza, High Dose Seasonal PF 10/10/2017, 12/10/2018   Pneumococcal Conjugate-13 10/10/2017   Pneumococcal Polysaccharide-23 12/10/2018   Td 01/01/2019    Conditions to be addressed/monitored: HTN, HLD, Dementia, BPH, and Allergic Rhinitis  Care Plan : ccm  pharmacy care plan  Updates made by [name], [place] since 10/25/2021 12:00 AM     Problem: CHL AMB "PATIENT-SPECIFIC PROBLEM"   Priority: High  Note:   HTN HLD Dementia BPH Allergic Rhinitis    Long-Range Goal: disease management   This Visit's Progress: On track  Priority: High  Note:   Current Barriers:  Unable to independently afford treatment regimen Unable to self administer medications as prescribed Does not adhere to prescribed medication regimen  Pharmacist Clinical Goal(s):  Patient will verbalize ability to afford treatment regimen through collaboration with PharmD and provider.   Interventions: 1:1 collaboration with [name], DO regarding development and update of comprehensive plan of care as evidenced by provider attestation and co-signature Inter-disciplinary care team collaboration (see longitudinal plan of care) Comprehensive medication review performed; medication  list updated in electronic medical record  Hypertension (BP goal <140/90) -Controlled -Current treatment: Amlodipine 5 mg once daily  -Medications previously tried: hctz 25 mg - not taken due to bp at goal  -Current home readings: has wrist cuff, at goal. -Current dietary habits: unable to assess -Current exercise habits: limited due to dementia - no longer walking. Will still go to bank and church routinely -Denies hypotensive/hypertensive symptoms -Educated on BP goals and benefits of medications for prevention of heart attack, stroke and kidney damage; Importance of home blood pressure monitoring; -Counseled to monitor BP at home 1-2x/wk, document, and provide log at future appointments -Counseled on diet and exercise extensively Recommended to continue current medication  Hyperlipidemia: (LDL goal < 100) -Controlled -Current treatment: none -Medications previously tried: n/a  -Current dietary patterns: see htn -Current exercise habits: see htn -Educated on Cholesterol goals;   -Recommended to continue current medication  Dementia (Goal: ensure rx start) -Not ideally controlled -Rx filled 10/22/21 - not picked up - patient's sister to coordinate -Current treatment  Donepezil 5 mg once every night  -Medications previously tried: n/a  -Counseled on appropriate use    Patient Goals/Self-Care Activities Patient will:  - take medications as prescribed as evidenced by patient report and record review focus on medication adherence by utilizing pill [facility]  Medication Assistance: [place] through [employer]. Verbal consent obtained for [facility] enhanced pharmacy services (medication synchronization, adherence packaging, delivery coordination). A medication sync plan was created to allow patient to get all medications delivered once every 30 to 90 days per patient preference. Patient understands they have freedom to choose pharmacy and clinical pharmacist will coordinate care between all prescribers and [facility].  Patient's preferred pharmacy is:  [facility] - [place], [place] - [address] [place] [address] Phone: [phone] Fax: [phone]  Uses pill box? Yes  Follow Up:  Patient agrees to Care Plan and Follow-up.  Plan: Telephone follow up appointment with care management team member scheduled for:  10/2021 - RN and SW Monthly CCM medication dispensing calls  [name], PharmD, [place] Pharmacist  [phone]

## 2021-11-03 ENCOUNTER — Ambulatory Visit: Payer: Self-pay

## 2021-11-03 ENCOUNTER — Telehealth: Payer: Medicare HMO

## 2021-11-03 DIAGNOSIS — F0390 Unspecified dementia without behavioral disturbance: Secondary | ICD-10-CM

## 2021-11-03 DIAGNOSIS — Z9181 History of falling: Secondary | ICD-10-CM

## 2021-11-03 DIAGNOSIS — I10 Essential (primary) hypertension: Secondary | ICD-10-CM

## 2021-11-03 DIAGNOSIS — E7800 Pure hypercholesterolemia, unspecified: Secondary | ICD-10-CM

## 2021-11-03 NOTE — Patient Instructions (Signed)
Visit Information  Thank you for taking time to visit with me today. Please don't hesitate to contact me if I can be of assistance to you before our next scheduled telephone appointment.  Following are the goals we discussed today:  RNCM Clinical Goal(s):  Patient will verbalize understanding of plan for management of HTN, HLD, Dementia, and Falls and Safety Concerns  as evidenced by compliance with the plan of care, compliance with medications, and maintain stable chronic conditions. demonstrate understanding of rationale for each prescribed medication as evidenced by compliance with medications and calling for refills before running out of medications.     attend all scheduled medical appointments: 10-12-2021 at 0920 am as evidenced by keeping appointments and calling the office for reschedule needs. The patient has an upcoming appointment with neurologist but the sister did not have her notebook with that information readily available         continue to work with RN Care Manager and/or Social Worker to address care management and care coordination needs related to HTN, HLD, Dementia, and falls and safety concerns as evidenced by adherence to CM Team Scheduled appointments     work with pharmacist to address Medication procurement and pill packaging system related to HTN, HLD, and Dementia as evidenced by review of EMR and patient or pharmacist report    work with community resource care guide to address needs related to Mental Health Concerns , Memory Deficits, and care giver resouces  as evidenced by patient and/or community resource care guide support    demonstrate a decrease in HTN, HLD, Dementia, and Falls and safety concern exacerbations  as evidenced by effective management of chronic conditions and working with the CCM team to optimize health and well being  demonstrate ongoing self health care management ability for effective management of chronic conditions as evidenced by  working with the  CCM team through collaboration with Consulting civil engineer, provider, and care team.    Interventions: 1:1 collaboration with primary care provider regarding development and update of comprehensive plan of care as evidenced by provider attestation and co-signature Inter-disciplinary care team collaboration (see longitudinal plan of care) Evaluation of current treatment plan related to  self management and patient's adherence to plan as established by provider     SDOH Barriers (Status: Goal on Track (progressing): YES.) Long Term Goal  Patient interviewed and SDOH assessment performed        Patient interviewed and appropriate assessments performed Provided patient with information about resources in [facility] and care guides available to assist with changes in SDOH, new concerns or needs. 10-12-2021: The patient needs resources for transportation in his area to get to provider appointments. The sister Arrie Aran is interested in talking to the care guides about resources for transportation.  Care guide referral made. 11-03-2021: Resources for transportation are in place. The patient was able to get to his MD appointment without difficulty.  The patients sister is thankful for all the help and support.  Discussed plans with patient for ongoing care management follow up and provided patient with direct contact information for care management team Advised patient to call the office for Changes in SDOH, questions, or concerns Provided education to patient/caregiver regarding level of care options.       Falls:  (Status: Goal on Track (progressing): YES.) Long Term Goal  Provided written and verbal education re: potential causes of falls and Fall prevention strategies Reviewed medications and discussed potential side effects of medications such as dizziness and  frequent urination. 10-12-2021: Denies any issues with medications causing dizziness or other issues. Will continue to monitor.  Advised patient of  importance of notifying provider of falls. 11-03-2021: Review of notification of new falls to the provider. Denies any new falls.  Assessed for signs and symptoms of orthostatic hypotension Assessed for falls since last encounter. 09-15-2021: Per the patients sister Arrie Aran the patient walks fast with his head down. The patient had a fall last about 3 to 4 months ago. Was taken to the ED for evaluation. No acute findings. Review of fall precautions and safety concerns. The patient lives with his brother Linward Foster and [place] assist as he can with helping the patient. 11-03-2021: The patients sister states the patient is doing well and has had no new falls. Assessed patients knowledge of fall risk prevention secondary to previously provided education Advised patient to discuss new falls, safety concerns, and changes  with provider Screening for signs and symptoms of depression related to chronic disease state Assessed social determinant of health barriers   Dementia  (Status: Goal on Track (progressing): YES.) Long Term Goal  Evaluation of current treatment plan related to Dementia, Mental Health Concerns , Memory Deficits, and caregiver support  self-management and patient's adherence to plan as established by provider. 09-15-2021: The patients sister gave updated information concerning the patient. She lives in [place] but is in contact frequently with the patients brother Linward Foster who lives with the patient and assist in his care. The patient has had an episode where he left the home one day around 7 am and was walking and came home about 7 pm. They were getting ready to call authorities when the patient came back home. The patient does not like to drink water, or take a bath and this is a change. The patient also has had an episode where he has urinated on himself and did not realize it. The patient use to draw and play the guitar but these activities he does not like anymore. Extensive education on how memory  changes and dementia progress. Offered resources. The patient does have an upcoming appointment with neurology. Ask the patients sister to write down questions to ask the neurologist. Advised the sister how to reach the [facility] and other team members would be calling to work with the patient and family. Advised the sister to not take things the patient may say as personal and to relay this to her brother [place] as well. The sister is very receptive to ideas to help the patient meet his health and wellness needs. Education material to be provided by my chart and the EMMI system. 10-12-2021: The patients sister states she has seen a positive change in the patient since starting on Pravastatin. The patient is sleeping well, not eating as much as he was, and not leaving the house unannounced. She states she is very thankful to Dr. Wynetta Emery and the CCM team for all the help received to help the patient. 11-03-2021: The patient is doing well and appetite is not as good but the patient is eating still. Offered samples of Ensure for the patient and the sister feels this will be good for the patient. RNCM will arrange for samples to be delivered to the patients home. The patients sister will let her brother Linward Foster know of the plan to have Ensure samples delivered to the home. The patient is not wanting to take a bath. Education on this being a common thing for patients with dementia. Offered suggestions to the patients  sister to try to encourage the patient to have good hygiene practices. The patients brother Linward Foster sometimes gets frustrated with the way the patient acts but the sister is a good support system and helps her brother understand that it is the disease process not the patient. The sister is thankful for ongoing support and education.  Discussed plans with patient for ongoing care management follow up and provided patient with direct contact information for care management team Advised patient to call the office  for changes in mood, anxiety, depression, or acute memory changes ; Provided education to patient re: The book: The 36-hour day a resources to help caregivers with patients who have dementia.  Also discussed changes to look for as progression of dementia; Reviewed medications with patient and discussed compliance with medications. The patient has been taking aricept but the patients sister states they have not seen changes present. 10-12-2021: The patients sister has seen a positive change with the addition to Aricept to his medications regimen. Education on getting a follow up appointment rescheduled with the pcp due to unable to attend appointment on 10-12-2021 at the pcp office. 11-03-2021: The patients sister talked to the pharm D last week about pill packaging system and wants to get a 90 day supply of the patients medications this way. She has contact information for the pharm D.   Social Work referral for ongoing support and education for patient with dementia, resouces, and care giver support. 11-03-2021: The patients sister is working with the LCSW for ongoing support and education Pharmacy referral for the patients sister interested in the pill packaging system so the patient can remain as independent as possible with taking medications. 11-03-2021: The patients sister is working with the pharm D for support and pill packaging system  ; Discussed plans with patient for ongoing care management follow up and provided patient with direct contact information for care management team; Advised patient to discuss changes in memory, activites the patient is doing, and write down questions to discuss with provider; Screening for signs and symptoms of depression related to chronic disease state;  Assessed social determinant of health barriers;    Hyperlipidemia:  (Status: Goal on Track (progressing): YES.) Long Term Goal       Lab Results  Component Value Date    CHOL 159 09/10/2021    HDL 60 09/10/2021     LDLCALC 87 09/10/2021    TRIG 59 09/10/2021      Medication review performed; medication list updated in electronic medical record. 11-03-2021: The patient is taking as prescribed  Provider established cholesterol goals reviewed; Counseled on importance of regular laboratory monitoring as prescribed. 11-03-2021: Review of lab work. Patient has regular lab work; Provided HLD Scientist, clinical (histocompatibility and immunogenetics); Reviewed role and benefits of statin for ASCVD risk reduction; Reviewed importance of limiting foods high in cholesterol. 11-03-2021: The patient is eating well.  Reviewed exercise goals and target of 150 minutes per week;   Hypertension: (Status: Goal on Track (progressing): YES.) Last practice recorded BP readings:     BP Readings from Last 3 Encounters:  10/22/21 127/85  09/10/21 140/82  08/19/21 (!) 160/100  Most recent eGFR/CrCl:       Lab Results  Component Value Date    EGFR 67 09/10/2021    No components found for: CRCL   Evaluation of current treatment plan related to hypertension self management and patient's adherence to plan as established by provider. 11-03-2021: Review of last readings for blood pressures of 127/85. The patient  is doing well. No issues related to blood pressure or heart health;   Provided education to patient re: stroke prevention, s/s of heart attack and stroke; Reviewed prescribed diet heart healthy diet and review of not adding sodium to food due to the patient with swelling noted in feet  Reviewed medications with patient and discussed importance of compliance. 11-03-2021: States compliance with medications  Counseled on adverse effects of illicit drug and excessive alcohol use in patients with high blood pressure;  Discussed plans with patient for ongoing care management follow up and provided patient with direct contact information for care management team; Advised patient, providing education and rationale, to monitor blood pressure daily and record,  calling PCP for findings outside established parameters;  Advised patient to discuss blood pressure trends  with provider; Provided education on prescribed diet heart healthy diet through [place] system. 11-03-2021: The patients sister states her brother Linward Foster prepares meals for the patient and makes sure he eats well. Denies any issues with heart healthy diet ;  Discussed complications of poorly controlled blood pressure such as heart disease, stroke, circulatory complications, vision complications, kidney impairment, sexual dysfunction;    Patient Goals/Self-Care Activities: Patient will self administer medications as prescribed as evidenced by self report/primary caregiver report  Patient will attend all scheduled provider appointments as evidenced by clinician review of documented attendance to scheduled appointments and patient/caregiver report Patient will call pharmacy for medication refills as evidenced by patient report and review of pharmacy fill history as appropriate Patient will attend church or other social activities as evidenced by patient report Patient will call provider office for new concerns or questions as evidenced by review of documented incoming telephone call notes and patient report Patient will work with BSW to address care coordination needs and will continue to work with the clinical team to address health care and disease management related needs as evidenced by documented adherence to scheduled care management/care coordination appointments - check blood pressure weekly - choose a place to take my blood pressure (home, clinic or office, retail store) - write blood pressure results in a log or diary - learn about high blood pressure - keep a blood pressure log - take blood pressure log to all doctor appointments - call doctor for signs and symptoms of high blood pressure - develop an action plan for high blood pressure - keep all doctor appointments-10-12-2021: The  patient was unable to keep his appointment in the office today due to transportation needs. The patients sister wants to reschedule. Advised the patients sister to call the office to reschedule appointment with the pcp. Will continue to monitor.  - take medications for blood pressure exactly as prescribed - report new symptoms to your doctor - eat more whole grains, fruits and vegetables, lean meats and healthy fats - call for medicine refill 2 or 3 days before it runs out - take all medications exactly as prescribed - call doctor with any symptoms you believe are related to your medicine - call doctor when you experience any new symptoms - go to all doctor appointments as scheduled - adhere to prescribed diet: Heart Healthy      Our next appointment is by telephone on 12-29-2021 at 1145 am  Please call the care guide team at [phone] if you need to cancel or reschedule your appointment.   If you are experiencing a Mental Health or [place] or need someone to talk to, please call the Suicide and Crisis Lifeline: 988 call the  [place] National Suicide Prevention Lifeline: 469-835-1341 or TTY: 310-424-0602 TTY 8300401364) to talk to a trained counselor call 1-800-273-TALK (toll free, 24 hour hotline)   Patient verbalizes understanding of instructions provided today and agrees to view in [place].   Noreene Larsson RN, MSN, [facility] Mobile: [phone]

## 2021-11-03 NOTE — Chronic Care Management (AMB) (Signed)
Chronic Care Management   CCM RN Visit Note  11/03/2021 Name: Ronnie Bullock MRN: [id number] DOB: 06/18/1946  Subjective: Ronnie Bullock is a 75 y.o. year old male who is a primary care patient of Ronnie Roys, DO. The care management team was consulted for assistance with disease management and care coordination needs.    Engaged with patient by telephone for follow up visit in response to provider referral for case management and/or care coordination services.   Consent to Services:  The patient was given information about Chronic Care Management services, agreed to services, and gave verbal consent prior to initiation of services.  Please see initial visit note for detailed documentation.   Patient agreed to services and verbal consent obtained.   Assessment: Review of patient past medical history, allergies, medications, health status, including review of consultants reports, laboratory and other test data, was performed as part of comprehensive evaluation and provision of chronic care management services.   SDOH (Social Determinants of Health) assessments and interventions performed:  SDOH Interventions    Flowsheet Row Most Recent Value  SDOH Interventions   Physical Activity Interventions Other (Comments)  [no structured activity]        CCM Care Plan  No Known Allergies  Outpatient Encounter Medications as of 11/03/2021  Medication Sig   amLODipine (NORVASC) 5 MG tablet Take 1 tablet (5 mg total) by mouth daily.   apixaban (ELIQUIS) 5 MG TABS tablet Take 1 tablet (5 mg total) by mouth 2 (two) times daily.   donepezil (ARICEPT) 5 MG tablet Take 1 tablet (5 mg total) by mouth at bedtime.   fluticasone (FLONASE) 50 MCG/ACT nasal spray Place 2 sprays into both nostrils daily.   Multiple Vitamin (MULTIVITAMIN WITH MINERALS) TABS tablet Take 1 tablet by mouth daily.   tamsulosin (FLOMAX) 0.4 MG CAPS capsule Take 1 capsule (0.4 mg total) by mouth daily.   No  facility-administered encounter medications on file as of 11/03/2021.    Patient Active Problem List   Diagnosis Date Noted   Seasonal allergic rhinitis due to pollen 10/22/2021   CAD (coronary artery disease) 08/24/2021   Demand ischemia ([place]) 02/12/2021   Fall at home, initial encounter 02/12/2021   Scalp hematoma, initial encounter 02/12/2021   Dementia without behavioral disturbance ([place]) 01/21/2021   Sinus tachycardia 01/21/2021   DVT (deep venous thrombosis) ([place]) 01/20/2021   Scrotal swelling 11/03/2020   Dizziness 11/03/2020   Dark stools 09/23/2020   Prostate cancer ([place]) 07/03/2019   Elevated PSA 03/28/2019   Encysted hydrocele 03/28/2019   Benign neoplasm of ascending colon    Benign neoplasm of cecum    Hypercholesteremia 05/02/2018   Advanced care planning/counseling discussion 11/10/2017   Essential hypertension, benign 10/10/2017    Conditions to be addressed/monitored:HTN, HLD, Dementia, and Fall precautions   Care Plan : RNCM: General Plan of Care (Adult) for Chronic Disease Management and Care Coordination Needs  Updates made by Vanita Ingles, RN since 11/03/2021 12:00 AM     Problem: RNCM; Development of Plan of Care for Chronic Disease Management and Care Coordination Needs   Priority: High     Long-Range Goal: RNCM: Effective Management of Plan of Care for Chronic Disease Management (HTN, HLD, Dementia, and Fall Precautions)   Priority: High  Note:   Current Barriers:  Knowledge Deficits related to plan of care for management of HTN, HLD, and Dementia and Fall and Safety concerns  Care Coordination needs related to Mental Health Concerns , Memory  Deficits, and caregiver support and education for patient with dementia  Chronic Disease Management support and education needs related to HTN, HLD, and Dementia, and falls and safety concerns  Memory Concerns- Dementia with memory changes   RNCM Clinical Goal(s):  Patient will verbalize understanding of  plan for management of HTN, HLD, Dementia, and Falls and Safety Concerns  as evidenced by compliance with the plan of care, compliance with medications, and maintain stable chronic conditions. demonstrate understanding of rationale for each prescribed medication as evidenced by compliance with medications and calling for refills before running out of medications.     attend all scheduled medical appointments: 10-12-2021 at 0920 am as evidenced by keeping appointments and calling the office for reschedule needs. The patient has an upcoming appointment with neurologist but the sister did not have her notebook with that information readily available         continue to work with RN Care Manager and/or Social Worker to address care management and care coordination needs related to HTN, HLD, Dementia, and falls and safety concerns as evidenced by adherence to CM Team Scheduled appointments     work with pharmacist to address Medication procurement and pill packaging system related to HTN, HLD, and Dementia as evidenced by review of EMR and patient or pharmacist report    work with community resource care guide to address needs related to Mental Health Concerns , Memory Deficits, and care giver resouces  as evidenced by patient and/or community resource care guide support    demonstrate a decrease in HTN, HLD, Dementia, and Falls and safety concern exacerbations  as evidenced by effective management of chronic conditions and working with the CCM team to optimize health and well being  demonstrate ongoing self health care management ability for effective management of chronic conditions as evidenced by  working with the CCM team through collaboration with Consulting civil engineer, provider, and care team.   Interventions: 1:1 collaboration with primary care provider regarding development and update of comprehensive plan of care as evidenced by provider attestation and co-signature Inter-disciplinary care team  collaboration (see longitudinal plan of care) Evaluation of current treatment plan related to  self management and patient's adherence to plan as established by provider   SDOH Barriers (Status: Goal on Track (progressing): YES.) Long Term Goal  Patient interviewed and SDOH assessment performed        Patient interviewed and appropriate assessments performed Provided patient with information about resources in [facility] and care guides available to assist with changes in SDOH, new concerns or needs. 10-12-2021: The patient needs resources for transportation in his area to get to provider appointments. The sister Arrie Aran is interested in talking to the care guides about resources for transportation.  Care guide referral made. 11-03-2021: Resources for transportation are in place. The patient was able to get to his MD appointment without difficulty.  The patients sister is thankful for all the help and support.  Discussed plans with patient for ongoing care management follow up and provided patient with direct contact information for care management team Advised patient to call the office for Changes in SDOH, questions, or concerns Provided education to patient/caregiver regarding level of care options.    Falls:  (Status: Goal on Track (progressing): YES.) Long Term Goal  Provided written and verbal education re: potential causes of falls and Fall prevention strategies Reviewed medications and discussed potential side effects of medications such as dizziness and frequent urination. 10-12-2021: Denies any issues with medications causing  dizziness or other issues. Will continue to monitor.  Advised patient of importance of notifying provider of falls. 11-03-2021: Review of notification of new falls to the provider. Denies any new falls.  Assessed for signs and symptoms of orthostatic hypotension Assessed for falls since last encounter. 09-15-2021: Per the patients sister Arrie Aran the patient walks fast  with his head down. The patient had a fall last about 3 to 4 months ago. Was taken to the ED for evaluation. No acute findings. Review of fall precautions and safety concerns. The patient lives with his brother Linward Foster and Cade assist as he can with helping the patient. 11-03-2021: The patients sister states the patient is doing well and has had no new falls. Assessed patients knowledge of fall risk prevention secondary to previously provided education Advised patient to discuss new falls, safety concerns, and changes  with provider Screening for signs and symptoms of depression related to chronic disease state Assessed social determinant of health barriers  Dementia  (Status: Goal on Track (progressing): YES.) Long Term Goal  Evaluation of current treatment plan related to Dementia, Mental Health Concerns , Memory Deficits, and caregiver support  self-management and patient's adherence to plan as established by provider. 09-15-2021: The patients sister gave updated information concerning the patient. She lives in [place] but is in contact frequently with the patients brother Linward Foster who lives with the patient and assist in his care. The patient has had an episode where he left the home one day around 7 am and was walking and came home about 7 pm. They were getting ready to call authorities when the patient came back home. The patient does not like to drink water, or take a bath and this is a change. The patient also has had an episode where he has urinated on himself and did not realize it. The patient use to draw and play the guitar but these activities he does not like anymore. Extensive education on how memory changes and dementia progress. Offered resources. The patient does have an upcoming appointment with neurology. Ask the patients sister to write down questions to ask the neurologist. Advised the sister how to reach the [facility] and other team members would be calling to work with the patient and family.  Advised the sister to not take things the patient may say as personal and to relay this to her brother [place] as well. The sister is very receptive to ideas to help the patient meet his health and wellness needs. Education material to be provided by my chart and the EMMI system. 10-12-2021: The patients sister states she has seen a positive change in the patient since starting on Pravastatin. The patient is sleeping well, not eating as much as he was, and not leaving the house unannounced. She states she is very thankful to Dr. Wynetta Emery and the CCM team for all the help received to help the patient. 11-03-2021: The patient is doing well and appetite is not as good but the patient is eating still. Offered samples of Ensure for the patient and the sister feels this will be good for the patient. RNCM will arrange for samples to be delivered to the patients home. The patients sister will let her brother Linward Foster know of the plan to have Ensure samples delivered to the home. The patient is not wanting to take a bath. Education on this being a common thing for patients with dementia. Offered suggestions to the patients sister to try to encourage the patient to have good  hygiene practices. The patients brother Linward Foster sometimes gets frustrated with the way the patient acts but the sister is a good support system and helps her brother understand that it is the disease process not the patient. The sister is thankful for ongoing support and education.  Discussed plans with patient for ongoing care management follow up and provided patient with direct contact information for care management team Advised patient to call the office for changes in mood, anxiety, depression, or acute memory changes ; Provided education to patient re: The book: The 36-hour day a resources to help caregivers with patients who have dementia.  Also discussed changes to look for as progression of dementia; Reviewed medications with patient and  discussed compliance with medications. The patient has been taking aricept but the patients sister states they have not seen changes present. 10-12-2021: The patients sister has seen a positive change with the addition to Aricept to his medications regimen. Education on getting a follow up appointment rescheduled with the pcp due to unable to attend appointment on 10-12-2021 at the pcp office. 11-03-2021: The patients sister talked to the pharm D last week about pill packaging system and wants to get a 90 day supply of the patients medications this way. She has contact information for the pharm D.   Social Work referral for ongoing support and education for patient with dementia, resouces, and care giver support. 11-03-2021: The patients sister is working with the LCSW for ongoing support and education Pharmacy referral for the patients sister interested in the pill packaging system so the patient can remain as independent as possible with taking medications. 11-03-2021: The patients sister is working with the pharm D for support and pill packaging system  ; Discussed plans with patient for ongoing care management follow up and provided patient with direct contact information for care management team; Advised patient to discuss changes in memory, activites the patient is doing, and write down questions to discuss with provider; Screening for signs and symptoms of depression related to chronic disease state;  Assessed social determinant of health barriers;   Hyperlipidemia:  (Status: Goal on Track (progressing): YES.) Long Term Goal  Lab Results  Component Value Date   CHOL 159 09/10/2021   HDL 60 09/10/2021   LDLCALC 87 09/10/2021   TRIG 59 09/10/2021     Medication review performed; medication list updated in electronic medical record. 11-03-2021: The patient is taking as prescribed  Provider established cholesterol goals reviewed; Counseled on importance of regular laboratory monitoring as  prescribed. 11-03-2021: Review of lab work. Patient has regular lab work; Provided HLD Scientist, clinical (histocompatibility and immunogenetics); Reviewed role and benefits of statin for ASCVD risk reduction; Reviewed importance of limiting foods high in cholesterol. 11-03-2021: The patient is eating well.  Reviewed exercise goals and target of 150 minutes per week;  Hypertension: (Status: Goal on Track (progressing): YES.) Last practice recorded BP readings:  BP Readings from Last 3 Encounters:  10/22/21 127/85  09/10/21 140/82  08/19/21 (!) 160/100  Most recent eGFR/CrCl:  Lab Results  Component Value Date   EGFR 67 09/10/2021    No components found for: CRCL  Evaluation of current treatment plan related to hypertension self management and patient's adherence to plan as established by provider. 11-03-2021: Review of last readings for blood pressures of 127/85. The patient is doing well. No issues related to blood pressure or heart health;   Provided education to patient re: stroke prevention, s/s of heart attack and stroke; Reviewed prescribed diet heart healthy  diet and review of not adding sodium to food due to the patient with swelling noted in feet  Reviewed medications with patient and discussed importance of compliance. 11-03-2021: States compliance with medications  Counseled on adverse effects of illicit drug and excessive alcohol use in patients with high blood pressure;  Discussed plans with patient for ongoing care management follow up and provided patient with direct contact information for care management team; Advised patient, providing education and rationale, to monitor blood pressure daily and record, calling PCP for findings outside established parameters;  Advised patient to discuss blood pressure trends  with provider; Provided education on prescribed diet heart healthy diet through [place] system. 11-03-2021: The patients sister states her brother Linward Foster prepares meals for the patient and makes sure he  eats well. Denies any issues with heart healthy diet ;  Discussed complications of poorly controlled blood pressure such as heart disease, stroke, circulatory complications, vision complications, kidney impairment, sexual dysfunction;   Patient Goals/Self-Care Activities: Patient will self administer medications as prescribed as evidenced by self report/primary caregiver report  Patient will attend all scheduled provider appointments as evidenced by clinician review of documented attendance to scheduled appointments and patient/caregiver report Patient will call pharmacy for medication refills as evidenced by patient report and review of pharmacy fill history as appropriate Patient will attend church or other social activities as evidenced by patient report Patient will call provider office for new concerns or questions as evidenced by review of documented incoming telephone call notes and patient report Patient will work with BSW to address care coordination needs and will continue to work with the clinical team to address health care and disease management related needs as evidenced by documented adherence to scheduled care management/care coordination appointments - check blood pressure weekly - choose a place to take my blood pressure (home, clinic or office, retail store) - write blood pressure results in a log or diary - learn about high blood pressure - keep a blood pressure log - take blood pressure log to all doctor appointments - call doctor for signs and symptoms of high blood pressure - develop an action plan for high blood pressure - keep all doctor appointments-10-12-2021: The patient was unable to keep his appointment in the office today due to transportation needs. The patients sister wants to reschedule. Advised the patients sister to call the office to reschedule appointment with the pcp. Will continue to monitor.  - take medications for blood pressure exactly as prescribed -  report new symptoms to your doctor - eat more whole grains, fruits and vegetables, lean meats and healthy fats - call for medicine refill 2 or 3 days before it runs out - take all medications exactly as prescribed - call doctor with any symptoms you believe are related to your medicine - call doctor when you experience any new symptoms - go to all doctor appointments as scheduled - adhere to prescribed diet: Heart Healthy       Plan:Telephone follow up appointment with care management team member scheduled for:  12-29-2021 at 1145 am  Noreene Larsson RN, MSN, [facility] Mobile: [phone]

## 2021-11-06 DIAGNOSIS — I10 Essential (primary) hypertension: Secondary | ICD-10-CM

## 2021-11-06 DIAGNOSIS — I25118 Atherosclerotic heart disease of native coronary artery with other forms of angina pectoris: Secondary | ICD-10-CM | POA: Diagnosis not present

## 2021-11-06 DIAGNOSIS — F0390 Unspecified dementia without behavioral disturbance: Secondary | ICD-10-CM

## 2021-11-06 DIAGNOSIS — E7800 Pure hypercholesterolemia, unspecified: Secondary | ICD-10-CM

## 2021-11-16 ENCOUNTER — Ambulatory Visit (INDEPENDENT_AMBULATORY_CARE_PROVIDER_SITE_OTHER): Payer: Medicare HMO

## 2021-11-16 DIAGNOSIS — E7800 Pure hypercholesterolemia, unspecified: Secondary | ICD-10-CM

## 2021-11-16 DIAGNOSIS — F0390 Unspecified dementia without behavioral disturbance: Secondary | ICD-10-CM

## 2021-11-16 DIAGNOSIS — I10 Essential (primary) hypertension: Secondary | ICD-10-CM

## 2021-11-16 DIAGNOSIS — Z9181 History of falling: Secondary | ICD-10-CM

## 2021-11-16 NOTE — Chronic Care Management (AMB) (Signed)
Chronic Care Management   CCM RN Visit Note  11/16/2021 Name: JODI KAPPES MRN: [id number] DOB: May 01, 1946  Subjective: [name] is a 76 y.o. year old male who is a primary care patient of [name], DO. The care management team was consulted for assistance with disease management and care coordination needs.    Engaged with patient by telephone for follow up visit in response to provider referral for case management and/or care coordination services.   Consent to Services:  The patient was given information about Chronic Care Management services, agreed to services, and gave verbal consent prior to initiation of services.  Please see initial visit note for detailed documentation.   Patient agreed to services and verbal consent obtained.   Assessment: Review of patient past medical history, allergies, medications, health status, including review of consultants reports, laboratory and other test data, was performed as part of comprehensive evaluation and provision of chronic care management services.   SDOH (Social Determinants of Health) assessments and interventions performed:    CCM Care Plan  No Known Allergies  Outpatient Encounter Medications as of 11/16/2021  Medication Sig   amLODipine (NORVASC) 5 MG tablet Take 1 tablet (5 mg total) by mouth daily.   apixaban (ELIQUIS) 5 MG TABS tablet Take 1 tablet (5 mg total) by mouth 2 (two) times daily.   donepezil (ARICEPT) 5 MG tablet Take 1 tablet (5 mg total) by mouth at bedtime.   fluticasone (FLONASE) 50 MCG/ACT nasal spray Place 2 sprays into both nostrils daily.   Multiple Vitamin (MULTIVITAMIN WITH MINERALS) TABS tablet Take 1 tablet by mouth daily.   tamsulosin (FLOMAX) 0.4 MG CAPS capsule Take 1 capsule (0.4 mg total) by mouth daily.   No facility-administered encounter medications on file as of 11/16/2021.    Patient Active Problem List   Diagnosis Date Noted   Seasonal allergic rhinitis due to pollen  10/22/2021   CAD (coronary artery disease) 08/24/2021   Demand ischemia ([place]) 02/12/2021   Fall at home, initial encounter 02/12/2021   Scalp hematoma, initial encounter 02/12/2021   Dementia without behavioral disturbance ([place]) 01/21/2021   Sinus tachycardia 01/21/2021   DVT (deep venous thrombosis) ([place]) 01/20/2021   Scrotal swelling 11/03/2020   Dizziness 11/03/2020   Dark stools 09/23/2020   Prostate cancer ([place]) 07/03/2019   Elevated PSA 03/28/2019   Encysted hydrocele 03/28/2019   Benign neoplasm of ascending colon    Benign neoplasm of cecum    Hypercholesteremia 05/02/2018   Advanced care planning/counseling discussion 11/10/2017   Essential hypertension, benign 10/10/2017    Conditions to be addressed/monitored:HTN, HLD, Dementia, and Falls   Care Plan : RNCM: General Plan of Care (Adult) for Chronic Disease Management and Care Coordination Needs  Updates made by Vanita Ingles, RN since 11/16/2021 12:00 AM     Problem: RNCM; Development of Plan of Care for Chronic Disease Management and Care Coordination Needs   Priority: High     Long-Range Goal: RNCM: Effective Management of Plan of Care for Chronic Disease Management (HTN, HLD, Dementia, and Fall Precautions)   Priority: High  Note:   Current Barriers:  Knowledge Deficits related to plan of care for management of HTN, HLD, and Dementia and Fall and Safety concerns  Care Coordination needs related to Mental Health Concerns , Memory Deficits, and caregiver support and education for patient with dementia  Chronic Disease Management support and education needs related to HTN, HLD, and Dementia, and falls and safety concerns  Memory  Concerns- Dementia with memory changes   RNCM Clinical Goal(s):  Patient will verbalize understanding of plan for management of HTN, HLD, Dementia, and Falls and Safety Concerns  as evidenced by compliance with the plan of care, compliance with medications, and maintain stable chronic  conditions. demonstrate understanding of rationale for each prescribed medication as evidenced by compliance with medications and calling for refills before running out of medications.     attend all scheduled medical appointments: 10-12-2021 at 0920 am as evidenced by keeping appointments and calling the office for reschedule needs. The patient has an upcoming appointment with neurologist but the sister did not have her notebook with that information readily available         continue to work with RN Care Manager and/or Social Worker to address care management and care coordination needs related to HTN, HLD, Dementia, and falls and safety concerns as evidenced by adherence to CM Team Scheduled appointments     work with pharmacist to address Medication procurement and pill packaging system related to HTN, HLD, and Dementia as evidenced by review of EMR and patient or pharmacist report    work with community resource care guide to address needs related to Mental Health Concerns , Memory Deficits, and care giver resouces  as evidenced by patient and/or community resource care guide support    demonstrate a decrease in HTN, HLD, Dementia, and Falls and safety concern exacerbations  as evidenced by effective management of chronic conditions and working with the CCM team to optimize health and well being  demonstrate ongoing self health care management ability for effective management of chronic conditions as evidenced by  working with the CCM team through collaboration with Consulting civil engineer, provider, and care team.   Interventions: 1:1 collaboration with primary care provider regarding development and update of comprehensive plan of care as evidenced by provider attestation and co-signature Inter-disciplinary care team collaboration (see longitudinal plan of care) Evaluation of current treatment plan related to  self management and patient's adherence to plan as established by provider   SDOH Barriers  (Status: Goal on Track (progressing): YES.) Long Term Goal  Patient interviewed and SDOH assessment performed        Patient interviewed and appropriate assessments performed Provided patient with information about resources in [facility] and care guides available to assist with changes in SDOH, new concerns or needs. 10-12-2021: The patient needs resources for transportation in his area to get to provider appointments. The sister Arrie Aran is interested in talking to the care guides about resources for transportation.  Care guide referral made. 11-03-2021: Resources for transportation are in place. The patient was able to get to his MD appointment without difficulty.  The patients sister is thankful for all the help and support.  Discussed plans with patient for ongoing care management follow up and provided patient with direct contact information for care management team Advised patient to call the office for Changes in SDOH, questions, or concerns Provided education to patient/caregiver regarding level of care options.    Falls:  (Status: Goal on Track (progressing): YES.) Long Term Goal  Provided written and verbal education re: potential causes of falls and Fall prevention strategies Reviewed medications and discussed potential side effects of medications such as dizziness and frequent urination. 10-12-2021: Denies any issues with medications causing dizziness or other issues. Will continue to monitor.  Advised patient of importance of notifying provider of falls. 11-16-2021: Review of notification of new falls to the provider. Denies any new  falls.  Assessed for signs and symptoms of orthostatic hypotension Assessed for falls since last encounter. 09-15-2021: Per the patients sister Arrie Aran the patient walks fast with his head down. The patient had a fall last about 3 to 4 months ago. Was taken to the ED for evaluation. No acute findings. Review of fall precautions and safety concerns. The patient  lives with his brother Linward Foster and [place] assist as he can with helping the patient. 11-03-2021: The patients sister states the patient is doing well and has had no new falls. Assessed patients knowledge of fall risk prevention secondary to previously provided education Advised patient to discuss new falls, safety concerns, and changes  with provider Screening for signs and symptoms of depression related to chronic disease state Assessed social determinant of health barriers  Dementia  (Status: Goal on Track (progressing): YES.) Long Term Goal  Evaluation of current treatment plan related to Dementia, Mental Health Concerns , Memory Deficits, and caregiver support  self-management and patient's adherence to plan as established by provider. 09-15-2021: The patients sister gave updated information concerning the patient. She lives in [place] but is in contact frequently with the patients brother Linward Foster who lives with the patient and assist in his care. The patient has had an episode where he left the home one day around 7 am and was walking and came home about 7 pm. They were getting ready to call authorities when the patient came back home. The patient does not like to drink water, or take a bath and this is a change. The patient also has had an episode where he has urinated on himself and did not realize it. The patient use to draw and play the guitar but these activities he does not like anymore. Extensive education on how memory changes and dementia progress. Offered resources. The patient does have an upcoming appointment with neurology. Ask the patients sister to write down questions to ask the neurologist. Advised the sister how to reach the [facility] and other team members would be calling to work with the patient and family. Advised the sister to not take things the patient may say as personal and to relay this to her brother [place] as well. The sister is very receptive to ideas to help the patient meet his  health and wellness needs. Education material to be provided by my chart and the EMMI system. 10-12-2021: The patients sister states she has seen a positive change in the patient since starting on Pravastatin. The patient is sleeping well, not eating as much as he was, and not leaving the house unannounced. She states she is very thankful to Dr. Wynetta Emery and the CCM team for all the help received to help the patient. 11-03-2021: The patient is doing well and appetite is not as good but the patient is eating still. Offered samples of Ensure for the patient and the sister feels this will be good for the patient. RNCM will arrange for samples to be delivered to the patients home. The patients sister will let her brother Linward Foster know of the plan to have Ensure samples delivered to the home. The patient is not wanting to take a bath. Education on this being a common thing for patients with dementia. Offered suggestions to the patients sister to try to encourage the patient to have good hygiene practices. The patients brother Linward Foster sometimes gets frustrated with the way the patient acts but the sister is a good support system and helps her brother understand that it is  the disease process not the patient. The sister is thankful for ongoing support and education. 11-16-2021: Took samples of Ensure to the patient home and hand delivered the Ensure to the patients brother [place]. Identified correct person and announced the reason for the visit. The patients brother received the Ensure samples, coupons, and contact information for the [facility]. No new findings today. Will continue to monitor.  Discussed plans with patient for ongoing care management follow up and provided patient with direct contact information for care management team Advised patient to call the office for changes in mood, anxiety, depression, or acute memory changes ; Provided education to patient re: The book: The 36-hour day a resources to help caregivers  with patients who have dementia.  Also discussed changes to look for as progression of dementia; Reviewed medications with patient and discussed compliance with medications. The patient has been taking aricept but the patients sister states they have not seen changes present. 10-12-2021: The patients sister has seen a positive change with the addition to Aricept to his medications regimen. Education on getting a follow up appointment rescheduled with the pcp due to unable to attend appointment on 10-12-2021 at the pcp office. 11-03-2021: The patients sister talked to the pharm D last week about pill packaging system and wants to get a 90 day supply of the patients medications this way. She has contact information for the pharm D.   Social Work referral for ongoing support and education for patient with dementia, resouces, and care giver support. 11-03-2021: The patients sister is working with the LCSW for ongoing support and education Pharmacy referral for the patients sister interested in the pill packaging system so the patient can remain as independent as possible with taking medications. 11-03-2021: The patients sister is working with the pharm D for support and pill packaging system  ; Discussed plans with patient for ongoing care management follow up and provided patient with direct contact information for care management team; Advised patient to discuss changes in memory, activites the patient is doing, and write down questions to discuss with provider; Screening for signs and symptoms of depression related to chronic disease state;  Assessed social determinant of health barriers;   Hyperlipidemia:  (Status: Goal on Track (progressing): YES.) Long Term Goal  Lab Results  Component Value Date   CHOL 159 09/10/2021   HDL 60 09/10/2021   LDLCALC 87 09/10/2021   TRIG 59 09/10/2021     Medication review performed; medication list updated in electronic medical record. 11-03-2021: The patient is  taking as prescribed  Provider established cholesterol goals reviewed; Counseled on importance of regular laboratory monitoring as prescribed. 11-03-2021: Review of lab work. Patient has regular lab work; Provided HLD Scientist, clinical (histocompatibility and immunogenetics); Reviewed role and benefits of statin for ASCVD risk reduction; Reviewed importance of limiting foods high in cholesterol. 11-16-2021:  The patient is eating well. Provided samples of Ensure for the patient to have additional nutritional drinks with coupons. Will continue to monitor for additional needs for more Ensure samples.  Reviewed exercise goals and target of 150 minutes per week;  Hypertension: (Status: Goal on Track (progressing): YES.) Last practice recorded BP readings:  BP Readings from Last 3 Encounters:  10/22/21 127/85  09/10/21 140/82  08/19/21 (!) 160/100  Most recent eGFR/CrCl:  Lab Results  Component Value Date   EGFR 67 09/10/2021    No components found for: CRCL  Evaluation of current treatment plan related to hypertension self management and patient's adherence to plan as established  by provider. 11-03-2021: Review of last readings for blood pressures of 127/85. The patient is doing well. No issues related to blood pressure or heart health;   Provided education to patient re: stroke prevention, s/s of heart attack and stroke; Reviewed prescribed diet heart healthy diet and review of not adding sodium to food due to the patient with swelling noted in feet. 11-16-2021: Provided samples to the patients brother Linward Foster of Ensure for the patient to use.  Reviewed medications with patient and discussed importance of compliance. 11-03-2021: States compliance with medications  Counseled on adverse effects of illicit drug and excessive alcohol use in patients with high blood pressure;  Discussed plans with patient for ongoing care management follow up and provided patient with direct contact information for care management team; Advised  patient, providing education and rationale, to monitor blood pressure daily and record, calling PCP for findings outside established parameters;  Advised patient to discuss blood pressure trends  with provider; Provided education on prescribed diet heart healthy diet through [place] system. 11-03-2021: The patients sister states her brother Linward Foster prepares meals for the patient and makes sure he eats well. Denies any issues with heart healthy diet ;  Discussed complications of poorly controlled blood pressure such as heart disease, stroke, circulatory complications, vision complications, kidney impairment, sexual dysfunction;   Patient Goals/Self-Care Activities: Patient will self administer medications as prescribed as evidenced by self report/primary caregiver report  Patient will attend all scheduled provider appointments as evidenced by clinician review of documented attendance to scheduled appointments and patient/caregiver report Patient will call pharmacy for medication refills as evidenced by patient report and review of pharmacy fill history as appropriate Patient will attend church or other social activities as evidenced by patient report Patient will call provider office for new concerns or questions as evidenced by review of documented incoming telephone call notes and patient report Patient will work with BSW to address care coordination needs and will continue to work with the clinical team to address health care and disease management related needs as evidenced by documented adherence to scheduled care management/care coordination appointments - check blood pressure weekly - choose a place to take my blood pressure (home, clinic or office, retail store) - write blood pressure results in a log or diary - learn about high blood pressure - keep a blood pressure log - take blood pressure log to all doctor appointments - call doctor for signs and symptoms of high blood pressure -  develop an action plan for high blood pressure - keep all doctor appointments-10-12-2021: The patient was unable to keep his appointment in the office today due to transportation needs. The patients sister wants to reschedule. Advised the patients sister to call the office to reschedule appointment with the pcp. Will continue to monitor.  - take medications for blood pressure exactly as prescribed - report new symptoms to your doctor - eat more whole grains, fruits and vegetables, lean meats and healthy fats - call for medicine refill 2 or 3 days before it runs out - take all medications exactly as prescribed - call doctor with any symptoms you believe are related to your medicine - call doctor when you experience any new symptoms - go to all doctor appointments as scheduled - adhere to prescribed diet: Heart Healthy       Plan:Telephone follow up appointment with care management team member scheduled for:  12-29-2021 at 1145 am   [place], MSN, [place]  [facility] manager Mobile: [phone]

## 2021-11-16 NOTE — Patient Instructions (Signed)
Visit Information  Thank you for taking time to visit with me today. Please don't hesitate to contact me if I can be of assistance to you before our next scheduled telephone appointment.  Following are the goals we discussed today:    RNCM Clinical Goal(s):  Patient will verbalize understanding of plan for management of HTN, HLD, Dementia, and Falls and Safety Concerns  as evidenced by compliance with the plan of care, compliance with medications, and maintain stable chronic conditions. demonstrate understanding of rationale for each prescribed medication as evidenced by compliance with medications and calling for refills before running out of medications.     attend all scheduled medical appointments: 10-12-2021 at 0920 am as evidenced by keeping appointments and calling the office for reschedule needs. The patient has an upcoming appointment with neurologist but the sister did not have her notebook with that information readily available         continue to work with RN Care Manager and/or Social Worker to address care management and care coordination needs related to HTN, HLD, Dementia, and falls and safety concerns as evidenced by adherence to CM Team Scheduled appointments     work with pharmacist to address Medication procurement and pill packaging system related to HTN, HLD, and Dementia as evidenced by review of EMR and patient or pharmacist report    work with community resource care guide to address needs related to Mental Health Concerns , Memory Deficits, and care giver resouces  as evidenced by patient and/or community resource care guide support    demonstrate a decrease in HTN, HLD, Dementia, and Falls and safety concern exacerbations  as evidenced by effective management of chronic conditions and working with the CCM team to optimize health and well being  demonstrate ongoing self health care management ability for effective management of chronic conditions as evidenced by  working with  the CCM team through collaboration with Consulting civil engineer, provider, and care team.    Interventions: 1:1 collaboration with primary care provider regarding development and update of comprehensive plan of care as evidenced by provider attestation and co-signature Inter-disciplinary care team collaboration (see longitudinal plan of care) Evaluation of current treatment plan related to  self management and patient's adherence to plan as established by provider     SDOH Barriers (Status: Goal on Track (progressing): YES.) Long Term Goal  Patient interviewed and SDOH assessment performed        Patient interviewed and appropriate assessments performed Provided patient with information about resources in [facility] and care guides available to assist with changes in SDOH, new concerns or needs. 10-12-2021: The patient needs resources for transportation in his area to get to provider appointments. The sister Arrie Aran is interested in talking to the care guides about resources for transportation.  Care guide referral made. 11-03-2021: Resources for transportation are in place. The patient was able to get to his MD appointment without difficulty.  The patients sister is thankful for all the help and support.  Discussed plans with patient for ongoing care management follow up and provided patient with direct contact information for care management team Advised patient to call the office for Changes in SDOH, questions, or concerns Provided education to patient/caregiver regarding level of care options.       Falls:  (Status: Goal on Track (progressing): YES.) Long Term Goal  Provided written and verbal education re: potential causes of falls and Fall prevention strategies Reviewed medications and discussed potential side effects of medications such as  dizziness and frequent urination. 10-12-2021: Denies any issues with medications causing dizziness or other issues. Will continue to monitor.  Advised patient  of importance of notifying provider of falls. 11-16-2021: Review of notification of new falls to the provider. Denies any new falls.  Assessed for signs and symptoms of orthostatic hypotension Assessed for falls since last encounter. 09-15-2021: Per the patients sister Arrie Aran the patient walks fast with his head down. The patient had a fall last about 3 to 4 months ago. Was taken to the ED for evaluation. No acute findings. Review of fall precautions and safety concerns. The patient lives with his brother Linward Foster and [place] assist as he can with helping the patient. 11-03-2021: The patients sister states the patient is doing well and has had no new falls. Assessed patients knowledge of fall risk prevention secondary to previously provided education Advised patient to discuss new falls, safety concerns, and changes  with provider Screening for signs and symptoms of depression related to chronic disease state Assessed social determinant of health barriers   Dementia  (Status: Goal on Track (progressing): YES.) Long Term Goal  Evaluation of current treatment plan related to Dementia, Mental Health Concerns , Memory Deficits, and caregiver support  self-management and patient's adherence to plan as established by provider. 09-15-2021: The patients sister gave updated information concerning the patient. She lives in [place] but is in contact frequently with the patients brother Linward Foster who lives with the patient and assist in his care. The patient has had an episode where he left the home one day around 7 am and was walking and came home about 7 pm. They were getting ready to call authorities when the patient came back home. The patient does not like to drink water, or take a bath and this is a change. The patient also has had an episode where he has urinated on himself and did not realize it. The patient use to draw and play the guitar but these activities he does not like anymore. Extensive education on how memory  changes and dementia progress. Offered resources. The patient does have an upcoming appointment with neurology. Ask the patients sister to write down questions to ask the neurologist. Advised the sister how to reach the [employer] and other team members would be calling to work with the patient and family. Advised the sister to not take things the patient may say as personal and to relay this to her brother [place] as well. The sister is very receptive to ideas to help the patient meet his health and wellness needs. Education material to be provided by my chart and the EMMI system. 10-12-2021: The patients sister states she has seen a positive change in the patient since starting on Pravastatin. The patient is sleeping well, not eating as much as he was, and not leaving the house unannounced. She states she is very thankful to Dr. Wynetta Emery and the CCM team for all the help received to help the patient. 11-03-2021: The patient is doing well and appetite is not as good but the patient is eating still. Offered samples of Ensure for the patient and the sister feels this will be good for the patient. RNCM will arrange for samples to be delivered to the patients home. The patients sister will let her brother Linward Foster know of the plan to have Ensure samples delivered to the home. The patient is not wanting to take a bath. Education on this being a common thing for patients with dementia. Offered suggestions to  the patients sister to try to encourage the patient to have good hygiene practices. The patients brother Linward Foster sometimes gets frustrated with the way the patient acts but the sister is a good support system and helps her brother understand that it is the disease process not the patient. The sister is thankful for ongoing support and education. 11-16-2021: Took samples of Ensure to the patient home and hand delivered the Ensure to the patients brother [place]. Identified correct person and announced the reason for the visit.  The patients brother received the Ensure samples, coupons, and contact information for the [facility]. No new findings today. Will continue to monitor.  Discussed plans with patient for ongoing care management follow up and provided patient with direct contact information for care management team Advised patient to call the office for changes in mood, anxiety, depression, or acute memory changes ; Provided education to patient re: The book: The 36-hour day a resources to help caregivers with patients who have dementia.  Also discussed changes to look for as progression of dementia; Reviewed medications with patient and discussed compliance with medications. The patient has been taking aricept but the patients sister states they have not seen changes present. 10-12-2021: The patients sister has seen a positive change with the addition to Aricept to his medications regimen. Education on getting a follow up appointment rescheduled with the pcp due to unable to attend appointment on 10-12-2021 at the pcp office. 11-03-2021: The patients sister talked to the pharm D last week about pill packaging system and wants to get a 90 day supply of the patients medications this way. She has contact information for the pharm D.   Social Work referral for ongoing support and education for patient with dementia, resouces, and care giver support. 11-03-2021: The patients sister is working with the LCSW for ongoing support and education Pharmacy referral for the patients sister interested in the pill packaging system so the patient can remain as independent as possible with taking medications. 11-03-2021: The patients sister is working with the pharm D for support and pill packaging system  ; Discussed plans with patient for ongoing care management follow up and provided patient with direct contact information for care management team; Advised patient to discuss changes in memory, activites the patient is doing, and write down  questions to discuss with provider; Screening for signs and symptoms of depression related to chronic disease state;  Assessed social determinant of health barriers;    Hyperlipidemia:  (Status: Goal on Track (progressing): YES.) Long Term Goal       Lab Results  Component Value Date    CHOL 159 09/10/2021    HDL 60 09/10/2021    LDLCALC 87 09/10/2021    TRIG 59 09/10/2021      Medication review performed; medication list updated in electronic medical record. 11-03-2021: The patient is taking as prescribed  Provider established cholesterol goals reviewed; Counseled on importance of regular laboratory monitoring as prescribed. 11-03-2021: Review of lab work. Patient has regular lab work; Provided HLD Scientist, clinical (histocompatibility and immunogenetics); Reviewed role and benefits of statin for ASCVD risk reduction; Reviewed importance of limiting foods high in cholesterol. 11-16-2021:  The patient is eating well. Provided samples of Ensure for the patient to have additional nutritional drinks with coupons. Will continue to monitor for additional needs for more Ensure samples.  Reviewed exercise goals and target of 150 minutes per week;   Hypertension: (Status: Goal on Track (progressing): YES.) Last practice recorded BP readings:  BP Readings from Last 3 Encounters:  10/22/21 127/85  09/10/21 140/82  08/19/21 (!) 160/100  Most recent eGFR/CrCl:       Lab Results  Component Value Date    EGFR 67 09/10/2021    No components found for: CRCL   Evaluation of current treatment plan related to hypertension self management and patient's adherence to plan as established by provider. 11-03-2021: Review of last readings for blood pressures of 127/85. The patient is doing well. No issues related to blood pressure or heart health;   Provided education to patient re: stroke prevention, s/s of heart attack and stroke; Reviewed prescribed diet heart healthy diet and review of not adding sodium to food due to the patient  with swelling noted in feet. 11-16-2021: Provided samples to the patients brother Linward Foster of Ensure for the patient to use.  Reviewed medications with patient and discussed importance of compliance. 11-03-2021: States compliance with medications  Counseled on adverse effects of illicit drug and excessive alcohol use in patients with high blood pressure;  Discussed plans with patient for ongoing care management follow up and provided patient with direct contact information for care management team; Advised patient, providing education and rationale, to monitor blood pressure daily and record, calling PCP for findings outside established parameters;  Advised patient to discuss blood pressure trends  with provider; Provided education on prescribed diet heart healthy diet through [place] system. 11-03-2021: The patients sister states her brother Linward Foster prepares meals for the patient and makes sure he eats well. Denies any issues with heart healthy diet ;  Discussed complications of poorly controlled blood pressure such as heart disease, stroke, circulatory complications, vision complications, kidney impairment, sexual dysfunction;    Patient Goals/Self-Care Activities: Patient will self administer medications as prescribed as evidenced by self report/primary caregiver report  Patient will attend all scheduled provider appointments as evidenced by clinician review of documented attendance to scheduled appointments and patient/caregiver report Patient will call pharmacy for medication refills as evidenced by patient report and review of pharmacy fill history as appropriate Patient will attend church or other social activities as evidenced by patient report Patient will call provider office for new concerns or questions as evidenced by review of documented incoming telephone call notes and patient report Patient will work with BSW to address care coordination needs and will continue to work with the clinical  team to address health care and disease management related needs as evidenced by documented adherence to scheduled care management/care coordination appointments - check blood pressure weekly - choose a place to take my blood pressure (home, clinic or office, retail store) - write blood pressure results in a log or diary - learn about high blood pressure - keep a blood pressure log - take blood pressure log to all doctor appointments - call doctor for signs and symptoms of high blood pressure - develop an action plan for high blood pressure - keep all doctor appointments-10-12-2021: The patient was unable to keep his appointment in the office today due to transportation needs. The patients sister wants to reschedule. Advised the patients sister to call the office to reschedule appointment with the pcp. Will continue to monitor.  - take medications for blood pressure exactly as prescribed - report new symptoms to your doctor - eat more whole grains, fruits and vegetables, lean meats and healthy fats - call for medicine refill 2 or 3 days before it runs out - take all medications exactly as prescribed - call doctor with any symptoms  you believe are related to your medicine - call doctor when you experience any new symptoms - go to all doctor appointments as scheduled - adhere to prescribed diet: Heart Healthy      Our next appointment is by telephone on 12-29-2021 at 1145 am  Please call the care guide team at [phone] if you need to cancel or reschedule your appointment.   If you are experiencing a Mental Health or [place] or need someone to talk to, please call the Suicide and Crisis Lifeline: 988 call the [place] National Suicide Prevention Lifeline: (337)145-5057 or TTY: (548) 640-8183 TTY (747)232-0362) to talk to a trained counselor call 1-800-273-TALK (toll free, 24 hour hotline)   Patient verbalizes understanding of instructions provided today and agrees to view in  [place].   Noreene Larsson RN, MSN, [facility] Mobile: [phone]

## 2021-11-28 ENCOUNTER — Other Ambulatory Visit: Payer: Self-pay | Admitting: Family Medicine

## 2021-11-28 NOTE — Telephone Encounter (Signed)
dc'd 09/10/21 Dr Park Liter   Requested Prescriptions  Refused Prescriptions Disp Refills   levocetirizine (XYZAL) 5 MG tablet [Pharmacy Med Name: LEVOCETIRIZINE 5 MG TABLET] 90 tablet 1    Sig: TAKE 1 TABLET BY MOUTH EVERY DAY IN THE EVENING     Ear, Nose, and Throat:  Antihistamines Passed - 11/28/2021  9:54 AM      Passed - Valid encounter within last 12 months    Recent Outpatient Visits          1 month ago Essential hypertension, benign   [facility] [place], Megan P, DO   2 months ago Dementia without behavioral disturbance ([place])   [place], Megan P, DO   7 months ago Seasonal allergic rhinitis due to pollen   [facility], Megan P, DO   8 months ago Routine general medical examination at a health care facility   [facility] [place] P, DO   8 months ago Acute deep vein thrombosis (DVT) of proximal vein of left lower extremity [facility])   [facility] [name], DO      Future Appointments            In 1 month Johnson, Barb Merino, DO [place]

## 2021-12-01 ENCOUNTER — Encounter: Payer: Self-pay | Admitting: Family Medicine

## 2021-12-07 DIAGNOSIS — E7800 Pure hypercholesterolemia, unspecified: Secondary | ICD-10-CM

## 2021-12-07 DIAGNOSIS — F0390 Unspecified dementia without behavioral disturbance: Secondary | ICD-10-CM | POA: Diagnosis not present

## 2021-12-07 DIAGNOSIS — I10 Essential (primary) hypertension: Secondary | ICD-10-CM

## 2021-12-27 ENCOUNTER — Telehealth: Payer: Medicare HMO

## 2021-12-29 ENCOUNTER — Telehealth: Payer: Self-pay

## 2021-12-29 ENCOUNTER — Telehealth: Payer: Medicare HMO

## 2021-12-29 NOTE — Telephone Encounter (Signed)
°  Care Management   Follow Up Note   12/29/2021 Name: Ronnie Bullock MRN: [id number] DOB: 1946-04-26   Referred by: [name], DO Reason for referral : Chronic Care Management (RNCM: Follow up for Chronic Disease Management and Care Coordination Needs )   An unsuccessful telephone outreach was attempted today. The patient was referred to the case management team for assistance with care management and care coordination.   Follow Up Plan: A HIPPA compliant phone message was left for the patient providing contact information and requesting a return call.   Noreene Larsson RN, MSN, [facility] Mobile: [phone]

## 2021-12-30 ENCOUNTER — Ambulatory Visit (INDEPENDENT_AMBULATORY_CARE_PROVIDER_SITE_OTHER): Payer: Medicare HMO

## 2021-12-30 DIAGNOSIS — Z9181 History of falling: Secondary | ICD-10-CM

## 2021-12-30 DIAGNOSIS — E7800 Pure hypercholesterolemia, unspecified: Secondary | ICD-10-CM

## 2021-12-30 DIAGNOSIS — I25118 Atherosclerotic heart disease of native coronary artery with other forms of angina pectoris: Secondary | ICD-10-CM

## 2021-12-30 DIAGNOSIS — I10 Essential (primary) hypertension: Secondary | ICD-10-CM

## 2021-12-30 DIAGNOSIS — F0390 Unspecified dementia without behavioral disturbance: Secondary | ICD-10-CM

## 2021-12-30 NOTE — Chronic Care Management (AMB) (Signed)
Chronic Care Management   CCM RN Visit Note  12/30/2021 Name: Ronnie Bullock MRN: [id number] DOB: [birth year]  Subjective: Ronnie Bullock is a 76 y.o. year old male who is a primary care patient of [name], DO. The care management team was consulted for assistance with disease management and care coordination needs.    Engaged with patient by telephone for follow up visit in response to provider referral for case management and/or care coordination services.   Consent to Services:  The patient was given information about Chronic Care Management services, agreed to services, and gave verbal consent prior to initiation of services.  Please see initial visit note for detailed documentation.   Patient agreed to services and verbal consent obtained.   Assessment: Review of patient past medical history, allergies, medications, health status, including review of consultants reports, laboratory and other test data, was performed as part of comprehensive evaluation and provision of chronic care management services.   SDOH (Social Determinants of Health) assessments and interventions performed:    CCM Care Plan  No Known Allergies  Outpatient Encounter Medications as of 12/30/2021  Medication Sig   amLODipine (NORVASC) 5 MG tablet Take 1 tablet (5 mg total) by mouth daily.   apixaban (ELIQUIS) 5 MG TABS tablet Take 1 tablet (5 mg total) by mouth 2 (two) times daily.   donepezil (ARICEPT) 5 MG tablet Take 1 tablet (5 mg total) by mouth at bedtime.   fluticasone (FLONASE) 50 MCG/ACT nasal spray Place 2 sprays into both nostrils daily.   Multiple Vitamin (MULTIVITAMIN WITH MINERALS) TABS tablet Take 1 tablet by mouth daily.   tamsulosin (FLOMAX) 0.4 MG CAPS capsule Take 1 capsule (0.4 mg total) by mouth daily.   No facility-administered encounter medications on file as of 12/30/2021.    Patient Active Problem List   Diagnosis Date Noted   Seasonal allergic rhinitis due to pollen  10/22/2021   CAD (coronary artery disease) 08/24/2021   Demand ischemia ([place]) 02/12/2021   Fall at home, initial encounter 02/12/2021   Scalp hematoma, initial encounter 02/12/2021   Dementia without behavioral disturbance ([place]) 01/21/2021   Sinus tachycardia 01/21/2021   DVT (deep venous thrombosis) ([place]) 01/20/2021   Scrotal swelling 11/03/2020   Dizziness 11/03/2020   Dark stools 09/23/2020   Prostate cancer ([place]) 07/03/2019   Elevated PSA 03/28/2019   Encysted hydrocele 03/28/2019   Benign neoplasm of ascending colon    Benign neoplasm of cecum    Hypercholesteremia 05/02/2018   Advanced care planning/counseling discussion 11/10/2017   Essential hypertension, benign 10/10/2017    Conditions to be addressed/monitored:HTN, HLD, Dementia, and Falls  Care Plan : RNCM: General Plan of Care (Adult) for Chronic Disease Management and Care Coordination Needs  Updates made by Vanita Ingles, RN since 12/30/2021 12:00 AM     Problem: RNCM; Development of Plan of Care for Chronic Disease Management and Care Coordination Needs   Priority: High     Long-Range Goal: RNCM: Effective Management of Plan of Care for Chronic Disease Management (HTN, HLD, Dementia, and Fall Precautions)   Priority: High  Note:   Current Barriers:  Knowledge Deficits related to plan of care for management of HTN, HLD, and Dementia and Fall and Safety concerns  Care Coordination needs related to Mental Health Concerns , Memory Deficits, and caregiver support and education for patient with dementia  Chronic Disease Management support and education needs related to HTN, HLD, and Dementia, and falls and safety concerns  Memory Concerns-  Dementia with memory changes   RNCM Clinical Goal(s):  Patient will verbalize understanding of plan for management of HTN, HLD, Dementia, and Falls and Safety Concerns  as evidenced by compliance with the plan of care, compliance with medications, and maintain stable chronic  conditions. demonstrate understanding of rationale for each prescribed medication as evidenced by compliance with medications and calling for refills before running out of medications.     attend all scheduled medical appointments: 01-24-2022 at 140 pm as evidenced by keeping appointments and calling the office for reschedule needs. The patient has an upcoming appointment with neurologist but the sister did not have her notebook with that information readily available  12-30-2021: The patients brother Linward Foster ask if the [facility] could assist with transportation for the patients appointment coming up in March. A care guide referral has been placed for assistance with getting transportation for MD visit.        continue to work with Consulting civil engineer and/or Social Worker to address care management and care coordination needs related to HTN, HLD, Dementia, and falls and safety concerns as evidenced by adherence to CM Team Scheduled appointments     work with pharmacist to address Medication procurement and pill packaging system related to HTN, HLD, and Dementia as evidenced by review of EMR and patient or pharmacist report    work with community resource care guide to address needs related to [place] Concerns , Memory Deficits, and care giver resouces  as evidenced by patient and/or community resource care guide support    demonstrate a decrease in HTN, HLD, Dementia, and Falls and safety concern exacerbations  as evidenced by effective management of chronic conditions and working with the CCM team to optimize health and well being  demonstrate ongoing self health care management ability for effective management of chronic conditions as evidenced by  working with the CCM team through collaboration with Consulting civil engineer, provider, and care team.   Interventions: 1:1 collaboration with primary care provider regarding development and update of comprehensive plan of care as evidenced by provider attestation and  co-signature Inter-disciplinary care team collaboration (see longitudinal plan of care) Evaluation of current treatment plan related to  self management and patient's adherence to plan as established by provider   SDOH Barriers (Status: Goal on Track (progressing): YES.) Long Term Goal  Patient interviewed and SDOH assessment performed        Patient interviewed and appropriate assessments performed Provided patient with information about resources in [facility] and care guides available to assist with changes in SDOH, new concerns or needs. 10-12-2021: The patient needs resources for transportation in his area to get to provider appointments. The sister Arrie Aran is interested in talking to the care guides about resources for transportation.  Care guide referral made. 11-03-2021: Resources for transportation are in place. The patient was able to get to his MD appointment without difficulty.  The patients sister is thankful for all the help and support. 12-30-2021: A new referral for transportation, placed today for the patient to be able to get to his appointment to see the pcp on January 24, 2022.  Discussed plans with patient for ongoing care management follow up and provided patient with direct contact information for care management team Advised patient to call the office for Changes in SDOH, questions, or concerns Provided education to patient/caregiver regarding level of care options.    Falls:  (Status: Goal on Track (progressing): YES.) Long Term Goal  Provided written and verbal education  re: potential causes of falls and Fall prevention strategies Reviewed medications and discussed potential side effects of medications such as dizziness and frequent urination. 12-30-2021: Denies any issues with medications causing dizziness or other issues. Will continue to monitor.  Advised patient of importance of notifying provider of falls. 12-30-2021: Review of notification of new falls to the provider.  Denies any new falls.  Assessed for signs and symptoms of orthostatic hypotension Assessed for falls since last encounter. 09-15-2021: Per the patients sister Arrie Aran the patient walks fast with his head down. The patient had a fall last about 3 to 4 months ago. Was taken to the ED for evaluation. No acute findings. Review of fall precautions and safety concerns. The patient lives with his brother Linward Foster and [place] assist as he can with helping the patient. 11-03-2021: The patients sister states the patient is doing well and has had no new falls. 12-30-2021: The patient nor his brother Linward Foster state the patient has had any new falls, will continue to monitor.  Assessed patients knowledge of fall risk prevention secondary to previously provided education Advised patient to discuss new falls, safety concerns, and changes  with provider Screening for signs and symptoms of depression related to chronic disease state Assessed social determinant of health barriers  Dementia  (Status: Goal on Track (progressing): YES.) Long Term Goal  Evaluation of current treatment plan related to Dementia, Mental Health Concerns , Memory Deficits, and caregiver support  self-management and patient's adherence to plan as established by provider. 09-15-2021: The patients sister gave updated information concerning the patient. She lives in [place] but is in contact frequently with the patients brother Linward Foster who lives with the patient and assist in his care. The patient has had an episode where he left the home one day around 7 am and was walking and came home about 7 pm. They were getting ready to call authorities when the patient came back home. The patient does not like to drink water, or take a bath and this is a change. The patient also has had an episode where he has urinated on himself and did not realize it. The patient use to draw and play the guitar but these activities he does not like anymore. Extensive education on how memory  changes and dementia progress. Offered resources. The patient does have an upcoming appointment with neurology. Ask the patients sister to write down questions to ask the neurologist. Advised the sister how to reach the [facility] and other team members would be calling to work with the patient and family. Advised the sister to not take things the patient may say as personal and to relay this to her brother [place] as well. The sister is very receptive to ideas to help the patient meet his health and wellness needs. Education material to be provided by my chart and the EMMI system. 10-12-2021: The patients sister states she has seen a positive change in the patient since starting on Pravastatin. The patient is sleeping well, not eating as much as he was, and not leaving the house unannounced. She states she is very thankful to Dr. Wynetta Emery and the CCM team for all the help received to help the patient. 11-03-2021: The patient is doing well and appetite is not as good but the patient is eating still. Offered samples of Ensure for the patient and the sister feels this will be good for the patient. RNCM will arrange for samples to be delivered to the patients home. The patients sister will  let her brother Linward Foster know of the plan to have Ensure samples delivered to the home. The patient is not wanting to take a bath. Education on this being a common thing for patients with dementia. Offered suggestions to the patients sister to try to encourage the patient to have good hygiene practices. The patients brother Linward Foster sometimes gets frustrated with the way the patient acts but the sister is a good support system and helps her brother understand that it is the disease process not the patient. The sister is thankful for ongoing support and education. 11-16-2021: Took samples of Ensure to the patient home and hand delivered the Ensure to the patients brother [place]. Identified correct person and announced the reason for the visit.  The patients brother received the Ensure samples, coupons, and contact information for the [facility]. No new findings today. Will continue to monitor. 12-30-2021: The patient endorses eating well and was able to tolerate drinking the Ensure. The patient states he was actually drinking an ensure at the time of the call. The patients brother was on the call with the patient. Denies any acute changes at this time. Review of upcoming appointment with the pcp and answered questions about transportation and gave the patients brother the direct contact information for the [facility]. Will continue to monitor for changes or needs.  Discussed plans with patient for ongoing care management follow up and provided patient with direct contact information for care management team Advised patient to call the office for changes in mood, anxiety, depression, or acute memory changes ; Provided education to patient re: The book: The 36-hour day a resources to help caregivers with patients who have dementia.  Also discussed changes to look for as progression of dementia; Reviewed medications with patient and discussed compliance with medications. The patient has been taking aricept but the patients sister states they have not seen changes present. 10-12-2021: The patients sister has seen a positive change with the addition to Aricept to his medications regimen. Education on getting a follow up appointment rescheduled with the pcp due to unable to attend appointment on 10-12-2021 at the pcp office. 11-03-2021: The patients sister talked to the pharm D last week about pill packaging system and wants to get a 90 day supply of the patients medications this way. She has contact information for the pharm D.   Social Work referral for ongoing support and education for patient with dementia, resouces, and care giver support. 11-03-2021: The patients sister is working with the LCSW for ongoing support and education. 12-30-2021: Ongoing support and  education provided to the patient and family from the LCSW.  Pharmacy referral for the patients sister interested in the pill packaging system so the patient can remain as independent as possible with taking medications. 11-03-2021: The patients sister is working with the pharm D for support and pill packaging system  ; Discussed plans with patient for ongoing care management follow up and provided patient with direct contact information for care management team; Advised patient to discuss changes in memory, activites the patient is doing, and write down questions to discuss with provider; Screening for signs and symptoms of depression related to chronic disease state;  Assessed social determinant of health barriers;   Hyperlipidemia:  (Status: Goal on Track (progressing): YES.) Long Term Goal  Lab Results  Component Value Date   CHOL 159 09/10/2021   HDL 60 09/10/2021   LDLCALC 87 09/10/2021   TRIG 59 09/10/2021     Medication review performed; medication  list updated in electronic medical record. 12-30-2021: The patient is taking as prescribed  Provider established cholesterol goals reviewed; Counseled on importance of regular laboratory monitoring as prescribed. 12-30-2021: Review of lab work. Patient has regular lab work; Provided HLD Scientist, clinical (histocompatibility and immunogenetics); Reviewed role and benefits of statin for ASCVD risk reduction; Reviewed importance of limiting foods high in cholesterol. 11-16-2021:  The patient is eating well. Provided samples of Ensure for the patient to have additional nutritional drinks with coupons. Will continue to monitor for additional needs for more Ensure samples. 12-30-2021: Will provide samples to the patient at his next visit with pcp.  Reviewed exercise goals and target of 150 minutes per week;  Hypertension: (Status: Goal on Track (progressing): YES.) Last practice recorded BP readings:  BP Readings from Last 3 Encounters:  10/22/21 127/85  09/10/21 140/82  08/19/21  (!) 160/100  Most recent eGFR/CrCl:  Lab Results  Component Value Date   EGFR 67 09/10/2021    No components found for: CRCL  Evaluation of current treatment plan related to hypertension self management and patient's adherence to plan as established by provider. 11-03-2021: Review of last readings for blood pressures of 127/85. The patient is doing well. No issues related to blood pressure or heart health. 12-30-2021: No new concerns with HTN or heart health. Will continue to monitor. ;   Provided education to patient re: stroke prevention, s/s of heart attack and stroke; Reviewed prescribed diet heart healthy diet and review of not adding sodium to food due to the patient with swelling noted in feet. 11-16-2021: Provided samples to the patients brother Linward Foster of Ensure for the patient to use.  Reviewed medications with patient and discussed importance of compliance. 12-30-2021: States compliance with medications  Counseled on adverse effects of illicit drug and excessive alcohol use in patients with high blood pressure;  Discussed plans with patient for ongoing care management follow up and provided patient with direct contact information for care management team; Advised patient, providing education and rationale, to monitor blood pressure daily and record, calling PCP for findings outside established parameters;  Advised patient to discuss blood pressure trends  with provider; Provided education on prescribed diet heart healthy diet through [place] system. 11-03-2021: The patients sister states her brother Linward Foster prepares meals for the patient and makes sure he eats well. Denies any issues with heart healthy diet . 12-30-2021: Spoke with the patient and the patients brother today. The patient states he is eating good and his brother cooks good food for him.   Discussed complications of poorly controlled blood pressure such as heart disease, stroke, circulatory complications, vision complications,  kidney impairment, sexual dysfunction;   Patient Goals/Self-Care Activities: Patient will self administer medications as prescribed as evidenced by self report/primary caregiver report  Patient will attend all scheduled provider appointments as evidenced by clinician review of documented attendance to scheduled appointments and patient/caregiver report Patient will call pharmacy for medication refills as evidenced by patient report and review of pharmacy fill history as appropriate Patient will attend church or other social activities as evidenced by patient report Patient will call provider office for new concerns or questions as evidenced by review of documented incoming telephone call notes and patient report Patient will work with BSW to address care coordination needs and will continue to work with the clinical team to address health care and disease management related needs as evidenced by documented adherence to scheduled care management/care coordination appointments - check blood pressure weekly - choose a place to  take my blood pressure (home, clinic or office, retail store) - write blood pressure results in a log or diary - learn about high blood pressure - keep a blood pressure log - take blood pressure log to all doctor appointments - call doctor for signs and symptoms of high blood pressure - develop an action plan for high blood pressure - keep all doctor appointments-10-12-2021: The patient was unable to keep his appointment in the office today due to transportation needs. The patients sister wants to reschedule. Advised the patients sister to call the office to reschedule appointment with the pcp. Will continue to monitor.  - take medications for blood pressure exactly as prescribed - report new symptoms to your doctor - eat more whole grains, fruits and vegetables, lean meats and healthy fats - call for medicine refill 2 or 3 days before it runs out - take all medications  exactly as prescribed - call doctor with any symptoms you believe are related to your medicine - call doctor when you experience any new symptoms - go to all doctor appointments as scheduled - adhere to prescribed diet: Heart Healthy       Plan:Telephone follow up appointment with care management team member scheduled for:  02-04-2022 at 145 pm  Floyd, MSN, [facility] Mobile: [phone]

## 2021-12-30 NOTE — Patient Instructions (Signed)
Visit Information  Thank you for taking time to visit with me today. Please don't hesitate to contact me if I can be of assistance to you before our next scheduled telephone appointment.  Following are the goals we discussed today:  RNCM Clinical Goal(s):  Patient will verbalize understanding of plan for management of HTN, HLD, Dementia, and Falls and Safety Concerns  as evidenced by compliance with the plan of care, compliance with medications, and maintain stable chronic conditions. demonstrate understanding of rationale for each prescribed medication as evidenced by compliance with medications and calling for refills before running out of medications.     attend all scheduled medical appointments: 01-24-2022 at 140 pm as evidenced by keeping appointments and calling the office for reschedule needs. The patient has an upcoming appointment with neurologist but the sister did not have her notebook with that information readily available  12-30-2021: The patients brother Linward Foster ask if the [facility] could assist with transportation for the patients appointment coming up in March. A care guide referral has been placed for assistance with getting transportation for MD visit.        continue to work with Consulting civil engineer and/or Social Worker to address care management and care coordination needs related to HTN, HLD, Dementia, and falls and safety concerns as evidenced by adherence to CM Team Scheduled appointments     work with pharmacist to address Medication procurement and pill packaging system related to HTN, HLD, and Dementia as evidenced by review of EMR and patient or pharmacist report    work with community resource care guide to address needs related to [place] Concerns , Memory Deficits, and care giver resouces  as evidenced by patient and/or community resource care guide support    demonstrate a decrease in HTN, HLD, Dementia, and Falls and safety concern exacerbations  as evidenced by effective  management of chronic conditions and working with the CCM team to optimize health and well being  demonstrate ongoing self health care management ability for effective management of chronic conditions as evidenced by  working with the CCM team through collaboration with Consulting civil engineer, provider, and care team.    Interventions: 1:1 collaboration with primary care provider regarding development and update of comprehensive plan of care as evidenced by provider attestation and co-signature Inter-disciplinary care team collaboration (see longitudinal plan of care) Evaluation of current treatment plan related to  self management and patient's adherence to plan as established by provider     SDOH Barriers (Status: Goal on Track (progressing): YES.) Long Term Goal  Patient interviewed and SDOH assessment performed        Patient interviewed and appropriate assessments performed Provided patient with information about resources in [facility] and care guides available to assist with changes in SDOH, new concerns or needs. 10-12-2021: The patient needs resources for transportation in his area to get to provider appointments. The sister Arrie Aran is interested in talking to the care guides about resources for transportation.  Care guide referral made. 11-03-2021: Resources for transportation are in place. The patient was able to get to his MD appointment without difficulty.  The patients sister is thankful for all the help and support. 12-30-2021: A new referral for transportation, placed today for the patient to be able to get to his appointment to see the pcp on January 24, 2022.  Discussed plans with patient for ongoing care management follow up and provided patient with direct contact information for care management team Advised patient to call  the office for Changes in [place], questions, or concerns Provided education to patient/caregiver regarding level of care options.       Falls:  (Status: Goal on Track  (progressing): YES.) Long Term Goal  Provided written and verbal education re: potential causes of falls and Fall prevention strategies Reviewed medications and discussed potential side effects of medications such as dizziness and frequent urination. 12-30-2021: Denies any issues with medications causing dizziness or other issues. Will continue to monitor.  Advised patient of importance of notifying provider of falls. 12-30-2021: Review of notification of new falls to the provider. Denies any new falls.  Assessed for signs and symptoms of orthostatic hypotension Assessed for falls since last encounter. 09-15-2021: Per the patients sister Arrie Aran the patient walks fast with his head down. The patient had a fall last about 3 to 4 months ago. Was taken to the ED for evaluation. No acute findings. Review of fall precautions and safety concerns. The patient lives with his brother Linward Foster and [place] assist as he can with helping the patient. 11-03-2021: The patients sister states the patient is doing well and has had no new falls. 12-30-2021: The patient nor his brother Linward Foster state the patient has had any new falls, will continue to monitor.  Assessed patients knowledge of fall risk prevention secondary to previously provided education Advised patient to discuss new falls, safety concerns, and changes  with provider Screening for signs and symptoms of depression related to chronic disease state Assessed social determinant of health barriers   Dementia  (Status: Goal on Track (progressing): YES.) Long Term Goal  Evaluation of current treatment plan related to Dementia, Mental Health Concerns , Memory Deficits, and caregiver support  self-management and patient's adherence to plan as established by provider. 09-15-2021: The patients sister gave updated information concerning the patient. She lives in [place] but is in contact frequently with the patients brother Linward Foster who lives with the patient and assist in his care.  The patient has had an episode where he left the home one day around 7 am and was walking and came home about 7 pm. They were getting ready to call authorities when the patient came back home. The patient does not like to drink water, or take a bath and this is a change. The patient also has had an episode where he has urinated on himself and did not realize it. The patient use to draw and play the guitar but these activities he does not like anymore. Extensive education on how memory changes and dementia progress. Offered resources. The patient does have an upcoming appointment with neurology. Ask the patients sister to write down questions to ask the neurologist. Advised the sister how to reach the [facility] and other team members would be calling to work with the patient and family. Advised the sister to not take things the patient may say as personal and to relay this to her brother Annada as well. The sister is very receptive to ideas to help the patient meet his health and wellness needs. Education material to be provided by my chart and the EMMI system. 10-12-2021: The patients sister states she has seen a positive change in the patient since starting on Pravastatin. The patient is sleeping well, not eating as much as he was, and not leaving the house unannounced. She states she is very thankful to Dr. Wynetta Emery and the CCM team for all the help received to help the patient. 11-03-2021: The patient is doing well and appetite is not  as good but the patient is eating still. Offered samples of Ensure for the patient and the sister feels this will be good for the patient. RNCM will arrange for samples to be delivered to the patients home. The patients sister will let her brother Linward Foster know of the plan to have Ensure samples delivered to the home. The patient is not wanting to take a bath. Education on this being a common thing for patients with dementia. Offered suggestions to the patients sister to try to encourage  the patient to have good hygiene practices. The patients brother Linward Foster sometimes gets frustrated with the way the patient acts but the sister is a good support system and helps her brother understand that it is the disease process not the patient. The sister is thankful for ongoing support and education. 11-16-2021: Took samples of Ensure to the patient home and hand delivered the Ensure to the patients brother [place]. Identified correct person and announced the reason for the visit. The patients brother received the Ensure samples, coupons, and contact information for the [facility]. No new findings today. Will continue to monitor. 12-30-2021: The patient endorses eating well and was able to tolerate drinking the Ensure. The patient states he was actually drinking an ensure at the time of the call. The patients brother was on the call with the patient. Denies any acute changes at this time. Review of upcoming appointment with the pcp and answered questions about transportation and gave the patients brother the direct contact information for the [facility]. Will continue to monitor for changes or needs.  Discussed plans with patient for ongoing care management follow up and provided patient with direct contact information for care management team Advised patient to call the office for changes in mood, anxiety, depression, or acute memory changes ; Provided education to patient re: The book: The 36-hour day a resources to help caregivers with patients who have dementia.  Also discussed changes to look for as progression of dementia; Reviewed medications with patient and discussed compliance with medications. The patient has been taking aricept but the patients sister states they have not seen changes present. 10-12-2021: The patients sister has seen a positive change with the addition to Aricept to his medications regimen. Education on getting a follow up appointment rescheduled with the pcp due to unable to attend  appointment on 10-12-2021 at the pcp office. 11-03-2021: The patients sister talked to the pharm D last week about pill packaging system and wants to get a 90 day supply of the patients medications this way. She has contact information for the pharm D.   Social Work referral for ongoing support and education for patient with dementia, resouces, and care giver support. 11-03-2021: The patients sister is working with the LCSW for ongoing support and education. 12-30-2021: Ongoing support and education provided to the patient and family from the LCSW.  Pharmacy referral for the patients sister interested in the pill packaging system so the patient can remain as independent as possible with taking medications. 11-03-2021: The patients sister is working with the pharm D for support and pill packaging system  ; Discussed plans with patient for ongoing care management follow up and provided patient with direct contact information for care management team; Advised patient to discuss changes in memory, activites the patient is doing, and write down questions to discuss with provider; Screening for signs and symptoms of depression related to chronic disease state;  Assessed social determinant of health barriers;    Hyperlipidemia:  (Status:  Goal on Track (progressing): YES.) Long Term Goal       Lab Results  Component Value Date    CHOL 159 09/10/2021    HDL 60 09/10/2021    LDLCALC 87 09/10/2021    TRIG 59 09/10/2021      Medication review performed; medication list updated in electronic medical record. 12-30-2021: The patient is taking as prescribed  Provider established cholesterol goals reviewed; Counseled on importance of regular laboratory monitoring as prescribed. 12-30-2021: Review of lab work. Patient has regular lab work; Provided HLD Scientist, clinical (histocompatibility and immunogenetics); Reviewed role and benefits of statin for ASCVD risk reduction; Reviewed importance of limiting foods high in cholesterol. 11-16-2021:  The  patient is eating well. Provided samples of Ensure for the patient to have additional nutritional drinks with coupons. Will continue to monitor for additional needs for more Ensure samples. 12-30-2021: Will provide samples to the patient at his next visit with pcp.  Reviewed exercise goals and target of 150 minutes per week;   Hypertension: (Status: Goal on Track (progressing): YES.) Last practice recorded BP readings:     BP Readings from Last 3 Encounters:  10/22/21 127/85  09/10/21 140/82  08/19/21 (!) 160/100  Most recent eGFR/CrCl:       Lab Results  Component Value Date    EGFR 67 09/10/2021    No components found for: CRCL   Evaluation of current treatment plan related to hypertension self management and patient's adherence to plan as established by provider. 11-03-2021: Review of last readings for blood pressures of 127/85. The patient is doing well. No issues related to blood pressure or heart health. 12-30-2021: No new concerns with HTN or heart health. Will continue to monitor. ;   Provided education to patient re: stroke prevention, s/s of heart attack and stroke; Reviewed prescribed diet heart healthy diet and review of not adding sodium to food due to the patient with swelling noted in feet. 11-16-2021: Provided samples to the patients brother Linward Foster of Ensure for the patient to use.  Reviewed medications with patient and discussed importance of compliance. 12-30-2021: States compliance with medications  Counseled on adverse effects of illicit drug and excessive alcohol use in patients with high blood pressure;  Discussed plans with patient for ongoing care management follow up and provided patient with direct contact information for care management team; Advised patient, providing education and rationale, to monitor blood pressure daily and record, calling PCP for findings outside established parameters;  Advised patient to discuss blood pressure trends  with provider; Provided  education on prescribed diet heart healthy diet through [place] system. 11-03-2021: The patients sister states her brother Linward Foster prepares meals for the patient and makes sure he eats well. Denies any issues with heart healthy diet . 12-30-2021: Spoke with the patient and the patients brother today. The patient states he is eating good and his brother cooks good food for him.   Discussed complications of poorly controlled blood pressure such as heart disease, stroke, circulatory complications, vision complications, kidney impairment, sexual dysfunction;    Patient Goals/Self-Care Activities: Patient will self administer medications as prescribed as evidenced by self report/primary caregiver report  Patient will attend all scheduled provider appointments as evidenced by clinician review of documented attendance to scheduled appointments and patient/caregiver report Patient will call pharmacy for medication refills as evidenced by patient report and review of pharmacy fill history as appropriate Patient will attend church or other social activities as evidenced by patient report Patient will call provider office for new  concerns or questions as evidenced by review of documented incoming telephone call notes and patient report Patient will work with BSW to address care coordination needs and will continue to work with the clinical team to address health care and disease management related needs as evidenced by documented adherence to scheduled care management/care coordination appointments - check blood pressure weekly - choose a place to take my blood pressure (home, clinic or office, retail store) - write blood pressure results in a log or diary - learn about high blood pressure - keep a blood pressure log - take blood pressure log to all doctor appointments - call doctor for signs and symptoms of high blood pressure - develop an action plan for high blood pressure - keep all doctor  appointments-10-12-2021: The patient was unable to keep his appointment in the office today due to transportation needs. The patients sister wants to reschedule. Advised the patients sister to call the office to reschedule appointment with the pcp. Will continue to monitor.  - take medications for blood pressure exactly as prescribed - report new symptoms to your doctor - eat more whole grains, fruits and vegetables, lean meats and healthy fats - call for medicine refill 2 or 3 days before it runs out - take all medications exactly as prescribed - call doctor with any symptoms you believe are related to your medicine - call doctor when you experience any new symptoms - go to all doctor appointments as scheduled - adhere to prescribed diet: Heart Healthy      Our next appointment is by telephone on 02-04-2022 at 145 pm  Please call the care guide team at [phone] if you need to cancel or reschedule your appointment.   If you are experiencing a Mental Health or [place] or need someone to talk to, please call the Suicide and Crisis Lifeline: 988 call the [place] National Suicide Prevention Lifeline: (743) 477-0049 or TTY: 801-538-8554 TTY (819)042-5580) to talk to a trained counselor call 1-800-273-TALK (toll free, 24 hour hotline)   Patient verbalizes understanding of instructions and care plan provided today and agrees to view in [place]. Active MyChart status confirmed with patient.    Noreene Larsson RN, MSN, [facility] Mobile: [phone]

## 2021-12-31 ENCOUNTER — Telehealth: Payer: Self-pay | Admitting: Licensed Clinical Social Worker

## 2021-12-31 ENCOUNTER — Telehealth: Payer: Self-pay

## 2021-12-31 NOTE — Telephone Encounter (Signed)
° °   Clinical Social Work  Care Management   Phone Outreach    12/31/2021 Name: [name] MRN: [id number] DOB: July 21, 1946  Ronnie Bullock is a 76 y.o. year old male who is a primary care patient of Wynetta Emery, Barb Merino, DO .   Reason for referral: [employer] , Mental Health Counseling and Resources, and Caregiver Stress.    F/U phone call today to assess needs, progress and barriers with care plan goals.   Telephone outreach was unsuccessful. A HIPPA compliant phone message was left for the patient providing contact information and requesting a return call.   Plan:CCM LCSW will wait for return call. If no return call is received, Will route chart to Care Guide to see if patient would like to reschedule phone appointment   Review of patient status, including review of consultants reports, relevant laboratory and other test results, and collaboration with appropriate care team members and the patient's provider was performed as part of comprehensive patient evaluation and provision of care management services.    Christa See, [email or web address], [email or web address] Phone [phone] 9:35 AM

## 2021-12-31 NOTE — Telephone Encounter (Signed)
° °  Telephone encounter was:  Successful.  12/31/2021 Name: Ronnie Bullock MRN: [id number] DOB: 1946-04-12  [name] is a 76 y.o. year old male who is a primary care patient of [name], DO . The community resource team was consulted for assistance with Transportation Needs   Care guide performed the following interventions: Patient provided with information about care guide support team and interviewed to confirm resource needs.Patient has transportation with his insurance and they will call and set up appointments when closer to the date  Follow Up Plan:  no follow up needed at this time   [place], Care Management  [phone] [address], [place], [place] [address] Phone: [phone] Email: [email or web address]

## 2022-01-04 DIAGNOSIS — F0390 Unspecified dementia without behavioral disturbance: Secondary | ICD-10-CM

## 2022-01-04 DIAGNOSIS — E7800 Pure hypercholesterolemia, unspecified: Secondary | ICD-10-CM | POA: Diagnosis not present

## 2022-01-04 DIAGNOSIS — I25118 Atherosclerotic heart disease of native coronary artery with other forms of angina pectoris: Secondary | ICD-10-CM | POA: Diagnosis not present

## 2022-01-04 DIAGNOSIS — I10 Essential (primary) hypertension: Secondary | ICD-10-CM

## 2022-01-06 ENCOUNTER — Telehealth: Payer: Self-pay

## 2022-01-06 NOTE — Chronic Care Management (AMB) (Signed)
?  Care Management  ? ?Note ? ?01/06/2022 ?Name: Ronnie Bullock MRN: [id number] DOB: 1945/11/17 ? ?Ronnie Bullock is a 76 y.o. year old male who is a primary care patient of [name], DO and is actively engaged with the care management team. I reached out to [name] by phone today to assist with re-scheduling a follow up visit with the Licensed Clinical Social Worker ? ?Follow up plan: ?Unsuccessful telephone outreach attempt made. A HIPAA compliant phone message was left for the patient providing contact information and requesting a return call.  ?The care management team will reach out to the patient again over the next 7 days.  ?If patient returns call to provider office, please advise to call Grayson  at [phone] ? ?Noreene Larsson, RMA ?Care Guide, Embedded Care Coordination ?[place]  Care Management  ?[place], [place] [address] ?Direct Dial: [phone] ?[email or web address] ?[email or web address]: [email or web address]  ? ?

## 2022-01-20 ENCOUNTER — Ambulatory Visit: Payer: Medicare HMO | Admitting: Family Medicine

## 2022-01-24 ENCOUNTER — Ambulatory Visit (INDEPENDENT_AMBULATORY_CARE_PROVIDER_SITE_OTHER): Payer: Medicare HMO | Admitting: Family Medicine

## 2022-01-24 ENCOUNTER — Other Ambulatory Visit: Payer: Self-pay

## 2022-01-24 ENCOUNTER — Encounter: Payer: Self-pay | Admitting: Family Medicine

## 2022-01-24 DIAGNOSIS — I10 Essential (primary) hypertension: Secondary | ICD-10-CM | POA: Diagnosis not present

## 2022-01-24 DIAGNOSIS — R6889 Other general symptoms and signs: Secondary | ICD-10-CM | POA: Diagnosis not present

## 2022-01-24 DIAGNOSIS — F0390 Unspecified dementia without behavioral disturbance: Secondary | ICD-10-CM

## 2022-01-24 DIAGNOSIS — E7800 Pure hypercholesterolemia, unspecified: Secondary | ICD-10-CM | POA: Diagnosis not present

## 2022-01-24 NOTE — Assessment & Plan Note (Signed)
Under good control on current regimen. Continue current regimen. Continue to monitor. Call with any concerns. Refills given. Labs drawn today.   

## 2022-01-24 NOTE — Progress Notes (Signed)
? ?BP 136/88   Pulse 87   Temp 98.2 ?F (36.8 ?C) (Oral)   Ht '5\' 9"'  (1.753 m)   Wt 148 lb 3.2 oz (67.2 kg)   SpO2 96%   BMI 21.89 kg/m?   ? ?Subjective:  ? ? Patient ID: Ronnie Bullock, male    DOB: 1946/02/27, 76 y.o.   MRN: [id number] ? ?HPI: ?Ronnie Bullock is a 76 y.o. male ? ?Chief Complaint  ?Patient presents with  ? Hypertension  ? Hyperlipidemia  ? Dementia  ? ?HYPERTENSION / HYPERLIPIDEMIA ?Satisfied with current treatment? yes ?Duration of hypertension: chronic ?BP monitoring frequency: not checking ?BP medication side effects: no ?Past BP meds: amlodipine ?Duration of hyperlipidemia: chronic ?Cholesterol medication side effects: no ?Cholesterol supplements: none ?Past cholesterol medications: none ?Medication compliance: excellent compliance ?Aspirin: no ?Recent stressors: no ?Recurrent headaches: no ?Visual changes: no ?Palpitations: no ?Dyspnea: no ?Chest pain: no ?Lower extremity edema: no ?Dizzy/lightheaded: no ? ?Tolerating his aricept well. No other concerns.  ? ?Relevant past medical, surgical, family and social history reviewed and updated as indicated. Interim medical history since our last visit reviewed. ?Allergies and medications reviewed and updated. ? ?Review of Systems  ?Constitutional: Negative.   ?Respiratory: Negative.    ?Cardiovascular: Negative.   ?Gastrointestinal: Negative.   ?Musculoskeletal: Negative.   ?Neurological: Negative.   ?Psychiatric/Behavioral: Negative.    ? ?Per HPI unless specifically indicated above ? ?   ?Objective:  ?  ?BP 136/88   Pulse 87   Temp 98.2 ?F (36.8 ?C) (Oral)   Ht '5\' 9"'  (1.753 m)   Wt 148 lb 3.2 oz (67.2 kg)   SpO2 96%   BMI 21.89 kg/m?   ?Wt Readings from Last 3 Encounters:  ?01/24/22 148 lb 3.2 oz (67.2 kg)  ?10/22/21 160 lb 9.6 oz (72.8 kg)  ?09/10/21 166 lb 6.4 oz (75.5 kg)  ?  ?Physical Exam ?Vitals and nursing note reviewed.  ?Constitutional:   ?   General: He is not in acute distress. ?   Appearance: Normal appearance. He is not  ill-appearing, toxic-appearing or diaphoretic.  ?HENT:  ?   Head: Normocephalic and atraumatic.  ?   Right Ear: External ear normal.  ?   Left Ear: External ear normal.  ?   Nose: Nose normal.  ?   Mouth/Throat:  ?   Mouth: Mucous membranes are moist.  ?   Pharynx: Oropharynx is clear.  ?Eyes:  ?   General: No scleral icterus.    ?   Right eye: No discharge.     ?   Left eye: No discharge.  ?   Extraocular Movements: Extraocular movements intact.  ?   Conjunctiva/sclera: Conjunctivae normal.  ?   Pupils: Pupils are equal, round, and reactive to light.  ?Cardiovascular:  ?   Rate and Rhythm: Normal rate and regular rhythm.  ?   Pulses: Normal pulses.  ?   Heart sounds: Normal heart sounds. No murmur heard. ?  No friction rub. No gallop.  ?Pulmonary:  ?   Effort: Pulmonary effort is normal. No respiratory distress.  ?   Breath sounds: Normal breath sounds. No stridor. No wheezing, rhonchi or rales.  ?Chest:  ?   Chest wall: No tenderness.  ?Musculoskeletal:     ?   General: Normal range of motion.  ?   Cervical back: Normal range of motion and neck supple.  ?Skin: ?   General: Skin is warm and dry.  ?   Capillary Refill: Capillary  refill takes less than 2 seconds.  ?   Coloration: Skin is not jaundiced or pale.  ?   Findings: No bruising, erythema, lesion or rash.  ?Neurological:  ?   General: No focal deficit present.  ?   Mental Status: He is alert and oriented to person, place, and time. Mental status is at baseline.  ?Psychiatric:     ?   Mood and Affect: Mood normal.     ?   Behavior: Behavior normal.     ?   Thought Content: Thought content normal.     ?   Judgment: Judgment normal.  ? ? ?Results for orders placed or performed in visit on 09/10/21  ?CBC with Differential/Platelet  ?Result Value Ref Range  ? WBC 4.0 3.4 - 10.8 x10E3/uL  ? RBC 4.99 4.14 - 5.80 x10E6/uL  ? Hemoglobin 14.7 13.0 - 17.7 g/dL  ? Hematocrit 44.3 37.5 - 51.0 %  ? MCV 89 79 - 97 fL  ? MCH 29.5 26.6 - 33.0 pg  ? MCHC 33.2 31.5 - 35.7 g/dL   ? RDW 14.0 11.6 - 15.4 %  ? Platelets 227 150 - 450 x10E3/uL  ? Neutrophils 53 Not Estab. %  ? Lymphs 25 Not Estab. %  ? Monocytes 13 Not Estab. %  ? Eos 8 Not Estab. %  ? Basos 1 Not Estab. %  ? Neutrophils Absolute 2.2 1.4 - 7.0 x10E3/uL  ? Lymphocytes Absolute 1.0 0.7 - 3.1 x10E3/uL  ? Monocytes Absolute 0.5 0.1 - 0.9 x10E3/uL  ? EOS (ABSOLUTE) 0.3 0.0 - 0.4 x10E3/uL  ? Basophils Absolute 0.0 0.0 - 0.2 x10E3/uL  ? Immature Granulocytes 0 Not Estab. %  ? Immature Grans (Abs) 0.0 0.0 - 0.1 x10E3/uL  ?Comprehensive metabolic panel  ?Result Value Ref Range  ? Glucose 93 70 - 99 mg/dL  ? BUN 12 8 - 27 mg/dL  ? Creatinine, Ser 1.14 0.76 - 1.27 mg/dL  ? eGFR 67 >59 mL/min/1.73  ? BUN/Creatinine Ratio 11 10 - 24  ? Sodium 138 134 - 144 mmol/L  ? Potassium 4.0 3.5 - 5.2 mmol/L  ? Chloride 101 96 - 106 mmol/L  ? CO2 24 20 - 29 mmol/L  ? Calcium 9.3 8.6 - 10.2 mg/dL  ? Total Protein 7.1 6.0 - 8.5 g/dL  ? Albumin 3.8 3.7 - 4.7 g/dL  ? Globulin, Total 3.3 1.5 - 4.5 g/dL  ? Albumin/Globulin Ratio 1.2 1.2 - 2.2  ? Bilirubin Total 0.4 0.0 - 1.2 mg/dL  ? Alkaline Phosphatase 93 44 - 121 IU/L  ? AST 18 0 - 40 IU/L  ? ALT 6 0 - 44 IU/L  ?Lipid Panel w/o Chol/HDL Ratio  ?Result Value Ref Range  ? Cholesterol, Total 159 100 - 199 mg/dL  ? Triglycerides 59 0 - 149 mg/dL  ? HDL 60 >39 mg/dL  ? VLDL Cholesterol Cal 12 5 - 40 mg/dL  ? LDL Chol Calc (NIH) 87 0 - 99 mg/dL  ? ?   ?Assessment & Plan:  ? ?Problem List Items Addressed This Visit   ? ?  ? Cardiovascular and Mediastinum  ? Essential hypertension, benign  ?  Under good control on current regimen. Continue current regimen. Continue to monitor. Call with any concerns. Refills given. Labs drawn today. ? ?  ?  ?  ? Nervous and Auditory  ? Dementia without behavioral disturbance ([place])  ?  ? Other  ? Hypercholesteremia  ?  Under good control on current regimen. Continue  current regimen. Continue to monitor. Call with any concerns. Refills given. Labs drawn today. ? ?  ?  ?  ? ?Follow  up plan: ?Return in about 3 months (around 04/26/2022) for physical. ? ? ? ? ? ?

## 2022-01-28 NOTE — Chronic Care Management (AMB) (Signed)
?  Care Management  ? ?Note ? ?01/28/2022 ?Name: AXL RODINO MRN: [id number] DOB: 1945/12/31 ? ?DEAMONTE SAYEGH is a 76 y.o. year old male who is a primary care patient of [name], DO and is actively engaged with the care management team. I reached out to [name] by phone today to assist with re-scheduling a follow up visit with the Licensed Clinical Social Worker ? ?Follow up plan: ?Telephone appointment with care management team member scheduled for:02/16/2022 ? ?Noreene Larsson, RMA ?Care Guide, Embedded Care Coordination ?[facility]  Care [facility]  ?[place], [place] [address] ?Direct Dial: [phone] ?Museum/gallery conservator.[email or web address] ?Website: [email or web address]  ? ?

## 2022-02-04 ENCOUNTER — Telehealth: Payer: Medicare HMO

## 2022-02-04 ENCOUNTER — Telehealth: Payer: Self-pay

## 2022-02-04 NOTE — Telephone Encounter (Signed)
?  Care Management  ? ?Follow Up Note ? ? ?02/04/2022 ?Name: Ronnie Bullock MRN: [id number] DOB: May 23, 1946 ? ? ?Referred by: [name], DO ?Reason for referral : Chronic Care Management (RNCM: Follow up for Chronic Disease Management and Care Coordination Needs ) ? ? ?An unsuccessful telephone outreach was attempted today. The patient was referred to the case management team for assistance with care management and care coordination.  ? ?Follow Up Plan: A HIPPA compliant phone message was left for the patient providing contact information and requesting a return call.  ? ?Noreene Larsson RN, MSN, CCM ?Community Care Coordinator ?[facility] ?[place] ?Mobile: [phone]  ?

## 2022-02-16 ENCOUNTER — Telehealth: Payer: Medicare HMO

## 2022-02-16 ENCOUNTER — Telehealth: Payer: Self-pay | Admitting: Licensed Clinical Social Worker

## 2022-02-16 NOTE — Telephone Encounter (Signed)
? ?   Clinical Social Work  ?Care Management  ? Phone Outreach  ? ? ?02/16/2022 ?Name: Ronnie Bullock MRN: [id number] DOB: [birth year] ? ?Ronnie Bullock is a 76 y.o. year old male who is a primary care patient of [name], DO .  ? ?Reason for referral: Caregiver Stress.   ? ?CCM LCSW placed two F/U phone calls today to assess needs, progress and barriers with care plan goals.   Telephone outreach was unsuccessful. HIPPA compliant phone messages was left for the patient providing contact information and requesting a return call.  ? ?Plan:CCM LCSW will wait for return call. If no return call is received, Will route chart to Care Guide to see if patient would like to reschedule phone appointment  ? ?Review of patient status, including review of consultants reports, relevant laboratory and other test results, and collaboration with appropriate care team members and the patient's provider was performed as part of comprehensive patient evaluation and provision of care management services.   ? ?Christa See, MSW, LCSW ?[facility] ?[facility] ?[email or web address] ?Phone [phone] ?4:07 PM ? ? ? ?  ? ? ? ? ?

## 2022-02-17 ENCOUNTER — Ambulatory Visit (INDEPENDENT_AMBULATORY_CARE_PROVIDER_SITE_OTHER): Payer: Medicare HMO | Admitting: Vascular Surgery

## 2022-03-08 ENCOUNTER — Telehealth: Payer: Self-pay

## 2022-03-08 NOTE — Chronic Care Management (AMB) (Signed)
?  Chronic Care Management ?Note ? ?03/08/2022 ?Name: Ronnie Bullock MRN: [id number] DOB: October 24, 1946 ? ?Ronnie Bullock is a 76 y.o. year old male who is a primary care patient of [name], DO and is actively engaged with the care management team. I reached out to [name] by phone today to assist with re-scheduling a follow up visit with the RN Case Manager ? ?Follow up plan: ?Unsuccessful telephone outreach attempt made. A HIPAA compliant phone message was left for the patient providing contact information and requesting a return call.  ?The care management team will reach out to the patient again over the next 7 days.  ?If patient returns call to provider office, please advise to call [facility]  at [phone] ? ?Noreene Larsson, RMA ?Care Guide, Embedded Care Coordination ?[facility]  Care Management  ?[place],  [address] ?Direct Dial: [phone] ?Museum/gallery conservator.[email or web address] ?Website: [email or web address]  ? ?

## 2022-03-08 NOTE — Chronic Care Management (AMB) (Signed)
?  Chronic Care Management ?Note ? ?03/08/2022 ?Name: Ronnie Bullock MRN: [id number] DOB: 1946-02-23 ? ?Ronnie Bullock is a 76 y.o. year old male who is a primary care patient of [name], DO and is actively engaged with the care management team. I reached out to Ronnie Bullock by phone today to assist with re-scheduling a follow up visit with the RN Case Manager ? ?Follow up plan: ?Patient declines further follow up and engagement by the [facility] . Appropriate care team members and provider have been notified via electronic communication.  ? ?Noreene Larsson, RMA ?Care Guide, Embedded Care Coordination ?[facility]  Care Management  ?[place], [place] [address] ?Direct Dial: [phone] ?Museum/gallery conservator.[email or web address] ?Website: [email or web address]  ? ?

## 2022-03-31 ENCOUNTER — Ambulatory Visit (INDEPENDENT_AMBULATORY_CARE_PROVIDER_SITE_OTHER): Payer: Medicare HMO | Admitting: Vascular Surgery

## 2022-04-13 ENCOUNTER — Other Ambulatory Visit: Payer: Self-pay | Admitting: Family Medicine

## 2022-04-13 NOTE — Telephone Encounter (Signed)
Requested Prescriptions  Pending Prescriptions Disp Refills  . ELIQUIS 5 MG TABS tablet [Pharmacy Med Name: Eliquis 5 mg tablet] 180 tablet 0    Sig: TAKE ONE TABLET BY MOUTH EVERY MORNING and TAKE ONE TABLET BY MOUTH EVERYDAY AT BEDTIME     Hematology:  Anticoagulants - apixaban Passed - 04/13/2022 12:13 PM      Passed - PLT in normal range and within 360 days    Platelets  Date Value Ref Range Status  09/10/2021 227 150 - 450 x10E3/uL Final         Passed - HGB in normal range and within 360 days    Hemoglobin  Date Value Ref Range Status  09/10/2021 14.7 13.0 - 17.7 g/dL Final         Passed - HCT in normal range and within 360 days    Hematocrit  Date Value Ref Range Status  09/10/2021 44.3 37.5 - 51.0 % Final         Passed - Cr in normal range and within 360 days    Creatinine, Ser  Date Value Ref Range Status  09/10/2021 1.14 0.76 - 1.27 mg/dL Final         Passed - AST in normal range and within 360 days    AST  Date Value Ref Range Status  09/10/2021 18 0 - 40 IU/L Final         Passed - ALT in normal range and within 360 days    ALT  Date Value Ref Range Status  09/10/2021 6 0 - 44 IU/L Final         Passed - Valid encounter within last 12 months    Recent Outpatient Visits          2 months ago Dementia without behavioral disturbance ([place])   [place], Megan P, DO   5 months ago Essential hypertension, benign   [facility] [place], Megan P, DO   7 months ago Dementia without behavioral disturbance ([place])   [place], Megan P, DO   11 months ago Seasonal allergic rhinitis due to pollen   [facility], Megan P, DO   1 year ago Routine general medical examination at a health care facility   [place], Barb Merino, DO      Future Appointments            In 1 week Wynetta Emery, Barb Merino, DO [facility] [place]

## 2022-04-21 ENCOUNTER — Ambulatory Visit (INDEPENDENT_AMBULATORY_CARE_PROVIDER_SITE_OTHER): Payer: Medicare HMO | Admitting: Vascular Surgery

## 2022-04-21 NOTE — Progress Notes (Deleted)
MRN : [id number]  Ronnie Bullock is a 76 y.o. ([birth year]) male who presents with chief complaint of legs hurt and swell.  History of Present Illness:   The patient presents to the office for evaluation of DVT.  DVT was identified at [facility] by Duplex ultrasound.  The initial symptoms were pain and swelling in the lower extremity.   The patient notes the leg continues to be very painful with dependency and swells quite a bite.  Symptoms are much better with elevation.  The patient notes minimal edema in the morning which steadily worsens throughout the day.     The patient has not been using compression therapy at this point.   No SOB or pleuritic chest pains.  No cough or hemoptysis.   No blood per rectum or blood in any sputum.  No excessive bruising per the patient.    No outpatient medications have been marked as taking for the 04/21/22 encounter (Appointment) with Delana Meyer, Dolores Lory, MD.    Past Medical History:  Diagnosis Date   Dementia ([place])    Dementia ([place])    Hyperlipidemia    Hypertension    Incontinence     Past Surgical History:  Procedure Laterality Date   COLONOSCOPY WITH PROPOFOL N/A 05/16/2018   Procedure: COLONOSCOPY WITH PROPOFOL;  Surgeon: Virgel Manifold, MD;  Location: [facility] ENDOSCOPY;  Service: Endoscopy;  Laterality: N/A;   PERIPHERAL VASCULAR THROMBECTOMY Left 01/22/2021   Procedure: PERIPHERAL VASCULAR THROMBECTOMY / THROMBOLYSIS;  Surgeon: Katha Cabal, MD;  Location: [facility];  Service: Cardiovascular;  Laterality: Left;    Social History Social History   Tobacco Use   Smoking status: Never   Smokeless tobacco: Never  Vaping Use   Vaping Use: Never used  Substance Use Topics   Alcohol use: No    Alcohol/week: 0.0 standard drinks of alcohol   Drug use: No    Family History Family History  Problem Relation Age of Onset   Hypertension Mother    Prostate cancer Neg Hx    Bladder Cancer Neg Hx    Kidney cancer  Neg Hx     No Known Allergies   REVIEW OF SYSTEMS (Negative unless checked)  Constitutional: '[]'$ Weight loss  '[]'$ Fever  '[]'$ Chills Cardiac: '[]'$ Chest pain   '[]'$ Chest pressure   '[]'$ Palpitations   '[]'$ Shortness of breath when laying flat   '[]'$ Shortness of breath with exertion. Vascular:  '[]'$ Pain in legs with walking   '[x]'$ Pain in legs at rest  '[]'$ History of DVT   '[]'$ Phlebitis   '[x]'$ Swelling in legs   '[]'$ Varicose veins   '[]'$ Non-healing ulcers Pulmonary:   '[]'$ Uses home oxygen   '[]'$ Productive cough   '[]'$ Hemoptysis   '[]'$ Wheeze  '[]'$ COPD   '[]'$ Asthma Neurologic:  '[]'$ Dizziness   '[]'$ Seizures   '[]'$ History of stroke   '[]'$ History of TIA  '[]'$ Aphasia   '[]'$ Vissual changes   '[]'$ Weakness or numbness in arm   '[]'$ Weakness or numbness in leg Musculoskeletal:   '[]'$ Joint swelling   '[]'$ Joint pain   '[]'$ Low back pain Hematologic:  '[]'$ Easy bruising  '[]'$ Easy bleeding   '[]'$ Hypercoagulable state   '[]'$ Anemic Gastrointestinal:  '[]'$ Diarrhea   '[]'$ Vomiting  '[]'$ Gastroesophageal reflux/heartburn   '[]'$ Difficulty swallowing. Genitourinary:  '[]'$ Chronic kidney disease   '[]'$ Difficult urination  '[]'$ Frequent urination   '[]'$ Blood in urine Skin:  '[]'$ Rashes   '[]'$ Ulcers  Psychological:  '[]'$ History of anxiety   '[]'$  History of major depression.  Physical Examination  There were no vitals filed for this visit. There is no height  or weight on file to calculate BMI. Gen: WD/WN, NAD Head: [place]/AT, No temporalis wasting.  Ear/Nose/Throat: Hearing grossly intact, nares w/o erythema or drainage, pinna without lesions Eyes: PER, EOMI, sclera nonicteric.  Neck: Supple, no gross masses.  No JVD.  Pulmonary:  Good air movement, no audible wheezing, no use of accessory muscles.  Cardiac: RRR, precordium not hyperdynamic. Vascular:  scattered varicosities present bilaterally.  Moderate venous stasis changes to the legs bilaterally.  2+ soft pitting edema  Vessel Right Left  Radial Palpable Palpable  Gastrointestinal: soft, non-distended. No guarding/no peritoneal signs.  Musculoskeletal: M/S 5/5  throughout.  No deformity.  Neurologic: CN 2-12 intact. Pain and light touch intact in extremities.  Symmetrical.  Speech is fluent. Motor exam as listed above. Psychiatric: Judgment intact, Mood & affect appropriate for pt's clinical situation. Dermatologic: Venous rashes no ulcers noted.  No changes consistent with cellulitis. Lymph : No lichenification or skin changes of chronic lymphedema.  CBC Lab Results  Component Value Date   WBC 4.0 09/10/2021   HGB 14.7 09/10/2021   HCT 44.3 09/10/2021   MCV 89 09/10/2021   PLT 227 09/10/2021    BMET    Component Value Date/Time   NA 138 09/10/2021 1106   K 4.0 09/10/2021 1106   CL 101 09/10/2021 1106   CO2 24 09/10/2021 1106   GLUCOSE 93 09/10/2021 1106   GLUCOSE 105 (H) 02/16/2021 0549   BUN 12 09/10/2021 1106   CREATININE 1.14 09/10/2021 1106   CALCIUM 9.3 09/10/2021 1106   GFRNONAA 58 (L) 02/16/2021 0549   GFRAA 84 09/10/2020 1441   CrCl cannot be calculated (Patient's most recent lab result is older than the maximum 21 days allowed.).  COAG Lab Results  Component Value Date   INR 1.2 01/20/2021   INR 1.0 12/02/2020    Radiology No results found.   Assessment/Plan There are no diagnoses linked to this encounter.   Hortencia Pilar, MD  04/21/2022 11:07 AM

## 2022-04-26 ENCOUNTER — Ambulatory Visit: Payer: Medicare HMO | Admitting: Family Medicine

## 2022-07-06 ENCOUNTER — Other Ambulatory Visit: Payer: Self-pay | Admitting: Family Medicine

## 2022-07-06 DIAGNOSIS — R339 Retention of urine, unspecified: Secondary | ICD-10-CM

## 2022-07-07 ENCOUNTER — Telehealth: Payer: Self-pay

## 2022-07-07 NOTE — Telephone Encounter (Signed)
Patient is overdue for an appointment. Please call to schedule then route to provider for refill.

## 2022-07-07 NOTE — Progress Notes (Signed)
    Chronic Care Management Pharmacy Assistant   Name: Ronnie Bullock  MRN: [id number] DOB: 12/06/1945   Medications: Outpatient Encounter Medications as of 07/07/2022  Medication Sig   amLODipine (NORVASC) 5 MG tablet Take 1 tablet (5 mg total) by mouth daily.   donepezil (ARICEPT) 5 MG tablet Take 1 tablet (5 mg total) by mouth at bedtime.   ELIQUIS 5 MG TABS tablet TAKE ONE TABLET BY MOUTH EVERY MORNING and TAKE ONE TABLET BY MOUTH EVERYDAY AT BEDTIME   fluticasone (FLONASE) 50 MCG/ACT nasal spray Place 2 sprays into both nostrils daily.   Multiple Vitamin (MULTIVITAMIN WITH MINERALS) TABS tablet Take 1 tablet by mouth daily.   tamsulosin (FLOMAX) 0.4 MG CAPS capsule Take 1 capsule (0.4 mg total) by mouth daily.   No facility-administered encounter medications on file as of 07/07/2022.    BP Readings from Last 3 Encounters:  01/24/22 136/88  10/22/21 127/85  09/10/21 140/82    No results found for: "HGBA1C"   Reviewed chart for medication changes ahead of medication coordination call.  No OVs, Consults, or hospital visits since last care coordination call/Pharmacist visit.   No medication changes indicated.  Patient obtains medications through Adherence Packaging  90 Days   Last adherence delivery included:  Fluticasone nasal spray 50 mcg 2 sprays each nostril Donepezil 5 mg 1 tab at bedtime Amlodipine 5 mg 1 tab at breakfast Tamsulosin 0.4 mg 1 tab at bedtime Eliquis 5 mg 1 tab at breakfast and 1 tab at bedtime  Patient is due for next adherence delivery on: 07/20/22. Called patient and reviewed medications and coordinated delivery.  This delivery to include: Fluticasone nasal spray 50 mcg 2 sprays each nostril Donepezil 5 mg 1 tab at bedtime Amlodipine 5 mg 1 tab at breakfast Tamsulosin 0.4 mg 1 tab at bedtime Eliquis 5 mg 1 tab at breakfast and 1 tab at bedtime    Patient needs refills for refill sent to pcp.Refills have been requested from  [facility].  Donepezil 5 mg 1 tab at bedtime Amlodipine 5 mg 1 tab at breakfast Tamsulosin 0.4 mg 1 tab at bedtime Eliquis 5 mg 1 tab at breakfast and 1 tab at bedtime  Confirmed delivery date of 07/20/22, advised patient that pharmacy will contact them the morning of delivery.    Care Gaps: Colonoscopy-05/16/18 Diabetic Foot Exam-NA Ophthalmology-NA Dexa Scan - NA Annual Well Visit - NA Micro albumin-NA Hemoglobin A1c- NA  Star Rating Drugs: None ID  [facility] Pharmacist Assistant [phone]

## 2022-07-07 NOTE — Telephone Encounter (Signed)
LVM on number on file asking them to call back to schedule an appointment

## 2022-07-07 NOTE — Telephone Encounter (Signed)
Requested Prescriptions  Pending Prescriptions Disp Refills  . fluticasone (FLONASE) 50 MCG/ACT nasal spray [Pharmacy Med Name: fluticasone propionate 50 mcg/actuation nasal spray,suspension] 16 g 2    Sig: PLACE TWO SPRAYS IN EACH NOSTRIL DAILY     Ear, Nose, and Throat: Nasal Preparations - Corticosteroids Passed - 07/06/2022  1:53 PM      Passed - Valid encounter within last 12 months    Recent Outpatient Visits          5 months ago Dementia without behavioral disturbance ([place])   [place], Megan P, DO   8 months ago Essential hypertension, benign   [employer], Megan P, DO   10 months ago Dementia without behavioral disturbance ([place])   [place], Megan P, DO   1 year ago Seasonal allergic rhinitis due to pollen   [facility], Megan P, DO   1 year ago Routine general medical examination at a health care facility   [facility], Megan P, DO             . amLODipine (NORVASC) 5 MG tablet [Pharmacy Med Name: amlodipine 5 mg tablet] 90 tablet 0    Sig: TAKE ONE TABLET BY MOUTH EVERY MORNING     Cardiovascular: Calcium Channel Blockers 2 Passed - 07/06/2022  1:53 PM      Passed - Last BP in normal range    BP Readings from Last 1 Encounters:  01/24/22 136/88         Passed - Last Heart Rate in normal range    Pulse Readings from Last 1 Encounters:  01/24/22 87         Passed - Valid encounter within last 6 months    Recent Outpatient Visits          5 months ago Dementia without behavioral disturbance ([place])   [place], Megan P, DO   8 months ago Essential hypertension, benign   [employer], Megan P, DO   10 months ago Dementia without behavioral disturbance ([place])   [place], [place] P, DO   1 year ago Seasonal allergic rhinitis due to pollen   [facility], Megan P, DO   1  year ago Routine general medical examination at a health care facility   [facility], Megan P, DO             . donepezil (ARICEPT) 5 MG tablet [Pharmacy Med Name: donepezil 5 mg tablet] 90 tablet     Sig: TAKE ONE TABLET BY MOUTH EVERYDAY AT BEDTIME     Neurology:  Alzheimer's Agents Passed - 07/06/2022  1:53 PM      Passed - Valid encounter within last 6 months    Recent Outpatient Visits          5 months ago Dementia without behavioral disturbance ([place])   [place], Megan P, DO   8 months ago Essential hypertension, benign   [place], Megan P, DO   10 months ago Dementia without behavioral disturbance ([place])   [place], [place], DO   1 year ago Seasonal allergic rhinitis due to pollen   [place], DO   1 year ago Routine general medical examination at a health care facility   [facility], [place], DO             .  tamsulosin (FLOMAX) 0.4 MG CAPS capsule [Pharmacy Med Name: tamsulosin 0.4 mg capsule] 90 capsule 0    Sig: TAKE ONE CAPSULE BY MOUTH EVERYDAY AT BEDTIME     Urology: Alpha-Adrenergic Blocker Failed - 07/06/2022  1:53 PM      Failed - PSA in normal range and within 360 days    Prostate Specific Ag, Serum  Date Value Ref Range Status  04/02/2021 0.2 0.0 - 4.0 ng/mL Final    Comment:    Roche ECLIA methodology. According to the American Urological Association, Serum PSA should decrease and remain at undetectable levels after radical prostatectomy. The AUA defines biochemical recurrence as an initial PSA value 0.2 ng/mL or greater followed by a subsequent confirmatory PSA value 0.2 ng/mL or greater. Values obtained with different assay methods or kits cannot be used interchangeably. Results cannot be interpreted as absolute evidence of the presence or absence of malignant disease.          Passed - Last BP in  normal range    BP Readings from Last 1 Encounters:  01/24/22 136/88         Passed - Valid encounter within last 12 months    Recent Outpatient Visits          5 months ago Dementia without behavioral disturbance ([place])   [place], Megan P, DO   8 months ago Essential hypertension, benign   [place], Megan P, DO   10 months ago Dementia without behavioral disturbance ([place])   [place], Megan P, DO   1 year ago Seasonal allergic rhinitis due to pollen   [facility], Megan P, DO   1 year ago Routine general medical examination at a health care facility   [facility], Megan P, DO             . ELIQUIS 5 MG TABS tablet [Pharmacy Med Name: Eliquis 5 mg tablet] 180 tablet 0    Sig: TAKE ONE TABLET BY MOUTH AT BREAKFAST AND AT BEDTIME     Hematology:  Anticoagulants - apixaban Passed - 07/06/2022  1:53 PM      Passed - PLT in normal range and within 360 days    Platelets  Date Value Ref Range Status  09/10/2021 227 150 - 450 x10E3/uL Final         Passed - HGB in normal range and within 360 days    Hemoglobin  Date Value Ref Range Status  09/10/2021 14.7 13.0 - 17.7 g/dL Final         Passed - HCT in normal range and within 360 days    Hematocrit  Date Value Ref Range Status  09/10/2021 44.3 37.5 - 51.0 % Final         Passed - Cr in normal range and within 360 days    Creatinine, Ser  Date Value Ref Range Status  09/10/2021 1.14 0.76 - 1.27 mg/dL Final         Passed - AST in normal range and within 360 days    AST  Date Value Ref Range Status  09/10/2021 18 0 - 40 IU/L Final         Passed - ALT in normal range and within 360 days    ALT  Date Value Ref Range Status  09/10/2021 6 0 - 44 IU/L Final         Passed - Valid encounter within last 12 months  Recent Outpatient Visits          5 months ago Dementia without behavioral disturbance  ([place])   [place], Megan P, DO   8 months ago Essential hypertension, benign   [place], Megan P, DO   10 months ago Dementia without behavioral disturbance [facility])   [place], Megan P, DO   1 year ago Seasonal allergic rhinitis due to pollen   [place], DO   1 year ago Routine general medical examination at a health care facility   [facility], [place] P, DO

## 2022-07-07 NOTE — Telephone Encounter (Signed)
Requested medication (s) are due for refill today: yes  Requested medication (s) are on the active medication list: yes  Last refill:  10/25/21 #90 1 RF  Future visit scheduled: no  Notes to clinic:  overdue lab work   Requested Prescriptions  Pending Prescriptions Disp Refills   tamsulosin (FLOMAX) 0.4 MG CAPS capsule [Pharmacy Med Name: tamsulosin 0.4 mg capsule] 90 capsule     Sig: TAKE ONE CAPSULE BY MOUTH EVERYDAY AT BEDTIME     Urology: Alpha-Adrenergic Blocker Failed - 07/06/2022  1:53 PM      Failed - PSA in normal range and within 360 days    Prostate Specific Ag, Serum  Date Value Ref Range Status  04/02/2021 0.2 0.0 - 4.0 ng/mL Final    Comment:    Roche ECLIA methodology. According to the American Urological Association, Serum PSA should decrease and remain at undetectable levels after radical prostatectomy. The AUA defines biochemical recurrence as an initial PSA value 0.2 ng/mL or greater followed by a subsequent confirmatory PSA value 0.2 ng/mL or greater. Values obtained with different assay methods or kits cannot be used interchangeably. Results cannot be interpreted as absolute evidence of the presence or absence of malignant disease.          Passed - Last BP in normal range    BP Readings from Last 1 Encounters:  01/24/22 136/88         Passed - Valid encounter within last 12 months    Recent Outpatient Visits           5 months ago Dementia without behavioral disturbance ([place])   [place], Megan P, DO   8 months ago Essential hypertension, benign   [employer], Megan P, DO   10 months ago Dementia without behavioral disturbance [facility])   [place], Megan P, DO   1 year ago Seasonal allergic rhinitis due to pollen   [facility], Megan P, DO   1 year ago Routine general medical examination at a health care facility   [facility] [place] P, DO               Signed Prescriptions Disp Refills   fluticasone (FLONASE) 50 MCG/ACT nasal spray 16 g 2    Sig: PLACE TWO SPRAYS IN EACH NOSTRIL DAILY     Ear, Nose, and Throat: Nasal Preparations - Corticosteroids Passed - 07/06/2022  1:53 PM      Passed - Valid encounter within last 12 months    Recent Outpatient Visits           5 months ago Dementia without behavioral disturbance ([place])   [place], Megan P, DO   8 months ago Essential hypertension, benign   [facility] [place], Megan P, DO   10 months ago Dementia without behavioral disturbance ([place])   [place], Megan P, DO   1 year ago Seasonal allergic rhinitis due to pollen   [facility], Megan P, DO   1 year ago Routine general medical examination at a health care facility   [facility], Megan P, DO               amLODipine (NORVASC) 5 MG tablet 90 tablet 0    Sig: TAKE ONE TABLET BY MOUTH EVERY MORNING     Cardiovascular: Calcium Channel Blockers 2 Passed - 07/06/2022  1:53 PM  Passed - Last BP in normal range    BP Readings from Last 1 Encounters:  01/24/22 136/88         Passed - Last Heart Rate in normal range    Pulse Readings from Last 1 Encounters:  01/24/22 87         Passed - Valid encounter within last 6 months    Recent Outpatient Visits           5 months ago Dementia without behavioral disturbance ([place])   [place], Megan P, DO   8 months ago Essential hypertension, benign   [employer], Megan P, DO   10 months ago Dementia without behavioral disturbance ([place])   [place], Megan P, DO   1 year ago Seasonal allergic rhinitis due to pollen   [facility], Megan P, DO   1 year ago Routine general medical examination at a health care facility   [facility], Megan P, DO                donepezil (ARICEPT) 5 MG tablet 90 tablet 0    Sig: TAKE ONE TABLET BY MOUTH EVERYDAY AT BEDTIME     Neurology:  Alzheimer's Agents Passed - 07/06/2022  1:53 PM      Passed - Valid encounter within last 6 months    Recent Outpatient Visits           5 months ago Dementia without behavioral disturbance ([place])   [place], Megan P, DO   8 months ago Essential hypertension, benign   [place], Megan P, DO   10 months ago Dementia without behavioral disturbance ([place])   [place], Megan P, DO   1 year ago Seasonal allergic rhinitis due to pollen   [facility], Megan P, DO   1 year ago Routine general medical examination at a health care facility   [facility] Megan P, DO               ELIQUIS 5 MG TABS tablet 180 tablet 0    Sig: TAKE ONE TABLET BY MOUTH AT BREAKFAST AND AT BEDTIME     Hematology:  Anticoagulants - apixaban Passed - 07/06/2022  1:53 PM      Passed - PLT in normal range and within 360 days    Platelets  Date Value Ref Range Status  09/10/2021 227 150 - 450 x10E3/uL Final         Passed - HGB in normal range and within 360 days    Hemoglobin  Date Value Ref Range Status  09/10/2021 14.7 13.0 - 17.7 g/dL Final         Passed - HCT in normal range and within 360 days    Hematocrit  Date Value Ref Range Status  09/10/2021 44.3 37.5 - 51.0 % Final         Passed - Cr in normal range and within 360 days    Creatinine, Ser  Date Value Ref Range Status  09/10/2021 1.14 0.76 - 1.27 mg/dL Final         Passed - AST in normal range and within 360 days    AST  Date Value Ref Range Status  09/10/2021 18 0 - 40 IU/L Final         Passed - ALT in normal range and within 360 days    ALT  Date Value Ref Range Status  09/10/2021 6 0 - 44 IU/L Final         Passed - Valid encounter within last 12 months    Recent Outpatient Visits            5 months ago Dementia without behavioral disturbance ([place])   [place], Megan P, DO   8 months ago Essential hypertension, benign   [place], Megan P, DO   10 months ago Dementia without behavioral disturbance [facility])   [place], Megan P, DO   1 year ago Seasonal allergic rhinitis due to pollen   [place], DO   1 year ago Routine general medical examination at a health care facility   [facility], [place] P, DO

## 2022-07-08 ENCOUNTER — Telehealth: Payer: Self-pay

## 2022-07-08 NOTE — Telephone Encounter (Signed)
Patient has appointment on 9/6

## 2022-07-08 NOTE — Telephone Encounter (Unsigned)
Copied from [place] [id number]. Topic: General - Other >> Jul 08, 2022  2:07 PM Shiquita J wrote: Reason for CRM: pt has an appt on 7/6. Pt would like assistance with getting transportation set up. Pt tried through his insurance but they will not cover. Pt's brother Linward Foster is his caretaker and will be at his appt with him.   CB: [phone]- please assist further.

## 2022-07-12 ENCOUNTER — Telehealth: Payer: Self-pay

## 2022-07-12 DIAGNOSIS — Z5982 Transportation insecurity: Secondary | ICD-10-CM

## 2022-07-12 NOTE — Telephone Encounter (Signed)
Referral for help with transportation placed

## 2022-07-12 NOTE — Addendum Note (Signed)
Addended by: [name] on: 07/12/2022 01:59 PM   Modules accepted: Orders

## 2022-07-12 NOTE — Telephone Encounter (Signed)
Copied from [place] [id number]. Topic: General - Other >> Jul 08, 2022  2:07 PM Shiquita J wrote: Reason for CRM: pt has an appt on 7/6. Pt would like assistance with getting transportation set up. Pt tried through his insurance but they will not cover. Pt's brother Linward Foster is his caretaker and will be at his appt with him.   CB: [phone]- please assist further. >> Jul 12, 2022 11:48 AM Penni Bombard wrote: Pt's caregiver / brother / [facility] called to see if transportation has been set up yet for his appt.  CB@  [phone]

## 2022-07-12 NOTE — Telephone Encounter (Signed)
Does not need help with transportation. Has [employer]. Referral cancelled

## 2022-07-13 ENCOUNTER — Ambulatory Visit: Payer: Self-pay | Admitting: *Deleted

## 2022-07-13 ENCOUNTER — Telehealth: Payer: Self-pay | Admitting: *Deleted

## 2022-07-13 ENCOUNTER — Ambulatory Visit: Payer: Medicare HMO | Admitting: Family Medicine

## 2022-07-13 NOTE — Patient Outreach (Signed)
  Care Coordination   07/13/2022 Name: Ronnie Bullock MRN: [id number] DOB: [birth year]   Care Coordination Outreach Attempts:  An unsuccessful call made to patient's brother to confirm assistance needs with setting up transportation Follow Up Plan:  Additional outreach attempts will be made to offer the patient care coordination information and services.   Encounter Outcome:  No Answer  Care Coordination Interventions Activated:  No   Care Coordination Interventions:  No, not indicated    Froylan Hobby, [place] Worker  [facility] Care Management [phone]

## 2022-07-13 NOTE — Patient Outreach (Signed)
Phone call to patient's brother to confirm transportation needs. Per patient's brother he will utilize patient's transportation benefit through his health plan. Patient's brother aware of the requirement to contact transportation within 3 days of an appointment and is requesting that future appointments be scheduled with this window in mind.  Patient's brother verbalized having no additional community resource needs at this time.   Elliot Gurney, Brenda Worker  [facility] Care Management [phone]

## 2022-08-02 ENCOUNTER — Ambulatory Visit (INDEPENDENT_AMBULATORY_CARE_PROVIDER_SITE_OTHER): Payer: Medicare HMO | Admitting: *Deleted

## 2022-08-02 ENCOUNTER — Ambulatory Visit: Payer: Self-pay | Admitting: *Deleted

## 2022-08-02 ENCOUNTER — Telehealth: Payer: Self-pay

## 2022-08-02 DIAGNOSIS — Z0000 Encounter for general adult medical examination without abnormal findings: Secondary | ICD-10-CM

## 2022-08-02 DIAGNOSIS — I2510 Atherosclerotic heart disease of native coronary artery without angina pectoris: Secondary | ICD-10-CM

## 2022-08-02 NOTE — Telephone Encounter (Signed)
   Telephone encounter was:  Successful.  08/02/2022 Name: Ronnie Bullock MRN: [id number] DOB: 04-06-1946  Ronnie Bullock is a 76 y.o. year old male who is a primary care patient of [name], DO . The community resource team was consulted for assistance with Financial Difficulties related to bills  Care guide performed the following interventions: Patient provided with information about care guide support team and interviewed to confirm resource needs.  Follow Up Plan:  Care guide will outreach resources to assist patient with resources for bills and repair discounts if any.  Mobile management  [address], [place]  Main Phone: [phone]  [email or web address]: [email or web address]  Website: [email or web address]

## 2022-08-02 NOTE — Progress Notes (Signed)
Subjective:   Ronnie Bullock is a 76 y.o. male who presents for Medicare Annual/Subsequent preventive examination.  I connected with  Ronnie Bullock on 08/02/22 by a telephone enabled telemedicine application and verified that I am speaking with the correct person using two identifiers.   I discussed the limitations of evaluation and management by telemedicine. The patient expressed understanding and agreed to proceed.  Patient location: home  Provider location: Tele-health-home    Review of Systems           Objective:    Today's Vitals   There is no height or weight on file to calculate BMI.     08/02/2022   12:56 PM 02/12/2021    4:50 PM 01/20/2021   10:37 AM 08/11/2020    1:48 PM 02/12/2020    2:27 PM 12/12/2019    1:09 PM 11/13/2019    1:58 PM  Advanced Directives  Does Patient Have a Medical Advance Directive? No Yes Yes No No Yes No  Type of Corporate treasurer of [place];Living will [place];Living will   Living will;Healthcare Power of Attorney   Does patient want to make changes to medical advance directive?   No - Patient declined      Copy of [place] in Chart?  No - copy requested No - copy requested   No - copy requested   Would patient like information on creating a medical advance directive? No - Patient declined No - Patient declined No - Patient declined No - Patient declined No - Patient declined  No - Patient declined    Current Medications (verified) Outpatient Encounter Medications as of 08/02/2022  Medication Sig   amLODipine (NORVASC) 5 MG tablet TAKE ONE TABLET BY MOUTH EVERY MORNING   donepezil (ARICEPT) 5 MG tablet TAKE ONE TABLET BY MOUTH EVERYDAY AT BEDTIME   ELIQUIS 5 MG TABS tablet TAKE ONE TABLET BY MOUTH AT BREAKFAST AND AT BEDTIME   fluticasone (FLONASE) 50 MCG/ACT nasal spray PLACE TWO SPRAYS IN EACH NOSTRIL DAILY   Multiple Vitamin (MULTIVITAMIN WITH MINERALS) TABS tablet Take 1  tablet by mouth daily.   tamsulosin (FLOMAX) 0.4 MG CAPS capsule TAKE ONE CAPSULE BY MOUTH EVERYDAY AT BEDTIME   No facility-administered encounter medications on file as of 08/02/2022.    Allergies (verified) Patient has no known allergies.   History: Past Medical History:  Diagnosis Date   Dementia ([place])    Dementia ([place])    Hyperlipidemia    Hypertension    Incontinence    Past Surgical History:  Procedure Laterality Date   COLONOSCOPY WITH PROPOFOL N/A 05/16/2018   Procedure: COLONOSCOPY WITH PROPOFOL;  Surgeon: Virgel Manifold, MD;  Location: [facility] END[facility]COPY;  Service: Endoscopy;  Laterality: N/A;   PERIPHERAL VASCULAR THROMBECTOMY Left 01/22/2021   Procedure: PERIPHERAL VASCULAR THROMBECTOMY / THROMBOLYSIS;  Surgeon: Katha Cabal, MD;  Location: [facility];  Service: Cardiovascular;  Laterality: Left;   Family History  Problem Relation Age of Onset   Hypertension Mother    Prostate cancer Neg Hx    Bladder Cancer Neg Hx    Kidney cancer Neg Hx    Social History   Socioeconomic History   Marital status: Widowed    Spouse name: Not on file   Number of children: Not on file   Years of education: Not on file   Highest education level: 12th grade  Occupational History   Not on file  Tobacco  Use   Smoking status: Never   Smokeless tobacco: Never  Vaping Use   Vaping Use: Never used  Substance and Sexual Activity   Alcohol use: No    Alcohol/week: 0.0 standard drinks of alcohol   Drug use: No   Sexual activity: Yes  Other Topics Concern   Not on file  Social History Narrative   Not on file   Social Determinants of Health   Financial Resource Strain: High Risk (08/02/2022)   Overall Financial Resource Strain (CARDIA)    Difficulty of Paying Living Expenses: Hard  Food Insecurity: No Food Insecurity (07/27/2021)   Hunger Vital Sign    Worried About Running Out of Food in the Last Year: Never true    Ran Out of Food in the Last Year: Never  true  Transportation Needs: Unmet Transportation Needs (08/02/2022)   PRAPARE - Hydrologist (Medical): Yes    Lack of Transportation (Non-Medical): No  Physical Activity: Inactive (08/02/2022)   Exercise Vital Sign    Days of Exercise per Week: 0 days    Minutes of Exercise per Session: 0 min  Stress: No Stress Concern Present (08/02/2022)   [place]    Feeling of Stress : Only a little  Social Connections: Socially Isolated (08/02/2022)   Social Connection and Isolation Panel [NHANES]    Frequency of Communication with Friends and Family: Once a week    Frequency of Social Gatherings with Friends and Family: Never    Attends Religious Services: Never    Marine scientist or Organizations: No    Attends Archivist Meetings: Never    Marital Status: Widowed    Tobacco Counseling Counseling given: Not Answered   Clinical Intake:  Pre-visit preparation completed: Yes  Pain : No/denies pain     Diabetes: No  How often do you need to have someone help you when you read instructions, pamphlets, or other written materials from your doctor or pharmacy?: 1 - Never  Diabetic?  no  Interpreter Needed?: No  Information entered by :: Ronnie Kennedy LPN   Activities of Daily Living     No data to display          Patient Care Team: [name], DO as PCP - General (Family Medicine) Bernardo Heater, Ronda Fairly, MD (Urology) Rebekah Chesterfield, LCSW as Social Worker (Licensed Clinical Social Worker)  Indicate any recent [employer] you may have received from other than [facility] providers in the past year (date may be approximate).     Assessment:   This is a routine wellness examination for [place].  Hearing/Vision screen Hearing Screening - Comments:: No trouble hearing Vision Screening - Comments:: Not up to date  Dietary issues and exercise activities discussed:      Goals Addressed             This Visit's Progress    Patient Stated       No goals       Depression Screen    08/02/2022   12:55 PM 01/24/2022    1:24 PM 10/22/2021   10:51 AM 07/27/2021    5:13 PM 04/02/2021    2:02 PM 02/09/2021   10:14 AM 09/10/2020    2:27 PM  PHQ 2/9 Scores  PHQ - 2 Score 0 0 0 0 0 0 0  PHQ- 9 Score 2 0 3    0    Fall Risk  08/02/2022   12:41 PM 07/27/2021    5:33 PM 04/02/2021    2:01 PM 02/09/2021   10:14 AM 09/10/2020    2:26 PM  Fall Risk   Falls in the past year? 0 0 0 0 0  Number falls in past yr: 0 0 0 0   Injury with Fall? 0 0 0 0   Risk for fall due to :  Impaired mobility History of fall(s) No Fall Risks   Follow up Falls evaluation completed;Education provided;Falls prevention discussed Education provided Falls evaluation completed  Falls evaluation completed    FALL RISK PREVENTION PERTAINING TO THE HOME:  Any stairs in or around the home? No  If so, are there any without handrails? No  Home free of loose throw rugs in walkways, pet beds, electrical cords, etc? Yes  Adequate lighting in your home to reduce risk of falls? Yes   ASSISTIVE DEVICES UTILIZED TO PREVENT FALLS:  Life alert? No  Use of a cane, walker or w/c? No  Grab bars in the bathroom? Yes  Shower chair or bench in shower? Yes  Elevated toilet seat or a handicapped toilet? No   TIMED UP AND GO:  Was the test performed? No .    Cognitive Function:    09/10/2021   10:35 AM  MMSE - Mini Mental State Exam  Orientation to time 1  Orientation to Place 0  Registration 2  Attention/ Calculation 4  Recall 1  Language- name 2 objects 2  Language- repeat 1  Language- follow 3 step command 3  Language- read & follow direction 1  Write a sentence 0  Copy design 0  Total score 15        08/02/2022   12:38 PM 07/27/2021    5:15 PM 12/10/2018    1:22 PM 11/10/2017   11:01 AM  6CIT Screen  What Year? 4 points 4 points 0 points 0 points  What month? 0 points 0  points 0 points 0 points  What time? 3 points 3 points 0 points 0 points  Count back from 20 4 points 0 points 0 points 0 points  Months in reverse 4 points 0 points 0 points 0 points  Repeat phrase 10 points 0 points 8 points 10 points  Total Score 25 points 7 points 8 points 10 points    Immunizations Immunization History  Administered Date(s) Administered   Fluad Quad(high Dose 65+) 01/23/2020, 11/10/2020, 09/10/2021   Influenza, High Dose Seasonal PF 10/10/2017, 12/10/2018   Pneumococcal Conjugate-13 10/10/2017   Pneumococcal Polysaccharide-23 12/10/2018   Td 01/01/2019    TDAP status: Up to date  Flu Vaccine status: Up to date  Pneumococcal vaccine status: Up to date  Covid-19 vaccine status: Information provided on how to obtain vaccines.   Qualifies for Shingles Vaccine? Yes   Zostavax completed No   Shingrix Completed?: No.    Education has been provided regarding the importance of this vaccine. Patient has been advised to call insurance company to determine out of pocket expense if they have not yet received this vaccine. Advised may also receive vaccine at local pharmacy or Health Dept. Verbalized acceptance and understanding.  Screening Tests Health Maintenance  Topic Date Due   INFLUENZA VACCINE  06/07/2022   COVID-19 Vaccine (1) 08/18/2022 (Originally 11/16/1950)   Zoster Vaccines- Shingrix (1 of 2) 11/01/2022 (Originally 11/17/1964)   COLONOSCOPY (Pts 45-49yr Insurance coverage will need to be confirmed)  05/17/2023   TETANUS/TDAP  01/01/2029   Pneumonia Vaccine  103+ Years old  Completed   Hepatitis C Screening  Completed   HPV VACCINES  Aged Out    Health Maintenance  Health Maintenance Due  Topic Date Due   INFLUENZA VACCINE  06/07/2022    Colorectal cancer screening: No longer required.   Lung Cancer Screening: (Low Dose CT Chest recommended if Age 28-80 years, 30 pack-year currently smoking OR have quit w/in 15years.) does not qualify.   Lung  Cancer Screening Referral:   Additional Screening:  Hepatitis C Screening: does not qualify; Completed 2020  Vision Screening: Recommended annual ophthalmology exams for early detection of glaucoma and other disorders of the eye. Is the patient up to date with their annual eye exam?  No  Who is the provider or what is the name of the office in which the patient attends annual eye exams?  If pt is not established with a provider, would they like to be referred to a provider to establish care? No .   Dental Screening: Recommended annual dental exams for proper oral hygiene  Community Resource Referral / Chronic Care Management: CRR required this visit?  No   CCM required this visit?  No      Plan:     I have personally reviewed and noted the following in the patient's chart:   Medical and social history Use of alcohol, tobacco or illicit drugs  Current medications and supplements including opioid prescriptions. Patient is not currently taking opioid prescriptions. Functional ability and status Nutritional status Physical activity Advanced directives List of other physicians Hospitalizations, surgeries, and ER visits in previous 12 months Vitals Screenings to include cognitive, depression, and falls Referrals and appointments  In addition, I have reviewed and discussed with patient certain preventive protocols, quality metrics, and best practice recommendations. A written personalized care plan for preventive services as well as general preventive health recommendations were provided to patient.     Ronnie Kennedy, LPN   2/95/1884   Nurse Notes: Patient was going to call Social worker he had her number.  The air conditioning has gone out in the house they are unable to have it fixed.

## 2022-08-02 NOTE — Patient Instructions (Signed)
Visit Information  Thank you for taking time to visit with me today. Please don't hesitate to contact me if I can be of assistance to you.   Following are the goals we discussed today:   Goals Addressed             This Visit's Progress    Per brother "My air and heat went out".       Care Coordination Interventions: Unscheduled call from patient's brother contacted this social worker to assist with resources to repair the [employer] and [employer] This social worker discussed plan to refer patient to the careguide for any possible  resource for assistance           If you are experiencing a Mental Health or [place] or need someone to talk to, please call the Suicide and Crisis Lifeline: 988 call 911   Patient verbalizes understanding of instructions and care plan provided today and agrees to view in [place]. Active MyChart status and patient understanding of how to access instructions and care plan via MyChart confirmed with patient.     No further follow up required: referral made to the resource careguide for any possible assistance for A/C repair  Elliot Gurney, [place]  [facility] Care Management [phone]

## 2022-08-02 NOTE — Patient Outreach (Signed)
  Care Coordination   Follow Up Visit Note   08/02/2022 Name: Ronnie Bullock MRN: [id number] DOB: 12-31-1945  Ronnie Bullock is a 76 y.o. year old male who sees [name], DO for primary care. I spoke with  Ronnie Bullock brother [place] by phone today.  What matters to the patients health and wellness today?  Central AIr and Heat Repair    Goals Addressed             This Visit's Progress    Per brother "My air and heat went out".       Care Coordination Interventions: Unscheduled call from patient's brother contacted this social worker to assist with resources to repair the [employer] and [employer] This social worker discussed plan to refer patient to the careguide for any possible  resource for assistance         SDOH assessments and interventions completed:  No     Care Coordination Interventions Activated:  Yes  Care Coordination Interventions:  Yes, provided   Follow up plan: No further intervention required.   Encounter Outcome:  Pt. Visit Completed

## 2022-08-04 ENCOUNTER — Encounter: Payer: Self-pay | Admitting: Family Medicine

## 2022-08-04 ENCOUNTER — Ambulatory Visit (INDEPENDENT_AMBULATORY_CARE_PROVIDER_SITE_OTHER): Payer: Medicare HMO | Admitting: Family Medicine

## 2022-08-04 VITALS — BP 138/80 | HR 76 | Temp 98.0°F | Wt 139.1 lb

## 2022-08-04 DIAGNOSIS — I25118 Atherosclerotic heart disease of native coronary artery with other forms of angina pectoris: Secondary | ICD-10-CM

## 2022-08-04 DIAGNOSIS — F0390 Unspecified dementia without behavioral disturbance: Secondary | ICD-10-CM

## 2022-08-04 DIAGNOSIS — Z23 Encounter for immunization: Secondary | ICD-10-CM | POA: Diagnosis not present

## 2022-08-04 DIAGNOSIS — I10 Essential (primary) hypertension: Secondary | ICD-10-CM

## 2022-08-04 DIAGNOSIS — C61 Malignant neoplasm of prostate: Secondary | ICD-10-CM

## 2022-08-04 DIAGNOSIS — R6889 Other general symptoms and signs: Secondary | ICD-10-CM | POA: Diagnosis not present

## 2022-08-04 DIAGNOSIS — R972 Elevated prostate specific antigen [PSA]: Secondary | ICD-10-CM | POA: Diagnosis not present

## 2022-08-04 DIAGNOSIS — E7800 Pure hypercholesterolemia, unspecified: Secondary | ICD-10-CM | POA: Diagnosis not present

## 2022-08-04 DIAGNOSIS — R339 Retention of urine, unspecified: Secondary | ICD-10-CM | POA: Diagnosis not present

## 2022-08-04 DIAGNOSIS — I824Y2 Acute embolism and thrombosis of unspecified deep veins of left proximal lower extremity: Secondary | ICD-10-CM

## 2022-08-04 DIAGNOSIS — Z0000 Encounter for general adult medical examination without abnormal findings: Secondary | ICD-10-CM

## 2022-08-04 DIAGNOSIS — R634 Abnormal weight loss: Secondary | ICD-10-CM

## 2022-08-04 LAB — MICROSCOPIC EXAMINATION: Bacteria, UA: NONE SEEN

## 2022-08-04 LAB — URINALYSIS, ROUTINE W REFLEX MICROSCOPIC
Bilirubin, UA: NEGATIVE
Glucose, UA: NEGATIVE
Leukocytes,UA: NEGATIVE
Nitrite, UA: NEGATIVE
Specific Gravity, UA: >1.03 — ABNORMAL HIGH (ref 1.005–1.030)
Urobilinogen, Ur: 0.2 mg/dL (ref 0.2–1.0)
pH, UA: 5.5 (ref 5.0–7.5)

## 2022-08-04 LAB — MICROALBUMIN, URINE WAIVED
Creatinine, Urine Waived: 300 mg/dL (ref 10–300)
Microalb, Ur Waived: 150 mg/L — ABNORMAL HIGH (ref 0–19)

## 2022-08-04 MED ORDER — TAMSULOSIN HCL 0.4 MG PO CAPS: ORAL_CAPSULE | ORAL | 1 refills | Status: DC

## 2022-08-04 MED ORDER — AMLODIPINE BESYLATE 5 MG PO TABS: 5.0000 mg | ORAL_TABLET | Freq: Every morning | ORAL | 0 refills | Status: DC

## 2022-08-04 MED ORDER — APIXABAN 5 MG PO TABS: ORAL_TABLET | ORAL | 1 refills | Status: DC

## 2022-08-04 MED ORDER — DONEPEZIL HCL 5 MG PO TABS: ORAL_TABLET | ORAL | 1 refills | Status: DC

## 2022-08-04 NOTE — Assessment & Plan Note (Signed)
Continue to follow with oncology. Continue to monitor. Call with any concerns.  

## 2022-08-04 NOTE — Assessment & Plan Note (Signed)
Under good control on current regimen. Continue current regimen. Continue to monitor. Call with any concerns. Refills given. Labs drawn today.   

## 2022-08-04 NOTE — Assessment & Plan Note (Signed)
Rechecking labs today. Await results. Treat as needed.  °

## 2022-08-04 NOTE — Assessment & Plan Note (Signed)
Will keep BP and cholesterol under good control. Continue to monitor. Call with any concerns.  

## 2022-08-04 NOTE — Assessment & Plan Note (Signed)
Doing well. Continue current regimen. Call with any concerns.

## 2022-08-04 NOTE — Progress Notes (Signed)
BP 138/80   Pulse 76   Temp 98 F (36.7 C)   Wt 139 lb 1.6 oz (63.1 kg)   SpO2 98%   BMI 20.54 kg/m    Subjective:    Patient ID: Ronnie Bullock, male    DOB: Apr 01, 1946, 75 y.o.   MRN: [id number]  HPI: Ronnie Bullock is a 76 y.o. male presenting on 08/04/2022 for comprehensive medical examination. Current medical complaints include:  HYPERTENSION / HYPERLIPIDEMIA Satisfied with current treatment? yes Duration of hypertension: chronic BP monitoring frequency: not checking BP medication side effects: no Past BP meds: amlodipine Duration of hyperlipidemia: chronic Cholesterol medication side effects: no Cholesterol supplements: none Past cholesterol medications: none Medication compliance: excellent compliance Aspirin: no Recent stressors: no Recurrent headaches: no Visual changes: no Palpitations: no Dyspnea: no Chest pain: no Lower extremity edema: no Dizzy/lightheaded: no  Interim Problems from his last visit: no  Depression Screen done today and results listed below:     08/02/2022   12:55 PM 01/24/2022    1:24 PM 10/22/2021   10:51 AM 07/27/2021    5:13 PM 04/02/2021    2:02 PM  Depression screen PHQ 2/9  Decreased Interest 0 0 0 0 0  Down, Depressed, Hopeless 0 0 0 0 0  PHQ - 2 Score 0 0 0 0 0  Altered sleeping 0 0 0    Tired, decreased energy 0 0 0    Change in appetite 0 0 0    Feeling bad or failure about yourself  0 0 0    Trouble concentrating 2 0 3    Moving slowly or fidgety/restless 0 0 0    Suicidal thoughts 0 0 0    PHQ-9 Score 2 0 3    Difficult doing work/chores Not difficult at all        Past Medical History:  Past Medical History:  Diagnosis Date   Dementia ([place])    Dementia ([place])    Hyperlipidemia    Hypertension    Incontinence     Surgical History:  Past Surgical History:  Procedure Laterality Date   COLONOSCOPY WITH PROPOFOL N/A 05/16/2018   Procedure: COLONOSCOPY WITH PROPOFOL;  Surgeon: Virgel Manifold, MD;   Location: [facility] ENDOSCOPY;  Service: Endoscopy;  Laterality: N/A;   PERIPHERAL VASCULAR THROMBECTOMY Left 01/22/2021   Procedure: PERIPHERAL VASCULAR THROMBECTOMY / THROMBOLYSIS;  Surgeon: Katha Cabal, MD;  Location: [place] [facility];  Service: Cardiovascular;  Laterality: Left;    Medications:  Current Outpatient Medications on File Prior to Visit  Medication Sig   fluticasone (FLONASE) 50 MCG/ACT nasal spray PLACE TWO SPRAYS IN EACH NOSTRIL DAILY   Multiple Vitamin (MULTIVITAMIN WITH MINERALS) TABS tablet Take 1 tablet by mouth daily.   No current facility-administered medications on file prior to visit.    Allergies:  No Known Allergies  Social History:  Social History   Socioeconomic History   Marital status: Widowed    Spouse name: Not on file   Number of children: Not on file   Years of education: Not on file   Highest education level: 12th grade  Occupational History   Not on file  Tobacco Use   Smoking status: Never   Smokeless tobacco: Never  Vaping Use   Vaping Use: Never used  Substance and Sexual Activity   Alcohol use: No    Alcohol/week: 0.0 standard drinks of alcohol   Drug use: No   Sexual activity: Yes  Other Topics Concern  Not on file  Social History Narrative   Not on file   Social Determinants of Health   Financial Resource Strain: High Risk (08/02/2022)   Overall Financial Resource Strain (CARDIA)    Difficulty of Paying Living Expenses: Hard  Food Insecurity: No Food Insecurity (07/27/2021)   Hunger Vital Sign    Worried About Running Out of Food in the Last Year: Never true    Ran Out of Food in the Last Year: Never true  Transportation Needs: Unmet Transportation Needs (08/02/2022)   PRAPARE - Hydrologist (Medical): Yes    Lack of Transportation (Non-Medical): No  Physical Activity: Inactive (08/02/2022)   Exercise Vital Sign    Days of Exercise per Week: 0 days    Minutes of Exercise per Session: 0  min  Stress: No Stress Concern Present (08/02/2022)   [place]    Feeling of Stress : Only a little  Social Connections: Socially Isolated (08/02/2022)   Social Connection and Isolation Panel [NHANES]    Frequency of Communication with Friends and Family: Once a week    Frequency of Social Gatherings with Friends and Family: Never    Attends Religious Services: Never    Marine scientist or Organizations: No    Attends Archivist Meetings: Never    Marital Status: Widowed  Intimate Partner Violence: Not At Risk (08/02/2022)   Humiliation, Afraid, Rape, and Kick questionnaire    Fear of Current or Ex-Partner: No    Emotionally Abused: No    Physically Abused: No    Sexually Abused: No   Social History   Tobacco Use  Smoking Status Never  Smokeless Tobacco Never   Social History   Substance and Sexual Activity  Alcohol Use No   Alcohol/week: 0.0 standard drinks of alcohol    Family History:  Family History  Problem Relation Age of Onset   Hypertension Mother    Prostate cancer Neg Hx    Bladder Cancer Neg Hx    Kidney cancer Neg Hx     Past medical history, surgical history, medications, allergies, family history and social history reviewed with patient today and changes made to appropriate areas of the chart.   Review of Systems  Constitutional: Negative.   HENT: Negative.    Eyes: Negative.   Respiratory: Negative.    Cardiovascular: Negative.   Gastrointestinal: Negative.   Genitourinary: Negative.   Musculoskeletal: Negative.   Skin: Negative.   Neurological: Negative.   Endo/Heme/Allergies: Negative.   Psychiatric/Behavioral: Negative.     All other ROS negative except what is listed above and in the HPI.      Objective:    BP 138/80   Pulse 76   Temp 98 F (36.7 C)   Wt 139 lb 1.6 oz (63.1 kg)   SpO2 98%   BMI 20.54 kg/m   Wt Readings from Last 3 Encounters:   08/04/22 139 lb 1.6 oz (63.1 kg)  01/24/22 148 lb 3.2 oz (67.2 kg)  10/22/21 160 lb 9.6 oz (72.8 kg)    Physical Exam Vitals and nursing note reviewed.  Constitutional:      General: He is not in acute distress.    Appearance: Normal appearance. He is normal weight. He is not ill-appearing, toxic-appearing or diaphoretic.  HENT:     Head: Normocephalic and atraumatic.     Right Ear: Tympanic membrane, ear canal and external ear normal. There is  no impacted cerumen.     Left Ear: Tympanic membrane, ear canal and external ear normal. There is no impacted cerumen.     Nose: Nose normal. No congestion or rhinorrhea.     Mouth/Throat:     Mouth: Mucous membranes are moist.     Pharynx: Oropharynx is clear. No oropharyngeal exudate or posterior oropharyngeal erythema.  Eyes:     General: No scleral icterus.       Right eye: No discharge.        Left eye: No discharge.     Extraocular Movements: Extraocular movements intact.     Conjunctiva/sclera: Conjunctivae normal.     Pupils: Pupils are equal, round, and reactive to light.  Neck:     Vascular: No carotid bruit.  Cardiovascular:     Rate and Rhythm: Normal rate and regular rhythm.     Pulses: Normal pulses.     Heart sounds: No murmur heard.    No friction rub. No gallop.  Pulmonary:     Effort: Pulmonary effort is normal. No respiratory distress.     Breath sounds: Normal breath sounds. No stridor. No wheezing, rhonchi or rales.  Chest:     Chest wall: No tenderness.  Abdominal:     General: Abdomen is flat. Bowel sounds are normal. There is no distension.     Palpations: Abdomen is soft. There is no mass.     Tenderness: There is no abdominal tenderness. There is no right CVA tenderness, left CVA tenderness, guarding or rebound.     Hernia: No hernia is present.  Genitourinary:    Comments: Genital exam deferred with shared decision making Musculoskeletal:        General: No swelling, tenderness, deformity or signs of  injury.     Cervical back: Normal range of motion and neck supple. No rigidity. No muscular tenderness.     Right lower leg: No edema.     Left lower leg: No edema.  Lymphadenopathy:     Cervical: No cervical adenopathy.  Skin:    General: Skin is warm and dry.     Capillary Refill: Capillary refill takes less than 2 seconds.     Coloration: Skin is not jaundiced or pale.     Findings: No bruising, erythema, lesion or rash.  Neurological:     General: No focal deficit present.     Mental Status: He is alert and oriented to person, place, and time.     Cranial Nerves: No cranial nerve deficit.     Sensory: No sensory deficit.     Motor: No weakness.     Coordination: Coordination normal.     Gait: Gait normal.     Deep Tendon Reflexes: Reflexes normal.  Psychiatric:        Mood and Affect: Mood normal.        Behavior: Behavior normal.        Thought Content: Thought content normal.        Judgment: Judgment normal.     Results for orders placed or performed in visit on 08/04/22  Microscopic Examination   Urine  Result Value Ref Range   WBC, UA 0-5 0 - 5 /hpf   RBC, Urine 0-2 0 - 2 /hpf   Epithelial Cells (non renal) 0-10 0 - 10 /hpf   Casts Present (A) None seen /lpf   Cast Type Hyaline casts N/A   Bacteria, UA None seen None seen/Few  Urinalysis, Routine w reflex microscopic  Result  Value Ref Range   Specific Gravity, UA >1.030 (H) 1.005 - 1.030   pH, UA 5.5 5.0 - 7.5   Color, UA Yellow Yellow   Appearance Ur Clear Clear   Leukocytes,UA Negative Negative   Protein,UA 3+ (A) Negative/Trace   Glucose, UA Negative Negative   Ketones, UA Trace (A) Negative   RBC, UA Trace (A) Negative   Bilirubin, UA Negative Negative   Urobilinogen, Ur 0.2 0.2 - 1.0 mg/dL   Nitrite, UA Negative Negative   Microscopic Examination See below:   Microalbumin, Urine Waived  Result Value Ref Range   Microalb, Ur Waived 150 (H) 0 - 19 mg/L   Creatinine, Urine Waived 300 10 - 300 mg/dL    Microalb/Creat Ratio 30-300 (H) <30 mg/g      Assessment & Plan:   Problem List Items Addressed This Visit       Cardiovascular and Mediastinum   Essential hypertension, benign    Under good control on current regimen. Continue current regimen. Continue to monitor. Call with any concerns. Refills given. Labs drawn today.        Relevant Medications   apixaban (ELIQUIS) 5 MG TABS tablet   amLODipine (NORVASC) 5 MG tablet   Other Relevant Orders   Comprehensive metabolic panel   TSH   Urinalysis, Routine w reflex microscopic (Completed)   Microalbumin, Urine Waived (Completed)   DVT (deep venous thrombosis) (HCC)    Doing well on eliquis. No concerns Continue to monitor.       Relevant Medications   apixaban (ELIQUIS) 5 MG TABS tablet   amLODipine (NORVASC) 5 MG tablet   Other Relevant Orders   Comprehensive metabolic panel   CBC with Differential/Platelet   CAD (coronary artery disease)    Will keep BP and cholesterol under good control. Continue to monitor. Call with any concerns.       Relevant Medications   apixaban (ELIQUIS) 5 MG TABS tablet   amLODipine (NORVASC) 5 MG tablet     Nervous and Auditory   Dementia without behavioral disturbance ([place])    Doing well. Continue current regimen. Call with any concerns.       Relevant Medications   donepezil (ARICEPT) 5 MG tablet     Genitourinary   Prostate cancer ([place])    Continue to follow with oncology. Continue to monitor. Call with any concerns.       Relevant Orders   Urinalysis, Routine w reflex microscopic (Completed)     Other   Hypercholesteremia    Rechecking labs today. Await results. Treat as needed.       Relevant Medications   apixaban (ELIQUIS) 5 MG TABS tablet   amLODipine (NORVASC) 5 MG tablet   Other Relevant Orders   Comprehensive metabolic panel   Lipid Panel w/o Chol/HDL Ratio   Elevated PSA    Rechecking labs today. Await results. Treat as needed.       Relevant Orders    Comprehensive metabolic panel   PSA   Other Visit Diagnoses     Routine general medical examination at a health care facility    -  Primary   Vaccines up to date. Screening labs checked today. Continue diet and exercise. Call with any concerns.    Incomplete bladder emptying       Doing well on his flomax. Refills given. Call with any concerns.    Relevant Medications   tamsulosin (FLOMAX) 0.4 MG CAPS capsule   Weight loss  Will start ensure. Call with any concerns. Continue to monitor.    Need for influenza vaccination       Relevant Orders   Flu Vaccine QUAD High Dose(Fluad) (Completed)        Discussed aspirin prophylaxis for myocardial infarction prevention and decision was it was not indicated  LABORATORY TESTING:  Health maintenance labs ordered today as discussed above.   The natural history of prostate cancer and ongoing controversy regarding screening and potential treatment outcomes of prostate cancer has been discussed with the patient. The meaning of a false positive PSA and a false negative PSA has been discussed. He indicates understanding of the limitations of this screening test and wishes  to proceed with screening PSA testing.   IMMUNIZATIONS:   - Tdap: Tetanus vaccination status reviewed: last tetanus booster within 10 years. - Influenza: Administered today - Pneumovax: Up to date - Prevnar: Up to date - COVID: Declined - HPV: Not applicable - Shingrix vaccine: Declined  SCREENING: - Colonoscopy: Up to date  Discussed with patient purpose of the colonoscopy is to detect colon cancer at curable precancerous or early stages   PATIENT COUNSELING:    Sexuality: Discussed sexually transmitted diseases, partner selection, use of condoms, avoidance of unintended pregnancy  and contraceptive alternatives.   Advised to avoid cigarette smoking.  I discussed with the patient that most people either abstain from alcohol or drink within safe limits (<=14/week  and <=4 drinks/occasion for males, <=7/weeks and <= 3 drinks/occasion for females) and that the risk for alcohol disorders and other health effects rises proportionally with the number of drinks per week and how often a drinker exceeds daily limits.  Discussed cessation/primary prevention of drug use and availability of treatment for abuse.   Diet: Encouraged to adjust caloric intake to maintain  or achieve ideal body weight, to reduce intake of dietary saturated fat and total fat, to limit sodium intake by avoiding high sodium foods and not adding table salt, and to maintain adequate dietary potassium and calcium preferably from fresh fruits, vegetables, and low-fat dairy products.    stressed the importance of regular exercise  Injury prevention: Discussed safety belts, safety helmets, smoke detector, smoking near bedding or upholstery.   Dental health: Discussed importance of regular tooth brushing, flossing, and dental visits.   Follow up plan: NEXT PREVENTATIVE PHYSICAL DUE IN 1 YEAR. Return in about 6 months (around 02/02/2023).

## 2022-08-04 NOTE — Assessment & Plan Note (Signed)
Doing well on eliquis. No concerns Continue to monitor.

## 2022-08-05 LAB — CBC WITH DIFFERENTIAL/PLATELET
Basophils Absolute: 0 10*3/uL (ref 0.0–0.2)
Basos: 1 %
EOS (ABSOLUTE): 0.3 10*3/uL (ref 0.0–0.4)
Eos: 8 %
Hematocrit: 39.2 % (ref 37.5–51.0)
Hemoglobin: 13.7 g/dL (ref 13.0–17.7)
Immature Grans (Abs): 0 10*3/uL (ref 0.0–0.1)
Immature Granulocytes: 0 %
Lymphocytes Absolute: 0.9 10*3/uL (ref 0.7–3.1)
Lymphs: 24 %
MCH: 30.2 pg (ref 26.6–33.0)
MCHC: 34.9 g/dL (ref 31.5–35.7)
MCV: 87 fL (ref 79–97)
Monocytes Absolute: 0.5 10*3/uL (ref 0.1–0.9)
Monocytes: 12 %
Neutrophils Absolute: 2.1 10*3/uL (ref 1.4–7.0)
Neutrophils: 55 %
Platelets: 210 10*3/uL (ref 150–450)
RBC: 4.53 x10E6/uL (ref 4.14–5.80)
RDW: 13.2 % (ref 11.6–15.4)
WBC: 3.8 10*3/uL (ref 3.4–10.8)

## 2022-08-05 LAB — COMPREHENSIVE METABOLIC PANEL
ALT: 4 IU/L (ref 0–44)
AST: 17 IU/L (ref 0–40)
Albumin/Globulin Ratio: 0.9 — ABNORMAL LOW (ref 1.2–2.2)
Albumin: 3.5 g/dL — ABNORMAL LOW (ref 3.8–4.8)
Alkaline Phosphatase: 90 IU/L (ref 44–121)
BUN/Creatinine Ratio: 13 (ref 10–24)
BUN: 15 mg/dL (ref 8–27)
Bilirubin Total: 0.4 mg/dL (ref 0.0–1.2)
CO2: 22 mmol/L (ref 20–29)
Calcium: 9.2 mg/dL (ref 8.6–10.2)
Chloride: 103 mmol/L (ref 96–106)
Creatinine, Ser: 1.18 mg/dL (ref 0.76–1.27)
Globulin, Total: 3.7 g/dL (ref 1.5–4.5)
Glucose: 111 mg/dL — ABNORMAL HIGH (ref 70–99)
Potassium: 4.3 mmol/L (ref 3.5–5.2)
Sodium: 138 mmol/L (ref 134–144)
Total Protein: 7.2 g/dL (ref 6.0–8.5)
eGFR: 64 mL/min/{1.73_m2} (ref >59)

## 2022-08-05 LAB — LIPID PANEL W/O CHOL/HDL RATIO
Cholesterol, Total: 169 mg/dL (ref 100–199)
HDL: 68 mg/dL (ref >39)
LDL Chol Calc (NIH): 90 mg/dL (ref 0–99)
Triglycerides: 52 mg/dL (ref 0–149)
VLDL Cholesterol Cal: 11 mg/dL (ref 5–40)

## 2022-08-05 LAB — TSH: TSH: 1.03 u[IU]/mL (ref 0.450–4.500)

## 2022-08-06 LAB — SPECIMEN STATUS REPORT

## 2022-08-06 LAB — PSA: Prostate Specific Ag, Serum: 0.2 ng/mL (ref 0.0–4.0)

## 2022-08-10 ENCOUNTER — Ambulatory Visit: Payer: Self-pay | Admitting: *Deleted

## 2022-08-10 NOTE — Patient Outreach (Addendum)
  Care Coordination   Follow Up Visit Note   08/10/2022 Name: ZIYON SOLTAU MRN: [id number] DOB: 24-Jul-1946  [name] is a 76 y.o. year old male who sees [name], DO for primary care. I spoke with  [name] brother Kirby by phone today.  What matters to the patients health and wellness today?  Community Resources    Goals Addressed             This Visit's Progress    Per brother "My air and heat went out".       Care Coordination Interventions: Follow up phone call to  patient's brother to assist with resources to repair the [employer] and [employer] Patient's brother confirmed that he did speak to the careguide regarding possible resources Contact information for the [employer] program provided [phone] as well as the [place] through [place] [phone] This social worker encouraged patient's brother to call the above resources to discuss possible assistance with his Clinical biochemist and [place] agreed to send patient's brother additional resources for possible assistance for home repairs-patient's community resource needs and possible options discussed with leadership         SDOH assessments and interventions completed:  Yes  SDOH Interventions Today    Flowsheet Row Most Recent Value  SDOH Interventions   Food Insecurity Interventions Intervention Not Indicated  Housing Interventions Ambulatory [id number] Order  [place] resources provided]        Care Coordination Interventions Activated:  Yes  Care Coordination Interventions:  Yes, provided   Follow up plan: No further intervention required.   Encounter Outcome:  Pt. Visit Completed

## 2022-08-10 NOTE — Patient Instructions (Addendum)
Visit Information  Thank you for taking time to visit with me today. Please don't hesitate to contact me if I can be of assistance to you.   Following are the goals we discussed today:   Goals Addressed             This Visit's Progress    Per brother "My air and heat went out".       Care Coordination Interventions: Follow up phone call to  patient's brother to assist with resources to repair the [employer] and [employer] Patient's brother confirmed that he did speak to the careguide regarding possible resources Contact information for the [employer] program provided [phone] as well as the [place] through [place] [phone] This social worker encouraged patient's brother to call the above resources to discuss possible assistance with his Clinical biochemist and [place] agreed to send patient's brother additional resources for possible assistance for home repairs-patient's community resource needs and possible options discussed with leadership        If you are experiencing a Mental Health or [place] or need someone to talk to, please call the Suicide and Crisis Lifeline: 988   Patient verbalizes understanding of instructions and care plan provided today and agrees to view in [place]. Active MyChart status and patient understanding of how to access instructions and care plan via MyChart confirmed with patient.     No further follow up required: patient's brother to call resources provided for assistance with [employer] repair  [employer], [place]/[facility] Care Management [phone]

## 2022-08-12 ENCOUNTER — Telehealth: Payer: Self-pay

## 2022-08-12 NOTE — Telephone Encounter (Signed)
   Telephone encounter was:  Successful.  08/12/2022 Name: Ronnie Bullock MRN: [id number] DOB: 1946/08/13  [name] is a 76 y.o. year old male who is a primary care patient of [name], DO . The community resource team was consulted for assistance with Financial Difficulties related to bills/repairs  Care guide performed the following interventions: Follow up call placed to the patient to discuss status of referral Patient does not qualify for the [place], therefore, CG will send out [facility] Application.  CG also sent out repair resources/weatherization program information and Press photographer. Pt/Brother have been advised: I have mailed the following information and if he has not received the information in 7 to 14 days or if he has any additional questions to please call me back at [phone]. Patient understood.  Follow Up Plan:  No further follow up planned at this time. The patient has been provided with needed resources.  [place] management  [place], [place]  Main Phone: [phone]  E-mail: [email or web address]  Website: [email or web address]

## 2022-09-05 ENCOUNTER — Encounter (INDEPENDENT_AMBULATORY_CARE_PROVIDER_SITE_OTHER): Payer: Self-pay

## 2022-10-06 ENCOUNTER — Other Ambulatory Visit: Payer: Self-pay | Admitting: Family Medicine

## 2022-10-07 NOTE — Telephone Encounter (Signed)
Requested Prescriptions  Pending Prescriptions Disp Refills   fluticasone (FLONASE) 50 MCG/ACT nasal spray [Pharmacy Med Name: fluticasone propionate 50 mcg/actuation nasal spray,suspension] 16 g 2    Sig: Place two SPRAYS in each nostril daily     Ear, Nose, and Throat: Nasal Preparations - Corticosteroids Passed - 10/06/2022  3:57 PM      Passed - Valid encounter within last 12 months    Recent Outpatient Visits           2 months ago Routine general medical examination at a health care facility   [facility], [place] P, DO   8 months ago Dementia without behavioral disturbance [facility])   [facility], Megan P, DO   11 months ago Essential hypertension, benign   [place], [place], DO   1 year ago Dementia without behavioral disturbance [facility])   [place], Megan P, DO   1 year ago Seasonal allergic rhinitis due to pollen   [facility], Barb Merino, DO       Future Appointments             In 3 weeks Wynetta Emery, Barb Merino, DO [employer]

## 2022-11-03 ENCOUNTER — Ambulatory Visit: Payer: Medicare HMO | Admitting: Family Medicine

## 2022-11-15 ENCOUNTER — Ambulatory Visit: Payer: Medicare HMO | Admitting: Family Medicine

## 2022-12-01 ENCOUNTER — Ambulatory Visit: Payer: Medicare HMO | Admitting: Family Medicine

## 2023-01-13 ENCOUNTER — Other Ambulatory Visit: Payer: Self-pay | Admitting: Family Medicine

## 2023-01-13 NOTE — Telephone Encounter (Signed)
Requested Prescriptions  Pending Prescriptions Disp Refills   amLODipine (NORVASC) 5 MG tablet [Pharmacy Med Name: amlodipine 5 mg tablet] 90 tablet 0    Sig: TAKE ONE TABLET BY MOUTH EVERY MORNING     Cardiovascular: Calcium Channel Blockers 2 Passed - 01/13/2023  2:28 PM      Passed - Last BP in normal range    BP Readings from Last 1 Encounters:  08/04/22 138/80         Passed - Last Heart Rate in normal range    Pulse Readings from Last 1 Encounters:  08/04/22 76         Passed - Valid encounter within last 6 months    Recent Outpatient Visits           5 months ago Routine general medical examination at a health care facility   [facility], [place] P, DO   11 months ago Dementia without behavioral disturbance [facility])   [place], Megan P, DO   1 year ago Essential hypertension, benign   [place], [place], DO   1 year ago Dementia without behavioral disturbance [facility])   [place], Megan P, DO   1 year ago Seasonal allergic rhinitis due to pollen   [place], Megan P, DO               fluticasone (FLONASE) 50 MCG/ACT nasal spray [Pharmacy Med Name: fluticasone propionate 50 mcg/actuation nasal spray,suspension] 16 g 2    Sig: Instill two SPRAYS into each nostril daily     Ear, Nose, and Throat: Nasal Preparations - Corticosteroids Passed - 01/13/2023  2:28 PM      Passed - Valid encounter within last 12 months    Recent Outpatient Visits           5 months ago Routine general medical examination at a health care facility   [facility], [place], DO   11 months ago Dementia without behavioral disturbance [facility])   [place], [place], DO   1 year ago Essential hypertension, benign   [place], [place] ,  DO   1 year ago Dementia without behavioral disturbance [facility])   [place], Megan P, DO   1 year ago Seasonal allergic rhinitis due to pollen   [place], Megan P, DO

## 2023-04-03 ENCOUNTER — Other Ambulatory Visit: Payer: Self-pay | Admitting: Family Medicine

## 2023-04-03 DIAGNOSIS — R339 Retention of urine, unspecified: Secondary | ICD-10-CM

## 2023-04-04 NOTE — Telephone Encounter (Signed)
Requested Prescriptions  Pending Prescriptions Disp Refills   ELIQUIS 5 MG TABS tablet [Pharmacy Med Name: Eliquis 5 mg tablet] 180 tablet 0    Sig: TAKE ONE TABLET BY MOUTH AT BREAKFAST AND AT BEDTIME     Hematology:  Anticoagulants - apixaban Passed - 04/03/2023  8:04 AM      Passed - PLT in normal range and within 360 days    Platelets  Date Value Ref Range Status  08/04/2022 210 150 - 450 x10E3/uL Final         Passed - HGB in normal range and within 360 days    Hemoglobin  Date Value Ref Range Status  08/04/2022 13.7 13.0 - 17.7 g/dL Final         Passed - HCT in normal range and within 360 days    Hematocrit  Date Value Ref Range Status  08/04/2022 39.2 37.5 - 51.0 % Final         Passed - Cr in normal range and within 360 days    Creatinine, Ser  Date Value Ref Range Status  08/04/2022 1.18 0.76 - 1.27 mg/dL Final         Passed - AST in normal range and within 360 days    AST  Date Value Ref Range Status  08/04/2022 17 0 - 40 IU/L Final         Passed - ALT in normal range and within 360 days    ALT  Date Value Ref Range Status  08/04/2022 4 0 - 44 IU/L Final         Passed - Valid encounter within last 12 months    Recent Outpatient Visits           8 months ago Routine general medical examination at a health care facility   [place] [facility] [place], [place] P, DO   1 year ago Dementia without behavioral disturbance [facility])   [place] [facility] [place], Megan P, DO   1 year ago Essential hypertension, benign   [place] [facility] [place], Megan P, DO   1 year ago Dementia without behavioral disturbance [facility])   [place] [facility] [place], Megan P, DO   1 year ago Seasonal allergic rhinitis due to pollen   [facility], Megan P, DO               donepezil (ARICEPT) 5 MG tablet [Pharmacy Med Name: donepezil 5 mg tablet] 90 tablet 0    Sig: TAKE ONE  TABLET BY MOUTH EVERYDAY AT BEDTIME     Neurology:  Alzheimer's Agents Failed - 04/03/2023  8:04 AM      Failed - Valid encounter within last 6 months    Recent Outpatient Visits           8 months ago Routine general medical examination at a health care facility   [facility] [place], [place] P, DO   1 year ago Dementia without behavioral disturbance [facility])   [place] [facility] [place], Megan P, DO   1 year ago Essential hypertension, benign   [place] [facility] [place], Megan P, DO   1 year ago Dementia without behavioral disturbance [facility])   [place] [facility] [place], Megan P, DO   1 year ago Seasonal allergic rhinitis due to pollen   [facility], Megan P, DO  tamsulosin (FLOMAX) 0.4 MG CAPS capsule [Pharmacy Med Name: tamsulosin 0.4 mg capsule] 90 capsule 0    Sig: TAKE ONE CAPSULE BY MOUTH EVERYDAY AT BEDTIME     Urology: Alpha-Adrenergic Blocker Passed - 04/03/2023  8:04 AM      Passed - PSA in normal range and within 360 days    Prostate Specific Ag, Serum  Date Value Ref Range Status  08/04/2022 0.2 0.0 - 4.0 ng/mL Final    Comment:    Roche ECLIA methodology. According to the American Urological Association, Serum PSA should decrease and remain at undetectable levels after radical prostatectomy. The AUA defines biochemical recurrence as an initial PSA value 0.2 ng/mL or greater followed by a subsequent confirmatory PSA value 0.2 ng/mL or greater. Values obtained with different assay methods or kits cannot be used interchangeably. Results cannot be interpreted as absolute evidence of the presence or absence of malignant disease.          Passed - Last BP in normal range    BP Readings from Last 1 Encounters:  08/04/22 138/80         Passed - Valid encounter within last 12 months    Recent Outpatient Visits           8 months ago  Routine general medical examination at a health care facility   [facility] [place], [place] P, DO   1 year ago Dementia without behavioral disturbance [facility])   [place] [facility] [place], Megan P, DO   1 year ago Essential hypertension, benign   [place] [facility] [place], Megan P, DO   1 year ago Dementia without behavioral disturbance [facility])   [place] [facility] [place], Megan P, DO   1 year ago Seasonal allergic rhinitis due to pollen   [facility], Megan P, DO

## 2023-04-06 ENCOUNTER — Other Ambulatory Visit: Payer: Self-pay | Admitting: Family Medicine

## 2023-04-06 NOTE — Telephone Encounter (Signed)
Requested Prescriptions  Pending Prescriptions Disp Refills   amLODipine (NORVASC) 5 MG tablet [Pharmacy Med Name: amlodipine 5 mg tablet] 90 tablet 0    Sig: TAKE ONE TABLET BY MOUTH EVERY MORNING     Cardiovascular: Calcium Channel Blockers 2 Failed - 04/06/2023  8:02 AM      Failed - Valid encounter within last 6 months    Recent Outpatient Visits           8 months ago Routine general medical examination at a health care facility   [facility] [place], [place] P, DO   1 year ago Dementia without behavioral disturbance [facility])   [facility] [place], Megan P, DO   1 year ago Essential hypertension, benign   [place] [facility] [place], Megan P, DO   1 year ago Dementia without behavioral disturbance [facility])   [place] [facility] [place], Megan P, DO   1 year ago Seasonal allergic rhinitis due to pollen   [facility], [facility] P, DO              Passed - Last BP in normal range    BP Readings from Last 1 Encounters:  08/04/22 138/80         Passed - Last Heart Rate in normal range    Pulse Readings from Last 1 Encounters:  08/04/22 76          fluticasone (FLONASE) 50 MCG/ACT nasal spray [Pharmacy Med Name: fluticasone propionate 50 mcg/actuation nasal spray,suspension] 16 g 0    Sig: Instill two SPRAYS into each nostril daily     Ear, Nose, and Throat: Nasal Preparations - Corticosteroids Passed - 04/06/2023  8:02 AM      Passed - Valid encounter within last 12 months    Recent Outpatient Visits           8 months ago Routine general medical examination at a health care facility   [facility] [place], [place] P, DO   1 year ago Dementia without behavioral disturbance [facility])   [place] [facility] [place], Megan P, DO   1 year ago Essential hypertension, benign   [place] [facility] [place], Megan P, DO    1 year ago Dementia without behavioral disturbance [facility])    [facility] [place], Megan P, DO   1 year ago Seasonal allergic rhinitis due to pollen   [place], Megan P, DO              .

## 2023-04-12 ENCOUNTER — Telehealth: Payer: Self-pay

## 2023-04-12 NOTE — Telephone Encounter (Signed)
Copied from CRM [id number]. Topic: General - Other >> Apr 12, 2023  3:42 PM Clide Dales wrote: Dorathy Daft with [facility] called to see if rx for fluticasone (FLONASE) 50 MCG/ACT nasal spray to a 90 day supply. Please advise.

## 2023-04-13 NOTE — Telephone Encounter (Signed)
Patient is overdue for visit. He will need an appt for further refills.

## 2023-04-13 NOTE — Telephone Encounter (Signed)
Left message on machine for patient to call the office and schedule an appointment

## 2023-05-02 ENCOUNTER — Ambulatory Visit (INDEPENDENT_AMBULATORY_CARE_PROVIDER_SITE_OTHER): Payer: Medicare HMO | Admitting: Family Medicine

## 2023-05-02 ENCOUNTER — Encounter: Payer: Self-pay | Admitting: Family Medicine

## 2023-05-02 VITALS — BP 162/88 | HR 72 | Temp 97.9°F | Wt 159.2 lb

## 2023-05-02 DIAGNOSIS — N430 Encysted hydrocele: Secondary | ICD-10-CM

## 2023-05-02 DIAGNOSIS — E7800 Pure hypercholesterolemia, unspecified: Secondary | ICD-10-CM | POA: Diagnosis not present

## 2023-05-02 DIAGNOSIS — I25118 Atherosclerotic heart disease of native coronary artery with other forms of angina pectoris: Secondary | ICD-10-CM

## 2023-05-02 DIAGNOSIS — F0390 Unspecified dementia without behavioral disturbance: Secondary | ICD-10-CM

## 2023-05-02 DIAGNOSIS — I824Y2 Acute embolism and thrombosis of unspecified deep veins of left proximal lower extremity: Secondary | ICD-10-CM

## 2023-05-02 DIAGNOSIS — I10 Essential (primary) hypertension: Secondary | ICD-10-CM

## 2023-05-02 DIAGNOSIS — C61 Malignant neoplasm of prostate: Secondary | ICD-10-CM | POA: Diagnosis not present

## 2023-05-02 NOTE — Progress Notes (Signed)
BP (!) 162/88   Pulse 72   Temp 97.9 F (36.6 C) (Oral)   Wt 159 lb 3.2 oz (72.2 kg)   SpO2 100%   BMI 23.51 kg/m    Subjective:    Patient ID: Ronnie Bullock, male    DOB: 02-19-1946, 77 y.o.   MRN: [id number]  HPI: Ronnie Bullock is a 77 y.o. male  Chief Complaint  Patient presents with   Hypertension   Ronnie Bullock is here today by himself. He is not able to answer questions, does not know what medications he is taking and is not able to tell [place] the date or his birthday. We got his pastor to come in today to help with his appointment.   HYPERTENSION / HYPERLIPIDEMIA Satisfied with current treatment? no Duration of hypertension: chronic BP monitoring frequency: not checking BP medication side effects: no Past BP meds: amlodipine Duration of hyperlipidemia: chronic Cholesterol medication side effects: N/A Cholesterol supplements: none Past cholesterol medications: none Medication compliance: unsure if he's taking his medicine Aspirin: no Recent stressors: no Recurrent headaches: no Visual changes: no Palpitations: no Dyspnea: no Chest pain: no Lower extremity edema: no Dizzy/lightheaded: no  Relevant past medical, surgical, family and social history reviewed and updated as indicated. Interim medical history since our last visit reviewed. Allergies and medications reviewed and updated.  Review of Systems  Reason unable to perform ROS: dementia- he says "he's all good"  Respiratory: Negative.    Cardiovascular: Negative.   Genitourinary:  Positive for penile pain and scrotal swelling. Negative for dysuria.  Psychiatric/Behavioral:  Positive for behavioral problems and confusion.     Per HPI unless specifically indicated above     Objective:    BP (!) 162/88   Pulse 72   Temp 97.9 F (36.6 C) (Oral)   Wt 159 lb 3.2 oz (72.2 kg)   SpO2 100%   BMI 23.51 kg/m   Wt Readings from Last 3 Encounters:  05/02/23 159 lb 3.2 oz (72.2 kg)  08/04/22 139 lb 1.6 oz  (63.1 kg)  01/24/22 148 lb 3.2 oz (67.2 kg)    Physical Exam Vitals and nursing note reviewed.  Constitutional:      General: He is not in acute distress.    Appearance: Normal appearance. He is normal weight. He is not ill-appearing, toxic-appearing or diaphoretic.  HENT:     Head: Normocephalic and atraumatic.     Right Ear: External ear normal.     Left Ear: External ear normal.     Nose: Nose normal.     Mouth/Throat:     Mouth: Mucous membranes are moist.     Pharynx: Oropharynx is clear.  Eyes:     General: No scleral icterus.       Right eye: No discharge.        Left eye: No discharge.     Extraocular Movements: Extraocular movements intact.     Conjunctiva/sclera: Conjunctivae normal.     Pupils: Pupils are equal, round, and reactive to light.  Cardiovascular:     Rate and Rhythm: Normal rate and regular rhythm.     Pulses: Normal pulses.     Heart sounds: Normal heart sounds. No murmur heard.    No friction rub. No gallop.  Pulmonary:     Effort: Pulmonary effort is normal. No respiratory distress.     Breath sounds: Normal breath sounds. No stridor. No wheezing, rhonchi or rales.  Chest:     Chest wall: No tenderness.  Genitourinary:    Comments: Scrotal swelling Musculoskeletal:        General: Normal range of motion.     Cervical back: Normal range of motion and neck supple.  Skin:    General: Skin is warm and dry.     Capillary Refill: Capillary refill takes less than 2 seconds.     Coloration: Skin is not jaundiced or pale.     Findings: No bruising, erythema, lesion or rash.  Neurological:     General: No focal deficit present.     Mental Status: He is alert. Mental status is at baseline. He is disoriented.  Psychiatric:        Mood and Affect: Mood normal.        Behavior: Behavior normal.        Thought Content: Thought content normal.        Cognition and Memory: Cognition is impaired. Memory is impaired.        Judgment: Judgment normal.      Results for orders placed or performed in visit on 05/02/23  CBC with Differential/Platelet  Result Value Ref Range   WBC 4.3 3.4 - 10.8 x10E3/uL   RBC 4.57 4.14 - 5.80 x10E6/uL   Hemoglobin 13.8 13.0 - 17.7 g/dL   Hematocrit 16.1 09.6 - 51.0 %   MCV 90 79 - 97 fL   MCH 30.2 26.6 - 33.0 pg   MCHC 33.7 31.5 - 35.7 g/dL   RDW 04.5 40.9 - 81.1 %   Platelets 205 150 - 450 x10E3/uL   Neutrophils 43 Not Estab. %   Lymphs 25 Not Estab. %   Monocytes 15 Not Estab. %   Eos 16 Not Estab. %   Basos 1 Not Estab. %   Neutrophils Absolute 1.9 1.4 - 7.0 x10E3/uL   Lymphocytes Absolute 1.1 0.7 - 3.1 x10E3/uL   Monocytes Absolute 0.6 0.1 - 0.9 x10E3/uL   EOS (ABSOLUTE) 0.7 (H) 0.0 - 0.4 x10E3/uL   Basophils Absolute 0.0 0.0 - 0.2 x10E3/uL   Immature Granulocytes 0 Not Estab. %   Immature Grans (Abs) 0.0 0.0 - 0.1 x10E3/uL  Comprehensive metabolic panel  Result Value Ref Range   Glucose 83 70 - 99 mg/dL   BUN 11 8 - 27 mg/dL   Creatinine, Ser 9.14 0.76 - 1.27 mg/dL   eGFR 58 (L) >78 GN/FAO/1.30   BUN/Creatinine Ratio 9 (L) 10 - 24   Sodium 139 134 - 144 mmol/L   Potassium 4.2 3.5 - 5.2 mmol/L   Chloride 103 96 - 106 mmol/L   CO2 23 20 - 29 mmol/L   Calcium 9.0 8.6 - 10.2 mg/dL   Total Protein 7.4 6.0 - 8.5 g/dL   Albumin 3.6 (L) 3.8 - 4.8 g/dL   Globulin, Total 3.8 1.5 - 4.5 g/dL   Bilirubin Total 0.5 0.0 - 1.2 mg/dL   Alkaline Phosphatase 94 44 - 121 IU/L   AST 20 0 - 40 IU/L   ALT 6 0 - 44 IU/L  Lipid Panel w/o Chol/HDL Ratio  Result Value Ref Range   Cholesterol, Total 183 100 - 199 mg/dL   Triglycerides 97 0 - 149 mg/dL   HDL 68 >86 mg/dL   VLDL Cholesterol Cal 17 5 - 40 mg/dL   LDL Chol Calc (NIH) 98 0 - 99 mg/dL  PSA  Result Value Ref Range   Prostate Specific Ag, Serum 0.1 0.0 - 4.0 ng/mL      Assessment & Plan:   Problem List  Items Addressed This Visit       Cardiovascular and Mediastinum   Essential hypertension, benign    Not under good control. Unclear  if he has been taking his medicine. Will get him back with his guardian and all his medicines and adjust medicine as needed.      Relevant Orders   CBC with Differential/Platelet (Completed)   Comprehensive metabolic panel (Completed)   AMB Referral to Chronic Care Management Services   DVT (deep venous thrombosis) (HCC)    Under good control on current regimen. Continue current regimen. Continue to monitor. Call with any concerns. Refills given. Labs drawn today.        Relevant Orders   AMB Referral to Chronic Care Management Services   CAD (coronary artery disease) - Primary    Will keep BP and cholesterol under good control. Continue to monitor. Call with any concerns.      Relevant Orders   CBC with Differential/Platelet (Completed)   Comprehensive metabolic panel (Completed)   AMB Referral to Chronic Care Management Services     Nervous and Auditory   Dementia without behavioral disturbance (HCC)    Not doing well at this time. Has been wandering. His brother has not been doing well and can no longer care for him. Will get CCM involved to see about getting extra help.       Relevant Orders   AMB Referral to Chronic Care Management Services     Genitourinary   Encysted hydrocele    He notes that his scrotum is hurting- we will get him back into see urology. Call with any concerns.       Relevant Orders   Ambulatory referral to Urology   Prostate cancer [facility])    Rechecking labs today. Await results.       Relevant Orders   PSA (Completed)   AMB Referral to Chronic Care Management Services     Other   Hypercholesteremia    Rechecking labs today. Await results. Treat as needed.      Relevant Orders   CBC with Differential/Platelet (Completed)   Comprehensive metabolic panel (Completed)   Lipid Panel w/o Chol/HDL Ratio (Completed)   AMB Referral to Chronic Care Management Services     Follow up plan: Return in about 2 weeks (around 05/16/2023) for With his  guardian.

## 2023-05-03 ENCOUNTER — Telehealth: Payer: Self-pay

## 2023-05-03 LAB — CBC WITH DIFFERENTIAL/PLATELET
Basophils Absolute: 0 10*3/uL (ref 0.0–0.2)
Basos: 1 %
EOS (ABSOLUTE): 0.7 10*3/uL — ABNORMAL HIGH (ref 0.0–0.4)
Eos: 16 %
Hematocrit: 40.9 % (ref 37.5–51.0)
Hemoglobin: 13.8 g/dL (ref 13.0–17.7)
Immature Grans (Abs): 0 10*3/uL (ref 0.0–0.1)
Immature Granulocytes: 0 %
Lymphocytes Absolute: 1.1 10*3/uL (ref 0.7–3.1)
Lymphs: 25 %
MCH: 30.2 pg (ref 26.6–33.0)
MCHC: 33.7 g/dL (ref 31.5–35.7)
MCV: 90 fL (ref 79–97)
Monocytes Absolute: 0.6 10*3/uL (ref 0.1–0.9)
Monocytes: 15 %
Neutrophils Absolute: 1.9 10*3/uL (ref 1.4–7.0)
Neutrophils: 43 %
Platelets: 205 10*3/uL (ref 150–450)
RBC: 4.57 x10E6/uL (ref 4.14–5.80)
RDW: 13.5 % (ref 11.6–15.4)
WBC: 4.3 10*3/uL (ref 3.4–10.8)

## 2023-05-03 LAB — LIPID PANEL W/O CHOL/HDL RATIO
Cholesterol, Total: 183 mg/dL (ref 100–199)
HDL: 68 mg/dL (ref >39)
LDL Chol Calc (NIH): 98 mg/dL (ref 0–99)
Triglycerides: 97 mg/dL (ref 0–149)
VLDL Cholesterol Cal: 17 mg/dL (ref 5–40)

## 2023-05-03 LAB — COMPREHENSIVE METABOLIC PANEL
ALT: 6 IU/L (ref 0–44)
AST: 20 IU/L (ref 0–40)
Albumin: 3.6 g/dL — ABNORMAL LOW (ref 3.8–4.8)
Alkaline Phosphatase: 94 IU/L (ref 44–121)
BUN/Creatinine Ratio: 9 — ABNORMAL LOW (ref 10–24)
BUN: 11 mg/dL (ref 8–27)
Bilirubin Total: 0.5 mg/dL (ref 0.0–1.2)
CO2: 23 mmol/L (ref 20–29)
Calcium: 9 mg/dL (ref 8.6–10.2)
Chloride: 103 mmol/L (ref 96–106)
Creatinine, Ser: 1.27 mg/dL (ref 0.76–1.27)
Globulin, Total: 3.8 g/dL (ref 1.5–4.5)
Glucose: 83 mg/dL (ref 70–99)
Potassium: 4.2 mmol/L (ref 3.5–5.2)
Sodium: 139 mmol/L (ref 134–144)
Total Protein: 7.4 g/dL (ref 6.0–8.5)
eGFR: 58 mL/min/{1.73_m2} — ABNORMAL LOW (ref >59)

## 2023-05-03 LAB — PSA: Prostate Specific Ag, Serum: 0.1 ng/mL (ref 0.0–4.0)

## 2023-05-03 NOTE — Progress Notes (Signed)
  Chronic Care Management   Note  05/03/2023 Name: Ronnie Bullock MRN: [id number] DOB: [birth year]  [name] is a 77 y.o. year old male who is a primary care patient of Dorcas Carrow, DO. I reached out to [employer] by phone today in response to a referral sent by Mr. Johann Capers Mcnab's PCP.  The first contact attempt was unsuccessful.   Follow up plan: Additional outreach attempts will be made.  Penne Lash, RMA Care Guide [address] [facility]  [place], [place] [address] Direct Dial: [phone] [email or web address]

## 2023-05-05 NOTE — Progress Notes (Signed)
  Chronic Care Management   Note  05/05/2023 Name: Ronnie Bullock MRN: [id number] DOB: 09/12/1946  Ronnie Bullock is a 77 y.o. year old male who is a primary care patient of Dorcas Carrow, DO. I reached out to [employer] by phone today in response to a referral sent by Ronnie Bullock PCP.  Ronnie Bullock was given information about Chronic Care Management services today including:  CCM service includes personalized support from designated clinical staff supervised by the physician, including individualized plan of care and coordination with other care providers 24/7 contact phone numbers for assistance for urgent and routine care needs. Service will only be billed when office clinical staff spend 20 minutes or more in a month to coordinate care. Only one practitioner may furnish and bill the service in a calendar month. The patient may stop CCM services at amy time (effective at the end of the month) by phone call to the office staff. The patient will be responsible for cost sharing (co-pay) or up to 20% of the service fee (after annual deductible is met)  Ronnie Bullock  agreedto scheduling an appointment with the CCM RN Case Manager   Follow up plan: Patient agreed to scheduled appointment with RN Case Manager on 05/12/2023 Pharm d 05/22/2023(date/time).   Penne Lash, RMA Care Guide [facility]  [place], [place] [address] Direct Dial: [phone] [email or web address]

## 2023-05-07 NOTE — Assessment & Plan Note (Signed)
Rechecking labs today. Await results.  

## 2023-05-07 NOTE — Assessment & Plan Note (Signed)
Not doing well at this time. Has been wandering. His brother has not been doing well and can no longer care for him. Will get CCM involved to see about getting extra help.

## 2023-05-07 NOTE — Assessment & Plan Note (Addendum)
Not under good control. Unclear if he has been taking his medicine. Will get him back with his guardian and all his medicines and adjust medicine as needed.

## 2023-05-07 NOTE — Assessment & Plan Note (Signed)
Will keep BP and cholesterol under good control. Continue to monitor. Call with any concerns.  

## 2023-05-07 NOTE — Assessment & Plan Note (Signed)
Rechecking labs today. Await results. Treat as needed.  °

## 2023-05-07 NOTE — Assessment & Plan Note (Signed)
Under good control on current regimen. Continue current regimen. Continue to monitor. Call with any concerns. Refills given. Labs drawn today.   

## 2023-05-07 NOTE — Assessment & Plan Note (Signed)
He notes that his scrotum is hurting- we will get him back into see urology. Call with any concerns.

## 2023-05-09 ENCOUNTER — Telehealth: Payer: Self-pay | Admitting: Family Medicine

## 2023-05-09 ENCOUNTER — Ambulatory Visit: Payer: Self-pay | Admitting: *Deleted

## 2023-05-09 ENCOUNTER — Telehealth: Payer: Self-pay | Admitting: *Deleted

## 2023-05-09 NOTE — Telephone Encounter (Signed)
Copied from CRM [id number]. Topic: Appointment Scheduling - Scheduling Inquiry for Clinic >> May 09, 2023  3:48 PM Ronnie Bullock wrote: Reason for CRM: Sister Dawn returned the call for his appt. She was out of the room when the call came in.

## 2023-05-09 NOTE — Patient Outreach (Signed)
  Care Coordination   05/09/2023 Name: EASTIN WHITELOW MRN: [id number] DOB: [birth year]   Care Coordination Outreach Attempts:  An unsuccessful telephone outreach was attempted for a scheduled appointment today.  Follow Up Plan:  Additional outreach attempts will be made to offer the patient care coordination information and services.   Encounter Outcome:  No Answer   Care Coordination Interventions:  No, not indicated    Dory Verdun, LCSW Clinical Social Worker  [facility] Care Management [phone]

## 2023-05-10 ENCOUNTER — Ambulatory Visit: Payer: Self-pay | Admitting: *Deleted

## 2023-05-10 NOTE — Patient Outreach (Signed)
  Care Coordination   Follow Up Visit Note   05/10/2023 Name: KEYJUAN TIANO MRN: [id number] DOB: 06-15-1946  [name] is a 77 y.o. year old male who sees Dorcas Carrow, DO for primary care. I spoke with  [name] by phone today.  What matters to the patients health and wellness today?  Personal Care Services, MedAlert system    Goals Addressed             This Visit's Progress    In home care resources       Interventions Today    Flowsheet Row Most Recent Value  Chronic Disease   Chronic disease during today's visit Other  [dementia]  General Interventions   General Interventions Discussed/Reviewed General Interventions Reviewed, Community Resources, Level of Care, Doctor Visits  Doctor Visits Discussed/Reviewed PCP  [facility] 05/22/23]  Level of Care Personal Care Services  [patient's sister confirms continued interest in private duty care-resources provided, sister agreeable to call]  Safety Interventions   Safety Discussed/Reviewed Safety Reviewed  [contact information for MedAlert provided-[phone]-patient's sister to contact-also discussed financial payee option, sister will consider this option]              SDOH assessments and interventions completed:  No     Care Coordination Interventions:  Yes, provided   Follow up plan: Follow up call scheduled for 05/26/23    Encounter Outcome:  Pt. Visit Completed

## 2023-05-10 NOTE — Patient Instructions (Signed)
Visit Information  Thank you for taking time to visit with me today. Please don't hesitate to contact me if I can be of assistance to you.   Following are the goals we discussed today:  Please contact the personal care agencies provided to discuss in home care options for patient [place] contact the MedAlert program to discuss in home safety options [phone]   Our next appointment is by telephone on 05/24/23 at 10am  Please call the care guide team at [phone] if you need to cancel or reschedule your appointment.   If you are experiencing a Mental Health or Behavioral Health Crisis or need someone to talk to, please call 911   Patient verbalizes understanding of instructions and care plan provided today and agrees to view in MyChart. Active MyChart status and patient understanding of how to access instructions and care plan via MyChart confirmed with patient.     Telephone follow up appointment with care management team member scheduled for: 05/24/23  Verna Czech, LCSW Clinical Social Worker  [facility] Care Management [phone]

## 2023-05-10 NOTE — Patient Outreach (Signed)
  Care Coordination   Initial Visit Note   05/10/2023 Late Entry Name: Ronnie Bullock MRN: [id number] DOB: 05-02-1946  [name] is a 77 y.o. year old male who sees Dorcas Carrow, DO for primary care. I spoke with  Ronnie Bullock's sister Ronnie Bullock by phone on 05/10/23.  What matters to the patients health and wellness today?  Patient's sister would like resources to assist patient with ADL's and medication reminders. Patient resides with brother who is unable to assist due to his own medical condition. Patient's sister willing to consider private duty care-resources to be provided.    Goals Addressed             This Visit's Progress    In home care resources       Interventions Today    Flowsheet Row Most Recent Value  Chronic Disease   Chronic disease during today's visit Other  [dementia]  General Interventions   General Interventions Discussed/Reviewed General Interventions Discussed, Community Resources, Level of Care  [confirmed that pt resides with bro, however needs assistance wht meals, ADL's and medication reminders. Bro. now ill and unable to assist. Church members provide transportation to medical appointments]  Level of Care Assisted Living, Personal Care Services  [ALF placement discussed, sister would like to keep patient in his own home for as long as possible-interested in personal care services, agency list to be provided, explained that this option would be private pay]  Nutrition Interventions   Nutrition Discussed/Reviewed Nutrition Discussed  [assistance with meal prep discussed as a option with private duty care]  Safety Interventions   Safety Discussed/Reviewed Safety Discussed  [Wanders from the home often, med alert discussed for tracking purposes]              SDOH assessments and interventions completed:  Yes  SDOH Interventions Today    Flowsheet Row Most Recent Value  SDOH Interventions   Food Insecurity Interventions Intervention Not  Indicated  Transportation Interventions Intervention Not Indicated  [Pastor provides transportation, used [employer] rides in the past but has used benefit for the year]        Care Coordination Interventions:  Yes, provided   Follow up plan: Follow up call scheduled for 05/10/23    Encounter Outcome:  Pt. Visit Completed

## 2023-05-10 NOTE — Patient Instructions (Signed)
Visit Information  Thank you for taking time to visit with me today. Please don't hesitate to contact me if I can be of assistance to you.   Following are the goals we discussed today:  Patient's sister to review private duty care options once received   Our next appointment is by telephone on 05/10/23 at 12pm  Please call the care guide team at [phone] if you need to cancel or reschedule your appointment.   If you are experiencing a Mental Health or Behavioral Health Crisis or need someone to talk to, please call 911   Patient verbalizes understanding of instructions and care plan provided today and agrees to view in MyChart. Active MyChart status and patient understanding of how to access instructions and care plan via MyChart confirmed with patient.     Telephone follow up appointment with care management team member scheduled for: 05/10/23  Verna Czech, LCSW Clinical Social Worker  [facility] Care Management [phone]

## 2023-05-12 ENCOUNTER — Telehealth: Payer: Medicare HMO

## 2023-05-12 ENCOUNTER — Telehealth: Payer: Self-pay

## 2023-05-12 NOTE — Telephone Encounter (Signed)
   CCM RN Visit Note   05-12-2023 Name: Ronnie Bullock MRN: [id number]      DOB: Apr 05, 1946  Subjective: Ronnie Bullock is a 77 y.o. year old male who is a primary care patient of Dr. Olevia Perches. The patient was referred to the Chronic Care Management team for assistance with care management needs subsequent to provider initiation of CCM services and plan of care.      An unsuccessful telephone outreach was attempted today to contact the patient about Chronic Care Management needs.    Plan:A HIPAA compliant phone message was left for the patient providing contact information and requesting a return call.  Alto Denver RN, MSN, CCM RN Care Manager  Chronic Care Management Direct Number: [phone]

## 2023-05-16 ENCOUNTER — Telehealth: Payer: Self-pay

## 2023-05-16 NOTE — Progress Notes (Signed)
  Chronic Care Management Note  05/16/2023 Name: Ronnie Bullock MRN: [id number] DOB: [birth year]  Ronnie Bullock is a 77 y.o. year old male who is a primary care patient of Dorcas Carrow, DO and is actively engaged with the Chronic Care Management team. I reached out to Ronnie Bullock by phone today to assist with re-scheduling an initial visit with the RN Case Manager  Follow up plan: Unsuccessful telephone outreach attempt made. A HIPAA compliant phone message was left for the patient providing contact information and requesting a return call.  The care management team will reach out to the patient again over the next 7 days.  If patient returns call to provider office, please advise to call CCM Care Guide Penne Lash  at [phone] Penne Lash, RMA Care Guide [facility]  [place], [place] [address] Direct Dial: [phone] [email or web address]

## 2023-05-22 ENCOUNTER — Ambulatory Visit: Payer: Medicare HMO | Admitting: Family Medicine

## 2023-05-22 ENCOUNTER — Telehealth: Payer: Self-pay | Admitting: Family Medicine

## 2023-05-22 ENCOUNTER — Ambulatory Visit (INDEPENDENT_AMBULATORY_CARE_PROVIDER_SITE_OTHER): Payer: Medicare HMO

## 2023-05-22 ENCOUNTER — Encounter: Payer: Self-pay | Admitting: *Deleted

## 2023-05-22 DIAGNOSIS — F0390 Unspecified dementia without behavioral disturbance: Secondary | ICD-10-CM

## 2023-05-22 DIAGNOSIS — I10 Essential (primary) hypertension: Secondary | ICD-10-CM

## 2023-05-22 NOTE — Chronic Care Management (AMB) (Signed)
Chronic Care Management   CCM RN Visit Note  05/22/2023 Name: Ronnie Bullock MRN: [id number] DOB: Apr 04, 1946  Subjective: Ronnie Bullock is a 77 y.o. year old male who is a primary care patient of Dorcas Carrow, DO. The patient was referred to the Chronic Care Management team for assistance with care management needs subsequent to provider initiation of CCM services and plan of care.    Today's Visit:   spoke to the patients sister, Ranelle Oyster who is also POA  for initial visit.        Goals Addressed             This Visit's Progress    COMPLETED: CCM Expected Outcome:  Monitor, Self-Manage and Reduce Symptoms of  transportation difficulties       Needs help getting a ride to and from appointments     CCM Expected Outcome:  Monitor, Self-Manage and Reduce Symptoms of: Dementia       Current Barriers:  Knowledge Deficits related to Decline in mental capacity, increased wandering, family member illness, support and educational needs for family and friends with patient with advanced dementia Care Coordination needs related to resources for help with memory changes and more care in the home in a patient with dementia Chronic Disease Management support and education needs related to effective management of dementia Lacks caregiver support.  Cognitive Deficits  Planned Interventions: Evaluation of current treatment plan related to dementia and memory changes and patient's adherence to plan as established by provider Advised patient to sister to reach out to the resources given to her by the SW. The patient has increased his incidence of leaving the home and being gone for long periods of time and nobody knows where he is. Education provided to the patients sister today.  Provided education to patient re: the concerns of the pcp and staff about the welfare of the patient and the changes noted in recent months. The patient is wandering more and his brother is not able to  participate in his care due to his own illness.  Reviewed medications with patient and discussed compliance. There is reason to believe the patient is not taking his medications as directed. Upcoming appointment with the pharm D Collaborated with pcp and LCSW regarding the patients missed appointment and wellness check. The writer was able to talk with the patients sister who feels the patient is safe. They have the pastor and others looking for him at his usual hangout spots. He has certain areas he likes to go. Encouraged the patients sister to have a talk with her brother and figure out the best solution to helping the patient. Let the pcp know that the sister has others looking for him. The patient missed his MD appointment today.  Reviewed scheduled/upcoming provider appointments including missed appointment today. Will continue to collaborate with the pcp and staff Social Work referral for support and education for dementia support Pharmacy referral for medication management and support  Discussed plans with patient for ongoing care management follow up and provided patient with direct contact information for care management team Advised patient to discuss changes in memory, changes in behaviors, questions and concerns with provider Screening for signs and symptoms of depression related to chronic disease state  Assessed social determinant of health barriers  Symptom Management: Take medications as prescribed   Attend all scheduled provider appointments Call provider office for new concerns or questions  call the Suicide and Crisis Lifeline: 988 call the [place] National  Suicide Prevention Lifeline: 2398154431 or TTY: 575-513-5219 TTY [phone]) to talk to a trained counselor call 1-800-273-TALK (toll free, 24 hour hotline) if experiencing a Mental Health or Behavioral Health Crisis   Follow Up Plan: Telephone follow up appointment with care management team member scheduled for:  06-06-2023 at 1145 am       CCM Expected Outcome:  Monitor, Self-Manage, and Reduce Symptoms of Hypertension       Current Barriers:  Knowledge Deficits related to keeping appointments with the pcp, following the plan of care, and taking medications as prescribed  Care Coordination needs related to patients wandering and leaving the home and sometimes not returning to late, brother who he resides with is ill, sister lives in [place] who is POA in a patient with HTN and other chronic conditions Chronic Disease Management support and education needs related to Effective management of HTN Lacks caregiver support.  Cognitive Deficits  Planned Interventions: Evaluation of current treatment plan related to hypertension self management and patient's adherence to plan as established by provider;   Provided education to patient re: stroke prevention, s/s of heart attack and stroke; Reviewed prescribed diet heart healthy diet  Reviewed medications with patient and discussed importance of compliance;  Discussed plans with patient for ongoing care management follow up and provided patient with direct contact information for care management team; Advised patient, providing education and rationale, to monitor blood pressure daily and record, calling PCP for findings outside established parameters;  Reviewed scheduled/upcoming provider appointments including: The patient had an appointment today with the pcp but missed the appointment. Collaboration with the pcp with secure chat after speaking to the patients sister  Advised patient to discuss changes in HTN or heart health with provider; Provided education on prescribed diet heart healthy;  Discussed complications of poorly controlled blood pressure such as heart disease, stroke, circulatory complications, vision complications, kidney impairment, sexual dysfunction;  Screening for signs and symptoms of depression related to chronic disease state;  Assessed social  determinant of health barriers;   Symptom Management: Take medications as prescribed   Attend all scheduled provider appointments Call provider office for new concerns or questions  call the Suicide and Crisis Lifeline: 988 call the [place] National Suicide Prevention Lifeline: (848)840-0466 or TTY: 423-765-9598 TTY 310 429 3848) to talk to a trained counselor call 1-800-273-TALK (toll free, 24 hour hotline) if experiencing a Mental Health or Behavioral Health Crisis  check blood pressure weekly learn about high blood pressure call doctor for signs and symptoms of high blood pressure keep all doctor appointments take medications for blood pressure exactly as prescribed report new symptoms to your doctor  Follow Up Plan: Telephone follow up appointment with care management team member scheduled for: 06-06-2023 at 1145 am          Plan:Telephone follow up appointment with care management team member scheduled for:  06-06-2023 at 1145 am  Alto Denver RN, MSN, CCM RN Care Manager  Chronic Care Management Direct Number: [phone]

## 2023-05-22 NOTE — Telephone Encounter (Signed)
Copied from CRM [id number]. Topic: Appointment Scheduling - Scheduling Inquiry for Clinic >> May 22, 2023  2:07 PM Phill Myron wrote: Ms Clovis Riley called and stated Patient walked away about 7am this morning. She will call back later to reschedule his appointment.     Msg to Dr Laural Benes Ms Clovis Riley also stated, "She realizes how help is needed and is so very important".

## 2023-05-22 NOTE — Patient Instructions (Signed)
Please call the care guide team at [phone] if you need to cancel or reschedule your appointment.   If you are experiencing a Mental Health or Behavioral Health Crisis or need someone to talk to, please call the Suicide and Crisis Lifeline: 988 call the [place] National Suicide Prevention Lifeline: 754-050-8434 or TTY: 423-797-9923 TTY 5738205827) to talk to a trained counselor call 1-800-273-TALK (toll free, 24 hour hotline)   Following is a copy of the CCM Program Consent:  CCM service includes personalized support from designated clinical staff supervised by the physician, including individualized plan of care and coordination with other care providers 24/7 contact phone numbers for assistance for urgent and routine care needs. Service will only be billed when office clinical staff spend 20 minutes or more in a month to coordinate care. Only one practitioner may furnish and bill the service in a calendar month. The patient may stop CCM services at amy time (effective at the end of the month) by phone call to the office staff. The patient will be responsible for cost sharing (co-pay) or up to 20% of the service fee (after annual deductible is met)  Following is a copy of your full provider care plan:   Goals Addressed             This Visit's Progress    COMPLETED: CCM Expected Outcome:  Monitor, Self-Manage and Reduce Symptoms of  transportation difficulties       Needs help getting a ride to and from appointments     CCM Expected Outcome:  Monitor, Self-Manage and Reduce Symptoms of: Dementia       Current Barriers:  Knowledge Deficits related to Decline in mental capacity, increased wandering, family member illness, support and educational needs for family and friends with patient with advanced dementia Care Coordination needs related to resources for help with memory changes and more care in the home in a patient with dementia Chronic Disease Management support and education  needs related to effective management of dementia Lacks caregiver support.  Cognitive Deficits  Planned Interventions: Evaluation of current treatment plan related to dementia and memory changes and patient's adherence to plan as established by provider Advised patient to sister to reach out to the resources given to her by the SW. The patient has increased his incidence of leaving the home and being gone for long periods of time and nobody knows where he is. Education provided to the patients sister today.  Provided education to patient re: the concerns of the pcp and staff about the welfare of the patient and the changes noted in recent months. The patient is wandering more and his brother is not able to participate in his care due to his own illness.  Reviewed medications with patient and discussed compliance. There is reason to believe the patient is not taking his medications as directed. Upcoming appointment with the pharm D Collaborated with pcp and LCSW regarding the patients missed appointment and wellness check. The writer was able to talk with the patients sister who feels the patient is safe. They have the pastor and others looking for him at his usual hangout spots. He has certain areas he likes to go. Encouraged the patients sister to have a talk with her brother and figure out the best solution to helping the patient. Let the pcp know that the sister has others looking for him. The patient missed his MD appointment today.  Reviewed scheduled/upcoming provider appointments including missed appointment today. Will continue to collaborate with  the pcp and staff Social Work referral for support and education for dementia support Pharmacy referral for medication management and support  Discussed plans with patient for ongoing care management follow up and provided patient with direct contact information for care management team Advised patient to discuss changes in memory, changes in  behaviors, questions and concerns with provider Screening for signs and symptoms of depression related to chronic disease state  Assessed social determinant of health barriers  Symptom Management: Take medications as prescribed   Attend all scheduled provider appointments Call provider office for new concerns or questions  call the Suicide and Crisis Lifeline: 988 call the [place] National Suicide Prevention Lifeline: 669-408-8419 or TTY: 806-051-2126 TTY (680)715-2159) to talk to a trained counselor call 1-800-273-TALK (toll free, 24 hour hotline) if experiencing a Mental Health or Behavioral Health Crisis   Follow Up Plan: Telephone follow up appointment with care management team member scheduled for: 06-06-2023 at 1145 am       CCM Expected Outcome:  Monitor, Self-Manage, and Reduce Symptoms of Hypertension       Current Barriers:  Knowledge Deficits related to keeping appointments with the pcp, following the plan of care, and taking medications as prescribed  Care Coordination needs related to patients wandering and leaving the home and sometimes not returning to late, brother who he resides with is ill, sister lives in [place] who is POA in a patient with HTN and other chronic conditions Chronic Disease Management support and education needs related to Effective management of HTN Lacks caregiver support.  Cognitive Deficits  Planned Interventions: Evaluation of current treatment plan related to hypertension self management and patient's adherence to plan as established by provider;   Provided education to patient re: stroke prevention, s/s of heart attack and stroke; Reviewed prescribed diet heart healthy diet  Reviewed medications with patient and discussed importance of compliance;  Discussed plans with patient for ongoing care management follow up and provided patient with direct contact information for care management team; Advised patient, providing education and rationale, to monitor  blood pressure daily and record, calling PCP for findings outside established parameters;  Reviewed scheduled/upcoming provider appointments including: The patient had an appointment today with the pcp but missed the appointment. Collaboration with the pcp with secure chat after speaking to the patients sister  Advised patient to discuss changes in HTN or heart health with provider; Provided education on prescribed diet heart healthy;  Discussed complications of poorly controlled blood pressure such as heart disease, stroke, circulatory complications, vision complications, kidney impairment, sexual dysfunction;  Screening for signs and symptoms of depression related to chronic disease state;  Assessed social determinant of health barriers;   Symptom Management: Take medications as prescribed   Attend all scheduled provider appointments Call provider office for new concerns or questions  call the Suicide and Crisis Lifeline: 988 call the [place] National Suicide Prevention Lifeline: 517-128-1481 or TTY: (306)643-7867 TTY 914-212-7999) to talk to a trained counselor call 1-800-273-TALK (toll free, 24 hour hotline) if experiencing a Mental Health or Behavioral Health Crisis  check blood pressure weekly learn about high blood pressure call doctor for signs and symptoms of high blood pressure keep all doctor appointments take medications for blood pressure exactly as prescribed report new symptoms to your doctor  Follow Up Plan: Telephone follow up appointment with care management team member scheduled for: 06-06-2023 at 1145 am          Patient verbalizes understanding of instructions and care plan provided today and  agrees to view in [place]. Active MyChart status and patient understanding of how to access instructions and care plan via MyChart confirmed with patient.  Telephone follow up appointment with care management team member scheduled for: 06-06-2023 at 1145 am

## 2023-05-22 NOTE — Plan of Care (Signed)
Chronic Care Management Provider Comprehensive Care Plan    05/22/2023 Name: Ronnie Bullock MRN: [id number] DOB: [birth year]  Referral to Chronic Care Management (CCM) services was placed by Provider:  Dr. Olevia Perches on Date: 05-02-2023.  Chronic Condition 1: Dementia Provider Assessment and Plan  Not doing well at this time. Has been wandering. His brother has not been doing well and can no longer care for him. Will get CCM involved to see about getting extra help.          Relevant Orders    AMB Referral to Chronic Care Management Services     Expected Outcome/Goals Addressed This Visit (Provider CCM goals/Provider Assessment and plan   CCM (Dementia)  EXPECTED OUTCOME:  MONITOR,SELF- MANAGE AND REDUCE SYMPTOMS OF Dementia   Symptom Management Condition 1: Take all medications as prescribed Attend all scheduled provider appointments Call provider office for new concerns or questions  call the Suicide and Crisis Lifeline: 988 call the [place] National Suicide Prevention Lifeline: 812 073 4547 or TTY: (367)836-4033 TTY (267)549-9830) to talk to a trained counselor call 1-800-273-TALK (toll free, 24 hour hotline) if experiencing a Mental Health or Behavioral Health Crisis   Chronic Condition 2: HTN Provider Assessment and Plan  Not under good control. Unclear if he has been taking his medicine. Will get him back with his guardian and all his medicines and adjust medicine as needed.         Relevant Orders    CBC with Differential/Platelet (Completed)    Comprehensive metabolic panel (Completed)    AMB Referral to Chronic Care Management Services     Expected Outcome/Goals Addressed This Visit (Provider CCM goals/Provider Assessment and plan   CCM (HYPERTENSION)  EXPECTED OUTCOME:  MONITOR,SELF- MANAGE AND REDUCE SYMPTOMS OF HYPERTENSION   Symptom Management Condition 2: Take all medications as prescribed Attend all scheduled provider appointments Call provider office  for new concerns or questions  call the Suicide and Crisis Lifeline: 988 call the [place] National Suicide Prevention Lifeline: 607-098-5526 or TTY: (929)866-3805 TTY (209) 010-6925) to talk to a trained counselor call 1-800-273-TALK (toll free, 24 hour hotline) if experiencing a Mental Health or Behavioral Health Crisis  check blood pressure weekly learn about high blood pressure keep a blood pressure log take blood pressure log to all doctor appointments call doctor for signs and symptoms of high blood pressure keep all doctor appointments take medications for blood pressure exactly as prescribed report new symptoms to your doctor  Problem List Patient Active Problem List   Diagnosis Date Noted   Transportation insecurity 07/12/2022   Seasonal allergic rhinitis due to pollen 10/22/2021   CAD (coronary artery disease) 08/24/2021   Demand ischemia 02/12/2021   Dementia without behavioral disturbance (HCC) 01/21/2021   DVT (deep venous thrombosis) (HCC) 01/20/2021   Prostate cancer (HCC) 07/03/2019   Encysted hydrocele 03/28/2019   Benign neoplasm of ascending colon    Benign neoplasm of cecum    Hypercholesteremia 05/02/2018   Advanced care planning/counseling discussion 11/10/2017   Essential hypertension, benign 10/10/2017    Medication Management  Current Outpatient Medications:    amLODipine (NORVASC) 5 MG tablet, TAKE ONE TABLET BY MOUTH EVERY MORNING, Disp: 90 tablet, Rfl: 0   apixaban (ELIQUIS) 5 MG TABS tablet, TAKE ONE TABLET BY MOUTH AT BREAKFAST AND AT BEDTIME, Disp: 180 tablet, Rfl: 0   donepezil (ARICEPT) 5 MG tablet, TAKE ONE TABLET BY MOUTH EVERYDAY AT BEDTIME, Disp: 90 tablet, Rfl: 0   fluticasone (FLONASE) 50 MCG/ACT nasal spray, Instill  two SPRAYS into each nostril daily, Disp: 16 g, Rfl: 0   Multiple Vitamin (MULTIVITAMIN WITH MINERALS) TABS tablet, Take 1 tablet by mouth daily., Disp: , Rfl:    tamsulosin (FLOMAX) 0.4 MG CAPS capsule, TAKE ONE CAPSULE BY MOUTH  EVERYDAY AT BEDTIME, Disp: 90 capsule, Rfl: 0  Cognitive Assessment Identity Confirmed: : Name; DOB Cognitive Status: Abnormal Other:  : HIPPA identifiers verified by patients sister Dawn   Functional Assessment Hearing Difficulty or Deaf: no Wear Glasses or Blind: no Concentrating, Remembering or Making Decisions Difficulty (CP): yes Concentration Management: Memory changes, leaves the home and does not come back for hours, non-compliant with the plan of care Difficulty Communicating: no Difficulty Eating/Swallowing: no Walking or Climbing Stairs Difficulty: no Dressing/Bathing Difficulty: no Doing Errands Independently Difficulty (such as shopping) (CP): yes Errands Management: walks and goes places but depends on his family and friends for support and getting needs met   Caregiver Assessment  Primary Source of Support/Comfort: sibling(s) Name of Support/Comfort Primary Source: Ranelle Oyster- sister and Pamala Hurry- brother People in Home: sibling(s) Family Caregiver if Needed: sibling(s) Family Caregiver Names: Dawn and Software engineer Primary Roles/Responsibilities: retired Concerns About Impact on Relationships: the patient is wandering off more and more and stays gone for long periods of time, his family do not believe he is in danger   Planned Interventions  Evaluation of current treatment plan related to dementia and memory changes and patient's adherence to plan as established by provider Advised patient to sister to reach out to the resources given to her by the SW. The patient has increased his incidence of leaving the home and being gone for long periods of time and nobody knows where he is. Education provided to the patients sister today.  Provided education to patient re: the concerns of the pcp and staff about the welfare of the patient and the changes noted in recent months. The patient is wandering more and his brother is not able to participate in his care due to his own  illness.  Reviewed medications with patient and discussed compliance. There is reason to believe the patient is not taking his medications as directed. Upcoming appointment with the pharm D Collaborated with pcp and LCSW regarding the patients missed appointment and wellness check. The writer was able to talk with the patients sister who feels the patient is safe. They have the pastor and others looking for him at his usual hangout spots. He has certain areas he likes to go. Encouraged the patients sister to have a talk with her brother and figure out the best solution to helping the patient. Let the pcp know that the sister has others looking for him. The patient missed his MD appointment today.  Reviewed scheduled/upcoming provider appointments including missed appointment today. Will continue to collaborate with the pcp and staff Social Work referral for support and education for dementia support Pharmacy referral for medication management and support  Discussed plans with patient for ongoing care management follow up and provided patient with direct contact information for care management team Advised patient to discuss changes in memory, changes in behaviors, questions and concerns with provider Screening for signs and symptoms of depression related to chronic disease state  Assessed social determinant of health barriers Evaluation of current treatment plan related to hypertension self management and patient's adherence to plan as established by provider;   Provided education to patient re: stroke prevention, s/s of heart attack and stroke; Reviewed prescribed diet heart healthy diet  Reviewed medications  with patient and discussed importance of compliance;  Discussed plans with patient for ongoing care management follow up and provided patient with direct contact information for care management team; Advised patient, providing education and rationale, to monitor blood pressure daily and record,  calling PCP for findings outside established parameters;  Reviewed scheduled/upcoming provider appointments including: The patient had an appointment today with the pcp but missed the appointment. Collaboration with the pcp with secure chat after speaking to the patients sister  Advised patient to discuss changes in HTN or heart health with provider; Provided education on prescribed diet heart healthy;  Discussed complications of poorly controlled blood pressure such as heart disease, stroke, circulatory complications, vision complications, kidney impairment, sexual dysfunction;  Screening for signs and symptoms of depression related to chronic disease state;  Assessed social determinant of health barriers;       Interaction and coordination with outside resources, practitioners, and providers See CCM Referral  Care Plan: Available in MyChart

## 2023-05-24 ENCOUNTER — Other Ambulatory Visit: Payer: Medicare HMO

## 2023-05-24 NOTE — Progress Notes (Signed)
   05/24/2023  Patient ID: Ronnie Bullock, male   DOB: [birth year], 77 y.o.   MRN: [id number]  Outreach attempt for scheduled telephone visit unsuccessful.  Tried to call and left HIPAA compliant voicemail on sister, Dawn's (DPR) mobile number x2.  I did leave my direct number for her to return my call but will attempt to contact her again at the first of next week if I do not hear back.  Lenna Gilford, PharmD, DPLA

## 2023-05-26 ENCOUNTER — Telehealth: Payer: Self-pay | Admitting: *Deleted

## 2023-05-26 ENCOUNTER — Encounter: Payer: Self-pay | Admitting: *Deleted

## 2023-05-26 NOTE — Patient Outreach (Signed)
  Care Coordination   05/26/2023 Name: Ronnie Bullock MRN: [id number] DOB: 1946/08/22   Care Coordination Outreach Attempts:  An unsuccessful telephone outreach was attempted today to offer the patient information about available care coordination services.  Follow Up Plan:  Additional outreach attempts will be made to offer the patient care coordination information and services.   Encounter Outcome:  No Answer   Care Coordination Interventions:  No, not indicated    Anagabriela Jokerst, LCSW Clinical Social Worker  [facility] Care Management [phone]

## 2023-05-30 ENCOUNTER — Telehealth: Payer: Self-pay | Admitting: *Deleted

## 2023-05-30 NOTE — Patient Outreach (Addendum)
  Care Coordination   Follow Up Visit Note   05/30/2023 Name: Ronnie Bullock MRN: [id number] DOB: [birth year]  Ronnie Bullock is a 77 y.o. year old male who sees Dorcas Carrow, DO for primary care. I spoke with  Ronnie Bullock sister by phone today.  What matters to the patients health and wellness today?  In home care resources and support    Goals Addressed             This Visit's Progress    In home care resources       Interventions Today    Flowsheet Row Most Recent Value  Chronic Disease   Chronic disease during today's visit Other  [dementia]  General Interventions   General Interventions Discussed/Reviewed General Interventions Reviewed, Community Resources, Level of Care  Doctor Visits Discussed/Reviewed Doctor Visits Discussed  [patient's sister agreeable to re-scheduling missed appt with PCP]  Level of Care Personal Care Services  [Pt's sister confirms contact with [facility]-discussed plan for in home aid care for pt, however care cannot start until house is exterminated(bed bugs)-plan now is to have home exterminated within the next 30 days]  Safety Interventions   Safety Discussed/Reviewed Safety Discussed  [patient's sister has been in touch with med alert program , currently working on tracking system for patient   The [facility] has also been repaired]              SDOH assessments and interventions completed:  No     Care Coordination Interventions:  Yes, provided   Follow up plan: Follow up call scheduled for 06/19/23    Encounter Outcome:  Pt. Visit Completed

## 2023-05-30 NOTE — Patient Instructions (Signed)
Visit Information  Thank you for taking time to visit with me today. Please don't hesitate to contact me if I can be of assistance to you.   Following are the goals we discussed today:  Please follow up with [facility] regarding the start of in home care following the home exterminator Pease follow up with the Med Alert program of choice to discuss options for wander guard   Our next appointment is by telephone on 06/19/23 at 2pm  Please call the care guide team at [phone] if you need to cancel or reschedule your appointment.   If you are experiencing a Mental Health or Behavioral Health Crisis or need someone to talk to, please call 911   Patient verbalizes understanding of instructions and care plan provided today and agrees to view in MyChart. Active MyChart status and patient understanding of how to access instructions and care plan via MyChart confirmed with patient.     Telephone follow up appointment with care management team member scheduled for: 06/19/23  Verna Czech, LCSW Clinical Social Worker  [facility] Care Management [phone]

## 2023-06-06 ENCOUNTER — Telehealth: Payer: Self-pay

## 2023-06-06 ENCOUNTER — Telehealth: Payer: Medicare HMO

## 2023-06-06 NOTE — Progress Notes (Signed)
   06/06/2023  Patient ID: Ronnie Bullock, male   DOB: 12-29-1945, 77 y.o.   MRN: [id number]  Outreach attempt to reschedule telephone visit missed on 7/15.  Called patient's sister, Alvis Lemmings ([place]), and left HIPAA compliant voicemail with my direct phone number.  Lenna Gilford, PharmD, DPLA

## 2023-06-06 NOTE — Telephone Encounter (Signed)
   CCM RN Visit Note   06-06-2023 Name: [name] MRN: [id number]      DOB: 04-23-1946  Subjective: Ronnie Bullock is a 77 y.o. year old male who is a primary care patient of @PCP . The patient was referred to the Chronic Care Management team for assistance with care management needs subsequent to provider initiation of CCM services and plan of care.      An unsuccessful telephone outreach was attempted today to contact the patient about Chronic Care Management needs.    Plan:A HIPAA compliant phone message was left for the patient providing contact information and requesting a return call.   Alto Denver RN, MSN, CCM RN Care Manager  Chronic Care Management Direct Number: [phone]

## 2023-06-07 DIAGNOSIS — F0390 Unspecified dementia without behavioral disturbance: Secondary | ICD-10-CM

## 2023-06-07 DIAGNOSIS — I10 Essential (primary) hypertension: Secondary | ICD-10-CM

## 2023-06-12 ENCOUNTER — Other Ambulatory Visit: Payer: Self-pay | Admitting: Family Medicine

## 2023-06-12 NOTE — Progress Notes (Signed)
  Care Coordination Note  06/09/2023 Name: TERESO PINK MRN: [id number] DOB: [birth year]  Ronnie Bullock is a 77 y.o. year old male who is a primary care patient of Dorcas Carrow, DO and is actively engaged with the Chronic Care Management team. I reached out to Ronnie Bullock by phone today to assist with re-scheduling a follow up visit with the RN Case Manager  Follow up plan: Unsuccessful telephone outreach attempt made. A HIPAA compliant phone message was left for the patient providing contact information and requesting a return call.  The care management team will reach out to the patient again over the next 7 days.  If patient returns call to provider office, please advise to call CCM Care Guide Penne Lash  at [phone]  Penne Lash, RMA Care Guide [facility]  [place], [place] [address] Direct Dial: [phone] [email or web address]

## 2023-06-13 NOTE — Telephone Encounter (Signed)
Requested Prescriptions  Pending Prescriptions Disp Refills   fluticasone (FLONASE) 50 MCG/ACT nasal spray [Pharmacy Med Name: fluticasone propionate 50 mcg/actuation nasal spray,suspension] 16 g 2    Sig: Instill two SPRAYS into each nostril daily     Ear, Nose, and Throat: Nasal Preparations - Corticosteroids Passed - 06/12/2023  9:54 AM      Passed - Valid encounter within last 12 months    Recent Outpatient Visits           1 month ago Coronary artery disease of native artery of native heart with stable angina pectoris (HCC)   [facility] [place], Megan P, DO   10 months ago Routine general medical examination at a health care facility   [facility] [place], [place] P, DO   1 year ago Dementia without behavioral disturbance [facility])   [facility] [place], Megan P, DO   1 year ago Essential hypertension, benign   [facility] [place], Megan P, DO   1 year ago Dementia without behavioral disturbance [facility])   [place] [facility] Dorcas Carrow, DO       Future Appointments             In 2 weeks Laural Benes, Oralia Rud, DO [place] [facility]

## 2023-06-19 ENCOUNTER — Ambulatory Visit: Payer: Self-pay | Admitting: *Deleted

## 2023-06-19 NOTE — Patient Instructions (Signed)
Visit Information  Thank you for taking time to visit with me today. Please don't hesitate to contact me if I can be of assistance to you.   Following are the goals we discussed today:  Please continue to reinforce adherence with all follow up appointments with patient's provider   If you are experiencing a Mental Health or Behavioral Health Crisis or need someone to talk to, please call 911   Patient verbalizes understanding of instructions and care plan provided today and agrees to view in MyChart. Active MyChart status and patient understanding of how to access instructions and care plan via MyChart confirmed with patient.     No further follow up required: patient'/patient's sister to call this Child psychotherapist with any additional community resource needs  [place], LCSW Clinical Social Worker  [facility] Care Management [phone]

## 2023-06-19 NOTE — Patient Outreach (Signed)
  Care Coordination   Follow Up Visit Note   06/19/2023 Name: Ronnie Bullock MRN: [id number] DOB: 1946-11-03  Ronnie Bullock is a 77 y.o. year old male who sees Dorcas Carrow, DO for primary care. I spoke with  Ronnie Bullock's sister Dawn by phone today.  What matters to the patients health and wellness today?  Patient's sister confirms that she would like patient to remain in the home for as long as possible. She verbalizes immediate plan to repair [facility] in the home and will assist with pest control. Reinforced need to follow up with PCP as scheduled. Further discussed options for becoming patient's payee to assist with managing patient's income. Out of home placement continues to be discussed as a option in the future to maintain patients overall care and ongoing supervision.   Goals Addressed             This Visit's Progress    In home care resources       Interventions Today    Flowsheet Row Most Recent Value  Chronic Disease   Chronic disease during today's visit Other  [dementia]  General Interventions   General Interventions Discussed/Reviewed General Interventions Reviewed, Community Resources, Level of Care  Doctor Visits Discussed/Reviewed PCP  PCP/Specialist Visits Compliance with follow-up visit  [07/03/23 PCP]  Level of Care Assisted Living, Personal Care Services  [continued to discuss in home care for patient, however bed bugs will need to be addressed as well as AC repaired before they can start(sister is working on these repairs) eventual need for assisted living discussed in the near future-discussed]  Safety Interventions   Safety Discussed/Reviewed Safety Discussed  [discussed poor social influences and options , discussed possibility of becoming a payee for patient]              SDOH assessments and interventions completed:  No     Care Coordination Interventions:  Yes, provided   Follow up plan: No further intervention required.   Encounter  Outcome:  Pt. Visit Completed

## 2023-06-21 ENCOUNTER — Ambulatory Visit: Payer: Self-pay

## 2023-06-21 NOTE — Telephone Encounter (Signed)
Patients brother called stated patient is having an urgency to urinate but he can't. He also has a rash on his chest and back and under his right arm. Brother would like for him to be seen sooner than 8/26 preferably on a Monday morning or transportation reasons. Please f/u with brother.    Chief Complaint: Urinary urgency, dribbling. Symptoms: 2 weeks ago Frequency: Above Pertinent Negatives: Patient denies any pain Disposition: [] ED /[] Urgent Care (no appt availability in office) / [x] Appointment(In office/virtual)/ []  [place] [facility]/ [] Home Care/ [] Refused Recommended Disposition /[] [facility]/ []  Follow-up with PCP Additional Notes: Pt. And brother agree with appointment.  Reason for Disposition  Urination is difficult to start (i.e., hesitancy) or straining  Answer Assessment - Initial Assessment Questions 1. SYMPTOM: "What's the main symptom you're concerned about?" (e.g., frequency, incontinence)     Dribbling urine, urgency 2. ONSET: "When did the    start?"     Last week 3. PAIN: "Is there any pain?" If Yes, ask: "How bad is it?" (Scale: 1-10; mild, moderate, severe)     None 4. CAUSE: "What do you think is causing the symptoms?"     Unsure 5. OTHER SYMPTOMS: "Do you have any other symptoms?" (e.g., blood in urine, fever, flank pain, pain with urination)     Rash on arm and hand 6. PREGNANCY: "Is there any chance you are pregnant?" "When was your last menstrual period?"     N/a  Protocols used: Urinary Symptoms-A-AH

## 2023-06-26 ENCOUNTER — Encounter: Payer: Self-pay | Admitting: Physician Assistant

## 2023-06-26 ENCOUNTER — Ambulatory Visit (INDEPENDENT_AMBULATORY_CARE_PROVIDER_SITE_OTHER): Payer: Medicare HMO | Admitting: Physician Assistant

## 2023-06-26 ENCOUNTER — Telehealth: Payer: Self-pay | Admitting: Family Medicine

## 2023-06-26 VITALS — Wt 156.8 lb

## 2023-06-26 DIAGNOSIS — R32 Unspecified urinary incontinence: Secondary | ICD-10-CM | POA: Diagnosis not present

## 2023-06-26 DIAGNOSIS — R339 Retention of urine, unspecified: Secondary | ICD-10-CM | POA: Diagnosis not present

## 2023-06-26 DIAGNOSIS — R21 Rash and other nonspecific skin eruption: Secondary | ICD-10-CM | POA: Diagnosis not present

## 2023-06-26 MED ORDER — TAMSULOSIN HCL 0.4 MG PO CAPS
ORAL_CAPSULE | ORAL | 0 refills | Status: AC
Start: 2023-06-26 — End: ?

## 2023-06-26 MED ORDER — AMLODIPINE BESYLATE 5 MG PO TABS: 5.0000 mg | ORAL_TABLET | Freq: Every morning | ORAL | 0 refills | Status: DC

## 2023-06-26 MED ORDER — APIXABAN 5 MG PO TABS: 5.0000 mg | ORAL_TABLET | Freq: Two times a day (BID) | ORAL | 0 refills | Status: DC

## 2023-06-26 MED ORDER — TRIAMCINOLONE ACETONIDE 0.1 % EX CREA: 1.0000 | TOPICAL_CREAM | Freq: Two times a day (BID) | CUTANEOUS | 0 refills | Status: DC

## 2023-06-26 MED ORDER — DONEPEZIL HCL 5 MG PO TABS: ORAL_TABLET | ORAL | 0 refills | Status: DC

## 2023-06-26 NOTE — Progress Notes (Signed)
Acute Office Visit   Patient: Ronnie Bullock   DOB: June 11, 1946   77 y.o. Male  MRN: [id number] Visit Date: 06/26/2023  Today's healthcare provider: Oswaldo Conroy Alva Kuenzel, PA-C  Introduced myself to the patient as a Secondary school teacher and provided education on APPs in clinical practice.    Chief Complaint  Patient presents with   Urinary Urgency   Urinary Incontinence    Patient says he notices lately, he is using the restroom on himself. Patient says he isn't quite making it to the bathroom to urinate, before he notices it on himself. Patient says he does not wear the Depend Diapers. Patient says it has been going on for a while, and denies seeing a Insurance underwriter.    Subjective    HPI HPI     Urinary Incontinence    Additional comments: Patient says he notices lately, he is using the restroom on himself. Patient says he isn't quite making it to the bathroom to urinate, before he notices it on himself. Patient says he does not wear the Depend Diapers. Patient says it has been going on for a while, and denies seeing a Insurance underwriter.       Last edited by Malen Gauze, CMA on 06/26/2023 11:10 AM.      He is here with his pastor, [place]  Urinary Incontinence   Onset: gradual  Duration: started about 3 weeks ago  Associated symptoms: he states he has been urinating on himself  He denies difficulty urinating, dysuria  His sister Ranelle Oyster)  was called by his pastor during apt to answer her questions His sister states that he has a rash on his arms and chest His sister states that he has reported concerns for having difficulty urinating   His sister would like his pharmacy changed as [facility] is going out of business so they would need to transfer his scripts and would like the meds mailed in blister packs    Pt has a hx of prostate cancer - he is taking flomax at this time Most recent PSA was 0.1      Medications: Outpatient Medications Prior to Visit  Medication Sig    fluticasone (FLONASE) 50 MCG/ACT nasal spray Instill two SPRAYS into each nostril daily   Multiple Vitamin (MULTIVITAMIN WITH MINERALS) TABS tablet Take 1 tablet by mouth daily.   [DISCONTINUED] amLODipine (NORVASC) 5 MG tablet TAKE ONE TABLET BY MOUTH EVERY MORNING   [DISCONTINUED] apixaban (ELIQUIS) 5 MG TABS tablet TAKE ONE TABLET BY MOUTH AT BREAKFAST AND AT BEDTIME   [DISCONTINUED] donepezil (ARICEPT) 5 MG tablet TAKE ONE TABLET BY MOUTH EVERYDAY AT BEDTIME   [DISCONTINUED] tamsulosin (FLOMAX) 0.4 MG CAPS capsule TAKE ONE CAPSULE BY MOUTH EVERYDAY AT BEDTIME   No facility-administered medications prior to visit.    Review of Systems  Constitutional:  Negative for chills and fever.  Genitourinary:  Positive for frequency, genital sores and urgency. Negative for difficulty urinating, dysuria, flank pain, hematuria, penile discharge, penile pain, penile swelling, scrotal swelling and testicular pain.  Skin:  Positive for rash.        Objective    Wt 156 lb 12.8 oz (71.1 kg)   SpO2 94%   BMI 23.16 kg/m     Physical Exam Vitals reviewed. Exam conducted with a chaperone present.  Constitutional:      General: He is awake.     Appearance: Normal appearance. He is well-developed and well-groomed.  HENT:  Head: Normocephalic and atraumatic.  Pulmonary:     Effort: Pulmonary effort is normal.  Genitourinary:    Pubic Area: No rash or pubic lice.      Penis: Normal and uncircumcised. No phimosis or paraphimosis.      Tanner stage (genital): 5.  Musculoskeletal:     Cervical back: Normal range of motion.  Skin:    General: Skin is warm and dry.     Findings: Rash present. Rash is macular, papular and scaling.     Comments: Maculopapular scaling rash present on right forearm and elbow - no open wounds, abrasions, drainage or swelling   Neurological:     Mental Status: He is alert.     GCS: GCS eye subscore is 4. GCS verbal subscore is 5. GCS motor subscore is 6.   Psychiatric:        Attention and Perception: Attention normal.        Mood and Affect: Mood normal.        Speech: Speech is rapid and pressured and slurred.        Behavior: Behavior normal. Behavior is cooperative.       No results found for any visits on 06/26/23.  Assessment & Plan      No follow-ups on file.       Problem List Items Addressed This Visit   None Visit Diagnoses     Urinary incontinence, unspecified type    -  Primary Unsure of chronicity as patient has memory deficit He and caregivers report that he seems to be having difficulty urinating and is also having urinary incontinence symptoms  PE was overall normal today  Will check UA and urine culture for potential UTI  Will also place referral to Urology to assist with symptoms- he has a previous hx of prostate cancer which may be contributory.  Reviewed with caregivers that he should be seen in ED if he is not able to urinate at all as he may need a catheter and further evaluation Follow up after urology apt to coordinate care further based on their recommendations    Relevant Medications   tamsulosin (FLOMAX) 0.4 MG CAPS capsule   Other Relevant Orders   Urinalysis, Routine w reflex microscopic   Urine Culture   Ambulatory referral to Urology   Incomplete bladder emptying       Doing well on his flomax. Refills given. Call with any concerns.    Relevant Medications   tamsulosin (FLOMAX) 0.4 MG CAPS capsule   Rash and nonspecific skin eruption     Acute, new concern Pt reports itching on right forearm and rash  Area appears consistent with atopic dermatitis Will send in kenalog cream to assist with itching- recommend he stops using alcohol on the area as this is likely causing increased dryness Follow up as needed for persistent or progressing symptoms     Relevant Medications   triamcinolone cream (KENALOG) 0.1 %        No follow-ups on file.   I, Aedyn Mckeon E Brier Firebaugh, PA-C, have reviewed all  documentation for this visit. The documentation on 06/26/23 for the exam, diagnosis, procedures, and orders are all accurate and complete.   Jacquelin Hawking, MHS, PA-C [facility]

## 2023-06-26 NOTE — Telephone Encounter (Signed)
Mandy from [facility] stated she received a few prescriptions for pt, and there is a note that medication needs to be pill packed. Angelica Chessman is requesting the most recent med list for pt to have on file.   Please put attention Mandy on fax.  Please advise.

## 2023-06-26 NOTE — Telephone Encounter (Signed)
Medication list printed and faxed over

## 2023-06-27 DIAGNOSIS — R32 Unspecified urinary incontinence: Secondary | ICD-10-CM | POA: Diagnosis not present

## 2023-06-27 LAB — URINALYSIS, ROUTINE W REFLEX MICROSCOPIC
Bilirubin, UA: NEGATIVE
Glucose, UA: NEGATIVE
Ketones, UA: NEGATIVE
Leukocytes,UA: NEGATIVE
Nitrite, UA: NEGATIVE
Specific Gravity, UA: >1.03 — ABNORMAL HIGH (ref 1.005–1.030)
Urobilinogen, Ur: 1 mg/dL (ref 0.2–1.0)
pH, UA: 5.5 (ref 5.0–7.5)

## 2023-06-27 LAB — MICROSCOPIC EXAMINATION
Bacteria, UA: NONE SEEN
Epithelial Cells (non renal): NONE SEEN /hpf (ref 0–10)

## 2023-06-28 ENCOUNTER — Other Ambulatory Visit: Payer: Medicare HMO

## 2023-06-28 ENCOUNTER — Telehealth: Payer: Self-pay

## 2023-06-28 ENCOUNTER — Other Ambulatory Visit: Payer: Self-pay

## 2023-06-28 NOTE — Patient Outreach (Signed)
  Care Management   Follow Up Note   06/28/2023 Name: Ronnie Bullock MRN: [id number] DOB: 02-Mar-1946   Referred by: Dorcas Carrow, DO Reason for referral : Care Management (RNCM: Follow up for Chronic Disease Management and Care Coordination Needs- attempt)   An unsuccessful telephone outreach was attempted today. The patient was referred to the case management team for assistance with care management and care coordination.   Follow Up Plan: A HIPPA compliant phone message was left for the patient providing contact information and requesting a return call.   Alto Denver RN, MSN, CCM RN Care Manager  [facility]  Ambulatory Care Management  Direct Number: [phone]

## 2023-06-28 NOTE — Patient Outreach (Signed)
Care Management   Visit Note  06/28/2023 Name: Ronnie Bullock MRN: [id number] DOB: 05-08-1946  Subjective: Ronnie Bullock is a 77 y.o. year old male who is a primary care patient of Ronnie Carrow, DO. The Care Management team was consulted for assistance.      Engaged with patient spoke with the family member (POA, Ronnie Bullock, [place]).    Goals Addressed             This Visit's Progress    RNCM Care Management Expected Outcome:  Monitor, Self-Manage and Reduce Symptoms of: Dementia       Current Barriers:  Knowledge Deficits related to Decline in mental capacity, increased wandering, family member illness, support and educational needs for family and friends with patient with advanced dementia Care Coordination needs related to resources for help with memory changes and more care in the home in a patient with dementia Chronic Disease Management support and education needs related to effective management of dementia Lacks caregiver support.  Cognitive Deficits  Planned Interventions: Evaluation of current treatment plan related to dementia and memory changes and patient's adherence to plan as established by provider. The patients sister states that the patient is doing better. He has been walking a lot and they did have to do a missing persons report. They found him and now know where he goes a lot. She has also secured a couple of people that are going in the morning to the home and fixing breakfast and helping with household stuff. She is feeling better and doing much better with managing the patients care at a distance. The patients sister knows she has to be diligent in his care and meeting his needs. He is having new incontinence and saw the provider on Monday. The patients sister states that they are working on helping him with this. The Ronnie Bullock is taking him to his appointments and that is helpful.  Advised patient to sister to reach out to the resources given to her by the SW.  The patient has increased his incidence of leaving the home and being gone for long periods of time and nobody knows where he is. Education provided to the patients sister today and review of the available resources. The patients sister given the resources for eldercare. She will call about their diaper program for incontinence supplies and needs.  Provided education to patient re: the concerns of the pcp and staff about the welfare of the patient and the changes noted in recent months. The patient is wandering more and his brother is not able to participate in his care due to his own illness.  Reviewed medications with patient and discussed compliance. There is reason to believe the patient is not taking his medications as directed. Upcoming appointment with the pharm D. The patients sister is working with his medications and has got his medications changed to [employer] for pill packaging system Collaborated with pcp and LCSW regarding the patients missed appointment and wellness check. The writer was able to talk with the patients sister who feels the patient is safe. They have the pastor and others looking for him at his usual hangout spots. He has certain areas he likes to go. Encouraged the patients sister to have a talk with her brother and figure out the best solution to helping the patient. Review of resources and discussed calling the team for additional help Reviewed scheduled/upcoming provider appointments including Saw pcp on 06-26-2023. Knows to call for new needs or concerns Social  Work referral for support and education for dementia support Pharmacy referral for medication management and support. Is using the pill packaging system.  Discussed plans with patient for ongoing care management follow up and provided patient with direct contact information for care management team Advised patient to discuss changes in memory, changes in behaviors, questions and concerns with  provider Screening for signs and symptoms of depression related to chronic disease state  Assessed social determinant of health barriers  Symptom Management: Take medications as prescribed   Attend all scheduled provider appointments Call provider office for new concerns or questions  call the Suicide and Crisis Lifeline: 988 call the [place] National Suicide Prevention Lifeline: (504) 263-0061 or TTY: (332) 099-2163 TTY 203 546 1565) to talk to a trained counselor call 1-800-273-TALK (toll free, 24 hour hotline) if experiencing a Mental Health or Behavioral Health Crisis   Follow Up Plan: Telephone follow up appointment with care management team member scheduled for: 09-05-2023 at 1030 am       RNCM Care Management Expected Outcome:  Monitor, Self-Manage, and Reduce Symptoms of Hypertension       Current Barriers:  Knowledge Deficits related to keeping appointments with the pcp, following the plan of care, and taking medications as prescribed  Care Coordination needs related to patients wandering and leaving the home and sometimes not returning to late, brother who he resides with is ill, sister lives in [place] who is POA in a patient with HTN and other chronic conditions Chronic Disease Management support and education needs related to Effective management of HTN Lacks caregiver support.  Cognitive Deficits BP Readings from Last 3 Encounters:  05/02/23 (!) 162/88  08/04/22 138/80  01/24/22 136/88     Planned Interventions: Evaluation of current treatment plan related to hypertension self management and patient's adherence to plan as established by provider. The patient with elevated blood pressures at times. Was seen on Monday in the office for a rash and urinary incontinence. The patients sister is working with her other brother and the pastor and friends to help support the patient and his needs due to memory changes. Reflective listening and support given.   Provided education to patient  re: stroke prevention, s/s of heart attack and stroke; Reviewed prescribed diet heart healthy diet  Reviewed medications with patient and discussed importance of compliance;  Discussed plans with patient for ongoing care management follow up and provided patient with direct contact information for care management team; Advised patient, providing education and rationale, to monitor blood pressure daily and record, calling PCP for findings outside established parameters;  Reviewed scheduled/upcoming provider appointments including: Saw the pcp on Monday, knows to call for changes Advised patient to discuss changes in HTN or heart health with provider; Provided education on prescribed diet heart healthy;  Discussed complications of poorly controlled blood pressure such as heart disease, stroke, circulatory complications, vision complications, kidney impairment, sexual dysfunction;  Screening for signs and symptoms of depression related to chronic disease state;  Assessed social determinant of health barriers;   Symptom Management: Take medications as prescribed   Attend all scheduled provider appointments Call provider office for new concerns or questions  call the Suicide and Crisis Lifeline: 988 call the [place] National Suicide Prevention Lifeline: 743-666-1369 or TTY: 832-547-8696 TTY 878-712-0424) to talk to a trained counselor call 1-800-273-TALK (toll free, 24 hour hotline) if experiencing a Mental Health or Behavioral Health Crisis  check blood pressure weekly learn about high blood pressure call doctor for signs and symptoms of high blood pressure  keep all doctor appointments take medications for blood pressure exactly as prescribed report new symptoms to your doctor  Follow Up Plan: Telephone follow up appointment with care management team member scheduled for: 09-05-2023 at 1030 am           Consent to Services:  Patient was given information about care management services,  agreed to services, and gave verbal consent to participate.   Plan: Telephone follow up appointment with care management team member scheduled for: 09-05-2023 at 1030 am  Alto Denver RN, MSN, CCM RN Care Manager  [facility]  Ambulatory Care Management  Direct Number: [phone]

## 2023-06-28 NOTE — Patient Instructions (Signed)
Visit Information  Thank you for taking time to visit with me today. Please don't hesitate to contact me if I can be of assistance to you before our next scheduled telephone appointment.  Following are the goals we discussed today:   Goals Addressed             This Visit's Progress    RNCM Care Management Expected Outcome:  Monitor, Self-Manage and Reduce Symptoms of: Dementia       Current Barriers:  Knowledge Deficits related to Decline in mental capacity, increased wandering, family member illness, support and educational needs for family and friends with patient with advanced dementia Care Coordination needs related to resources for help with memory changes and more care in the home in a patient with dementia Chronic Disease Management support and education needs related to effective management of dementia Lacks caregiver support.  Cognitive Deficits  Planned Interventions: Evaluation of current treatment plan related to dementia and memory changes and patient's adherence to plan as established by provider. The patients sister states that the patient is doing better. He has been walking a lot and they did have to do a missing persons report. They found him and now know where he goes a lot. She has also secured a couple of people that are going in the morning to the home and fixing breakfast and helping with household stuff. She is feeling better and doing much better with managing the patients care at a distance. The patients sister knows she has to be diligent in his care and meeting his needs. He is having new incontinence and saw the provider on Monday. The patients sister states that they are working on helping him with this. The Renato Gails is taking him to his appointments and that is helpful.  Advised patient to sister to reach out to the resources given to her by the SW. The patient has increased his incidence of leaving the home and being gone for long periods of time and nobody knows  where he is. Education provided to the patients sister today and review of the available resources. The patients sister given the resources for eldercare. She will call about their diaper program for incontinence supplies and needs.  Provided education to patient re: the concerns of the pcp and staff about the welfare of the patient and the changes noted in recent months. The patient is wandering more and his brother is not able to participate in his care due to his own illness.  Reviewed medications with patient and discussed compliance. There is reason to believe the patient is not taking his medications as directed. Upcoming appointment with the pharm D. The patients sister is working with his medications and has got his medications changed to [employer] for pill packaging system Collaborated with pcp and LCSW regarding the patients missed appointment and wellness check. The writer was able to talk with the patients sister who feels the patient is safe. They have the pastor and others looking for him at his usual hangout spots. He has certain areas he likes to go. Encouraged the patients sister to have a talk with her brother and figure out the best solution to helping the patient. Review of resources and discussed calling the team for additional help Reviewed scheduled/upcoming provider appointments including Saw pcp on 06-26-2023. Knows to call for new needs or concerns Social Work referral for support and education for dementia support Pharmacy referral for medication management and support. Is using the pill packaging system.  Discussed plans with patient for ongoing care management follow up and provided patient with direct contact information for care management team Advised patient to discuss changes in memory, changes in behaviors, questions and concerns with provider Screening for signs and symptoms of depression related to chronic disease state  Assessed social determinant of health  barriers  Symptom Management: Take medications as prescribed   Attend all scheduled provider appointments Call provider office for new concerns or questions  call the Suicide and Crisis Lifeline: 988 call the [place] National Suicide Prevention Lifeline: 531-782-7965 or TTY: 506-618-3172 TTY 480 151 7513) to talk to a trained counselor call 1-800-273-TALK (toll free, 24 hour hotline) if experiencing a Mental Health or Behavioral Health Crisis   Follow Up Plan: Telephone follow up appointment with care management team member scheduled for: 09-05-2023 at 1030 am       RNCM Care Management Expected Outcome:  Monitor, Self-Manage, and Reduce Symptoms of Hypertension       Current Barriers:  Knowledge Deficits related to keeping appointments with the pcp, following the plan of care, and taking medications as prescribed  Care Coordination needs related to patients wandering and leaving the home and sometimes not returning to late, brother who he resides with is ill, sister lives in [place] who is POA in a patient with HTN and other chronic conditions Chronic Disease Management support and education needs related to Effective management of HTN Lacks caregiver support.  Cognitive Deficits BP Readings from Last 3 Encounters:  05/02/23 (!) 162/88  08/04/22 138/80  01/24/22 136/88     Planned Interventions: Evaluation of current treatment plan related to hypertension self management and patient's adherence to plan as established by provider. The patient with elevated blood pressures at times. Was seen on Monday in the office for a rash and urinary incontinence. The patients sister is working with her other brother and the pastor and friends to help support the patient and his needs due to memory changes. Reflective listening and support given.   Provided education to patient re: stroke prevention, s/s of heart attack and stroke; Reviewed prescribed diet heart healthy diet  Reviewed medications with  patient and discussed importance of compliance;  Discussed plans with patient for ongoing care management follow up and provided patient with direct contact information for care management team; Advised patient, providing education and rationale, to monitor blood pressure daily and record, calling PCP for findings outside established parameters;  Reviewed scheduled/upcoming provider appointments including: Saw the pcp on Monday, knows to call for changes Advised patient to discuss changes in HTN or heart health with provider; Provided education on prescribed diet heart healthy;  Discussed complications of poorly controlled blood pressure such as heart disease, stroke, circulatory complications, vision complications, kidney impairment, sexual dysfunction;  Screening for signs and symptoms of depression related to chronic disease state;  Assessed social determinant of health barriers;   Symptom Management: Take medications as prescribed   Attend all scheduled provider appointments Call provider office for new concerns or questions  call the Suicide and Crisis Lifeline: 988 call the [place] National Suicide Prevention Lifeline: 413-730-3206 or TTY: (726)160-1460 TTY 272-062-8886) to talk to a trained counselor call 1-800-273-TALK (toll free, 24 hour hotline) if experiencing a Mental Health or Behavioral Health Crisis  check blood pressure weekly learn about high blood pressure call doctor for signs and symptoms of high blood pressure keep all doctor appointments take medications for blood pressure exactly as prescribed report new symptoms to your doctor  Follow Up Plan: Telephone  follow up appointment with care management team member scheduled for: 09-05-2023 at 1030 am           Our next appointment is by telephone on 09-05-2023 at 1030 am  Please call the care guide team at [phone] if you need to cancel or reschedule your appointment.   If you are experiencing a Mental Health or  Behavioral Health Crisis or need someone to talk to, please call the Suicide and Crisis Lifeline: 988 call the [place] National Suicide Prevention Lifeline: (628) 847-5015 or TTY: 669-554-8359 TTY (863)007-8118) to talk to a trained counselor call 1-800-273-TALK (toll free, 24 hour hotline)   Patient verbalizes understanding of instructions and care plan provided today and agrees to view in MyChart. Active MyChart status and patient understanding of how to access instructions and care plan via MyChart confirmed with patient.       Alto Denver RN, MSN, CCM RN Care Manager  [facility]  Ambulatory Care Management  Direct Number: [phone]

## 2023-06-28 NOTE — Progress Notes (Signed)
Your urine did not seem to show signs of a kidney infection or UTI at this time  There was some trace blood and crystals in your urine which may indicate that you are passing kidney stones We can send you for a CT scan to check this further if you wish. Please let us know if this is something you would like or if you want to wait to speak to Urology.

## 2023-06-29 LAB — URINE CULTURE

## 2023-06-29 NOTE — Progress Notes (Signed)
Your urine culture was negative for signs of bacterial infection  I have placed the order for the CT scan. You should receive a phone call soon to help set this up

## 2023-06-29 NOTE — Addendum Note (Signed)
Addended by: Jacquelin Hawking on: 06/29/2023 12:49 PM   Modules accepted: Orders

## 2023-06-30 ENCOUNTER — Other Ambulatory Visit: Payer: Self-pay

## 2023-06-30 NOTE — Patient Instructions (Signed)
Visit Information  Thank you for taking time to visit with me today. Please don't hesitate to contact me if I can be of assistance to you before our next scheduled telephone appointment.  Following are the goals we discussed today:   Goals Addressed             This Visit's Progress    RNCM Care Management Expected Outcome:  Monitor, Self-Manage and Reduce Symptoms of: Dementia       Current Barriers:  Knowledge Deficits related to Decline in mental capacity, increased wandering, family member illness, support and educational needs for family and friends with patient with advanced dementia Care Coordination needs related to resources for help with memory changes and more care in the home in a patient with dementia Chronic Disease Management support and education needs related to effective management of dementia Lacks caregiver support.  Cognitive Deficits  Planned Interventions: Evaluation of current treatment plan related to dementia and memory changes and patient's adherence to plan as established by provider. The patients sister states that the patient is doing better. He has been walking a lot and they did have to do a missing persons report. They found him and now know where he goes a lot. She has also secured a couple of people that are going in the morning to the home and fixing breakfast and helping with household stuff. She is feeling better and doing much better with managing the patients care at a distance. The patients sister knows she has to be diligent in his care and meeting his needs. He is having new incontinence and saw the provider on Monday. The patients sister states that they are working on helping him with this. The Renato Gails is taking him to his appointments and that is helpful.  Advised patient to sister to reach out to the resources given to her by the SW. The patient has increased his incidence of leaving the home and being gone for long periods of time and nobody knows  where he is. Education provided to the patients sister today and review of the available resources. The patients sister given the resources for eldercare. She will call about their diaper program for incontinence supplies and needs.  Provided education to patient re: the concerns of the pcp and staff about the welfare of the patient and the changes noted in recent months. The patient is wandering more and his brother is not able to participate in his care due to his own illness.  Reviewed medications with patient and discussed compliance. There is reason to believe the patient is not taking his medications as directed. Upcoming appointment with the pharm D. The patients sister is working with his medications and has got his medications changed to [employer] for pill packaging system Collaborated with pcp and LCSW regarding the patients missed appointment and wellness check. The writer was able to talk with the patients sister who feels the patient is safe. They have the pastor and others looking for him at his usual hangout spots. He has certain areas he likes to go. Encouraged the patients sister to have a talk with her brother and figure out the best solution to helping the patient. Review of resources and discussed calling the team for additional help Reviewed scheduled/upcoming provider appointments including Saw pcp on 06-26-2023. Knows to call for new needs or concerns Social Work referral for support and education for dementia support Pharmacy referral for medication management and support. Is using the pill packaging system.  Discussed plans with patient for ongoing care management follow up and provided patient with direct contact information for care management team Advised patient to discuss changes in memory, changes in behaviors, questions and concerns with provider Screening for signs and symptoms of depression related to chronic disease state  Assessed social determinant of health  barriers Incoming call from the patients sister. She was asking for recommendations on what to do about his CT scan next week. She had to cancel it for Monday because the pastor could not take him and she scheduled it for Tuesday. Then now she cannot take him on Tuesday. Advised the patients sister to call and schedule when she felt he could go and go from there. Did advise if  he was starting to have sever pain or changes that they should have him evaluated at the ER. She felt that she was not being a good sister and caretaker because she could not get him to his appointments. Advised the patients sister this was not the case and she was doing what she could and this was out of her control. Education and support given. Will let the pcp know about delays due to scheduling conflicts. Will continue to follow.  Symptom Management: Take medications as prescribed   Attend all scheduled provider appointments Call provider office for new concerns or questions  call the Suicide and Crisis Lifeline: 988 call the [place] National Suicide Prevention Lifeline: (715) 075-2818 or TTY: (519)764-7264 TTY (279)217-5466) to talk to a trained counselor call 1-800-273-TALK (toll free, 24 hour hotline) if experiencing a Mental Health or Behavioral Health Crisis   Follow Up Plan: Telephone follow up appointment with care management team member scheduled for: 09-05-2023 at 1030 am           Our next appointment is by telephone on 09-05-2023  at 1030 am  Please call the care guide team at [phone] if you need to cancel or reschedule your appointment.   If you are experiencing a Mental Health or Behavioral Health Crisis or need someone to talk to, please call the Suicide and Crisis Lifeline: 988 call the [place] National Suicide Prevention Lifeline: 548-415-9235 or TTY: 704-605-9601 TTY 606-171-9208) to talk to a trained counselor call 1-800-273-TALK (toll free, 24 hour hotline)   Patient verbalizes understanding  of instructions and care plan provided today and agrees to view in MyChart. Active MyChart status and patient understanding of how to access instructions and care plan via MyChart confirmed with patient.       Alto Denver RN, MSN, CCM RN Care Manager  [facility]  Ambulatory Care Management  Direct Number: [phone]

## 2023-06-30 NOTE — Patient Outreach (Signed)
Care Management   Visit Note  06/30/2023 Name: Ronnie Bullock MRN: [id number] DOB: January 22, 1946  Subjective: Ronnie Bullock is a 77 y.o. year old male who is a primary care patient of Dorcas Carrow, DO. The Care Management team was consulted for assistance.      Engaged with patient spoke with the family member (POA, Ronnie Bullock, [place]).    Goals Addressed             This Visit's Progress    RNCM Care Management Expected Outcome:  Monitor, Self-Manage and Reduce Symptoms of: Dementia       Current Barriers:  Knowledge Deficits related to Decline in mental capacity, increased wandering, family member illness, support and educational needs for family and friends with patient with advanced dementia Care Coordination needs related to resources for help with memory changes and more care in the home in a patient with dementia Chronic Disease Management support and education needs related to effective management of dementia Lacks caregiver support.  Cognitive Deficits  Planned Interventions: Evaluation of current treatment plan related to dementia and memory changes and patient's adherence to plan as established by provider. The patients sister states that the patient is doing better. He has been walking a lot and they did have to do a missing persons report. They found him and now know where he goes a lot. She has also secured a couple of people that are going in the morning to the home and fixing breakfast and helping with household stuff. She is feeling better and doing much better with managing the patients care at a distance. The patients sister knows she has to be diligent in his care and meeting his needs. He is having new incontinence and saw the provider on Monday. The patients sister states that they are working on helping him with this. The Renato Gails is taking him to his appointments and that is helpful.  Advised patient to sister to reach out to the resources given to her by the SW.  The patient has increased his incidence of leaving the home and being gone for long periods of time and nobody knows where he is. Education provided to the patients sister today and review of the available resources. The patients sister given the resources for eldercare. She will call about their diaper program for incontinence supplies and needs.  Provided education to patient re: the concerns of the pcp and staff about the welfare of the patient and the changes noted in recent months. The patient is wandering more and his brother is not able to participate in his care due to his own illness.  Reviewed medications with patient and discussed compliance. There is reason to believe the patient is not taking his medications as directed. Upcoming appointment with the pharm D. The patients sister is working with his medications and has got his medications changed to [employer] for pill packaging system Collaborated with pcp and LCSW regarding the patients missed appointment and wellness check. The writer was able to talk with the patients sister who feels the patient is safe. They have the pastor and others looking for him at his usual hangout spots. He has certain areas he likes to go. Encouraged the patients sister to have a talk with her brother and figure out the best solution to helping the patient. Review of resources and discussed calling the team for additional help Reviewed scheduled/upcoming provider appointments including Saw pcp on 06-26-2023. Knows to call for new needs or concerns Social  Work referral for support and education for dementia support Pharmacy referral for medication management and support. Is using the pill packaging system.  Discussed plans with patient for ongoing care management follow up and provided patient with direct contact information for care management team Advised patient to discuss changes in memory, changes in behaviors, questions and concerns with  provider Screening for signs and symptoms of depression related to chronic disease state  Assessed social determinant of health barriers Incoming call from the patients sister. She was asking for recommendations on what to do about his CT scan next week. She had to cancel it for Monday because the pastor could not take him and she scheduled it for Tuesday. Then now she cannot take him on Tuesday. Advised the patients sister to call and schedule when she felt he could go and go from there. Did advise if  he was starting to have sever pain or changes that they should have him evaluated at the ER. She felt that she was not being a good sister and caretaker because she could not get him to his appointments. Advised the patients sister this was not the case and she was doing what she could and this was out of her control. Education and support given. Will let the pcp know about delays due to scheduling conflicts. Will continue to follow.  Symptom Management: Take medications as prescribed   Attend all scheduled provider appointments Call provider office for new concerns or questions  call the Suicide and Crisis Lifeline: 988 call the [place] National Suicide Prevention Lifeline: (385)417-6493 or TTY: (304)585-0078 TTY 901-185-6182) to talk to a trained counselor call 1-800-273-TALK (toll free, 24 hour hotline) if experiencing a Mental Health or Behavioral Health Crisis   Follow Up Plan: Telephone follow up appointment with care management team member scheduled for: 09-05-2023 at 1030 am           Consent to Services:  Patient was given information about care management services, agreed to services, and gave verbal consent to participate.   Plan: Telephone follow up appointment with care management team member scheduled for: 09-05-2023 at 1030 am  Alto Denver RN, MSN, CCM RN Care Manager  [facility]  Ambulatory Care Management  Direct Number: [phone]

## 2023-07-03 ENCOUNTER — Ambulatory Visit: Payer: Medicare HMO | Admitting: Family Medicine

## 2023-07-03 ENCOUNTER — Ambulatory Visit: Payer: Medicare HMO

## 2023-07-04 ENCOUNTER — Ambulatory Visit: Payer: Medicare HMO

## 2023-07-06 ENCOUNTER — Other Ambulatory Visit: Payer: Self-pay | Admitting: Family Medicine

## 2023-07-06 DIAGNOSIS — R21 Rash and other nonspecific skin eruption: Secondary | ICD-10-CM

## 2023-07-06 NOTE — Telephone Encounter (Signed)
Medication Refill - Medication: triamcinolone cream (KENALOG) 0.1 % /amLODipine (NORVASC) 5 MG tablet/donepezil (ARICEPT) 5 MG tabletMultiple Vitamin (MULTIVITAMIN WITH MINERALS) TABS tablet  Has the patient contacted their pharmacy? yes (Agent: If yes, when and what did the pharmacy advise?)pharmacy called dirctly in  Preferred Pharmacy (with phone number or street name): [facility] [place], [place] - [address]  Phone: [phone] Fax: [phone]  Has the patient been seen for an appointment in the last year OR does the patient have an upcoming appointment? yes  Agent: Please be advised that RX refills may take up to 3 business days. We ask that you follow-up with your pharmacy.

## 2023-07-07 MED ORDER — ADULT MULTIVITAMIN W/MINERALS CH: 1.0000 | ORAL_TABLET | Freq: Every day | ORAL | 0 refills | Status: AC

## 2023-07-07 MED ORDER — TRIAMCINOLONE ACETONIDE 0.1 % EX CREA: 1.0000 | TOPICAL_CREAM | Freq: Two times a day (BID) | CUTANEOUS | 0 refills | Status: AC

## 2023-07-07 NOTE — Telephone Encounter (Signed)
Requested Prescriptions  Pending Prescriptions Disp Refills   triamcinolone cream (KENALOG) 0.1 % 30 g 0    Sig: Apply 1 Application topically 2 (two) times daily.     Not Delegated - Dermatology:  Corticosteroids Failed - 07/06/2023  4:19 PM      Failed - This refill cannot be delegated      Passed - Valid encounter within last 12 months    Recent Outpatient Visits           1 week ago Urinary incontinence, unspecified type   [place] [facility] Mecum, Erin E, PA-C   2 months ago Coronary artery disease of native artery of native heart with stable angina pectoris [facility])   [place] [facility] [place], Megan P, DO   11 months ago Routine general medical examination at a health care facility   [facility] [place], [place] P, DO   1 year ago Dementia without behavioral disturbance [facility])   [place] [facility] [place], Megan P, DO   1 year ago Essential hypertension, benign   [place] [facility] Dorcas Carrow, DO       Future Appointments             In 1 month MacDiarmid, Lorin Picket, MD [facility] [place]             Multiple Vitamin (MULTIVITAMIN WITH MINERALS) TABS tablet      Sig: Take 1 tablet by mouth daily.     There is no refill protocol information for this order    Refused Prescriptions Disp Refills   donepezil (ARICEPT) 5 MG tablet 90 tablet 0    Sig: TAKE ONE TABLET BY MOUTH EVERYDAY AT BEDTIME     Neurology:  Alzheimer's Agents Passed - 07/06/2023  4:19 PM      Passed - Valid encounter within last 6 months    Recent Outpatient Visits           1 week ago Urinary incontinence, unspecified type   [place] [facility] Mecum, Erin E, PA-C   2 months ago Coronary artery disease of native artery of native heart with stable angina pectoris [facility])   [place] [facility] [place], Megan P, DO   11 months ago Routine general medical  examination at a health care facility   [facility] [place], [place] P, DO   1 year ago Dementia without behavioral disturbance [facility])   [place] [facility] [place], Megan P, DO   1 year ago Essential hypertension, benign   [place] [facility] [place], Megan P, DO       Future Appointments             In 1 month MacDiarmid, Scott, MD [facility] [place]             amLODipine (NORVASC) 5 MG tablet 90 tablet 0    Sig: Take 1 tablet (5 mg total) by mouth every morning.     Cardiovascular: Calcium Channel Blockers 2 Failed - 07/06/2023  4:19 PM      Failed - Last BP in normal range    BP Readings from Last 1 Encounters:  05/02/23 (!) 162/88         Passed - Last Heart Rate in normal range    Pulse Readings from Last 1 Encounters:  05/02/23 72         Passed -  Valid encounter within last 6 months    Recent Outpatient Visits           1 week ago Urinary incontinence, unspecified type   [place] [facility] Mecum, Oswaldo Conroy, PA-C   2 months ago Coronary artery disease of native artery of native heart with stable angina pectoris [facility])   [place] [facility] [place], Megan P, DO   11 months ago Routine general medical examination at a health care facility   [facility] [place], [place] P, DO   1 year ago Dementia without behavioral disturbance [facility])   [place] [facility] [place], Megan P, DO   1 year ago Essential hypertension, benign    [facility] Dorcas Carrow, DO       Future Appointments             In 1 month MacDiarmid, Lorin Picket, MD [facility]

## 2023-07-07 NOTE — Telephone Encounter (Signed)
Requested medications are due for refill today.  Unsure  Requested medications are on the active medications list.  yes  Last refill. Kenalog 06/26/2023, Vitamins 01/2021  Future visit scheduled.   no  Notes to clinic.  Unable to refill or refuse Kenalog. Vitamin rx signed by Calvert Cantor.    Requested Prescriptions  Pending Prescriptions Disp Refills   triamcinolone cream (KENALOG) 0.1 % 30 g 0    Sig: Apply 1 Application topically 2 (two) times daily.     Not Delegated - Dermatology:  Corticosteroids Failed - 07/06/2023  4:19 PM      Failed - This refill cannot be delegated      Passed - Valid encounter within last 12 months    Recent Outpatient Visits           1 week ago Urinary incontinence, unspecified type   [place] [facility] Mecum, Erin E, PA-C   2 months ago Coronary artery disease of native artery of native heart with stable angina pectoris [facility])   [place] [facility] [place], Megan P, DO   11 months ago Routine general medical examination at a health care facility   [facility] [place], [place] P, DO   1 year ago Dementia without behavioral disturbance [facility])   [place] [facility] [place], Megan P, DO   1 year ago Essential hypertension, benign   [place] [facility] Dorcas Carrow, DO       Future Appointments             In 1 month MacDiarmid, Lorin Picket, MD [facility] [place]             Multiple Vitamin (MULTIVITAMIN WITH MINERALS) TABS tablet      Sig: Take 1 tablet by mouth daily.     There is no refill protocol information for this order    Refused Prescriptions Disp Refills   donepezil (ARICEPT) 5 MG tablet 90 tablet 0    Sig: TAKE ONE TABLET BY MOUTH EVERYDAY AT BEDTIME     Neurology:  Alzheimer's Agents Passed - 07/06/2023  4:19 PM      Passed - Valid encounter within last 6 months    Recent Outpatient Visits           1 week ago Urinary  incontinence, unspecified type   [place] [facility] Mecum, Erin E, PA-C   2 months ago Coronary artery disease of native artery of native heart with stable angina pectoris [facility])   [place] [facility] [place], Megan P, DO   11 months ago Routine general medical examination at a health care facility   [facility] [place], [place] P, DO   1 year ago Dementia without behavioral disturbance [facility])   [place] [facility] [place], Megan P, DO   1 year ago Essential hypertension, benign   [place] [facility] [place], Megan P, DO       Future Appointments             In 1 month MacDiarmid, Scott, MD [facility] [place]             amLODipine (NORVASC) 5 MG tablet 90 tablet 0    Sig: Take 1 tablet (5 mg total) by mouth every morning.     Cardiovascular: Calcium Channel Blockers 2 Failed - 07/06/2023  4:19 PM      Failed - Last  BP in normal range    BP Readings from Last 1 Encounters:  05/02/23 (!) 162/88         Passed - Last Heart Rate in normal range    Pulse Readings from Last 1 Encounters:  05/02/23 72         Passed - Valid encounter within last 6 months    Recent Outpatient Visits           1 week ago Urinary incontinence, unspecified type   [place] [facility] Mecum, Oswaldo Conroy, PA-C   2 months ago Coronary artery disease of native artery of native heart with stable angina pectoris [facility])   [place] [facility] [place], Megan P, DO   11 months ago Routine general medical examination at a health care facility   [facility] [place], [place] P, DO   1 year ago Dementia without behavioral disturbance [facility] [place])   [place] [facility] [place], Megan P, DO   1 year ago Essential hypertension, benign   [place] [facility] [place], Oralia Rud, DO       Future Appointments             In 1 month  MacDiarmid, Lorin Picket, MD [facility]

## 2023-07-31 ENCOUNTER — Telehealth: Payer: Self-pay | Admitting: *Deleted

## 2023-07-31 NOTE — Patient Outreach (Addendum)
Care Coordination   Follow Up Visit Note   07/31/2023 Name: Ronnie Bullock MRN: [id number] DOB: 04-27-1946  Ronnie Bullock is a 77 y.o. year old male who sees Dorcas Carrow, DO for primary care. I spoke with  Ronnie Bullock by phone today.  What matters to the patients health and wellness today?  Patient's sister concerned regarding patient's behavior. Patient's sister requesting resources to increase patient supervision. Sister willing to consider a Adult Day Care program as well as eventual plan for long term care in a memory care facility.    Goals Addressed             This Visit's Progress    In home care resources       Interventions Today    Flowsheet Row Most Recent Value  Chronic Disease   Chronic disease during today's visit Other  [dementia]  General Interventions   General Interventions Discussed/Reviewed General Interventions Discussed, Level of Care, Communication with  [Patient's sister expressed concern re: supervison for patient-increased incontinentence and large withdrawls for bank noted-patient's brother ability to provide care in the home limited due to his own medical challenges]  Communication with RN  [facility] contacted to discuss patient's sister concerns and resources offered]  Level of Care Adult Daycare, Skilled Nursing Facility  [Adult Day Care program discusssed as an option for structure and supervision as well as placement options in a memory care facility]  Education Interventions   Education Provided Provided Education  Provided Verbal Education On [employer], Air traffic controller  [Provided contact number for [facility]-discussed placement process and need for sister /family member to consider applying for guardianship for decision making abilities related to patient's care]  Mental Health Interventions   Mental Health Discussed/Reviewed Other  Teacher, English as a foreign language stress acknowledged, emotional support provided]  Safety Interventions    Safety Discussed/Reviewed Safety Discussed  Advanced Directive Interventions   Advanced Directives Discussed/Reviewed Guardianship  Guardianship --  [court process reviewed]              SDOH assessments and interventions completed:  No     Care Coordination Interventions:   {THN Tip this will not be part of the note when signed-REQUIRED REPORYes, providedT FIELD DO NOT DELETE (Optional):27901}  Follow up plan: Follow up call scheduled for 08/14/23    Encounter Outcome:  Patient Visit Completed

## 2023-07-31 NOTE — Patient Instructions (Addendum)
Visit Information  Thank you for taking time to visit with me today. Please don't hesitate to contact me if I can be of assistance to you.   Following are the goals we discussed today:  Please contact [facility] to discuss possible options for care Please consider long term care options as well as guardianship proceedings   Our next appointment is by telephone on 08/14/23 at 11am  Please call the care guide team at [phone] if you need to cancel or reschedule your appointment.   If you are experiencing a Mental Health or Behavioral Health Crisis or need someone to talk to, please call 911   Patient verbalizes understanding of instructions and care plan provided today and agrees to view in MyChart. Active MyChart status and patient understanding of how to access instructions and care plan via MyChart confirmed with patient.     Telephone follow up appointment with care management team member scheduled for: 08/14/23 Verna Czech, LCSW [place]  [facility], [facility] Licensed Clinical Social Worker Care Coordinator  Direct Dial: [phone]

## 2023-08-07 ENCOUNTER — Other Ambulatory Visit: Payer: Medicare HMO

## 2023-08-07 ENCOUNTER — Telehealth: Payer: Self-pay | Admitting: Family Medicine

## 2023-08-07 DIAGNOSIS — Z111 Encounter for screening for respiratory tuberculosis: Secondary | ICD-10-CM | POA: Diagnosis not present

## 2023-08-07 NOTE — Telephone Encounter (Signed)
Appt has been made.

## 2023-08-07 NOTE — Telephone Encounter (Signed)
Yes please. Appt.

## 2023-08-07 NOTE — Telephone Encounter (Unsigned)
Copied from CRM [id number]. Topic: General - Other >> Aug 07, 2023 12:30 PM Phill Myron wrote: Ms Ronnie Bullock  is requesting the [name]  get a  TB shot and Adult DayCare Forms to be completed. PLease advise

## 2023-08-09 ENCOUNTER — Telehealth: Payer: Self-pay | Admitting: *Deleted

## 2023-08-09 ENCOUNTER — Encounter: Payer: Self-pay | Admitting: Family Medicine

## 2023-08-09 ENCOUNTER — Ambulatory Visit (INDEPENDENT_AMBULATORY_CARE_PROVIDER_SITE_OTHER): Payer: Medicare HMO | Admitting: Family Medicine

## 2023-08-09 VITALS — BP 121/77 | HR 80 | Wt 153.0 lb

## 2023-08-09 DIAGNOSIS — R339 Retention of urine, unspecified: Secondary | ICD-10-CM

## 2023-08-09 DIAGNOSIS — Z0279 Encounter for issue of other medical certificate: Secondary | ICD-10-CM | POA: Diagnosis not present

## 2023-08-09 DIAGNOSIS — F0390 Unspecified dementia without behavioral disturbance: Secondary | ICD-10-CM | POA: Diagnosis not present

## 2023-08-09 DIAGNOSIS — I10 Essential (primary) hypertension: Secondary | ICD-10-CM | POA: Diagnosis not present

## 2023-08-09 DIAGNOSIS — Z23 Encounter for immunization: Secondary | ICD-10-CM | POA: Diagnosis not present

## 2023-08-09 DIAGNOSIS — E7800 Pure hypercholesterolemia, unspecified: Secondary | ICD-10-CM | POA: Diagnosis not present

## 2023-08-09 DIAGNOSIS — Z0000 Encounter for general adult medical examination without abnormal findings: Secondary | ICD-10-CM | POA: Diagnosis not present

## 2023-08-09 DIAGNOSIS — Z111 Encounter for screening for respiratory tuberculosis: Secondary | ICD-10-CM | POA: Diagnosis not present

## 2023-08-09 MED ORDER — DONEPEZIL HCL 5 MG PO TABS: ORAL_TABLET | ORAL | 0 refills | Status: DC

## 2023-08-09 MED ORDER — TAMSULOSIN HCL 0.4 MG PO CAPS
0.8000 mg | ORAL_CAPSULE | Freq: Every day | ORAL | 0 refills | Status: DC
Start: 2023-08-09 — End: 2023-11-06

## 2023-08-09 MED ORDER — APIXABAN 5 MG PO TABS: 5.0000 mg | ORAL_TABLET | Freq: Two times a day (BID) | ORAL | 0 refills | Status: DC

## 2023-08-09 MED ORDER — AMLODIPINE BESYLATE 5 MG PO TABS: 5.0000 mg | ORAL_TABLET | Freq: Every morning | ORAL | 0 refills | Status: DC

## 2023-08-09 NOTE — Assessment & Plan Note (Signed)
Under good control on current regimen. Continue current regimen. Continue to monitor. Call with any concerns. Refills given.   

## 2023-08-09 NOTE — Telephone Encounter (Signed)
-----   Message from Olevia Perches sent at 08/09/2023 11:23 AM EDT ----- Cn we call Ronnie Bullock at [facility] and get her to change his flomax to 2 a day in the pill pack

## 2023-08-09 NOTE — Patient Instructions (Signed)
Visit Information  Thank you for taking time to visit with me today. Please don't hesitate to contact me if I can be of assistance to you.   Following are the goals we discussed today:   Goals Addressed             This Visit's Progress    Adult Day Care Options       Activities and task to complete in order to accomplish goals.   Self Support options  (patient to start the [facility] on 08/14/23 ) Patient's sister to contact the Social Security Administration to apply to become patient's financial payee         Our next appointment is by telephone on 08/14/23 at 11am  Please call the care guide team at [phone] if you need to cancel or reschedule your appointment.   If you are experiencing a Mental Health or Behavioral Health Crisis or need someone to talk to, please call the Suicide and Crisis Lifeline: 988   Patient verbalizes understanding of instructions and care plan provided today and agrees to view in MyChart. Active MyChart status and patient understanding of how to access instructions and care plan via MyChart confirmed with patient.     Telephone follow up appointment with care management team member scheduled for: 08/14/23   Verna Czech, LCSW [place]  [facility], [facility] Licensed Clinical Social Worker Care Coordinator  Direct Dial: [phone]

## 2023-08-09 NOTE — Patient Outreach (Signed)
Care Coordination   Follow Up Visit Note   08/09/2023 Name: Ronnie Bullock MRN: [id number] DOB: 28-Jun-1946  Ronnie Bullock is a 77 y.o. year old male who sees Dorcas Carrow, DO for primary care. I spoke with  Ronnie Bullock's sister by phone today.  What matters to the patients health and wellness today?  Community resource needs to address need for structure and supervision     Goals Addressed             This Visit's Progress    Adult Day Care Options       Activities and task to complete in order to accomplish goals.   Self Support options  (patient to start the [facility] on 08/14/23 ) Patient's sister to contact the Social Security Administration to apply to become patient's financial payee         SDOH assessments and interventions completed:  No     Care Coordination Interventions:  Yes, provided  Interventions Today    Flowsheet Row Most Recent Value  Chronic Disease   Chronic disease during today's visit Other  [dementia]  General Interventions   General Interventions Discussed/Reviewed General Interventions Reviewed, [facility], Level of Care  Level of Care Adult Daycare  [confirmed that patient will be starting the [facility] on 08/14/23 TB test completed -Zenaida Niece will provide transport in two weeks, until then church fanily will assist with the daily transport]  Safety Interventions   Safety Discussed/Reviewed Safety Reviewed  [discussed concern that pt is unable to manage his own finances-sister interested in applying to be patient's financial payee]       Follow up plan: Follow up call scheduled for 08/14/23    Encounter Outcome:  Patient Visit Completed

## 2023-08-09 NOTE — Progress Notes (Signed)
BP 121/77   Pulse 80   Wt 153 lb (69.4 kg)   BMI 22.59 kg/m    Subjective:    Patient ID: Ronnie Bullock, male    DOB: 11/19/1945, 77 y.o.   MRN: [id number]  HPI: Ronnie Bullock is a 77 y.o. male presenting on 08/09/2023 for comprehensive medical examination. Current medical complaints include:  To start going to adult day program. Needs paperwork filled out today.   HYPERTENSION / HYPERLIPIDEMIA Satisfied with current treatment? yes Duration of hypertension: chronic BP monitoring frequency: not checking BP medication side effects: no Past BP meds: amlodipine Duration of hyperlipidemia: chronic Cholesterol medication side effects: N/A Cholesterol supplements: none Past cholesterol medications: none Medication compliance: good compliance Aspirin: no Recent stressors: no Recurrent headaches: no Visual changes: no Palpitations: no Dyspnea: no Chest pain: no Lower extremity edema: no Dizzy/lightheaded: no  BPH- has been having trouble emptying his bladder BPH status: exacerbated Satisfied with current treatment?: no Medication side effects: no Medication compliance: good compliance Duration: chronic Nocturia: 4-5x per night Urinary frequency:yes Incomplete voiding: yes Urgency: yes Weak urinary stream: yes Straining to start stream: yes Dysuria: no Onset: gradual Severity: severe   He currently lives with: sister Interim Problems from his last visit: no  Functional Status Survey: Is the patient deaf or have difficulty hearing?: No Does the patient have difficulty seeing, even when wearing glasses/contacts?: No Does the patient have difficulty concentrating, remembering, or making decisions?: Yes Does the patient have difficulty walking or climbing stairs?: No Does the patient have difficulty dressing or bathing?: Yes Does the patient have difficulty doing errands alone such as visiting a doctor's office or shopping?: Yes  FALL RISK:    08/09/2023    11:11 AM 05/22/2023    4:48 PM 08/02/2022   12:41 PM 07/27/2021    5:33 PM 04/02/2021    2:01 PM  Fall Risk   Falls in the past year? 0 0 0 0 0  Number falls in past yr: 0 0 0 0 0  Injury with Fall? 0 0 0 0 0  Risk for fall due to :  No Fall Risks  Impaired mobility History of fall(s)  Follow up Falls evaluation completed Falls evaluation completed;Education provided Falls evaluation completed;Education provided;Falls prevention discussed Education provided Falls evaluation completed    Depression Screen    08/09/2023   11:11 AM 06/26/2023   11:12 AM 08/02/2022   12:55 PM 01/24/2022    1:24 PM 10/22/2021   10:51 AM  Depression screen PHQ 2/9  Decreased Interest 0 0 0 0 0  Down, Depressed, Hopeless 0 0 0 0 0  PHQ - 2 Score 0 0 0 0 0  Altered sleeping  0 0 0 0  Tired, decreased energy  0 0 0 0  Change in appetite  0 0 0 0  Feeling bad or failure about yourself   0 0 0 0  Trouble concentrating  0 2 0 3  Moving slowly or fidgety/restless  2 0 0 0  Suicidal thoughts  0 0 0 0  PHQ-9 Score  2 2 0 3  Difficult doing work/chores   Not difficult at all      Advanced Directives Does patient have a HCPOA?    yes If yes, name and contact information:  Does patient have a living will or MOST form?  yes  Past Medical History:  Past Medical History:  Diagnosis Date   Dementia (HCC)    Dementia (HCC)  Hyperlipidemia    Hypertension    Incontinence     Surgical History:  Past Surgical History:  Procedure Laterality Date   COLONOSCOPY WITH PROPOFOL N/A 05/16/2018   Procedure: COLONOSCOPY WITH PROPOFOL;  Surgeon: Pasty Spillers, MD;  Location: [facility] ENDOSCOPY;  Service: Endoscopy;  Laterality: N/A;   PERIPHERAL VASCULAR THROMBECTOMY Left 01/22/2021   Procedure: PERIPHERAL VASCULAR THROMBECTOMY / THROMBOLYSIS;  Surgeon: Renford Dills, MD;  Location: [facility] INVASIVE CV LAB;  Service: Cardiovascular;  Laterality: Left;    Medications:  Current Outpatient Medications on File  Prior to Visit  Medication Sig   fluticasone (FLONASE) 50 MCG/ACT nasal spray Instill two SPRAYS into each nostril daily   Multiple Vitamin (MULTIVITAMIN WITH MINERALS) TABS tablet Take 1 tablet by mouth daily.   triamcinolone cream (KENALOG) 0.1 % Apply 1 Application topically 2 (two) times daily.   No current facility-administered medications on file prior to visit.    Allergies:  No Known Allergies  Social History:  Social History   Socioeconomic History   Marital status: Widowed    Spouse name: Not on file   Number of children: Not on file   Years of education: Not on file   Highest education level: 12th grade  Occupational History   Not on file  Tobacco Use   Smoking status: Never   Smokeless tobacco: Never  Vaping Use   Vaping status: Never Used  Substance and Sexual Activity   Alcohol use: No    Alcohol/week: 0.0 standard drinks of alcohol   Drug use: No   Sexual activity: Yes  Other Topics Concern   Not on file  Social History Narrative   Not on file   Social Determinants of Health   Financial Resource Strain: High Risk (08/02/2022)   Overall Financial Resource Strain (CARDIA)    Difficulty of Paying Living Expenses: Hard  Food Insecurity: No Food Insecurity (05/09/2023)   Hunger Vital Sign    Worried About Running Out of Food in the Last Year: Never true    Ran Out of Food in the Last Year: Never true  Transportation Needs: No Transportation Needs (05/09/2023)   PRAPARE - Administrator, Civil Service (Medical): No    Lack of Transportation (Non-Medical): No  Physical Activity: Inactive (08/02/2022)   Exercise Vital Sign    Days of Exercise per Week: 0 days    Minutes of Exercise per Session: 0 min  Stress: No Stress Concern Present (08/02/2022)   [employer] of Occupational Health - Occupational Stress Questionnaire    Feeling of Stress : Only a little  Social Connections: Socially Isolated (05/09/2023)   Social Connection and Isolation  Panel [NHANES]    Frequency of Communication with Friends and Family: Once a week    Frequency of Social Gatherings with Friends and Family: Once a week    Attends Religious Services: More than 4 times per year    Active Member of [employer] or Organizations: No    Attends Banker Meetings: Never    Marital Status: Widowed  Intimate Partner Violence: Not At Risk (08/02/2022)   Humiliation, Afraid, Rape, and Kick questionnaire    Fear of Current or Ex-Partner: No    Emotionally Abused: No    Physically Abused: No    Sexually Abused: No   Social History   Tobacco Use  Smoking Status Never  Smokeless Tobacco Never   Social History   Substance and Sexual Activity  Alcohol Use No   Alcohol/week:  0.0 standard drinks of alcohol    Family History:  Family History  Problem Relation Age of Onset   Hypertension Mother    Prostate cancer Neg Hx    Bladder Cancer Neg Hx    Kidney cancer Neg Hx     Past medical history, surgical history, medications, allergies, family history and social history reviewed with patient today and changes made to appropriate areas of the chart.   Review of Systems  Constitutional: Negative.   HENT: Negative.    Eyes: Negative.   Respiratory: Negative.    Cardiovascular: Negative.   Gastrointestinal: Negative.   Genitourinary: Negative.        Urinary retention  Musculoskeletal: Negative.   Skin: Negative.   Neurological: Negative.   Endo/Heme/Allergies: Negative.   Psychiatric/Behavioral: Negative.     All other ROS negative except what is listed above and in the HPI.      Objective:    BP 121/77   Pulse 80   Wt 153 lb (69.4 kg)   BMI 22.59 kg/m   Wt Readings from Last 3 Encounters:  08/09/23 153 lb (69.4 kg)  06/26/23 156 lb 12.8 oz (71.1 kg)  05/02/23 159 lb 3.2 oz (72.2 kg)     Physical Exam Vitals and nursing note reviewed.  Constitutional:      General: He is not in acute distress.    Appearance: Normal appearance. He  is normal weight. He is not ill-appearing, toxic-appearing or diaphoretic.  HENT:     Head: Normocephalic and atraumatic.     Right Ear: Tympanic membrane, ear canal and external ear normal. There is no impacted cerumen.     Left Ear: Tympanic membrane, ear canal and external ear normal. There is no impacted cerumen.     Nose: Nose normal. No congestion or rhinorrhea.     Mouth/Throat:     Mouth: Mucous membranes are moist.     Pharynx: Oropharynx is clear. No oropharyngeal exudate or posterior oropharyngeal erythema.  Eyes:     General: No scleral icterus.       Right eye: No discharge.        Left eye: No discharge.     Extraocular Movements: Extraocular movements intact.     Conjunctiva/sclera: Conjunctivae normal.     Pupils: Pupils are equal, round, and reactive to light.  Neck:     Vascular: No carotid bruit.  Cardiovascular:     Rate and Rhythm: Normal rate and regular rhythm.     Pulses: Normal pulses.     Heart sounds: No murmur heard.    No friction rub. No gallop.  Pulmonary:     Effort: Pulmonary effort is normal. No respiratory distress.     Breath sounds: Normal breath sounds. No stridor. No wheezing, rhonchi or rales.  Chest:     Chest wall: No tenderness.  Abdominal:     General: Abdomen is flat. Bowel sounds are normal. There is no distension.     Palpations: Abdomen is soft. There is no mass.     Tenderness: There is no abdominal tenderness. There is no right CVA tenderness, left CVA tenderness, guarding or rebound.     Hernia: No hernia is present.  Genitourinary:    Comments: Genital exam deferred with shared decision making Musculoskeletal:        General: No swelling, tenderness, deformity or signs of injury.     Cervical back: Normal range of motion and neck supple. No rigidity. No muscular tenderness.  Right lower leg: No edema.     Left lower leg: No edema.  Lymphadenopathy:     Cervical: No cervical adenopathy.  Skin:    General: Skin is warm  and dry.     Capillary Refill: Capillary refill takes less than 2 seconds.     Coloration: Skin is not jaundiced or pale.     Findings: No bruising, erythema, lesion or rash.  Neurological:     General: No focal deficit present.     Mental Status: He is alert and oriented to person, place, and time.     Cranial Nerves: No cranial nerve deficit.     Sensory: No sensory deficit.     Motor: No weakness.     Coordination: Coordination normal.     Gait: Gait normal.     Deep Tendon Reflexes: Reflexes normal.  Psychiatric:        Mood and Affect: Mood normal.        Behavior: Behavior normal.        Thought Content: Thought content normal.        Judgment: Judgment normal.        08/09/2023   11:06 AM 08/02/2022   12:38 PM 07/27/2021    5:15 PM 12/10/2018    1:22 PM 11/10/2017   11:01 AM  6CIT Screen  What Year? 4 points 4 points 4 points 0 points 0 points  What month? 3 points 0 points 0 points 0 points 0 points  What time? 0 points 3 points 3 points 0 points 0 points  Count back from 20 4 points 4 points 0 points 0 points 0 points  Months in reverse 4 points 4 points 0 points 0 points 0 points  Repeat phrase 10 points 10 points 0 points 8 points 10 points  Total Score 25 points 25 points 7 points 8 points 10 points     Results for orders placed or performed in visit on 06/26/23  Urine Culture   Specimen: Urine   UR  Result Value Ref Range   Urine Culture, Routine Final report    Organism ID, Bacteria Comment   Microscopic Examination   Urine  Result Value Ref Range   WBC, UA 0-5 0 - 5 /hpf   RBC, Urine 3-10 (A) 0 - 2 /hpf   Epithelial Cells (non renal) None seen 0 - 10 /hpf   Crystals Present (A) N/A   Crystal Type Amorphous Sediment N/A   Mucus, UA Present (A) Not Estab.   Bacteria, UA None seen None seen/Few  Urinalysis, Routine w reflex microscopic  Result Value Ref Range   Specific Gravity, UA >1.030 (H) 1.005 - 1.030   pH, UA 5.5 5.0 - 7.5   Color, UA Amber (A)  Yellow   Appearance Ur Turbid (A) Clear   Leukocytes,UA Negative Negative   Protein,UA 3+ (A) Negative/Trace   Glucose, UA Negative Negative   Ketones, UA Negative Negative   RBC, UA Trace (A) Negative   Bilirubin, UA Negative Negative   Urobilinogen, Ur 1.0 0.2 - 1.0 mg/dL   Nitrite, UA Negative Negative   Microscopic Examination See below:       Assessment & Plan:   Problem List Items Addressed This Visit       Cardiovascular and Mediastinum   Essential hypertension, benign    Under good control on current regimen. Continue current regimen. Continue to monitor. Call with any concerns. Refills given.        Relevant Medications  amLODipine (NORVASC) 5 MG tablet   apixaban (ELIQUIS) 5 MG TABS tablet     Nervous and Auditory   Dementia without behavioral disturbance (HCC)    To go to adult day program. Paperwork filled out today. Call with any concerns.       Relevant Medications   donepezil (ARICEPT) 5 MG tablet     Other   Hypercholesteremia    Stable. Continue to monitor.       Relevant Medications   amLODipine (NORVASC) 5 MG tablet   apixaban (ELIQUIS) 5 MG TABS tablet   Other Visit Diagnoses     Encounter for Medicare annual wellness exam    -  Primary   Preventatitve care discussed today as below.   Incomplete bladder emptying       Not doing well. Seeing urology in about 3 weeks. Will increase his flomax to 0.8mg . Call with any concerns.   Relevant Medications   tamsulosin (FLOMAX) 0.4 MG CAPS capsule   Needs flu shot       Flu shot given today.   Relevant Orders   Flu Vaccine Trivalent High Dose (Fluad) (Completed)        Preventative Services:  Health Risk Assessment and Personalized Prevention Plan: Done today Bone Mass Measurements: N/A CVD Screening: up to date Colon Cancer Screening: N/A Depression Screening: done today Diabetes Screening: up to date Glaucoma Screening: see your eye doctor Hepatitis B vaccine: N/A Hepatitis C  screening: up to date HIV Screening: up to date Flu Vaccine: given today Lung cancer Screening: N/A Obesity Screening: done today Pneumonia Vaccines (2): up to date STI Screening: N/A PSA screening: up to date  Discussed aspirin prophylaxis for myocardial infarction prevention and decision was it was not indicated  LABORATORY TESTING:  Health maintenance labs ordered today as discussed above.   IMMUNIZATIONS:   - Tdap: Tetanus vaccination status reviewed: last tetanus booster within 10 years. - Influenza: Administered today - Pneumovax: Up to date - Prevnar: Up to date - Zostavax vaccine: Refused  SCREENING: - Colonoscopy: N/A  Discussed with patient purpose of the colonoscopy is to detect colon cancer at curable precancerous or early stages   PATIENT COUNSELING:    Sexuality: Discussed sexually transmitted diseases, partner selection, use of condoms, avoidance of unintended pregnancy  and contraceptive alternatives.   Advised to avoid cigarette smoking.  I discussed with the patient that most people either abstain from alcohol or drink within safe limits (<=14/week and <=4 drinks/occasion for males, <=7/weeks and <= 3 drinks/occasion for females) and that the risk for alcohol disorders and other health effects rises proportionally with the number of drinks per week and how often a drinker exceeds daily limits.  Discussed cessation/primary prevention of drug use and availability of treatment for abuse.   Diet: Encouraged to adjust caloric intake to maintain  or achieve ideal body weight, to reduce intake of dietary saturated fat and total fat, to limit sodium intake by avoiding high sodium foods and not adding table salt, and to maintain adequate dietary potassium and calcium preferably from fresh fruits, vegetables, and low-fat dairy products.    stressed the importance of regular exercise  Injury prevention: Discussed safety belts, safety helmets, smoke detector, smoking near  bedding or upholstery.   Dental health: Discussed importance of regular tooth brushing, flossing, and dental visits.   Follow up plan: NEXT PREVENTATIVE PHYSICAL DUE IN 1 YEAR. Return in about 3 months (around 11/09/2023).

## 2023-08-09 NOTE — Assessment & Plan Note (Signed)
To go to adult day program. Paperwork filled out today. Call with any concerns.

## 2023-08-09 NOTE — Assessment & Plan Note (Addendum)
Stable.       - Continue to monitor

## 2023-08-09 NOTE — Patient Instructions (Addendum)
Take 2 of his flomax daily  Preventative Services:  Health Risk Assessment and Personalized Prevention Plan: Done today Bone Mass Measurements: N/A CVD Screening: up to date Colon Cancer Screening: N/A Depression Screening: done today Diabetes Screening: up to date Glaucoma Screening: see your eye doctor Hepatitis B vaccine: N/A Hepatitis C screening: up to date HIV Screening: up to date Flu Vaccine: given today Lung cancer Screening: N/A Obesity Screening: done today Pneumonia Vaccines (2): up to date STI Screening: N/A PSA screening: up to date

## 2023-08-09 NOTE — Telephone Encounter (Signed)
Spoke with Angelica Chessman at [employer] and says she has received the new prescription for the patient and is working on getting his pill pack ready for pick up.

## 2023-08-11 ENCOUNTER — Telehealth: Payer: Self-pay | Admitting: *Deleted

## 2023-08-11 NOTE — Patient Instructions (Signed)
Visit Information  Thank you for taking time to visit with me today. Please don't hesitate to contact me if I can be of assistance to you.   Following are the goals we discussed today:   Goals Addressed             This Visit's Progress    Adult Day Care Options       Activities and task to complete in order to accomplish goals.   Self Support options  (patient to start the [facility] on 08/14/23 ) Patient's sister to contact the Social Security Administration to apply to become patient's financial payee [phone]         Our next appointment is by telephone on 08/18/23 at 10am  Please call the care guide team at [phone] if you need to cancel or reschedule your appointment.   If you are experiencing a Mental Health or Behavioral Health Crisis or need someone to talk to, please call 911   Patient verbalizes understanding of instructions and care plan provided today and agrees to view in MyChart. Active MyChart status and patient understanding of how to access instructions and care plan via MyChart confirmed with patient.     Telephone follow up appointment with care management team member scheduled for: 08/18/23  Verna Czech, LCSW [place]  [facility], [facility] Licensed Clinical Social Worker Care Coordinator  Direct Dial: [phone]

## 2023-08-11 NOTE — Patient Outreach (Signed)
Care Coordination   Follow Up Visit Note   08/11/2023 Name: Ronnie Bullock MRN: [id number] DOB: 07-30-1946  Ronnie Bullock is a 77 y.o. year old male who sees Dorcas Carrow, DO for primary care. I spoke with  Ronnie Bullock's sister by phone today.  What matters to the patients health and wellness today?  Adult Day Care and financial payee options.    Goals Addressed             This Visit's Progress    Adult Day Care Options       Activities and task to complete in order to accomplish goals.   Self Support options  (patient to start the [facility] on 08/14/23 ) Patient's sister to contact the Social Security Administration to apply to become patient's financial payee [phone]         SDOH assessments and interventions completed:  No     Care Coordination Interventions:  Yes, provided   Follow up plan: Follow up call scheduled for 08/18/23    Encounter Outcome:  Patient Visit Completed

## 2023-08-14 ENCOUNTER — Encounter: Payer: Medicare HMO | Admitting: *Deleted

## 2023-08-18 ENCOUNTER — Ambulatory Visit: Payer: Self-pay | Admitting: *Deleted

## 2023-08-18 NOTE — Patient Instructions (Signed)
Visit Information  Thank you for taking time to visit with me today. Please don't hesitate to contact me if I can be of assistance to you.   Following are the goals we discussed today:  Please continue active participation with the [facility] Patient's sister to continue to work with the Social Security Administration to become patient's payee Patient/patient's family to continue to reach out to patient's provider with any questions regarding your medical care   Our next appointment is by telephone on 09/01/23 at 11am  Please call the care guide team at [phone] if you need to cancel or reschedule your appointment.   If you are experiencing a Mental Health or Behavioral Health Crisis or need someone to talk to, please call 911   Patient verbalizes understanding of instructions and care plan provided today and agrees to view in MyChart. Active MyChart status and patient understanding of how to access instructions and care plan via MyChart confirmed with patient.     Telephone follow up appointment with care management team member scheduled for: 09/01/23  Verna Czech, LCSW [place]  [facility], [facility] Licensed Clinical Social Worker Care Coordinator  Direct Dial: [phone]

## 2023-08-18 NOTE — Patient Outreach (Signed)
Care Coordination   Follow Up Visit Note   08/18/2023 Name: GAREK DICKER MRN: [id number] DOB: [birth year]  [name] is a 77 y.o. year old male who sees Dorcas Carrow, DO for primary care. I spoke with  [name] 's sister by phone today.  What matters to the patients health and wellness today?  Patient's sister confirms that patient is now active at the [facility]. Patient's sister continues to work on  Optometrist.   Goals Addressed             This Visit's Progress    community resource needs       Interventions Today    Flowsheet Row Most Recent Value  Chronic Disease   Chronic disease during today's visit Other  [dementia]  General Interventions   General Interventions Discussed/Reviewed General Interventions Reviewed, Community Resources, Level of Care  [confirmed follow up with social security-confirmed need for documentation form the provider that patient has been diagnosed with dementia, letter to be fowarded to social security.]  Level of Care Adult Daycare  [confirmed that patient has started the [facility]-church member provides transport-confirmed that patient is thriving in the [employer] provided for active participation in a structured program]  Mental Health Interventions   Mental Health Discussed/Reviewed Mental Health Reviewed, Other  [caregiver stress continues to be acknowledged, postive reinforcement provided for consistent follow up, emotional support provided]  Safety Interventions   Safety Discussed/Reviewed Safety Reviewed  [patient's sister states that patient continues to withdraw large amounts of money from his account-sister encouraged to continue to monitor patient's withdraws and complete process to become is payee.]              SDOH assessments and interventions completed:  No     Care Coordination Interventions:  Yes, provided   Follow up plan: Follow up call  scheduled for 09/01/23    Encounter Outcome:  Patient Visit Completed

## 2023-09-01 ENCOUNTER — Ambulatory Visit: Payer: Self-pay | Admitting: *Deleted

## 2023-09-01 NOTE — Patient Instructions (Signed)
Visit Information  Thank you for taking time to visit with me today. Please don't hesitate to contact me if I can be of assistance to you.   Following are the goals we discussed today:  Please continue to actively participate in the [facility] Please contact your provider with any questions regarding your medical care    Our next appointment is by telephone on 09/15/23 at 1pm  Please call the care guide team at [phone] if you need to cancel or reschedule your appointment.   If you are experiencing a Mental Health or Behavioral Health Crisis or need someone to talk to, please call 911   Patient verbalizes understanding of instructions and care plan provided today and agrees to view in MyChart. Active MyChart status and patient understanding of how to access instructions and care plan via MyChart confirmed with patient.     Telephone follow up appointment with care management team member scheduled for: 09/15/23  Verna Czech, LCSW   [facility], [facility] Licensed Clinical Social Worker Care Coordinator  Direct Dial: [phone]

## 2023-09-01 NOTE — Patient Outreach (Signed)
Care Coordination   Follow Up Visit Note   09/01/2023 Name: XAEDEN LAMARTINA MRN: [id number] DOB: 1945-12-01  [name] is a 77 y.o. year old male who sees Dorcas Carrow, DO for primary care. I spoke with  [name] by phone today.  What matters to the patients health and wellness today?  Patient's sister interested in applying for financial payee for patient to assist with managing his finances.     Goals Addressed             This Visit's Progress    community resource needs       Interventions Today    Flowsheet Row Most Recent Value  Chronic Disease   Chronic disease during today's visit Other  [dementia]  General Interventions   General Interventions Discussed/Reviewed General Interventions Reviewed, [employer], Level of Care  [Assessed for additional community resource needs-patient's sister continues to assist pt with managing his finances including househild bills- will need letter from provider recommending that patient's sister offiicially be named pt's financial payee]  Level of Care Personal Care Services, Adult Daycare  [confirmed that patient continues to attend the [facility] daily-patient's pastor assists with transportation to medical appts and food-]              SDOH assessments and interventions completed:  No     Care Coordination Interventions:  Yes, provided   Follow up plan: Follow up call scheduled for 09/15/23    Encounter Outcome:  Patient Visit Completed

## 2023-09-04 ENCOUNTER — Ambulatory Visit: Payer: Medicare HMO | Admitting: Urology

## 2023-09-05 ENCOUNTER — Other Ambulatory Visit: Payer: Self-pay | Admitting: *Deleted

## 2023-09-05 ENCOUNTER — Telehealth: Payer: Self-pay | Admitting: Family Medicine

## 2023-09-05 ENCOUNTER — Other Ambulatory Visit: Payer: Self-pay

## 2023-09-05 NOTE — Patient Instructions (Signed)
Visit Information  Thank you for taking time to visit with me today. Please don't hesitate to contact me if I can be of assistance to you before our next scheduled telephone appointment.  Following are the goals we discussed today:   Goals Addressed             This Visit's Progress    RNCM Care Management Expected Outcome:  Monitor, Self-Manage and Reduce Symptoms of: Dementia       Current Barriers:  Knowledge Deficits related to Decline in mental capacity, increased wandering, family member illness, support and educational needs for family and friends with patient with advanced dementia Care Coordination needs related to resources for help with memory changes and more care in the home in a patient with dementia Chronic Disease Management support and education needs related to effective management of dementia Lacks caregiver support.  Cognitive Deficits  Planned Interventions: Evaluation of current treatment plan related to dementia and memory changes and patient's adherence to plan as established by provider. The patients sister states that the patient is doing much  better now that he attends an adult day program and he is no longer wondering off. Advised patient to sister to reach out to the resources given to her by the SW. Continues with work with SW.  Reviewed medications with patient and discussed compliance. Sister reports patient is compliant with taking all his medications. She does express that his intake is likely inadequate. Reviewed scheduled/upcoming provider appointments including 11-10-2023 w/ PCP Social Work referral for support and education for dementia support Pharmacy referral for medication management and support. Is using the pill packaging system.  Discussed plans with patient for ongoing care management follow up and provided patient with direct contact information for care management team Advised patient to discuss changes in memory, changes in behaviors,  questions and concerns with provider Screening for signs and symptoms of depression related to chronic disease state  Assessed social determinant of health barriers  Symptom Management: Take medications as prescribed   Attend all scheduled provider appointments Call provider office for new concerns or questions  call the Suicide and Crisis Lifeline: 988 call the [place] National Suicide Prevention Lifeline: 920 094 1197 or TTY: (332)127-8743 TTY 669-392-1192) to talk to a trained counselor call 1-800-273-TALK (toll free, 24 hour hotline) if experiencing a Mental Health or Behavioral Health Crisis   Follow Up Plan: Telephone follow up appointment with care management team member scheduled for: 10-17-2023 at 1030 am       RNCM Care Management Expected Outcome:  Monitor, Self-Manage, and Reduce Symptoms of Hypertension       Current Barriers:  Knowledge Deficits related to keeping appointments with the pcp, following the plan of care, and taking medications as prescribed  Care Coordination needs related to patients wandering and leaving the home and sometimes not returning to late, brother who he resides with is ill, sister lives in [place] who is POA in a patient with HTN and other chronic conditions Chronic Disease Management support and education needs related to Effective management of HTN Lacks caregiver support.  Cognitive Deficits BP Readings from Last 3 Encounters:  08/09/23 121/77  05/02/23 (!) 162/88  08/04/22 138/80     Planned Interventions: Evaluation of current treatment plan related to hypertension self management and patient's adherence to plan as established by provider. The patient is compliant with care plan and taking all medications as prescribed.  Provided education to patient re: stroke prevention, s/s of heart attack and stroke; Reviewed prescribed  diet heart healthy diet  Reviewed medications with patient and discussed importance of compliance; per sister, patient is  compliant with taking his medications Discussed plans with patient for ongoing care management follow up and provided patient with direct contact information for care management team; Advised patient, providing education and rationale, to monitor blood pressure daily and record, calling PCP for findings outside established parameters;  Reviewed scheduled/upcoming provider appointments including: Saw the pcp on Monday, knows to call for changes Advised patient to discuss changes in HTN or heart health with provider; Provided education on prescribed diet heart healthy;  Discussed complications of poorly controlled blood pressure such as heart disease, stroke, circulatory complications, vision complications, kidney impairment, sexual dysfunction;  Screening for signs and symptoms of depression related to chronic disease state;  Assessed social determinant of health barriers;   Symptom Management: Take medications as prescribed   Attend all scheduled provider appointments Call provider office for new concerns or questions  call the Suicide and Crisis Lifeline: 988 call the [place] National Suicide Prevention Lifeline: 254-465-4405 or TTY: (760) 046-6871 TTY 706-001-8528) to talk to a trained counselor call 1-800-273-TALK (toll free, 24 hour hotline) if experiencing a Mental Health or Behavioral Health Crisis  check blood pressure weekly learn about high blood pressure call doctor for signs and symptoms of high blood pressure keep all doctor appointments take medications for blood pressure exactly as prescribed report new symptoms to your doctor  Follow Up Plan: Telephone follow up appointment with care management team member scheduled for: 10-17-2023 at 1030 am           Our next appointment is by telephone on 10-17-2023 at 10:30 am  Please call the care guide team at [phone] if you need to cancel or reschedule your appointment.   If you are experiencing a Mental Health or  Behavioral Health Crisis or need someone to talk to, please call the Suicide and Crisis Lifeline: 988 call the [place] National Suicide Prevention Lifeline: 716-398-2737 or TTY: 970-470-1960 TTY 808-447-1582) to talk to a trained counselor call 1-800-273-TALK (toll free, 24 hour hotline) call 911   Patient verbalizes understanding of instructions and care plan provided today and agrees to view in MyChart. Active MyChart status and patient understanding of how to access instructions and care plan via MyChart confirmed with patient.     Telephone follow up appointment with care management team member scheduled for: 10-17-2023 at 10:30 am  Danise Edge, BSN RN RN Care Manager  [facility]  Ambulatory Care Management  Direct Number: [phone]

## 2023-09-05 NOTE — Patient Outreach (Signed)
Care Management   Visit Note  09/05/2023 Name: BARNIE SHALHOUB MRN: [id number] DOB: 01/21/1946  Subjective: Ronnie Bullock is a 77 y.o. year old male who is a primary care patient of Dorcas Carrow, DO. The Care Management team was consulted for assistance.      Engaged with patient spoke with patient by telephone.    Goals Addressed             This Visit's Progress    RNCM Care Management Expected Outcome:  Monitor, Self-Manage and Reduce Symptoms of: Dementia       Current Barriers:  Knowledge Deficits related to Decline in mental capacity, increased wandering, family member illness, support and educational needs for family and friends with patient with advanced dementia Care Coordination needs related to resources for help with memory changes and more care in the home in a patient with dementia Chronic Disease Management support and education needs related to effective management of dementia Lacks caregiver support.  Cognitive Deficits  Planned Interventions: Evaluation of current treatment plan related to dementia and memory changes and patient's adherence to plan as established by provider. The patients sister states that the patient is doing much  better now that he attends an adult day program and he is no longer wondering off. Advised patient to sister to reach out to the resources given to her by the SW. Continues with work with SW.  Reviewed medications with patient and discussed compliance. Sister reports patient is compliant with taking all his medications. She does express that his intake is likely inadequate. Reviewed scheduled/upcoming provider appointments including 11-10-2023 w/ PCP Social Work referral for support and education for dementia support Pharmacy referral for medication management and support. Is using the pill packaging system.  Discussed plans with patient for ongoing care management follow up and provided patient with direct contact information  for care management team Advised patient to discuss changes in memory, changes in behaviors, questions and concerns with provider Screening for signs and symptoms of depression related to chronic disease state  Assessed social determinant of health barriers  Symptom Management: Take medications as prescribed   Attend all scheduled provider appointments Call provider office for new concerns or questions  call the Suicide and Crisis Lifeline: 988 call the [place] National Suicide Prevention Lifeline: 807-377-3188 or TTY: 716-216-6615 TTY 903-551-4905) to talk to a trained counselor call 1-800-273-TALK (toll free, 24 hour hotline) if experiencing a Mental Health or Behavioral Health Crisis   Follow Up Plan: Telephone follow up appointment with care management team member scheduled for: 10-17-2023 at 1030 am       RNCM Care Management Expected Outcome:  Monitor, Self-Manage, and Reduce Symptoms of Hypertension       Current Barriers:  Knowledge Deficits related to keeping appointments with the pcp, following the plan of care, and taking medications as prescribed  Care Coordination needs related to patients wandering and leaving the home and sometimes not returning to late, brother who he resides with is ill, sister lives in [place] who is POA in a patient with HTN and other chronic conditions Chronic Disease Management support and education needs related to Effective management of HTN Lacks caregiver support.  Cognitive Deficits BP Readings from Last 3 Encounters:  08/09/23 121/77  05/02/23 (!) 162/88  08/04/22 138/80     Planned Interventions: Evaluation of current treatment plan related to hypertension self management and patient's adherence to plan as established by provider. The patient is compliant with care plan and taking  all medications as prescribed.  Provided education to patient re: stroke prevention, s/s of heart attack and stroke; Reviewed prescribed diet heart healthy diet   Reviewed medications with patient and discussed importance of compliance; per sister, patient is compliant with taking his medications Discussed plans with patient for ongoing care management follow up and provided patient with direct contact information for care management team; Advised patient, providing education and rationale, to monitor blood pressure daily and record, calling PCP for findings outside established parameters;  Reviewed scheduled/upcoming provider appointments including: Saw the pcp on Monday, knows to call for changes Advised patient to discuss changes in HTN or heart health with provider; Provided education on prescribed diet heart healthy;  Discussed complications of poorly controlled blood pressure such as heart disease, stroke, circulatory complications, vision complications, kidney impairment, sexual dysfunction;  Screening for signs and symptoms of depression related to chronic disease state;  Assessed social determinant of health barriers;   Symptom Management: Take medications as prescribed   Attend all scheduled provider appointments Call provider office for new concerns or questions  call the Suicide and Crisis Lifeline: 988 call the [place] National Suicide Prevention Lifeline: (737) 146-8061 or TTY: 680-630-5805 TTY 956-277-1173) to talk to a trained counselor call 1-800-273-TALK (toll free, 24 hour hotline) if experiencing a Mental Health or Behavioral Health Crisis  check blood pressure weekly learn about high blood pressure call doctor for signs and symptoms of high blood pressure keep all doctor appointments take medications for blood pressure exactly as prescribed report new symptoms to your doctor  Follow Up Plan: Telephone follow up appointment with care management team member scheduled for: 10-17-2023 at 1030 am             Consent to Services:  Patient was given information about care management services, agreed to services, and gave verbal  consent to participate.   Plan: Telephone follow up appointment with care management team member scheduled for: 10-17-2023 at 10:30 am  Danise Edge, BSN RN RN Care Manager  [facility]  Ambulatory Care Management  Direct Number: [phone]

## 2023-09-05 NOTE — Telephone Encounter (Signed)
-----   Message from Marlowe Sax sent at 09/05/2023 11:27 AM EDT ----- Regarding: message from the patients sister Hey Dr. Laural Benes,  I spoke to [facility] this am and she wanted me to tell you that Romon is well and happy. Since going to the day program he is not wandering, he is taking his medications, and he is taking care of himself. She is so happy for the positive change she is seeing in [place].  Thanks, Pam

## 2023-09-12 ENCOUNTER — Telehealth: Payer: Self-pay

## 2023-09-12 NOTE — Patient Outreach (Signed)
  Care Management   Visit Note  09/12/2023 Name: Ronnie Bullock MRN: [id number] DOB: 13-Sep-1946  Subjective: [name] is a 77 y.o. year old male who is a primary care patient of Dorcas Carrow, DO. The Care Management team was consulted for assistance.      Engaged with patient spoke with the family member (POA, [place], [place]).   The patients sister called and left a VM asking for a call back. She wanted to verifiy the patients next appointments coming up. Assisted with letting the patients sister know that the next appointment was Friday 09-15-2023 with the SW. Also reviewed the appointments with RNCM, specialist and pcp. No other information needed at this time. The patients sister will call for any new changes or needs.   Consent to Services:  Patient was given information about care management services, agreed to services, and gave verbal consent to participate.   Plan: Telephone follow up appointment with care management team member scheduled for: 10-17-2023 at 1030 am with the [facility]  [place]to [place] RN, MSN, CCM RN Care Manager  [facility]  Ambulatory Care Management  Direct Number: [phone]

## 2023-09-15 ENCOUNTER — Ambulatory Visit: Payer: Self-pay | Admitting: *Deleted

## 2023-09-18 NOTE — Patient Instructions (Signed)
Visit Information  Thank you for taking time to visit with me today. Please don't hesitate to contact me if I can be of assistance to you.   Following are the goals we discussed today:  CSW will request letter from MD regarding financial payee request Patient to continue to actively participate in the Adult Day Program Patient/patient's family to keep all scheduled medical appointments Please contact your provider with any questions/concerns regarding your medical care   Our next appointment is by telephone on 10/02/23 at 1pm  Please call the care guide team at [phone] if you need to cancel or reschedule your appointment.   If you are experiencing a Mental Health or Behavioral Health Crisis or need someone to talk to, please call 911   Patient verbalizes understanding of instructions and care plan provided today and agrees to view in MyChart. Active MyChart status and patient understanding of how to access instructions and care plan via MyChart confirmed with patient.     Telephone follow up appointment with care management team member scheduled for: 10/02/23  Verna Czech, LCSW [place]  [facility], [facility] Licensed Clinical Social Worker Care Coordinator  Direct Dial: [phone]

## 2023-09-18 NOTE — Patient Outreach (Signed)
  Care Coordination   Follow Up Visit Note   09/18/2023 late entry Name: Ronnie Bullock MRN: [id number] DOB: Jun 08, 1946  Ronnie Bullock is a 77 y.o. year old male who sees Dorcas Carrow, DO for primary care. I spoke with  Ronnie Bullock's sister  by phone on 09/15/23.  What matters to the patients health and wellness today?  Patient's sister interested in applying for financial payee for patient to assist with managing his finances.  Letter from MD recommending this is required   Goals Addressed             This Visit's Progress    community resource needs       Interventions Today    Flowsheet Row Most Recent Value  Chronic Disease   Chronic disease during today's visit Other  [dementia]  General Interventions   General Interventions Discussed/Reviewed General Interventions Reviewed, [employer], Doctor Visits  [needs assessment completed-patient's sister continues to assist with pts overall care-will continue to manage pts finances-confirmed need for letter from MD to officially establish patient's sister as financial payee-]  Doctor Visits Discussed/Reviewed Doctor Visits Reviewed  [Urologist 11/06/23 PCP 11/10/23]  Level of Care Personal Care Services, Adult Daycare  [patient active with [facility]-continues to participate fully-transporation continues to be provided by patient's pastor until ACTA can be arranged]              SDOH assessments and interventions completed:  No     Care Coordination Interventions:  Yes, provided   Follow up plan: Follow up call scheduled for 10/02/23    Encounter Outcome:  Patient Visit Completed

## 2023-10-02 ENCOUNTER — Encounter: Payer: Self-pay | Admitting: *Deleted

## 2023-10-09 ENCOUNTER — Encounter: Payer: Self-pay | Admitting: *Deleted

## 2023-10-10 ENCOUNTER — Encounter: Payer: Self-pay | Admitting: *Deleted

## 2023-10-12 ENCOUNTER — Encounter: Payer: Self-pay | Admitting: *Deleted

## 2023-10-17 ENCOUNTER — Ambulatory Visit: Payer: Self-pay | Admitting: *Deleted

## 2023-10-17 NOTE — Patient Outreach (Signed)
Care Coordination   Follow Up Visit Note   10/17/2023 Name: OMEIR ZENDER MRN: [id number] DOB: 08/09/1946  [name] is a 77 y.o. year old male who sees Dorcas Carrow, DO for primary care. I spoke with sister of VLAD ASAI by phone today.  What matters to the patients health and wellness today?  Sister is very happy to have patient settled in the day program, report this has been a tremendous help for her and their family.  Denies any urgent concerns, encouraged to contact this care manager with questions.     Goals Addressed             This Visit's Progress    Management of chronic medical conditions       Interventions Today    Flowsheet Row Most Recent Value  Chronic Disease   Chronic disease during today's visit Hypertension (HTN), Other  [Dementia]  General Interventions   General Interventions Discussed/Reviewed General Interventions Reviewed, Level of Care, Doctor Visits  Doctor Visits Discussed/Reviewed Doctor Visits Reviewed, PCP, Specialist  [upcoming with urology 12/30 and PCP 1/3]  PCP/Specialist Visits Compliance with follow-up visit  Level of Care Adult Daycare  [Active with [facility]]  Education Interventions   Education Provided Provided Education  Provided Verbal Education On Medication, When to see the doctor, Nutrition  [Encouraged sister to monitor blood pressure on regular basis]  Nutrition Interventions   Nutrition Discussed/Reviewed Nutrition Reviewed, Decreasing salt, Adding fruits and vegetables, Decreasing fats           COMPLETED: RNCM Care Management Expected Outcome:  Monitor, Self-Manage and Reduce Symptoms of: Dementia   On track    Current Barriers:  Knowledge Deficits related to Decline in mental capacity, increased wandering, family member illness, support and educational needs for family and friends with patient with advanced dementia Care Coordination needs related to resources for help with memory changes  and more care in the home in a patient with dementia Chronic Disease Management support and education needs related to effective management of dementia Lacks caregiver support.  Cognitive Deficits  Planned Interventions: Evaluation of current treatment plan related to dementia and memory changes and patient's adherence to plan as established by provider. The patients sister states that the patient is doing much  better now that he attends an adult day program and he is no longer wondering off. Advised patient to sister to reach out to the resources given to her by the SW. Continues with work with SW.  Reviewed medications with patient and discussed compliance. Sister reports patient is compliant with taking all his medications. She does express that his intake is likely inadequate. Reviewed scheduled/upcoming provider appointments including 11-10-2023 w/ PCP Social Work referral for support and education for dementia support Pharmacy referral for medication management and support. Is using the pill packaging system.  Discussed plans with patient for ongoing care management follow up and provided patient with direct contact information for care management team Advised patient to discuss changes in memory, changes in behaviors, questions and concerns with provider Screening for signs and symptoms of depression related to chronic disease state  Assessed social determinant of health barriers  Symptom Management: Take medications as prescribed   Attend all scheduled provider appointments Call provider office for new concerns or questions  call the Suicide and Crisis Lifeline: 988 call the [place] National Suicide Prevention Lifeline: 847 573 6923 or TTY: 223-788-8550 TTY (912)098-5562) to talk to a trained counselor call 1-800-273-TALK (toll free, 24 hour  hotline) if experiencing a Mental Health or Behavioral Health Crisis   Follow Up Plan: Telephone follow up appointment with care management team  member scheduled for: 10-17-2023 at 1030 am       COMPLETED: [facility] Care Management Expected Outcome:  Monitor, Self-Manage, and Reduce Symptoms of Hypertension   On track    Current Barriers:  Knowledge Deficits related to keeping appointments with the pcp, following the plan of care, and taking medications as prescribed  Care Coordination needs related to patients wandering and leaving the home and sometimes not returning to late, brother who he resides with is ill, sister lives in [place] who is POA in a patient with HTN and other chronic conditions Chronic Disease Management support and education needs related to Effective management of HTN Lacks caregiver support.  Cognitive Deficits BP Readings from Last 3 Encounters:  08/09/23 121/77  05/02/23 (!) 162/88  08/04/22 138/80     Planned Interventions: Evaluation of current treatment plan related to hypertension self management and patient's adherence to plan as established by provider. The patient is compliant with care plan and taking all medications as prescribed.  Provided education to patient re: stroke prevention, s/s of heart attack and stroke; Reviewed prescribed diet heart healthy diet  Reviewed medications with patient and discussed importance of compliance; per sister, patient is compliant with taking his medications Discussed plans with patient for ongoing care management follow up and provided patient with direct contact information for care management team; Advised patient, providing education and rationale, to monitor blood pressure daily and record, calling PCP for findings outside established parameters;  Reviewed scheduled/upcoming provider appointments including: Saw the pcp on Monday, knows to call for changes Advised patient to discuss changes in HTN or heart health with provider; Provided education on prescribed diet heart healthy;  Discussed complications of poorly controlled blood pressure such as heart disease, stroke,  circulatory complications, vision complications, kidney impairment, sexual dysfunction;  Screening for signs and symptoms of depression related to chronic disease state;  Assessed social determinant of health barriers;   Symptom Management: Take medications as prescribed   Attend all scheduled provider appointments Call provider office for new concerns or questions  call the Suicide and Crisis Lifeline: 988 call the [place] National Suicide Prevention Lifeline: 3207331646 or TTY: (832)010-0311 TTY 4156752278) to talk to a trained counselor call 1-800-273-TALK (toll free, 24 hour hotline) if experiencing a Mental Health or Behavioral Health Crisis  check blood pressure weekly learn about high blood pressure call doctor for signs and symptoms of high blood pressure keep all doctor appointments take medications for blood pressure exactly as prescribed report new symptoms to your doctor  Follow Up Plan: Telephone follow up appointment with care management team member scheduled for: 10-17-2023 at 1030 am          SDOH assessments and interventions completed:  No     Care Coordination Interventions:  Yes, provided   Follow up plan: Follow up call scheduled for 2/5    Encounter Outcome:  Patient Visit Completed   Rodney Langton, RN, MSN, CCM [place]  [facility], [facility] RN Care Coordinator Direct Dial: [phone] / Main [phone] Fax [phone] Email: [email or web address]: [email or web address]

## 2023-10-17 NOTE — Patient Outreach (Signed)
  Care Coordination   10/17/2023 Name: Ronnie Bullock MRN: [id number] DOB: 1946-04-11   Care Coordination Outreach Attempts:  An unsuccessful telephone outreach was attempted for a scheduled appointment today.  Follow Up Plan:  Additional outreach attempts will be made to offer the patient care coordination information and services.   Encounter Outcome:  No Answer   Care Coordination Interventions:  No, not indicated    Rodney Langton, RN, MSN, CCM [place]  [facility], [facility] RN Care Coordinator Direct Dial: [phone] / Main [phone] Fax [phone] Email: [email or web address] Website: [email or web address]

## 2023-11-03 ENCOUNTER — Other Ambulatory Visit: Payer: Self-pay | Admitting: Family Medicine

## 2023-11-03 DIAGNOSIS — R339 Retention of urine, unspecified: Secondary | ICD-10-CM

## 2023-11-06 ENCOUNTER — Ambulatory Visit: Payer: Medicare HMO | Admitting: Urology

## 2023-11-06 ENCOUNTER — Encounter: Payer: Self-pay | Admitting: Urology

## 2023-11-06 VITALS — BP 131/83 | HR 85

## 2023-11-06 DIAGNOSIS — R350 Frequency of micturition: Secondary | ICD-10-CM | POA: Diagnosis not present

## 2023-11-06 DIAGNOSIS — C61 Malignant neoplasm of prostate: Secondary | ICD-10-CM

## 2023-11-06 DIAGNOSIS — R32 Unspecified urinary incontinence: Secondary | ICD-10-CM

## 2023-11-06 LAB — BLADDER SCAN AMB NON-IMAGING

## 2023-11-06 MED ORDER — TAMSULOSIN HCL 0.4 MG PO CAPS: 0.8000 mg | ORAL_CAPSULE | Freq: Every day | ORAL | 11 refills | Status: DC

## 2023-11-06 NOTE — Progress Notes (Signed)
11/06/2023 11:01 AM   Ronnie Bullock 1946-05-11 [id number]  Referring provider: Providence Crosby, PA-C [address] [place],  [place] [address]  Chief Complaint  Patient presents with   New Patient (Initial Visit)   Urinary Incontinence    HPI: Dr. 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  Apparently developed retention at some point in time afterwards.  Was seen for scrotal swelling.  Has a history of dementia.  Had a bladder residual of 130 mL  Patient reports that he has been voiding well every 1-2 hours and gets up once at night.  He is on Flomax 0.8 mg daily.  No blood in urine.  No urinary tract infections.  He and his family did not know why he was sent back to [place] today.  He had a negative urine culture June 27, 2023.  His PSA 6 months ago May 02, 2023 was 0.1 and 4 years ago was 5.7.  He had microscopic hematuria June 26, 2023 but none on previous examinations.  He has not had a recent renal x-ray.  Postvoid residual was 79 mL       PMH: Past Medical History:  Diagnosis Date   Dementia (HCC)    Dementia (HCC)    Hyperlipidemia    Hypertension    Incontinence     Surgical History: Past Surgical History:  Procedure Laterality Date   COLONOSCOPY WITH PROPOFOL N/A 05/16/2018   Procedure: COLONOSCOPY WITH PROPOFOL;  Surgeon: Pasty Spillers, MD;  Location: [facility] ENDOSCOPY;  Service: Endoscopy;  Laterality: N/A;   PERIPHERAL VASCULAR THROMBECTOMY Left 01/22/2021   Procedure: PERIPHERAL VASCULAR THROMBECTOMY / THROMBOLYSIS;  Surgeon: Renford Dills, MD;  Location: [facility] INVASIVE CV LAB;  Service: Cardiovascular;  Laterality: Left;    Home Medications:  Allergies as of 11/06/2023   No Known Allergies      Medication List        Accurate as of November 06, 2023 11:01 AM. If you have any questions, ask your nurse or doctor.          amLODipine 5 MG tablet Commonly known as: NORVASC Take  1 tablet (5 mg total) by mouth every morning.   apixaban 5 MG Tabs tablet Commonly known as: Eliquis Take 1 tablet (5 mg total) by mouth 2 (two) times daily.   donepezil 5 MG tablet Commonly known as: ARICEPT TAKE ONE TABLET BY MOUTH EVERYDAY AT BEDTIME   fluticasone 50 MCG/ACT nasal spray Commonly known as: FLONASE Instill two SPRAYS into each nostril daily   multivitamin with minerals Tabs tablet Take 1 tablet by mouth daily.   tamsulosin 0.4 MG Caps capsule Commonly known as: FLOMAX Take 2 capsules (0.8 mg total) by mouth daily. TAKE ONE CAPSULE BY MOUTH EVERYDAY AT BEDTIME   triamcinolone cream 0.1 % Commonly known as: KENALOG Apply 1 Application topically 2 (two) times daily.        Allergies: No Known Allergies  Family History: Family History  Problem Relation Age of Onset   Hypertension Mother    Prostate cancer Neg Hx    Bladder Cancer Neg Hx    Kidney cancer Neg Hx     Social History:  reports that he has never smoked. He has never been exposed to tobacco smoke. He has never used smokeless tobacco. He reports that he does not drink alcohol and does not use drugs.  ROS:  Physical Exam: BP (!) 153/106   Pulse 82   Constitutional:  Alert and oriented, No acute distress. HEENT: [place] AT, moist mucus membranes.  Trachea midline, no masses.   Laboratory Data: Lab Results  Component Value Date   WBC 4.3 05/02/2023   HGB 13.8 05/02/2023   HCT 40.9 05/02/2023   MCV 90 05/02/2023   PLT 205 05/02/2023    Lab Results  Component Value Date   CREATININE 1.27 05/02/2023    No results found for: "PSA"  No results found for: "TESTOSTERONE"  No results found for: "HGBA1C"  Urinalysis    Component Value Date/Time   COLORURINE YELLOW (A) 02/12/2021 1658   APPEARANCEUR Turbid (A) 06/26/2023 1146   LABSPEC 1.017 02/12/2021 1658   PHURINE 5.0 02/12/2021 1658   GLUCOSEU Negative 06/26/2023 1146    HGBUR NEGATIVE 02/12/2021 1658   BILIRUBINUR Negative 06/26/2023 1146   KETONESUR NEGATIVE 02/12/2021 1658   PROTEINUR 3+ (A) 06/26/2023 1146   PROTEINUR 100 (A) 02/12/2021 1658   UROBILINOGEN 0.2 10/30/2020 1403   NITRITE Negative 06/26/2023 1146   NITRITE NEGATIVE 02/12/2021 1658   LEUKOCYTESUR Negative 06/26/2023 1146   LEUKOCYTESUR NEGATIVE 02/12/2021 1658    Pertinent Imaging: Urine no blood.  Assessment & Plan: Patient is doing well clinically.  He will see Dr. Lonna Cobb in 6 months with a PSA for regular cancer follow-ups.  Another urinalysis can be checked.  Overall doing very well.  1. Urinary incontinence, unspecified type (Primary)  - Urinalysis, Complete - CULTURE, URINE COMPREHENSIVE   No follow-ups on file.  Martina Sinner, MD  [facility] [address] [place], [place] [address] [phone]

## 2023-11-07 LAB — MICROSCOPIC EXAMINATION

## 2023-11-07 LAB — URINALYSIS, COMPLETE
Bilirubin, UA: NEGATIVE
Glucose, UA: NEGATIVE
Ketones, UA: NEGATIVE
Leukocytes,UA: NEGATIVE
Nitrite, UA: NEGATIVE
Specific Gravity, UA: >1.03 — ABNORMAL HIGH (ref 1.005–1.030)
Urobilinogen, Ur: 1 mg/dL (ref 0.2–1.0)
pH, UA: 5.5 (ref 5.0–7.5)

## 2023-11-09 LAB — CULTURE, URINE COMPREHENSIVE

## 2023-11-09 NOTE — Telephone Encounter (Signed)
 Requested Prescriptions  Pending Prescriptions Disp Refills   tamsulosin  (FLOMAX ) 0.4 MG CAPS capsule [Pharmacy Med Name: TAMSULOSIN  HCL 0.4 MG CAP] 180 capsule 0    Sig: TAKE 2 CAPSULES BY MOUTH ONCE DAILY AT BEDTIME     Urology: Alpha-Adrenergic Blocker Passed - 11/09/2023  7:56 AM      Passed - PSA in normal range and within 360 days    Prostate Specific Ag, Serum  Date Value Ref Range Status  05/02/2023 0.1 0.0 - 4.0 ng/mL Final    Comment:    Roche ECLIA methodology. According to the American Urological Association, Serum PSA should decrease and remain at undetectable levels after radical prostatectomy. The AUA defines biochemical recurrence as an initial PSA value 0.2 ng/mL or greater followed by a subsequent confirmatory PSA value 0.2 ng/mL or greater. Values obtained with different assay methods or kits cannot be used interchangeably. Results cannot be interpreted as absolute evidence of the presence or absence of malignant disease.          Passed - Last BP in normal range    BP Readings from Last 1 Encounters:  11/06/23 131/83         Passed - Valid encounter within last 12 months    Recent Outpatient Visits           3 months ago Encounter for [employer] annual wellness exam   [place] [facility] [place], Megan P, DO   4 months ago Urinary incontinence, unspecified type   [place] [facility] Mec[place], Erin E, PA-C   6 months ago Coronary artery disease of native artery of native heart with stable angina pectoris [facility])   [facility] [place], Megan P, DO   1 year ago Routine general medical examination at a health care facility   [facility] [place], [place] P, DO   1 year ago Dementia without behavioral disturbance [facility])   [place] [facility] [place], Duwaine SQUIBB, DO       Future Appointments             Tomorrow Vicci Duwaine SQUIBB, DO [place] [facility],  PEC   In 5 months Stoioff, Glendia BROCKS, MD [facility]

## 2023-11-10 ENCOUNTER — Ambulatory Visit: Payer: Medicare HMO | Admitting: Family Medicine

## 2023-12-04 ENCOUNTER — Telehealth: Payer: Self-pay | Admitting: *Deleted

## 2023-12-04 DIAGNOSIS — I82409 Acute embolism and thrombosis of unspecified deep veins of unspecified lower extremity: Secondary | ICD-10-CM

## 2023-12-04 NOTE — Patient Instructions (Signed)
Visit Information  Thank you for taking time to visit with me today. Please don't hesitate to contact me if I can be of assistance to you.   Following are the goals we discussed today:   Goals Addressed             This Visit's Progress    Options for medication costs       Activities and task to complete in order to accomplish goals.   REFERRAL PLACED BY SOCIAL WORK  Continue to discuss medication coverage options with pharmacist          If you are experiencing a Mental Health or Behavioral Health Crisis or need someone to talk to, please call 911   Patient verbalizes understanding of instructions and care plan provided today and agrees to view in MyChart. Active MyChart status and patient understanding of how to access instructions and care plan via MyChart confirmed with patient.     No further follow up required: referral placed to pharmacist to follow up on costs of Eliquis  Owais Pruett, [employer]  [facility], [facility] Licensed Clinical Social Worker Care Coordinator  Direct Dial: [phone]

## 2023-12-04 NOTE — Patient Outreach (Signed)
  Care Coordination   Follow Up Visit Note   12/04/2023 Name: Ronnie Bullock MRN: [id number] DOB: 21-Aug-1946  Ronnie Bullock is a 78 y.o. year old male who sees Dorcas Carrow, DO for primary care. I spoke with  Ronnie Bullock by phone today.  What matters to the patients health and wellness today?  Patient's sister requesting assistance with medication costs    Goals Addressed             This Visit's Progress    Options for medication costs       Activities and task to complete in order to accomplish goals.   REFERRAL PLACED BY SOCIAL WORK  Continue to discuss medication coverage options with pharmacist          SDOH assessments and interventions completed:  No     Care Coordination Interventions:  Yes, provided  Interventions Today    Flowsheet Row Most Recent Value  Chronic Disease   Chronic disease during today's visit Other, Hypertension (HTN)  [dementia without behavioral disturbances]  General Interventions   General Interventions Discussed/Reviewed General Interventions Reviewed, Level of Care  [Patient assessed for additional community resource needs. Confirmed that heating and air has been repairted, obtained a new oven and fire alarm, patient's sister continues to manage patient's finances]  Level of Care Personal Care Services, Adult Daycare  [Active with [facility], patient's pastor checks on patient regularly for needs and transportation to medical appointments]  Pharmacy Interventions   Pharmacy Dicussed/Reviewed Pharmacy Topics Discussed, Referral to Pharmacist  [confirmed that Elquis price increased to 297.00-options discussed with pharmacist and provided to patient's sister]  Referral to Pharmacist Cannot afford medications  [Costs of Eliquis]       Follow up plan: No further intervention required.   Encounter Outcome:  Patient Visit Completed

## 2023-12-05 ENCOUNTER — Other Ambulatory Visit: Payer: Self-pay | Admitting: Family Medicine

## 2023-12-07 NOTE — Telephone Encounter (Signed)
Requested Prescriptions  Pending Prescriptions Disp Refills   fluticasone (FLONASE) 50 MCG/ACT nasal spray [Pharmacy Med Name: FLUTICASONE PROPIONATE 50 MCG/ACT N] 16 g 2    Sig: USE 2 SPRAYS INTO EACH NOSTRIL DAILY     Ear, Nose, and Throat: Nasal Preparations - Corticosteroids Passed - 12/07/2023  1:27 PM      Passed - Valid encounter within last 12 months    Recent Outpatient Visits           4 months ago Encounter for [employer] annual wellness exam   [place] [facility] [place], Megan P, DO   5 months ago Urinary incontinence, unspecified type   [place] [facility] Mecum, Erin E, PA-C   7 months ago Coronary artery disease of native artery of native heart with stable angina pectoris [facility])   [place] [facility] [place], Megan P, DO   1 year ago Routine general medical examination at a health care facility   [facility] [place], [place] P, DO   1 year ago Dementia without behavioral disturbance [facility])   [place] [facility] Dorcas Carrow, DO       Future Appointments             In 4 months Stoioff, Verna Czech, MD [facility] [place]

## 2023-12-08 ENCOUNTER — Other Ambulatory Visit: Payer: Self-pay

## 2023-12-08 NOTE — Progress Notes (Signed)
   12/08/2023  Patient ID: [name], male   DOB: [birth year], 78 y.o.   MRN: [id number]  Subjective/Objective Telephone visit with patient's sister (DPR) to assist with affordability of Eliquis in response to referral from patient's PCP, Dr. Laural Benes.  Medication adherence/affordability -Patient is prescribed Eliquis 5 mg twice daily; and when patient's sister went to pick medication up this month, patient's co-pay was going to be $300.  This is not affordable long-term for the patient. -Patient's sister is unsure if his Medicare D plan have a deductible that will need to be met before co-pays kick in; but she does state prescription previously was $45 a month. -Patient would not qualify for patient assistance plan at this point based on their out-of-pocket expenditure requirement.  Assessment/Plan  Medication adherence/affordability -Contacted patient's Medicare plan, and he has a $250 deductible -Deductible will be satisfied once the first fill of Eliquis this year is picked up and paid for; then co-pay will go down to $47 a month -Patient's sister endorses ability to pay for first month at $300 and following months at $47  Follow up: Provided my direct phone number in case any future needs related to Eliquis or other medications arrive  Lenna Gilford, PharmD, DPLA

## 2023-12-13 ENCOUNTER — Ambulatory Visit: Payer: Medicare HMO | Admitting: Nurse Practitioner

## 2023-12-13 ENCOUNTER — Ambulatory Visit: Payer: Self-pay | Admitting: *Deleted

## 2023-12-13 ENCOUNTER — Encounter: Payer: Self-pay | Admitting: Nurse Practitioner

## 2023-12-13 VITALS — BP 126/88 | HR 85 | Temp 97.6°F | Ht 69.0 in | Wt 143.4 lb

## 2023-12-13 DIAGNOSIS — J069 Acute upper respiratory infection, unspecified: Secondary | ICD-10-CM | POA: Diagnosis not present

## 2023-12-13 MED ORDER — CETIRIZINE HCL 10 MG PO TABS: 10.0000 mg | ORAL_TABLET | Freq: Every day | ORAL | 11 refills | Status: DC

## 2023-12-13 NOTE — Patient Instructions (Signed)
 Visit Information  Thank you for taking time to visit with me today. Please don't hesitate to contact me if I can be of assistance to you before our next scheduled telephone appointment.  Following are the goals we discussed today:  Use Flonase  and Zyrtec  as directed.   Our next appointment is by telephone on 5/5  Please call the care guide team at [phone] if you need to cancel or reschedule your appointment.   Please call the Suicide and Crisis Lifeline: 988 call the [place]  National Suicide Prevention Lifeline: 801-700-1770 or TTY: (908) 626-0215 TTY (639) 686-2128) to talk to a trained counselor call 1-800-273-TALK (toll free, 24 hour hotline) call 911 if you are experiencing a Mental Health or Behavioral Health Crisis or need someone to talk to.  Patient verbalizes understanding of instructions and care plan provided today and agrees to view in MyChart. Active MyChart status and patient understanding of how to access instructions and care plan via MyChart confirmed with patient.     The patient has been provided with contact information for the care management team and has been advised to call with any health related questions or concerns.   Odella Ku, RN, MSN, CCM [facility], [facility] RN Care Coordinator Direct Dial: [phone] / Main [phone] Fax [phone] Email: [email or web address]: [email or web address]

## 2023-12-13 NOTE — Progress Notes (Addendum)
 BP 126/88 (BP Location: Left Arm, Patient Position: Sitting, Cuff Size: Large)   Pulse 85   Temp 97.6 F (36.4 C) (Oral)   Ht 5' 9 (1.753 m)   Wt 143 lb 6.4 oz (65 kg)   SpO2 99%   BMI 21.18 kg/m    Subjective:    Patient ID: Ronnie Bullock, male    DOB: April 21, 1946, 78 y.o.   MRN: [id number]  HPI: Ronnie Bullock is a 78 y.o. male  Chief Complaint  Patient presents with  . Nasal Congestion    Has been a problem since last week   UPPER RESPIRATORY TRACT INFECTION Worst symptom: congestion.  Has been ongoing since last week. Fever: no Cough: no Shortness of breath: no Wheezing: no Chest pain: yes, with cough Chest tightness: no Chest congestion: no Nasal congestion: no Runny nose: yes Post nasal drip: no Sneezing: no Sore throat: no Swollen glands: no Sinus pressure: no Headache: no Face pain: no Toothache: no Ear pain: no bilateral Ear pressure: no bilateral Eyes red/itching:no Eye drainage/crusting: no  Vomiting: no Rash: no Fatigue: yes Sick contacts: no Strep contacts: no  Context: worse Recurrent sinusitis: no Relief with OTC cold/cough medications: no  Treatments attempted: none   Relevant past medical, surgical, family and social history reviewed and updated as indicated. Interim medical history since our last visit reviewed. Allergies and medications reviewed and updated.  Review of Systems  Constitutional:  Positive for fatigue. Negative for fever.  HENT:  Positive for congestion and rhinorrhea. Negative for ear pain, postnasal drip, sinus pressure, sinus pain, sneezing and sore throat.   Respiratory:  Negative for cough, chest tightness, shortness of breath and wheezing.   Gastrointestinal:  Negative for vomiting.  Skin:  Negative for rash.  Neurological:  Negative for headaches.    Per HPI unless specifically indicated above     Objective:    BP 126/88 (BP Location: Left Arm, Patient Position: Sitting, Cuff Size: Large)   Pulse  85   Temp 97.6 F (36.4 C) (Oral)   Ht 5' 9 (1.753 m)   Wt 143 lb 6.4 oz (65 kg)   SpO2 99%   BMI 21.18 kg/m   Wt Readings from Last 3 Encounters:  12/13/23 143 lb 6.4 oz (65 kg)  08/09/23 153 lb (69.4 kg)  06/26/23 156 lb 12.8 oz (71.1 kg)    Physical Exam Vitals and nursing note reviewed.  Constitutional:      General: He is not in acute distress.    Appearance: Normal appearance. He is not ill-appearing, toxic-appearing or diaphoretic.  HENT:     Head: Normocephalic.     Right Ear: External ear normal.     Left Ear: Tympanic membrane and external ear normal.     Nose: Congestion and rhinorrhea present.     Mouth/Throat:     Mouth: Mucous membranes are moist.     Pharynx: Posterior oropharyngeal erythema present. No oropharyngeal exudate.  Eyes:     General:        Right eye: No discharge.        Left eye: No discharge.     Extraocular Movements: Extraocular movements intact.     Conjunctiva/sclera: Conjunctivae normal.     Pupils: Pupils are equal, round, and reactive to light.  Cardiovascular:     Rate and Rhythm: Normal rate and regular rhythm.     Heart sounds: No murmur heard. Pulmonary:     Effort: Pulmonary effort is normal. No respiratory  distress.     Breath sounds: Normal breath sounds. No wheezing, rhonchi or rales.  Abdominal:     General: Abdomen is flat. Bowel sounds are normal.  Musculoskeletal:     Cervical back: Normal range of motion and neck supple.  Skin:    General: Skin is warm and dry.     Capillary Refill: Capillary refill takes less than 2 seconds.  Neurological:     General: No focal deficit present.     Mental Status: He is alert and oriented to person, place, and time.  Psychiatric:        Mood and Affect: Mood normal.        Behavior: Behavior normal.        Thought Content: Thought content normal.        Judgment: Judgment normal.    Results for orders placed or performed in visit on 11/06/23  CULTURE, URINE COMPREHENSIVE    Collection Time: 11/06/23 10:56 AM   Specimen: Urine   UR  Result Value Ref Range   Urine Culture, Comprehensive Final report    Organism ID, Bacteria Comment   Microscopic Examination   Collection Time: 11/06/23 10:56 AM   Urine  Result Value Ref Range   WBC, UA 0-5 0 - 5 /hpf   RBC, Urine 0-2 0 - 2 /hpf   Epithelial Cells (non renal) 0-10 0 - 10 /hpf   Casts Present (A) None seen /lpf   Cast Type Hyaline casts N/A   Mucus, UA Present (A) Not Estab.   Bacteria, UA Few None seen/Few  Urinalysis, Complete   Collection Time: 11/06/23 10:56 AM  Result Value Ref Range   Specific Gravity, UA >1.030 (H) 1.005 - 1.030   pH, UA 5.5 5.0 - 7.5   Color, UA Yellow Yellow   Appearance Ur Clear Clear   Leukocytes,UA Negative Negative   Protein,UA 3+ (A) Negative/Trace   Glucose, UA Negative Negative   Ketones, UA Negative Negative   RBC, UA Trace (A) Negative   Bilirubin, UA Negative Negative   Urobilinogen, Ur 1.0 0.2 - 1.0 mg/dL   Nitrite, UA Negative Negative   Microscopic Examination See below:   BLADDER SCAN AMB NON-IMAGING   Collection Time: 11/06/23 11:08 AM  Result Value Ref Range   Scan Result 90ml       Assessment & Plan:   Problem List Items Addressed This Visit   None Visit Diagnoses       Viral upper respiratory tract infection    -  Primary   Symptoms x 1 week. Recommend flonase  and zyrtec .  Follow up if not improved.        Follow up plan: No follow-ups on file.

## 2023-12-13 NOTE — Patient Outreach (Signed)
  Care Coordination   Follow Up Visit Note   12/13/2023 Name: Ronnie Bullock MRN: [id number] DOB: [birth year]  Ronnie Bullock Brighter is a 78 y.o. year old male who sees Vicci Duwaine SQUIBB, DO for primary care. I spoke with sister of Ronnie Bullock by phone today.  What matters to the patients health and wellness today?  Per sister, patient had a runny nose at the day center, seen by PCP today, advised to take Flonase  and Zyrtec .  Otherwise doing well.  Denies any urgent concerns, encouraged to contact this care manager with questions.     Goals Addressed             This Visit's Progress    Management of chronic medical conditions   On track    Interventions Today    Flowsheet Row Most Recent Value  Chronic Disease   Chronic disease during today's visit Hypertension (HTN), Other  [dementia]  General Interventions   General Interventions Discussed/Reviewed General Interventions Reviewed, Doctor Visits  Doctor Visits Discussed/Reviewed Doctor Visits Reviewed, PCP  [Reviewed upcoming: PCP 3/5]  PCP/Specialist Visits Compliance with follow-up visit  [seen by PCP today for sinus issues (runny nose, mucus drainage)]  Education Interventions   Education Provided Provided Education  Provided Verbal Education On Medication, When to see the doctor  [Medications reviewed, recommended to use FLonase  and zyrtec  for allergies/upper respiratory issues]              SDOH assessments and interventions completed:  No     Care Coordination Interventions:  Yes, provided   Follow up plan: Follow up call scheduled for 5/5    Encounter Outcome:  Patient Visit Completed   Odella Ku, RN, MSN, CCM [place]  [facility], [facility] RN Care Coordinator Direct Dial: [phone] / Main [phone] Fax [phone] Email: [email or web address]

## 2024-01-10 ENCOUNTER — Ambulatory Visit: Payer: Medicare HMO | Admitting: Family Medicine

## 2024-02-07 ENCOUNTER — Other Ambulatory Visit: Payer: Self-pay | Admitting: Physician Assistant

## 2024-02-08 ENCOUNTER — Telehealth: Payer: Self-pay

## 2024-02-08 NOTE — Telephone Encounter (Signed)
 Copied from CRM [id number]. Topic: Clinical - Prescription Issue >> Feb 08, 2024  3:46 PM Victorino Dike T wrote: Reason for CRM: amLODipine (NORVASC) 5 MG tablet and donepezil (ARICEPT) 5 MG tablet- pharmacy still waiting for these two refill orders to be able to complete the patients package

## 2024-02-08 NOTE — Telephone Encounter (Signed)
 Refill request encounter was pending with the E2C2 pool. I have gone ahead and pulled that encounter and forwarded directly to PCP for review.

## 2024-02-09 NOTE — Telephone Encounter (Signed)
 Requested Prescriptions  Pending Prescriptions Disp Refills   donepezil (ARICEPT) 5 MG tablet [Pharmacy Med Name: DONEPEZIL HCL 5 MG TAB] 90 tablet 0    Sig: TAKE 1 TABLET BY MOUTH AT BEDTIME     Neurology:  Alzheimer's Agents Passed - 02/09/2024  8:16 AM      Passed - Valid encounter within last 6 months    Recent Outpatient Visits           1 month ago Viral upper respiratory tract infection   [place] [facility] Larae Grooms, NP       Future Appointments             In 1 week Laural Benes, Oralia Rud, DO [place] [facility]   In 2 months Stoioff, Verna Czech, MD [facility] [place]             amLODipine (NORVASC) 5 MG tablet [Pharmacy Med Name: AMLODIPINE BESYLATE 5 MG TAB] 90 tablet 0    Sig: TAKE 1 TABLET BY MOUTH ONCE EVERY MORNING     Cardiovascular: Calcium Channel Blockers 2 Passed - 02/09/2024  8:16 AM      Passed - Last BP in normal range    BP Readings from Last 1 Encounters:  12/13/23 126/88         Passed - Last Heart Rate in normal range    Pulse Readings from Last 1 Encounters:  12/13/23 85         Passed - Valid encounter within last 6 months    Recent Outpatient Visits           1 month ago Viral upper respiratory tract infection   [place] [facility] Larae Grooms, NP       Future Appointments             In 1 week Laural Benes, Oralia Rud, DO [place] [facility], [facility]   In 2 months Stoioff, Verna Czech, MD [facility] [place]

## 2024-02-22 ENCOUNTER — Ambulatory Visit: Admitting: Family Medicine

## 2024-03-11 ENCOUNTER — Encounter: Payer: Self-pay | Admitting: *Deleted

## 2024-03-11 ENCOUNTER — Encounter: Payer: Medicare HMO | Admitting: *Deleted

## 2024-03-21 ENCOUNTER — Observation Stay

## 2024-03-21 ENCOUNTER — Other Ambulatory Visit: Payer: Self-pay

## 2024-03-21 ENCOUNTER — Observation Stay
Admission: EM | Admit: 2024-03-21 | Discharge: 2024-03-31 | Disposition: A | Discharge status: Skilled Nursing Facility | Attending: Internal Medicine | Admitting: Internal Medicine

## 2024-03-21 ENCOUNTER — Emergency Department

## 2024-03-21 DIAGNOSIS — R29818 Other symptoms and signs involving the nervous system: Secondary | ICD-10-CM | POA: Diagnosis present

## 2024-03-21 DIAGNOSIS — Z8546 Personal history of malignant neoplasm of prostate: Secondary | ICD-10-CM | POA: Diagnosis not present

## 2024-03-21 DIAGNOSIS — F0390 Unspecified dementia without behavioral disturbance: Secondary | ICD-10-CM | POA: Diagnosis present

## 2024-03-21 DIAGNOSIS — I10 Essential (primary) hypertension: Secondary | ICD-10-CM | POA: Diagnosis present

## 2024-03-21 DIAGNOSIS — Z7901 Long term (current) use of anticoagulants: Secondary | ICD-10-CM

## 2024-03-21 DIAGNOSIS — I63542 Cerebral infarction due to unspecified occlusion or stenosis of left cerebellar artery: Principal | ICD-10-CM | POA: Insufficient documentation

## 2024-03-21 DIAGNOSIS — G932 Benign intracranial hypertension: Secondary | ICD-10-CM | POA: Diagnosis not present

## 2024-03-21 DIAGNOSIS — R200 Anesthesia of skin: Secondary | ICD-10-CM

## 2024-03-21 DIAGNOSIS — L03031 Cellulitis of right toe: Secondary | ICD-10-CM | POA: Diagnosis not present

## 2024-03-21 DIAGNOSIS — Z79899 Other long term (current) drug therapy: Secondary | ICD-10-CM | POA: Insufficient documentation

## 2024-03-21 DIAGNOSIS — R531 Weakness: Principal | ICD-10-CM

## 2024-03-21 DIAGNOSIS — I639 Cerebral infarction, unspecified: Secondary | ICD-10-CM | POA: Diagnosis not present

## 2024-03-21 DIAGNOSIS — I6523 Occlusion and stenosis of bilateral carotid arteries: Secondary | ICD-10-CM | POA: Insufficient documentation

## 2024-03-21 DIAGNOSIS — Z86718 Personal history of other venous thrombosis and embolism: Secondary | ICD-10-CM

## 2024-03-21 DIAGNOSIS — R202 Paresthesia of skin: Secondary | ICD-10-CM | POA: Diagnosis present

## 2024-03-21 HISTORY — DX: Long term (current) use of anticoagulants: Z79.01

## 2024-03-21 LAB — COMPREHENSIVE METABOLIC PANEL WITH GFR
ALT: 10 U/L (ref 0–44)
AST: 20 U/L (ref 15–41)
Albumin: 3.1 g/dL — ABNORMAL LOW (ref 3.5–5.0)
Alkaline Phosphatase: 68 U/L (ref 38–126)
Anion gap: 9 (ref 5–15)
BUN: 19 mg/dL (ref 8–23)
CO2: 23 mmol/L (ref 22–32)
Calcium: 8.9 mg/dL (ref 8.9–10.3)
Chloride: 105 mmol/L (ref 98–111)
Creatinine, Ser: 1.15 mg/dL (ref 0.61–1.24)
GFR, Estimated: >60 mL/min (ref >60)
Glucose, Bld: 141 mg/dL — ABNORMAL HIGH (ref 70–99)
Potassium: 4 mmol/L (ref 3.5–5.1)
Sodium: 137 mmol/L (ref 135–145)
Total Bilirubin: 0.8 mg/dL (ref 0.0–1.2)
Total Protein: 7.5 g/dL (ref 6.5–8.1)

## 2024-03-21 LAB — CBC
HCT: 39.6 % (ref 39.0–52.0)
Hemoglobin: 13 g/dL (ref 13.0–17.0)
MCH: 30 pg (ref 26.0–34.0)
MCHC: 32.8 g/dL (ref 30.0–36.0)
MCV: 91.2 fL (ref 80.0–100.0)
Platelets: 210 10*3/uL (ref 150–400)
RBC: 4.34 MIL/uL (ref 4.22–5.81)
RDW: 14.5 % (ref 11.5–15.5)
WBC: 5 10*3/uL (ref 4.0–10.5)
nRBC: 0 % (ref 0.0–0.2)

## 2024-03-21 LAB — DIFFERENTIAL
Abs Immature Granulocytes: 0.01 10*3/uL (ref 0.00–0.07)
Basophils Absolute: 0 10*3/uL (ref 0.0–0.1)
Basophils Relative: 1 %
Eosinophils Absolute: 0.5 10*3/uL (ref 0.0–0.5)
Eosinophils Relative: 10 %
Immature Granulocytes: 0 %
Lymphocytes Relative: 30 %
Lymphs Abs: 1.5 10*3/uL (ref 0.7–4.0)
Monocytes Absolute: 0.6 10*3/uL (ref 0.1–1.0)
Monocytes Relative: 12 %
Neutro Abs: 2.4 10*3/uL (ref 1.7–7.7)
Neutrophils Relative %: 47 %

## 2024-03-21 LAB — PROTIME-INR
INR: 1.1 (ref 0.8–1.2)
Prothrombin Time: 14.4 s (ref 11.4–15.2)

## 2024-03-21 LAB — ETHANOL: Alcohol, Ethyl (B): <15 mg/dL (ref <15)

## 2024-03-21 LAB — APTT: aPTT: 28 s (ref 24–36)

## 2024-03-21 MED ORDER — ACETAMINOPHEN 160 MG/5ML PO SOLN: 650.0000 mg | ORAL | Status: DC | PRN

## 2024-03-21 MED ORDER — DONEPEZIL HCL 5 MG PO TABS
5.0000 mg | ORAL_TABLET | Freq: Every day | ORAL | Status: DC
  Administered 2024-03-21 – 2024-03-30 (×10): 5 mg via ORAL
  Filled 2024-03-21 (×10): qty 1

## 2024-03-21 MED ORDER — STROKE: EARLY STAGES OF RECOVERY BOOK: Freq: Once | Status: DC

## 2024-03-21 MED ORDER — ACETAMINOPHEN 650 MG RE SUPP: 650.0000 mg | RECTAL | Status: DC | PRN

## 2024-03-21 MED ORDER — APIXABAN 5 MG PO TABS
5.0000 mg | ORAL_TABLET | Freq: Two times a day (BID) | ORAL | Status: DC
  Administered 2024-03-21 – 2024-03-31 (×20): 5 mg via ORAL
  Filled 2024-03-21 (×20): qty 1

## 2024-03-21 MED ORDER — ENOXAPARIN SODIUM 40 MG/0.4ML IJ SOSY: 40.0000 mg | PREFILLED_SYRINGE | INTRAMUSCULAR | Status: DC

## 2024-03-21 MED ORDER — TAMSULOSIN HCL 0.4 MG PO CAPS
0.8000 mg | ORAL_CAPSULE | Freq: Every day | ORAL | Status: DC
  Administered 2024-03-21 – 2024-03-31 (×11): 0.8 mg via ORAL
  Filled 2024-03-21 (×11): qty 2

## 2024-03-21 MED ORDER — ACETAMINOPHEN 325 MG PO TABS
650.0000 mg | ORAL_TABLET | ORAL | Status: DC | PRN
  Administered 2024-03-21: 650 mg via ORAL
  Filled 2024-03-21: qty 2

## 2024-03-21 NOTE — Assessment & Plan Note (Addendum)
-   Delirium precautions - Continue home Aricept

## 2024-03-21 NOTE — ED Provider Notes (Signed)
 [facility] Provider Note    Event Date/Time   First MD Initiated Contact with Patient 03/21/24 1802     (approximate)   History   Numbness   HPI ABDULRAHIM MANOS is a 78 y.o. male with history of dementia, HTN presenting today for numbness.  Patient states when he woke up yesterday morning around 6 AM he noticed numbness and weakness in his right lower extremity.  He has reportedly had 6 falls since this has happened with no similar instances recently.  He notes diminished sensation to me throughout his entire right lower extremity and right upper extremity.  No speech changes or vision changes.  Did not hit his head on any of the falls as staff members have caught him.  No prior history of strokes.  No other acute symptoms today outside the weakness and numbness.  Collateral history obtained by sister.  He has dementia but is not often disoriented from his baseline.  Notes of 6 falls that have all been due to weakness in his right lower extremity.     Physical Exam   Triage Vital Signs: ED Triage Vitals  Encounter Vitals Group     BP 03/21/24 1646 (!) 140/104     Systolic BP Percentile --      Diastolic BP Percentile --      Pulse Rate 03/21/24 1646 95     Resp 03/21/24 1646 17     Temp 03/21/24 1646 99.2 F (37.3 C)     Temp Source 03/21/24 1646 Oral     SpO2 03/21/24 1646 95 %     Weight 03/21/24 1647 150 lb (68 kg)     Height 03/21/24 1647 5\' 9"  (1.753 m)     Head Circumference --      Peak Flow --      Pain Score 03/21/24 1649 0     Pain Loc --      Pain Education --      Exclude from Growth Chart --     Most recent vital signs: Vitals:   03/21/24 1646  BP: (!) 140/104  Pulse: 95  Resp: 17  Temp: 99.2 F (37.3 C)  SpO2: 95%   Physical Exam: I have reviewed the vital signs and nursing notes. General: Awake, alert, no acute distress.  Nontoxic appearing. Head:  Atraumatic, normocephalic.   ENT:  EOM intact, PERRL. Oral mucosa is  pink and moist with no lesions. Neck: Neck is supple with full range of motion, No meningeal signs. Cardiovascular:  RRR, No murmurs. Peripheral pulses palpable and equal bilaterally. Respiratory:  Symmetrical chest wall expansion.  No rhonchi, rales, or wheezes.  Good air movement throughout.  No use of accessory muscles.   Musculoskeletal:  No cyanosis or edema. Moving extremities with full ROM Abdomen:  Soft, nontender, nondistended. Neuro:  GCS 15, moving all four extremities, interacting appropriately. Speech clear.  Cranial nerves II through XII intact with no facial droop or decreased sensation.  4/5 strength with tricep extension on the right upper extremity.  5 out of 5 strength throughout left upper extremity.  4/5 strength globally throughout the right lower extremity with 5/5 strength throughout left lower extremity.  Sensation diminished throughout right sided extremities but not complete numbness in comparison to left-sided extremities. Psych:  Calm, appropriate.   Skin:  Warm, dry, no rash.      ED Results / Procedures / Treatments   Labs (all labs ordered are listed, but only abnormal results  are displayed) Labs Reviewed  COMPREHENSIVE METABOLIC PANEL WITH GFR - Abnormal; Notable for the following components:      Result Value   Glucose, Bld 141 (*)    Albumin 3.1 (*)    All other components within normal limits  PROTIME-INR  APTT  CBC  DIFFERENTIAL  ETHANOL  CBG MONITORING, ED     EKG My EKG interpretation: Rate of 82, normal sinus rhythm, normal axis, normal intervals.  No acute ST elevations or depressions   RADIOLOGY Independent interpreted CT head with no acute pathology   PROCEDURES:  Critical Care performed: No  Procedures   MEDICATIONS ORDERED IN ED: Medications - No data to display   IMPRESSION / MDM / ASSESSMENT AND PLAN / ED COURSE  I reviewed the triage vital signs and the nursing notes.                              Differential  diagnosis includes, but is not limited to, CVA, electrolyte abnormality, deconditioning, dehydration  Patient's presentation is most consistent with acute presentation with potential threat to life or bodily function.  Patient is a 78 year old male presenting today for new onset weakness and diminished sensation throughout his right side extremities.  On exam, he does not have complete numbness but does have diminished sensation throughout the right sided extremities and compared to the left side.  He also has weakness with hip flexion and plantarflexion most notably on his right lower extremity with no such weakness on the left side.  Also has weakness with tricep extension on the right upper extremity in comparison to the left side.  Cranial nerves II through XII otherwise intact.  Laboratory workup largely reassuring with stable vital signs.  CT head shows no acute pathology.  However, given his ongoing numbness and paresthesias throughout his entire right sided weaknesses which have resulted in multiple falls, will need MRI and additional stroke workup.  Along with PT/OT.  Patient admitted to hospitalist for further care.  The patient is on the cardiac monitor to evaluate for evidence of arrhythmia and/or significant heart rate changes.     FINAL CLINICAL IMPRESSION(S) / ED DIAGNOSES   Final diagnoses:  Right sided weakness  Right sided numbness     Rx / DC Orders   ED Discharge Orders     None        Note:  This document was prepared using Dragon voice recognition software and may include unintentional dictation errors.   Kandee Orion, MD 03/21/24 [phone]

## 2024-03-21 NOTE — ED Notes (Signed)
 Pt brief changed, peri care completed, pt placed in gown and clothes given to family in belonging bag, non slip socks on pt, family notified to inform when leaving to place pt on bed alarm

## 2024-03-21 NOTE — Assessment & Plan Note (Signed)
Hold home antihypertensives for permissive hypertension

## 2024-03-21 NOTE — Assessment & Plan Note (Signed)
 Possible CVA Permissive hypertension for first 24-48 hrs post stroke onset: Prn Labetalol IV or Vasotec IV If BP greater than 220/120  Statins for LDL goal less than 70 Continue Eliquis .  Will hold off on aspirin and Plavix to decrease bleeding risk ,pending neurology recommendations Telemetry, echo, MRI Avoid dextrose  containing fluids, Maintain euglycemia, euthermia Neuro checks q4 hrs x 24 hrs and then per shift Head of bed 30 degrees Physical therapy/Occupational therapy/Speech therapy if failed dysphagia screen Neurology consult to follow

## 2024-03-21 NOTE — H&P (Signed)
 History and Physical    Patient: Ronnie Bullock:[id number] DOB: [birth year] DOA: 03/21/2024 DOS: the patient was seen and examined on 03/21/2024 PCP: [name], DO  Patient coming from: Home  Chief Complaint:  Chief Complaint  Patient presents with   Numbness    HPI: Ronnie Bullock is a 78 y.o. 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  He had no injury related to the falls.  Has a prior history of a fall in 2022 . 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      Past Medical History:  Diagnosis Date   Dementia (HCC)    Dementia (HCC)    Hyperlipidemia    Hypertension    Incontinence    Past Surgical History:  Procedure Laterality Date   COLONOSCOPY WITH PROPOFOL  N/A 05/16/2018   Procedure: COLONOSCOPY WITH PROPOFOL ;  Surgeon: Irby Mannan, MD;  Location: [facility] ENDOSCOPY;  Service: Endoscopy;  Laterality: N/A;   PERIPHERAL VASCULAR THROMBECTOMY Left 01/22/2021   Procedure: PERIPHERAL VASCULAR THROMBECTOMY / THROMBOLYSIS;  Surgeon: Jackquelyn Mass, MD;  Location: [facility]IVE CV [facility];  Service: Cardiovascular;  Laterality: Left;   Social History:  reports that he has never smoked. He has never been exposed to tobacco smoke. He has never used smokeless tobacco. He reports that he does not drink alcohol and does not use  drugs.  No Known Allergies  Family History  Problem Relation Age of Onset   Hypertension Mother    Prostate cancer Neg Hx    Bladder Cancer Neg Hx    Kidney cancer Neg Hx     Prior to Admission medications   Medication Sig Start Date End Date Taking? Authorizing Provider  amLODipine  (NORVASC ) 5 MG tablet TAKE 1 TABLET BY MOUTH ONCE EVERY MORNING 02/09/24   Johnson, Megan P, DO  apixaban  (ELIQUIS ) 5 MG TABS tablet Take 1 tablet (5 mg total) by mouth 2 (two) times daily. 08/09/23   Johnson, Megan P, DO  cetirizine  (ZYRTEC ) 10 MG tablet Take 1 tablet (10 mg total) by mouth daily. 12/13/23   Aileen Alexanders, NP  donepezil  (ARICEPT ) 5 MG tablet TAKE 1 TABLET BY MOUTH AT BEDTIME 02/09/24   Johnson, Megan P, DO  fluticasone  (FLONASE ) 50 MCG/ACT nasal spray USE 2 SPRAYS INTO EACH NOSTRIL DAILY 12/07/23   Terre Ferri P, DO  Multiple Vitamin (MULTIVITAMIN WITH MINERALS) TABS tablet Take 1 tablet by mouth daily. 07/07/23   Cannady, Jolene T, NP  tamsulosin  (FLOMAX ) 0.4 MG CAPS capsule TAKE 2 CAPSULES BY MOUTH ONCE DAILY AT BEDTIME 11/09/23   Johnson, Megan P, DO  tamsulosin  (FLOMAX ) 0.4 MG CAPS capsule Take 2 capsules (0.8 mg total) by mouth daily. 11/06/23   Erman Hayward, MD  triamcinolone  cream (KENALOG ) 0.1 % Apply 1 Application topically 2 (two) times daily. 07/07/23   Lemar Pyles, NP    Physical Exam: Vitals:  03/21/24 1646 03/21/24 1647 03/21/24 1649 03/21/24 2034  BP: (!) 140/104   (!) 158/111  Pulse: 95   78  Resp: 17   19  Temp: 99.2 F (37.3 C)   97.8 F (36.6 C)  TempSrc: Oral   Oral  SpO2: 95%   99%  Weight:  68 kg 68 kg   Height:  5\' 9"  (1.753 m) 5\' 6"  (1.676 m)    Physical Exam  Labs on Admission: I have personally reviewed following labs and imaging studies  CBC: Recent Labs  Lab 03/21/24 1651  WBC 5.0  NEUTROABS 2.4  HGB 13.0  HCT 39.6  MCV 91.2  PLT 210   Basic Metabolic Panel: Recent Labs  Lab 03/21/24 1651  NA 137  K 4.0  CL 105  CO2 23   GLUCOSE 141*  BUN 19  CREATININE 1.15  CALCIUM  8.9   GFR: Estimated Creatinine Clearance: 47 mL/min (by C-G formula based on SCr of 1.15 mg/dL). Liver Function Tests: Recent Labs  Lab 03/21/24 1651  AST 20  ALT 10  ALKPHOS 68  BILITOT 0.8  PROT 7.5  ALBUMIN 3.1*   No results for input(s): "LIPASE", "AMYLASE" in the last 168 hours. No results for input(s): "AMMONIA" in the last 168 hours. Coagulation Profile: Recent Labs  Lab 03/21/24 1651  INR 1.1   Cardiac Enzymes: No results for input(s): "CKTOTAL", "CKMB", "CKMBINDEX", "TROPONINI" in the last 168 hours. BNP (last 3 results) No results for input(s): "PROBNP" in the last 8760 hours. HbA1C: No results for input(s): "HGBA1C" in the last 72 hours. CBG: No results for input(s): "GLUCAP" in the last 168 hours. Lipid Profile: No results for input(s): "CHOL", "HDL", "LDLCALC", "TRIG", "CHOLHDL", "LDLDIRECT" in the last 72 hours. Thyroid  Function Tests: No results for input(s): "TSH", "T4TOTAL", "FREET4", "T3FREE", "THYROIDAB" in the last 72 hours. Anemia Panel: No results for input(s): "VITAMINB12", "FOLATE", "FERRITIN", "TIBC", "IRON", "RETICCTPCT" in the last 72 hours. Urine analysis:    Component Value Date/Time   COLORURINE YELLOW (A) 02/12/2021 1658   APPEARANCEUR Clear 11/06/2023 1056   LABSPEC 1.017 02/12/2021 1658   PHURINE 5.0 02/12/2021 1658   GLUCOSEU Negative 11/06/2023 1056   HGBUR NEGATIVE 02/12/2021 1658   BILIRUBINUR Negative 11/06/2023 1056   KETONESUR NEGATIVE 02/12/2021 1658   PROTEINUR 3+ (A) 11/06/2023 1056   PROTEINUR 100 (A) 02/12/2021 1658   UROBILINOGEN 0.2 10/30/2020 1403   NITRITE Negative 11/06/2023 1056   NITRITE NEGATIVE 02/12/2021 1658   LEUKOCYTESUR Negative 11/06/2023 1056   LEUKOCYTESUR NEGATIVE 02/12/2021 1658    Radiological Exams on Admission: CT HEAD WO CONTRAST Result Date: 03/21/2024 CLINICAL DATA:  Right leg numbness EXAM: CT HEAD WITHOUT CONTRAST TECHNIQUE: Contiguous  axial images were obtained from the base of the skull through the vertex without intravenous contrast. RADIATION DOSE REDUCTION: This exam was performed according to the departmental dose-optimization program which includes automated exposure control, adjustment of the mA and/or kV according to patient size and/or use of iterative reconstruction technique. COMPARISON:  CT 02/12/2021 FINDINGS: Brain: No acute territorial infarction, hemorrhage or intracranial mass. Atrophy and moderate severe chronic small vessel ischemic changes of the white matter. Stable ventricle size and morphology. 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  Carotid vascular calcification Skull: No fracture Sinuses/Orbits: Moderate mucosal thickening in the maxillary sinuses, ethmoid sinuses and right frontal sinus. Other: None IMPRESSION: 1. No CT evidence for acute intracranial abnormality. 2. Atrophy and chronic small vessel ischemic changes of the white matter. 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 Electronically Signed   By: Esmeralda Hedge M.D.   On: 03/21/2024 18:27   Data Reviewed for HPI: Relevant notes from primary care and specialist visits, past discharge summaries as available in EHR, including Care Everywhere. Prior diagnostic testing as pertinent to current admission diagnoses Updated medications and problem lists for reconciliation ED course, including vitals, labs, imaging, treatment and response to treatment Triage notes, nursing and pharmacy notes and ED provider's notes Notable results as noted above in HPI      Assessment and Plan: * Focal neurological deficit, right lower extremity numbness Possible CVA Permissive hypertension for first 24-48 hrs post stroke onset: Prn Labetalol IV or Vasotec IV If BP greater than 220/120  Statins for LDL goal less than 70 Continue Eliquis .  Will hold off on aspirin and Plavix to decrease bleeding risk ,pending neurology  recommendations Telemetry, echo, MRI Avoid dextrose  containing fluids, Maintain euglycemia, euthermia Neuro checks q4 hrs x 24 hrs and then per shift Head of bed 30 degrees Physical therapy/Occupational therapy/Speech therapy if failed dysphagia screen Neurology consult to follow   Essential hypertension, benign Hold home antihypertensives for permissive hypertension  Chronic anticoagulation History of DVT LLE 2022 Continue Eliquis   History of prostate cancer No acute issues suspected at this time  Dementia without behavioral disturbance (HCC) Delirium precautions Continue home Aricept         DVT prophylaxis: Apixaban   Consults: Neurology  Advance Care Planning:   Code Status: Full Code   Family Communication: none***  Disposition Plan: Back to previous home environment  Severity of Illness: The appropriate patient status for this patient is OBSERVATION. Observation status is judged to be reasonable and necessary in order to provide the required intensity of service to ensure the patient's safety. The patient's presenting symptoms, physical exam findings, and initial radiographic and laboratory data in the context of their medical condition is felt to place them at decreased risk for further clinical deterioration. Furthermore, it is anticipated that the patient will be medically stable for discharge from the hospital within 2 midnights of admission.   Author: Lanetta Pion, MD 03/21/2024 8:52 PM  For on call review [email or web address].

## 2024-03-21 NOTE — Assessment & Plan Note (Signed)
 History of DVT LLE 2022 Continue Eliquis 

## 2024-03-21 NOTE — Hospital Course (Signed)
 Ronnie Bullock

## 2024-03-21 NOTE — ED Notes (Signed)
 CCMD monitoring patient

## 2024-03-21 NOTE — ED Triage Notes (Signed)
 Patient states right leg numbness since 0600 yesterday morning; denies all other symptoms.

## 2024-03-21 NOTE — Assessment & Plan Note (Addendum)
No acute issues suspected at this time

## 2024-03-22 ENCOUNTER — Observation Stay
Admit: 2024-03-22 | Discharge: 2024-03-22 | Disposition: A | Discharge status: Home / Self Care | Attending: Internal Medicine | Admitting: Internal Medicine

## 2024-03-22 DIAGNOSIS — Z7901 Long term (current) use of anticoagulants: Secondary | ICD-10-CM | POA: Diagnosis not present

## 2024-03-22 DIAGNOSIS — I6389 Other cerebral infarction: Secondary | ICD-10-CM

## 2024-03-22 DIAGNOSIS — I639 Cerebral infarction, unspecified: Secondary | ICD-10-CM

## 2024-03-22 HISTORY — DX: Cerebral infarction, unspecified: I63.9

## 2024-03-22 LAB — LIPID PANEL
Cholesterol: 136 mg/dL (ref 0–200)
HDL: 50 mg/dL (ref >40)
LDL Cholesterol: 78 mg/dL (ref 0–99)
Total CHOL/HDL Ratio: 2.7 ratio
Triglycerides: 42 mg/dL (ref <150)
VLDL: 8 mg/dL (ref 0–40)

## 2024-03-22 LAB — HEMOGLOBIN A1C
Hgb A1c MFr Bld: 6.3 % — ABNORMAL HIGH (ref 4.8–5.6)
Mean Plasma Glucose: 134.11 mg/dL

## 2024-03-22 NOTE — Progress Notes (Signed)
 Echocardiogram 2D Echocardiogram has been performed.  Ronnie Bullock 03/22/2024, 7:52 PM

## 2024-03-22 NOTE — Consult Note (Signed)
 NEUROLOGY CONSULT NOTE   Date of service: Mar 22, 2024 Patient Name: Ronnie Bullock MRN:  [id number] DOB:  01/03/1946 Chief Complaint: Right sided weakness Requesting Provider: Melodi Sprung, DO  History of Present Illness  [name] is a 78 y.o. male with hx of dementia, HLD, DVT and HTN who presented to the hospital yesterday with right leg numbness and weakness. Symptoms began at 0600 the day before. He reportedly had 6 falls since this has happened, due to the leg weakness. He endorsed diminished sensation throughout his entire right lower extremity and right upper extremity. He did not experience any speech changes or vision changes. He did not hit his head with any of the falls as staff members had caught him.   Initial CT head showed no acute abnormality. Patient is already on Eliquis  for prior DVT.    ROS  As per HPI. Unable to obtain a comprehensive ROS due to dementia.   Past History   Past Medical History:  Diagnosis Date   Dementia (HCC)    Dementia (HCC)    Hyperlipidemia    Hypertension    Incontinence     Past Surgical History:  Procedure Laterality Date   COLONOSCOPY WITH PROPOFOL  N/A 05/16/2018   Procedure: COLONOSCOPY WITH PROPOFOL ;  Surgeon: Irby Mannan, MD;  Location: [facility] ENDOSCOPY;  Service: Endoscopy;  Laterality: N/A;   PERIPHERAL VASCULAR THROMBECTOMY Left 01/22/2021   Procedure: PERIPHERAL VASCULAR THROMBECTOMY / THROMBOLYSIS;  Surgeon: Jackquelyn Mass, MD;  Location: [facility];  Service: Cardiovascular;  Laterality: Left;    Family History: Family History  Problem Relation Age of Onset   Hypertension Mother    Prostate cancer Neg Hx    Bladder Cancer Neg Hx    Kidney cancer Neg Hx     Social History  reports that he has never smoked. He has never been exposed to tobacco smoke. He has never used smokeless tobacco. He reports that he does not drink alcohol and does not use drugs.  No Known  Allergies  Medications   Current Facility-Administered Medications:     stroke: early stages of recovery book, , Does not apply, Once, Lanetta Pion, MD   acetaminophen  (TYLENOL ) tablet 650 mg, 650 mg, Oral, Q4H PRN, 650 mg at 03/21/24 2145 **OR** acetaminophen  (TYLENOL ) 160 MG/5ML solution 650 mg, 650 mg, Per Tube, Q4H PRN **OR** acetaminophen  (TYLENOL ) suppository 650 mg, 650 mg, Rectal, Q4H PRN, Lanetta Pion, MD   apixaban  (ELIQUIS ) tablet 5 mg, 5 mg, Oral, BID, Brion Cancel V, MD, 5 mg at 03/22/24 4098   donepezil  (ARICEPT ) tablet 5 mg, 5 mg, Oral, QHS, Brion Cancel V, MD, 5 mg at 03/21/24 2142   tamsulosin  (FLOMAX ) capsule 0.8 mg, 0.8 mg, Oral, Daily, Brion Cancel V, MD, 0.8 mg at 03/22/24 0858  Vitals   Vitals:   03/22/24 0830 03/22/24 0835 03/22/24 0900 03/22/24 1324  BP: (!) 138/94  (!) 137/105 129/83  Pulse: 84 85  94  Resp: 18 15 12 16   Temp:   97.6 F (36.4 C)   TempSrc:   Oral   SpO2: 96% 93%  96%  Weight:      Height:        Body mass index is 24.21 kg/m.  Physical Exam   Physical Exam HEENT- [place]/AT   Lungs- Respirations unlabored Extremities- Warm and well-perfused  Neurological Examination Mental Status: Awake and alert. Partially oriented. Thought content appropriate. Mild cognitive slowing. Speech fluent with intact naming and  comprehension. Able to follow all commands without difficulty. Cranial Nerves: II: Temporal visual fields intact with no extinction to DSS. PERRL. III,IV, VI: No ptosis. EOMI. No nystagmus. V: Temp sensation is subjectively equal bilaterally VII: Smile symmetric VIII: Hearing intact to voice IX,X: No hypophonia or hoarseness XI: Symmetric XII: Midline tongue extension Motor: RUE: 4+/5 LUE: 5/5 RLE: 4+/5 LLE: 5/5 Sensory: Temp sensation decreased to RUE and RLE. FT also decreased on the right. No extinction to DSS. Deep Tendon Reflexes: 2+ and symmetric bilateral biceps, brachioradialis and patellae Cerebellar: No  ataxia with FNF bilaterally, but somewhat slow Gait: Deferred   Labs/Imaging/Neurodiagnostic studies   CBC:  Recent Labs  Lab 2024-03-31 1651  WBC 5.0  NEUTROABS 2.4  HGB 13.0  HCT 39.6  MCV 91.2  PLT 210   Basic Metabolic Panel:  Lab Results  Component Value Date   NA 137 31-Mar-2024   K 4.0 03/31/24   CO2 23 2024-03-31   GLUCOSE 141 (H) 03/31/2024   BUN 19 31-Mar-2024   CREATININE 1.15 03-31-2024   CALCIUM  8.9 2024-03-31   GFRNONAA >60 2024/03/31   GFRAA 84 09/10/2020   Lipid Panel:  Lab Results  Component Value Date   LDLCALC 78 03/22/2024   HgbA1c:  Lab Results  Component Value Date   HGBA1C 6.3 (H) 03-31-2024   Urine Drug Screen: No results found for: "LABOPIA", "COCAINSCRNUR", "LABBENZ", "AMPHETMU", "THCU", "LABBARB"  Alcohol Level     Component Value Date/Time   ETH <15 31-Mar-2024 1651   INR  Lab Results  Component Value Date   INR 1.1 Mar 31, 2024   APTT  Lab Results  Component Value Date   APTT 28 2024-03-31     ASSESSMENT  Ronnie Bullock is a 78 y.o. male presenting with new onset of right arm and leg weakness with sensory numbness.  - Exam reveals mild weakness of his RUE and RLE. Temp and FT sensation decreased to RUE and RLE. No extinction to DSS. - HgbA1c elevated at 6.3. LDL is normal - MRI brain: Small focus of acute ischemia within the posterior left frontal lobe, within the ACA territory. Relatively large region of patchy encephalomalacia secondary to a chronic left frontal lobe infarct, with associated multifocal chronic microhemorrhages. Small old left cerebellar infarct. Right ostiomeatal complex pattern sinusitis. - TTE is completed with report pending.  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  - Multifocal chronic microhemorrhages.     RECOMMENDATIONS  - CTA of head and neck - Cardiac telemetry - PT consult, OT consult, Speech consult - Statin - Continue Eliquis  (history of DVT) - BP management per standard protocol. Out of the permissive HTN time window - Risk factor modification - Frequent neuro checks - NPO until passes stroke swallow screen   ______________________________________________________________________    Hope Ly, Desarea Ohagan, MD [facility]

## 2024-03-22 NOTE — Progress Notes (Addendum)
 SLP Cancellation Note  Patient Details Name: Ronnie Bullock MRN: [id number] DOB: [birth year]   Cancelled treatment:       Reason Eval/Treat Not Completed: SLP screened, no needs identified, will sign off (chart reviewed; consulted NSG)  Pt admitted w/ RLE weakness and frequent Falls in recent hours prior to admit. Pt does to an Adult Day Care during week. He denied any other weakness/deficits this morning to this SLP but did describe that "my R leg is weak and I have been falling some".  Pt has Baseline Dementia. He was oriented to name, "hospital", city where he lives- "[place]". Speech is intelligible and he often said "yep".   Pt denied any difficulty swallowing and has passed his Swallow Screen w/ NSG. He has swallowed pills w/ water per report. Pt conversed in basic conversation about going to the Adult Day Care for activities and what he liked to eat. He chose from a F: 2 the flavor of juice he wanted. No gross expressive/receptive deficits noted in basic conversation/interaction w/ pt; pt denied any speech problems. He followed 1step commands by NSG and this SLP accurately to sit himself upright in bed.  Pt has a prominent upper Denture plate. No bottom dentition. Briefly discussed easy to chew foods; offered suggestions w/ diet ordering. NSG updated.   No further Acute skilled ST services indicated at this time as pt appears to be exhibiting functional communication. He is able to follow basic directions and engage to answer questions re: self w/ cue. Pt agreed. Recommend pt f/u w/ Outpatient services if any decline from his known communication Baseline becomes evident post return home (to his own routine and structure, and post acute illness). Recommend Supervision at Discharge for Safety; pt lives w/ his Brother per chart notes. MD to reconsult if any new needs/change in status while admitted.     Darla Edward, MS, CCC-SLP Speech Language Pathologist Rehab Services; [facility]  [facility] [phone] (ascom) [name] 03/22/2024, 9:07 AM

## 2024-03-22 NOTE — Progress Notes (Signed)
 PROGRESS NOTE    Ronnie Bullock   ZOX:[id number] DOB: 1946-06-05  DOA: 03/21/2024 Date of Service: 03/22/24 which is hospital day 0  PCP: [name], DO    Hospital course / significant events:   HPI: Ronnie Bullock is a 78 y.o. 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  He had no injury related to the falls. Patient goes to adult daycare who called EMS to take him to the ED after they noticed that he was unable to participate as he usually does as his right leg kept giving away. Patient is unable to contribute to history due to dementia   05/15: CT head nonacute. admitted to hospitalist for CVA workup. MRI showing Small focus of acute ischemia within the posterior left frontal lobe, within the ACA territory.  05/16: PT/OT recs for SNF. Neuro consult. Echo pending.    Consultants:  neurology  Procedures/Surgeries: none      ASSESSMENT & PLAN:   Acute CVA (cerebrovascular accident)  MRI (+) acute ischemia within the posterior left frontal lobe, within the ACA territory Permissive hypertension for first 24-48 hrs post stroke onset: Prn Labetalol IV or Vasotec IV If BP greater than 220/120  Statins w/ LDL goal less than 70 Continue Eliquis . No ASA/Plavix, pending neuro recs  Echo Neuro checks q4 hrs x 24 hrs and then per shift Head of bed 30 degrees Neurology consult to follow   Essential hypertension, benign Hold home antihypertensives (amlodipine ) for permissive hypertension   Chronic anticoagulation History of DVT LLE 2022 Continue Eliquis    History of prostate cancer Urinary retention s/p radiation for prostate CA No acute issues suspected at this time Tamsulosin  Outpatient f/u   Dementia without behavioral disturbance  Delirium precautions Continue home  Aricept     No concerns based on BMI: Body mass index is 24.21 kg/m.Aaron Aas Significantly low or high BMI is associated with higher medical risk.  Underweight - under 18  overweight - 25 to 29 obese - 30 or more Class 1 obesity: BMI of 30.0 to 34 Class 2 obesity: BMI of 35.0 to 39 Class 3 obesity: BMI of 40.0 to 49 Super Morbid Obesity: BMI 50-59 Super-super Morbid Obesity: BMI 60+ Healthy nutrition and physical activity advised as adjunct to other disease management and risk reduction treatments    DVT prophylaxis: Eliquis   IV fluids: no continuous IV fluids  Nutrition: regular diet  Central lines / other devices: none  Code Status: FULL CODE ACP documentation reviewed:  none on file in [facility]  TOC needs: SNF placement  Medical barriers to dispo: complating neuro w/u for CVA. Expected medical readiness for discharge tomorrow.          Subjective / Brief ROS:  Patient reports no concerns orhter than RLE weakness He is pleasantly confused, redirectable Denies CP/SOB.  Pain controlled.  Tolerating diet per SLP and RN.   Family Communication: called sister Micelle 03/22/24 3:36 PM all questions answered      Objective Findings:  Vitals:   03/22/24 0830 03/22/24 0835 03/22/24 0900 03/22/24 1324  BP: (!) 138/94  (!) 137/105 129/83  Pulse: 84 85  94  Resp: 18 15 12 16   Temp:   97.6 F (36.4 C)   TempSrc:   Oral   SpO2: 96% 93%  96%  Weight:      Height:  Intake/Output Summary (Last 24 hours) at 03/22/2024 1531 Last data filed at 03/22/2024 1419 Gross per 24 hour  Intake 0 ml  Output --  Net 0 ml   Filed Weights   03/21/24 1647 03/21/24 1649  Weight: 68 kg 68 kg    Examination:  Physical Exam Constitutional:      General: He is not in acute distress. Cardiovascular:     Rate and Rhythm: Normal rate and regular rhythm.  Pulmonary:     Effort: Pulmonary effort is normal. No respiratory distress.     Breath sounds: Normal breath sounds.   Abdominal:     General: Abdomen is flat.     Palpations: Abdomen is soft.  Musculoskeletal:     Right lower leg: No edema.     Left lower leg: No edema.  Skin:    General: Skin is warm and dry.  Neurological:     Mental Status: He is alert. He is disoriented.     Motor: Weakness (RLE) present.  Psychiatric:        Mood and Affect: Mood normal.        Behavior: Behavior normal.          Scheduled Medications:    stroke: early stages of recovery book   Does not apply Once   apixaban   5 mg Oral BID   donepezil   5 mg Oral QHS   tamsulosin   0.8 mg Oral Daily    Continuous Infusions:   PRN Medications:  acetaminophen  **OR** acetaminophen  (TYLENOL ) oral liquid 160 mg/5 mL **OR** acetaminophen   Antimicrobials from admission:  Anti-infectives (From admission, onward)    None           Data Reviewed:  I have personally reviewed the following...  CBC: Recent Labs  Lab 03/21/24 1651  WBC 5.0  NEUTROABS 2.4  HGB 13.0  HCT 39.6  MCV 91.2  PLT 210   Basic Metabolic Panel: Recent Labs  Lab 03/21/24 1651  NA 137  K 4.0  CL 105  CO2 23  GLUCOSE 141*  BUN 19  CREATININE 1.15  CALCIUM  8.9   GFR: Estimated Creatinine Clearance: 47 mL/min (by C-G formula based on SCr of 1.15 mg/dL). Liver Function Tests: Recent Labs  Lab 03/21/24 1651  AST 20  ALT 10  ALKPHOS 68  BILITOT 0.8  PROT 7.5  ALBUMIN 3.1*   No results for input(s): "LIPASE", "AMYLASE" in the last 168 hours. No results for input(s): "AMMONIA" in the last 168 hours. Coagulation Profile: Recent Labs  Lab 03/21/24 1651  INR 1.1   Cardiac Enzymes: No results for input(s): "CKTOTAL", "CKMB", "CKMBINDEX", "TROPONINI" in the last 168 hours. BNP (last 3 results) No results for input(s): "PROBNP" in the last 8760 hours. HbA1C: Recent Labs    03/21/24 1651  HGBA1C 6.3*   CBG: No results for input(s): "GLUCAP" in the last 168 hours. Lipid Profile: Recent Labs    03/22/24 0445   CHOL 136  HDL 50  LDLCALC 78  TRIG 42  CHOLHDL 2.7   Thyroid  Function Tests: No results for input(s): "TSH", "T4TOTAL", "FREET4", "T3FREE", "THYROIDAB" in the last 72 hours. Anemia Panel: No results for input(s): "VITAMINB12", "FOLATE", "FERRITIN", "TIBC", "IRON", "RETICCTPCT" in the last 72 hours. Most Recent Urinalysis On File:     Component Value Date/Time   COLORURINE YELLOW (A) 02/12/2021 1658   APPEARANCEUR Clear 11/06/2023 1056   LABSPEC 1.017 02/12/2021 1658   PHURINE 5.0 02/12/2021 1658   GLUCOSEU Negative 11/06/2023 1056  HGBUR NEGATIVE 02/12/2021 1658   BILIRUBINUR Negative 11/06/2023 1056   KETONESUR NEGATIVE 02/12/2021 1658   PROTEINUR 3+ (A) 11/06/2023 1056   PROTEINUR 100 (A) 02/12/2021 1658   UROBILINOGEN 0.2 10/30/2020 1403   NITRITE Negative 11/06/2023 1056   NITRITE NEGATIVE 02/12/2021 1658   LEUKOCYTESUR Negative 11/06/2023 1056   LEUKOCYTESUR NEGATIVE 02/12/2021 1658   Sepsis Labs: @LABRCNTIP (procalcitonin:4,lacticidven:4) Microbiology: No results found for this or any previous visit (from the past 240 hours).    Radiology Studies last 3 days: MR BRAIN WO CONTRAST Result Date: 03/21/2024 CLINICAL DATA:  Right-sided weakness EXAM: MRI HEAD WITHOUT CONTRAST TECHNIQUE: Multiplanar, multiecho pulse sequences of the brain and surrounding structures were obtained without intravenous contrast. COMPARISON:  None Available. FINDINGS: Brain: Small focus of acute ischemia within the posterior left frontal lobe, within the ACA territory. Multifocal chronic microhemorrhage in the left temporal lobe. There is confluent hyperintense T2-weighted signal within the white matter. Generalized volume loss. Old left frontal and left cerebellar infarcts. The midline structures are normal. Vascular: Normal flow voids. Skull and upper cervical spine: Normal calvarium and skull base. Visualized upper cervical spine and soft tissues are normal. Sinuses/Orbits:Right ostiomeatal  pattern sinusitis.  Normal orbits. IMPRESSION: 1. Small focus of acute ischemia within the posterior left frontal lobe, within the ACA territory. No acute hemorrhage or mass effect. 2. Old left frontal and left cerebellar infarcts. 3. Right ostiomeatal complex pattern sinusitis. Electronically Signed   By: Juanetta Nordmann M.D.   On: 03/21/2024 21:58   CT HEAD WO CONTRAST Result Date: 03/21/2024 CLINICAL DATA:  Right leg numbness EXAM: CT HEAD WITHOUT CONTRAST TECHNIQUE: Contiguous axial images were obtained from the base of the skull through the vertex without intravenous contrast. RADIATION DOSE REDUCTION: This exam was performed according to the departmental dose-optimization program which includes automated exposure control, adjustment of the mA and/or kV according to patient size and/or use of iterative reconstruction technique. COMPARISON:  CT 02/12/2021 FINDINGS: Brain: No acute territorial infarction, hemorrhage or intracranial mass. Atrophy and moderate severe chronic small vessel ischemic changes of the white matter. Stable ventricle size and morphology. 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  Carotid vascular calcification Skull: No fracture Sinuses/Orbits: Moderate mucosal thickening in the maxillary sinuses, ethmoid sinuses and right frontal sinus. Other: None IMPRESSION: 1. No CT evidence for acute intracranial abnormality. 2. Atrophy and chronic small vessel ischemic changes of the white matter. Small chronic infarct in the left cerebellum. Electronically Signed   By: Esmeralda Hedge M.D.   On: 03/21/2024 18:27       Time spent: 35 min     Barkley Kratochvil, DO [facility] 03/22/2024, 3:31 PM    Dictation software may have been used to generate the above note. Typos may occur and escape review in typed/dictated notes. Please contact Dr Authur Leghorn directly for clarity if needed.  Staff may message me via secure chat in Epic  but this may not  receive an immediate response,  please page me for urgent matters!  If 7PM-7AM, please contact night coverage [email or web address]

## 2024-03-22 NOTE — Progress Notes (Signed)
 Mobility Specialist - Progress Note   Pre-mobility: HR, BP, SpO2 During mobility: HR, BP, SpO2 Post-mobility: HR, BP, SPO2     03/22/24 1626  Mobility  Activity Ambulated with assistance in hallway  Level of Assistance Standby assist, set-up cues, supervision of patient - no hands on  Assistive Device Front wheel walker  Distance Ambulated (ft) 85 ft  Range of Motion/Exercises Active  Activity Response Tolerated well  Mobility Referral Yes  Mobility visit 1 Mobility  Mobility Specialist Start Time (ACUTE ONLY) 1556  Mobility Specialist Stop Time (ACUTE ONLY) 1612  Mobility Specialist Time Calculation (min) (ACUTE ONLY) 16 min   Pt resting in bed on RA upon entry. Pt STS and ambulates to hallway around NS SBA with RW. Pt endorses pain in right leg and slight foot drag observed in right foot. Limp in left leg to support right leg. Pt had no bucking and responded well to ambulation instructions. Pt returned to recliner and left with needs in reach. Chair alarm activated.   [name] Mobility Specialist 03/22/24, 4:42 PM

## 2024-03-22 NOTE — Assessment & Plan Note (Signed)
 MRI showing Small focus of acute ischemia within the posterior left frontal lobe, within the ACA territory Permissive hypertension for first 24-48 hrs post stroke onset: Prn Labetalol IV or Vasotec IV If BP greater than 220/120  Statins for LDL goal less than 70 Continue Eliquis .  Will hold off on aspirin and Plavix to decrease bleeding risk ,pending neurology recommendations Telemetry, echo,  Avoid dextrose  containing fluids, Maintain euglycemia, euthermia Neuro checks q4 hrs x 24 hrs and then per shift Head of bed 30 degrees Physical therapy/Occupational therapy/Speech therapy if failed dysphagia screen Neurology consult to follow

## 2024-03-22 NOTE — Care Management Obs Status (Signed)
 MEDICARE OBSERVATION STATUS NOTIFICATION   Patient Details  Name: ATHOL POLLAK MRN: [id number] Date of Birth: 1946/02/13   Medicare Observation Status Notification Given:  Yes    Nellene Banana Joshue Badal, RN 03/22/2024, 9:53 AM

## 2024-03-22 NOTE — Hospital Course (Addendum)
 Hospital course / significant events:   HPI: Ronnie Bullock is a 78 y.o. 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  He had no injury related to the falls. Patient goes to adult daycare who called EMS to take him to the ED after they noticed that he was unable to participate as he usually does as his right leg kept giving away. Patient is unable to contribute to history due to dementia   05/15: CT head nonacute. admitted to hospitalist for CVA workup. MRI showing Small focus of acute ischemia within the posterior left frontal lobe, within the ACA territory.  05/16: PT/OT recs for SNF. Neuro consult. Echo pending. CTA H/N pending.  05/17: abd CTA H/N w/ L ACA abn liekly atherosclerotis but cannot r/o neurosyphilis / fibromuscular dysplasia, also saccular aneurysm - pending RPR -->  RPR and HIV NR. 05/18-05/20: Neuro s/o. 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 However, Ddx for rare causes would include neurosyphilis, FMD, mycotic vessel changes .  PHASES score for the aneurysm is 2, corresponding to a 0.4% 5-year risk of aneurysm rupture.  Permissive hypertension window has passed and BP has been at goal,  continue monitor VS routinely Statins w/ LDL goal less than 70 Continue Eliquis . No ASA/Plavix. If Eliquis  were to be d/c will need ASA  RPR and HIV NR. Fungal infection seems improbable given atherosclerotic disease far more likely and no other clinical suspicion for fungemia, will not get fungal culture at this time  Risk of aneurysm surgery outweighs benefits --> no surgical intervention  In patients with an ischemic stroke or TIA attributed to FMD, secondary prevention measures include antiplatelet therapy, blood pressure control, and lifestyle modification which is all in progress, if other concerns for fibromuscular dysplasia would work this up outpatient    Essential hypertension, benign held home antihypertensives (amlodipine ) for permissive hypertension and BP has been at goal, monitor VS and restart/adjust as needed    Chronic anticoagulation History of DVT LLE 2022 Continue Eliquis , however low risk DVT and high risk falls will d/w daughter re: continuation of this If d/c will need ASA as above for CVA    History of prostate cancer 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  Underweight - under 18  overweight - 25 to 29 obese - 30 or more Class 1 obesity: BMI of 30.0 to 34 Class 2 obesity: BMI of 35.0 to 39 Class 3 obesity: BMI of 40.0 to 49 Super Morbid Obesity: BMI 50-59 Super-super Morbid Obesity: BMI 60+ Healthy nutrition and physical activity advised as adjunct to  other disease management and risk reduction treatments    DVT prophylaxis: Eliquis   IV fluids: no continuous IV fluids  Nutrition: regular diet  Central lines / other devices: none  Code Status: FULL CODE ACP  documentation reviewed:  none on file in [facility]  [facility] needs: SNF placement  Medical barriers to dispo: none at this time

## 2024-03-22 NOTE — Evaluation (Signed)
 Occupational Therapy Evaluation Patient Details Name: Ronnie Bullock MRN: [id number] DOB: 05-23-1946 Today's Date: 03/22/2024   History of Present Illness   Pt is a 78 y.o. male being admitted with RLE weakness resulting in 6 falls. MRI brain: Small focus of acute ischemia within the posterior left frontal lobe, within the ACA territory. 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     Clinical Impressions Pt was seen for OT evaluation this date. Pt is a poor historian, but based on information he provided, current and past chart reviews, pt lives with his brother and attends adult daycare during the day. Pt reports ambulating without AD and being IND with his ADLs.   Pt presents to acute OT demonstrating impaired ADL performance and functional mobility 2/2 weakness, balance deficits, mild sensation deficits in his RLE. Seems that pt's speech issues are baseline for him. He denies pain. Pt currently requires SUP for bed mobility. He needs CGA for 3 STS from EOB to RW for safety and is noted with bowel and urine incont in brief requiring full linen and clothing change. Min A for UB dressing tasks and Min/CGA for LB peri-care in standing with unilateral support on RW and cueing for safety, e.g. keeping one hand on the walker for support. Max A for LB dressing to doff/don brief. CGA for seated lateral lean to don/doff socks. Amb ~10 feet using RW with Min A for safety d/t RLE deficits, unable to clear RLE, therefore drags/slides it across the floor with no active plantar/dorsiflexion in R ankle. BUE AROM intact, BUE strength intact except mild R hand weakness. FMC/GMC intact for nose to finger and opposition. Able to self feed and manage remote in room without difficulty. Pt would benefit from skilled OT services to address noted impairments and functional limitations to maximize safety and independence while minimizing falls risk and caregiver  burden. Do anticipate the need for follow up OT services upon acute hospital DC.        If plan is discharge home, recommend the following:   A little help with walking and/or transfers;A lot of help with bathing/dressing/bathroom;Direct supervision/assist for financial management;Supervision due to cognitive status;Help with stairs or ramp for entrance     Functional Status Assessment   Patient has had a recent decline in their functional status and demonstrates the ability to make significant improvements in function in a reasonable and predictable amount of time.     Equipment Recommendations   Other (comment) (defer to next venue)     Recommendations for Other Services         Precautions/Restrictions   Precautions Precautions: Fall Recall of Precautions/Restrictions: Intact Restrictions Weight Bearing Restrictions Per Provider Order: No     Mobility Bed Mobility Overal bed mobility: Needs Assistance Bed Mobility: Supine to Sit, Sit to Supine     Supine to sit: Supervision Sit to supine: Supervision   General bed mobility comments: no assist for bed mobility, able to perform safely and only cued to scoot forward    Transfers Overall transfer level: Needs assistance Equipment used: Rolling walker (2 wheels) Transfers: Sit to/from Stand Sit to Stand: Contact guard assist           General transfer comment: CGA for safety with STS from EOB x3 during session to RW; amb to door and back using RW with Min A d/t RLE deficits      Balance Overall balance assessment: Needs assistance Sitting-balance support: Feet supported  Sitting balance-Leahy Scale: Good     Standing balance support: Reliant on assistive device for balance, During functional activity, Single extremity supported Standing balance-Leahy Scale: Fair                             ADL either performed or assessed with clinical judgement   ADL Overall ADL's : Needs  assistance/impaired                 Upper Body Dressing : Minimal assistance;Sitting;Standing   Lower Body Dressing: Supervision/safety;Sitting/lateral leans;Maximal assistance Lower Body Dressing Details (indicate cue type and reason): to don/doff sock; Max A for donning/doffing brief     Toileting- Clothing Manipulation and Hygiene: Contact guard assist Toileting - Clothing Manipulation Details (indicate cue type and reason): CGA to maintain balance and safely perform peri-care in standing using wipes             Vision         Perception         Praxis         Pertinent Vitals/Pain Pain Assessment Pain Assessment: No/denies pain     Extremity/Trunk Assessment Upper Extremity Assessment Upper Extremity Assessment: Right hand dominant;Generalized weakness (very mild R hand weakness)   Lower Extremity Assessment Lower Extremity Assessment: RLE deficits/detail;Generalized weakness RLE Deficits / Details: reports diminished sensation to R calf/shin; no ability to plantar or dorsiflex ankle in bed; hyperextends knee in standing during peri-care       Communication Communication Communication: Impaired Factors Affecting Communication: Difficulty expressing self;Reduced clarity of speech   Cognition Arousal: Alert Behavior During Therapy: [facility] for tasks assessed/performed Cognition: History of cognitive impairments             OT - Cognition Comments: states name, hospital in [place], and month of May, believes it is 2003; states he was brought in due to his RLE                         Cueing  General Comments   Cueing Techniques: Verbal cues      Exercises Other Exercises Other Exercises: Edu on role of OT in acute setting   Shoulder Instructions      Home Living Family/patient expects to be discharged to:: Private residence Living Arrangements: Other relatives (brother) Available Help at Discharge: Family;Available 24  hours/day Type of Home: House Home Access: Stairs to enter;Ramped entrance     Home Layout: One level     Bathroom Shower/Tub: Walk-in shower         Home Equipment: Cane - single point          Prior Functioning/Environment Prior Level of Function : Patient poor historian/Family not available             Mobility Comments: pt reports IND ambulator and that he has a cane at home if needed; unsure of accuracy of info; mult falls prior to admission ADLs Comments: pt reports IND and attending daycare during the day    OT Problem List: Decreased strength;Impaired balance (sitting and/or standing)   OT Treatment/Interventions: Self-care/ADL training;Balance training;Therapeutic exercise;Therapeutic activities;Patient/family education      OT Goals(Current goals can be found in the care plan section)   Acute Rehab OT Goals Patient Stated Goal: improve function OT Goal Formulation: With patient Time For Goal Achievement: 04/05/24 Potential to Achieve Goals: Good ADL Goals Pt Will Perform Lower Body Bathing: with supervision;sit to/from  stand;sitting/lateral leans Pt Will Perform Lower Body Dressing: with supervision;sit to/from stand;sitting/lateral leans Pt Will Transfer to Toilet: with supervision;regular height toilet;ambulating Pt Will Perform Toileting - Clothing Manipulation and hygiene: with supervision;sit to/from stand;sitting/lateral leans   OT Frequency:  Min 3X/week    Co-evaluation              AM-PAC OT "6 Clicks" Daily Activity     Outcome Measure Help from another person eating meals?: None Help from another person taking care of personal grooming?: None Help from another person toileting, which includes using toliet, bedpan, or urinal?: A Little Help from another person bathing (including washing, rinsing, drying)?: A Lot Help from another person to put on and taking off regular upper body clothing?: None Help from another person to put on  and taking off regular lower body clothing?: A Lot 6 Click Score: 19   End of Session Equipment Utilized During Treatment: Gait belt;Rolling walker (2 wheels) Nurse Communication: Mobility status  Activity Tolerance: Patient tolerated treatment well Patient left: in bed;with call bell/phone within reach;with bed alarm set;with nursing/sitter in room  OT Visit Diagnosis: Other abnormalities of gait and mobility (R26.89);Muscle weakness (generalized) (M62.81);Unsteadiness on feet (R26.81)                Time: 9147-8295 OT Time Calculation (min): 36 min Charges:  OT General Charges $OT Visit: 1 Visit OT Evaluation $OT Eval Moderate Complexity: 1 Mod OT Treatments $Self Care/Home Management : 8-22 mins Carliyah Cotterman, OTR/L 03/22/24, 1:01 PM  Jacquette Canales E Sunita Demond 03/22/2024, 12:54 PM

## 2024-03-22 NOTE — Evaluation (Signed)
 Physical Therapy Evaluation Patient Details Name: Ronnie Bullock MRN: [id number] DOB: 1946/08/18 Today's Date: 03/22/2024  History of Present Illness  Pt is a 78 y.o. male being admitted with RLE weakness resulting in 6 falls. MRI brain: Small focus of acute ischemia within the posterior left frontal lobe, within the ACA territory. 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  Clinical Impression  Patient resting in bed upon arrival to room; alert and oriented to self, location and general situation.  Mild word-finding difficulty, expressive aphasia evident (often defaults to "yes, yep"); does effectively communicate wants/needs appropriately.  Denies acute pain.  Does endorse persistent paresthesia R UE (elbow distally) and paresthesia, weakness to R LE (knee distally) with isolated testing and functional activity.  Demonstrates ability to complete bed mobility with supervision; sit/stand, standing balance (BERG 12/56), basic transfers and gait (100') with RW, min/mod assist.  Demonstrates absent R LE heel strike/toe off, tends to drag foot during limb advancement (deteriorates further demonstrating R LE ER, R pelvic rotation to R with fatigue). Min/mod assist for dynamic balance with turn management (to prevent posterior LOB). Do recommend continued use of RW and +1 at all times with gait/mobility  Would benefit from skilled PT to address above deficits and promote optimal return to PLOF.; recommend post-acute PT follow up as indicated by interdisciplinary care team.          If plan is discharge home, recommend the following: A little help with walking and/or transfers;A little help with bathing/dressing/bathroom   Can travel by private vehicle   Yes    Equipment Recommendations Rolling walker (2 wheels)  Recommendations for Other Services       Functional Status Assessment Patient has had a recent decline in their functional  status and demonstrates the ability to make significant improvements in function in a reasonable and predictable amount of time.     Precautions / Restrictions Precautions Precautions: Fall Restrictions Weight Bearing Restrictions Per Provider Order: No      Mobility  Bed Mobility Overal bed mobility: Needs Assistance Bed Mobility: Supine to Sit     Supine to sit: Supervision          Transfers Overall transfer level: Needs assistance Equipment used: Rolling walker (2 wheels) Transfers: Sit to/from Stand Sit to Stand: Min assist           General transfer comment: cuing for hand placement, tends to pull on RW    Ambulation/Gait Ambulation/Gait assistance: Min assist Gait Distance (Feet): 100 Feet Assistive device: Rolling walker (2 wheels)         General Gait Details: absent R LE heel strike/toe off, tends to drag foot during limb advancement (deteriorates further demonstrating R LE ER, R pelvic rotation to R with fatigue). Min/mod assist for dynamic balance with turn management (to prevent posterior LOB).  Do recommend continued use of RW and +1 at all times with gait/mobility  Stairs            Wheelchair Mobility     Tilt Bed    Modified Rankin (Stroke Patients Only)       Balance Overall balance assessment: Needs assistance Sitting-balance support: No upper extremity supported, Feet supported Sitting balance-Leahy Scale: Good     Standing balance support: No upper extremity supported Standing balance-Leahy Scale: Poor                   Standardized Balance Assessment Standardized Balance Assessment : Berg Balance  Test Berg Balance Test Sit to Stand: Able to stand using hands after several tries Standing Unsupported: Unable to stand 30 seconds unassisted Sitting with Back Unsupported but Feet Supported on Floor or Stool: Able to sit 2 minutes under supervision Stand to Sit: Controls descent by using hands Transfers: Needs one  person to assist Standing Unsupported with Eyes Closed: Needs help to keep from falling Standing Ubsupported with Feet Together: Needs help to attain position and unable to hold for 15 seconds From Standing, Reach Forward with Outstretched Arm: Reaches forward but needs supervision From Standing Position, Pick up Object from Floor: Unable to pick up and needs supervision From Standing Position, Turn to Look Behind Over each Shoulder: Needs supervision when turning Turn 360 Degrees: Needs assistance while turning Standing Unsupported, Alternately Place Feet on Step/Stool: Needs assistance to keep from falling or unable to try Standing Unsupported, One Foot in Front: Loses balance while stepping or standing Standing on One Leg: Unable to try or needs assist to prevent fall Total Score: 12         Pertinent Vitals/Pain Pain Assessment Pain Assessment: No/denies pain    Home Living Family/patient expects to be discharged to:: Private residence Living Arrangements: Other relatives (brother) Available Help at Discharge: Family;Available 24 hours/day Type of Home: House Home Access: Stairs to enter Entrance Stairs-Rails: Right     Home Layout: One level Home Equipment: Cane - single point      Prior Function Prior Level of Function : Patient poor historian/Family not available             Mobility Comments: Pt reports IND ambulator and that he has a cane at home if needed; unsure of accuracy of info; mult falls prior to admission.  Goes to adult day program 5days/week       Extremity/Trunk Assessment   Upper Extremity Assessment Upper Extremity Assessment: RUE deficits/detail RUE Deficits / Details: grossly 4+ to 5/5 throughout; mild paresthesia indicated elbow distally.  Able to use functionally without difficulty; fair/good coordination control with functional tasks    Lower Extremity Assessment Lower Extremity Assessment: RLE deficits/detail RLE Deficits / Details: R  hip and knee grossly 3-/5, R ankle PF/DF 0/5; generalized paresthesia mid-calf distally       Communication   Communication Communication: Impaired Factors Affecting Communication: Difficulty expressing self;Reduced clarity of speech    Cognition Arousal: Alert Behavior During Therapy: WFL for tasks assessed/performed                           PT - Cognition Comments: Oriented to self, location as hospital, general reason for admission; follows simple verbal commands, pleasant and cooperative; moderate word-finding difficulties/expressive aphasia at times, often defaulting to "yes, yep"         Cueing Cueing Techniques: Verbal cues     General Comments General comments (skin integrity, edema, etc.): Mild/mod pushing behaviors towards L with higher-level standing balance activities (improves once intensity of activity diminishes)    Exercises     Assessment/Plan    PT Assessment Patient needs continued PT services  PT Problem List Decreased strength;Decreased range of motion;Decreased activity tolerance;Decreased safety awareness;Decreased balance;Decreased mobility;Decreased coordination;Decreased cognition;Decreased knowledge of use of DME;Decreased knowledge of precautions;Impaired sensation       PT Treatment Interventions DME instruction;Gait training;Stair training;Functional mobility training;Therapeutic activities;Therapeutic exercise;Balance training;Neuromuscular re-education;Cognitive remediation;Patient/family education    PT Goals (Current goals can be found in the Care Plan section)  Acute Rehab PT Goals  Patient Stated Goal: to get better PT Goal Formulation: With patient Time For Goal Achievement: 04/05/24 Potential to Achieve Goals: Good    Frequency Min 3X/week     Co-evaluation               AM-PAC PT "6 Clicks" Mobility  Outcome Measure Help needed turning from your back to your side while in a flat bed without using bedrails?:  None Help needed moving from lying on your back to sitting on the side of a flat bed without using bedrails?: None Help needed moving to and from a bed to a chair (including a wheelchair)?: A Little Help needed standing up from a chair using your arms (e.g., wheelchair or bedside chair)?: A Little Help needed to walk in hospital room?: A Little Help needed climbing 3-5 steps with a railing? : A Lot 6 Click Score: 19    End of Session Equipment Utilized During Treatment: Gait belt Activity Tolerance: Patient tolerated treatment well Patient left: in chair;with call bell/phone within reach Nurse Communication: Mobility status PT Visit Diagnosis: Hemiplegia and hemiparesis Hemiplegia - Right/Left: Right Hemiplegia - dominant/non-dominant: Dominant Hemiplegia - caused by: Cerebral infarction    Time: 1005-1025 PT Time Calculation (min) (ACUTE ONLY): 20 min   Charges:   PT Evaluation $PT Eval Moderate Complexity: 1 Mod   PT General Charges $$ ACUTE PT VISIT: 1 Visit        Mackenzee Becvar H. Bevin Bucks, PT, DPT, NCS 03/22/24, 2:48 PM [phone]

## 2024-03-23 ENCOUNTER — Observation Stay

## 2024-03-23 DIAGNOSIS — I639 Cerebral infarction, unspecified: Secondary | ICD-10-CM | POA: Diagnosis not present

## 2024-03-23 LAB — ECHOCARDIOGRAM COMPLETE
AR max vel: 3.01 cm2
AV Peak grad: 5.9 mmHg
Ao pk vel: 1.21 m/s
Area-P 1/2: 4.06 cm2
Height: 66 in
S' Lateral: 1.9 cm
Weight: 2400 [oz_av]

## 2024-03-23 LAB — HIV ANTIBODY (ROUTINE TESTING W REFLEX): HIV Screen 4th Generation wRfx: NONREACTIVE

## 2024-03-23 MED ORDER — IOHEXOL 350 MG/ML SOLN
75.0000 mL | Freq: Once | INTRAVENOUS | Status: AC | PRN
  Administered 2024-03-23: 75 mL via INTRAVENOUS

## 2024-03-23 NOTE — Plan of Care (Signed)
 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 This might be an unusual presentation of Fibromuscular Dysplasia (FMD). Mycotic vessel changes are also a consideration. 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 Bilateral ICA siphon calcified plaque but only mild associated siphon stenosis. No significant extracranial atherosclerosis or stenosis. Generalized arterial tortuosity in the head and neck.   A/R: Ronnie Bullock is a 78 y.o. male presenting with new onset of right arm and leg weakness with sensory numbness.  - Most likely etiology for the left ACA changes seen on CTA is felt to be atherosclerotic disease. Rare etiologies such as neurosyphilis can also be considered, in addition to rare presentation of FMD.  - RPR has been ordered. - Can get fungal blood culture regarding the DDx of mycotic vessel changes for the saccular aneurysm seen by Radiology. ID input may also be needed.  - Consider discussing other possible work up for neurosyphilis with ID - His PHASES score for the aneurysm is 2, corresponding to a 0.4% 5-year risk of aneurysm rupture. Risk of surgery therefore outweighs benefits.  - Given the low risk of aneurysm rupture, the benefits of continuing Eliquis  for DVT may outweigh the risks. However, if he is considered to be low-risk for DVT recurrence and Eliquis  is stopped, he would need to be switched to ASA.  - See my consult note from yesterday for other recommendations.   Electronically signed: Dr. Andrienne Havener

## 2024-03-23 NOTE — Plan of Care (Signed)
   Problem: Education: Goal: Knowledge of disease or condition will improve Outcome: Progressing Goal: Knowledge of secondary prevention will improve (MUST DOCUMENT ALL) Outcome: Progressing Goal: Knowledge of patient specific risk factors will improve (DELETE if not current risk factor) Outcome: Progressing

## 2024-03-23 NOTE — Plan of Care (Addendum)
 3rd shift reported possible Neuro status change in patient. NIH went from 2 to 4 per their 0430 assessment.  Change noted in LF eye re-activeness and patient struggling to read and describe pictures. RN stated, " almost seemed like patient was "guessing" about what he was looking at, rather than being able to see it clearly (as with the midnight NIH). Night hospitalist was informed about possible Neurological changes  1st shift Hosp and neuro informed of possible changes and CT ordered.  Drs will round as able.  Repeat NIH completed by this RN and noted possible NIH score change from 2 to 3 this morning.  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  This does make it challenging to confirm what is baseline and what are new changes. Neuro assessment also completed. When asked, the patient verbalized, " I dont feel my vision is any different from yesterday on admission."

## 2024-03-23 NOTE — Progress Notes (Addendum)
 PROGRESS NOTE    Ronnie Bullock   QIO:[id number] DOB: [birth year]  DOA: 03/21/2024 Date of Service: 03/23/24 which is hospital day 0  PCP: [name], DO    Hospital course / significant events:   HPI: Ronnie Bullock is a 78 y.o. 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  He had no injury related to the falls. Patient goes to adult daycare who called EMS to take him to the ED after they noticed that he was unable to participate as he usually does as his right leg kept giving away. Patient is unable to contribute to history due to dementia   05/15: CT head nonacute. admitted to hospitalist for CVA workup. MRI showing Small focus of acute ischemia within the posterior left frontal lobe, within the ACA territory.  05/16: PT/OT recs for SNF. Neuro consult. Echo pending. CTA H/N pending.  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 However, Ddx for rare causes would include neurosyphilis, FMD, mycotic vessel changes .  PHASES score for the aneurysm is 2, corresponding to a 0.4% 5-year risk of aneurysm rupture.  Permissive hypertension for  first 24-48 hrs post stroke onset: Prn Labetalol IV or Vasotec IV If BP greater than 220/120  Statins w/ LDL goal less than 70 Continue Eliquis . No ASA/Plavix. If Eliquis  d/c will need ASA  RPR, consider ID consult  Risk of surgery outweighs benefits --> no surgical intervention Per neurology, can consider fungal blood culture +/- ID consult    Essential hypertension, benign Hold home antihypertensives (amlodipine ) for permissive hypertension   Chronic anticoagulation History of DVT LLE 2022 Continue Eliquis , however low risk DVT and high risk falls will d/w daughter re: continuation of this If d/c will need ASA as above for CVA    History of prostate cancer 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  Underweight - under 18  overweight - 25 to 29 obese - 30 or more Class 1 obesity: BMI of 30.0 to 34 Class 2 obesity: BMI of 35.0 to 39 Class 3 obesity: BMI of 40.0 to 49 Super Morbid Obesity: BMI 50-59 Super-super Morbid Obesity: BMI 60+ Healthy nutrition and physical activity advised as adjunct to other disease management and risk reduction treatments    DVT prophylaxis: Eliquis   IV fluids: no continuous IV fluids  Nutrition: regular diet  Central lines / other devices: none  Code Status: FULL CODE ACP documentation reviewed:  none on file  in [facility]  TOC needs: SNF placement  Medical barriers to dispo: neuro w/u for CVA - pending RPR, possible ID consult based on results. Expected medical readiness for discharge tomorrow.          Subjective / Brief ROS:  Patient reports no concerns orhter than RLE weakness He is pleasantly confused, alert Per RN, overnight there was some  concern for blurred vision but at this poitn pt notes vision is normal and never had any issue with this  Denies CP/SOB.  Pain controlled.  Tolerating diet per SLP and RN.   Family Communication: call to sister 03/23/24 4:35 PM all questions answered      Objective Findings:  Vitals:   03/23/24 0046 03/23/24 0329 03/23/24 0750 03/23/24 0822  BP: (!) 132/92 (!) 130/92 (!) 133/98 123/88  Pulse: 87 80 86 84  Resp: 16 16 20 18   Temp: 98.4 F (36.9 C) 98.6 F (37 C) 98.6 F (37 C) 98.6 F (37 C)  TempSrc: Oral  Oral   SpO2: 98% 100% 100% 98%  Weight:      Height:        Intake/Output Summary (Last 24 hours) at 03/23/2024 1059 Last data filed at 03/23/2024 1036 Gross per 24 hour  Intake 360 ml  Output 650 ml  Net -290 ml   Filed Weights   03/21/24 1647 03/21/24 1649  Weight: 68 kg 68 kg    Examination:  Physical Exam Constitutional:      General: He is not in acute distress. Cardiovascular:     Rate and Rhythm: Normal rate and regular rhythm.  Pulmonary:     Effort: Pulmonary effort is normal. No respiratory distress.     Breath sounds: Normal breath sounds.  Abdominal:     General: Abdomen is flat.     Palpations: Abdomen is soft.  Musculoskeletal:     Right lower leg: No edema.     Left lower leg: No edema.  Skin:    General: Skin is warm and dry.  Neurological:     Mental Status: He is alert. He is disoriented.     Motor: Weakness (RLE) present.  Psychiatric:        Mood and Affect: Mood normal.        Behavior: Behavior normal.          Scheduled Medications:    stroke: early stages of recovery book   Does not apply Once   apixaban   5 mg Oral BID   donepezil   5 mg Oral QHS   tamsulosin   0.8 mg Oral Daily    Continuous Infusions:   PRN Medications:  acetaminophen  **OR** acetaminophen  (TYLENOL ) oral liquid 160 mg/5 mL **OR** acetaminophen   Antimicrobials from admission:  Anti-infectives (From admission, onward)    None            Data Reviewed:  I have personally reviewed the following...  CBC: Recent Labs  Lab 03/21/24 1651  WBC 5.0  NEUTROABS 2.4  HGB 13.0  HCT 39.6  MCV 91.2  PLT 210   Basic Metabolic Panel: Recent Labs  Lab 03/21/24 1651  NA 137  K 4.0  CL 105  CO2 23  GLUCOSE 141*  BUN 19  CREATININE 1.15  CALCIUM  8.9   GFR: Estimated Creatinine Clearance: 47 mL/min (by C-G formula based on SCr of 1.15 mg/dL). Liver Function Tests: Recent Labs  Lab 03/21/24 1651  AST 20  ALT 10  ALKPHOS 68  BILITOT 0.8  PROT 7.5  ALBUMIN 3.1*   No results for input(s): "LIPASE", "AMYLASE" in the last 168 hours. No results for input(s): "AMMONIA" in the last 168 hours. Coagulation Profile: Recent Labs  Lab 03/21/24 1651  INR 1.1   Cardiac Enzymes: No results for input(s): "CKTOTAL", "CKMB", "CKMBINDEX", "TROPONINI" in the last 168 hours. BNP (last 3 results) No results for input(s): "PROBNP" in the last 8760 hours. HbA1C: Recent Labs    03/21/24 1651  HGBA1C 6.3*   CBG: No results for input(s): "GLUCAP" in the last 168 hours. Lipid Profile: Recent Labs    03/22/24 0445  CHOL 136  HDL 50  LDLCALC 78  TRIG 42  CHOLHDL 2.7   Thyroid  Function Tests: No results for input(s): "TSH", "T4TOTAL", "FREET4", "T3FREE", "THYROIDAB" in the last 72 hours. Anemia Panel: No results for input(s): "VITAMINB12", "FOLATE", "FERRITIN", "TIBC", "IRON", "RETICCTPCT" in the last 72 hours. Most Recent Urinalysis On File:     Component Value Date/Time   COLORURINE YELLOW (A) 02/12/2021 1658   APPEARANCEUR Clear 11/06/2023 1056   LABSPEC 1.017 02/12/2021 1658   PHURINE 5.0 02/12/2021 1658   GLUCOSEU Negative 11/06/2023 1056   HGBUR NEGATIVE 02/12/2021 1658   BILIRUBINUR Negative 11/06/2023 1056   KETONESUR NEGATIVE 02/12/2021 1658   PROTEINUR 3+ (A) 11/06/2023 1056   PROTEINUR 100 (A) 02/12/2021 1658   UROBILINOGEN 0.2 10/30/2020 1403   NITRITE Negative 11/06/2023 1056   NITRITE  NEGATIVE 02/12/2021 1658   LEUKOCYTESUR Negative 11/06/2023 1056   LEUKOCYTESUR NEGATIVE 02/12/2021 1658   Sepsis Labs: @LABRCNTIP (procalcitonin:4,lacticidven:4) Microbiology: No results found for this or any previous visit (from the past 240 hours).    Radiology Studies last 3 days: CT ANGIO HEAD NECK W WO CM Result Date: 03/23/2024 CLINICAL DATA:  78 year old male with right side weakness. Small left superior motor cortex infarcts, possibly distal left ACA territory. EXAM: CT ANGIOGRAPHY HEAD AND NECK WITH AND WITHOUT CONTRAST TECHNIQUE: Multidetector CT imaging of the head and neck was performed using the standard protocol during bolus administration of intravenous contrast. Multiplanar CT image reconstructions and MIPs were obtained to evaluate the vascular anatomy. Carotid stenosis measurements (when applicable) are obtained utilizing NASCET criteria, using the distal internal carotid diameter as the denominator. RADIATION DOSE REDUCTION: This exam was performed according to the departmental dose-optimization program which includes automated exposure control, adjustment of the mA and/or kV according to patient size and/or use of iterative reconstruction technique. CONTRAST:  75mL OMNIPAQUE  IOHEXOL  350 MG/ML SOLN COMPARISON:  Brain MRI and Head CT 03/21/2024. FINDINGS: CT HEAD Brain: Left superior motor strip infarct is subtle by CT (series 5, image 24). Other chronically advanced left greater than right cerebral white matter hypodensity, left frontal lobe encephalomalacia with ex vacuo enlargement is stable. Stable chronic left cerebellar linear infarct. No acute intracranial hemorrhage identified. No midline shift, mass effect, or evidence of intracranial mass lesion. No new cortically based infarct identified. Calvarium and skull base: Intact. No acute osseous abnormality identified. Paranasal sinuses: Paranasal sinus disease is stable. Tympanic cavities and mastoids remain well aerated. Orbits:  No acute orbit or scalp soft tissue finding. CTA NECK Skeleton: Absent dentition. Cervical spine degeneration. No acute osseous abnormality identified. Upper chest: Dependent atelectasis and/or trace pleural effusions. Negative visible superior mediastinum. Other neck: Retained secretions in the hypopharynx, subglottic larynx and trachea at the thoracic inlet. Other nonvascular neck soft tissue spaces appear negative. Aortic arch: Tortuous aortic arch. Four vessel arch with separate origin of the left vertebral artery. Right carotid system: Tortuous brachiocephalic artery without plaque  or stenosis. Tortuous right CCA. Minimal plaque at the right carotid bifurcation. No stenosis. Left carotid system: Similar tortuosity with no significant plaque or stenosis. A portion of the proximal left CCA is obscured from dense left subclavian venous contrast streak artifact. Vertebral arteries: Mildly tortuous proximal right subclavian artery and cervical right vertebral artery with no plaque or stenosis. Non dominant left vertebral artery arises directly from the arch and has a late entry into the cervical transverse foramen. It is diminutive but remains patent to the skull base with no plaque or stenosis identified. CTA HEAD Posterior circulation: Dominant right vertebral artery. Patent vertebrobasilar junction. No distal vertebral plaque or stenosis. Non dominant left vertebral nearly terminates in PICA. Normal PICA origins. Patent basilar artery with tortuosity. Tortuous bilateral fetal type PCA origins. Basilar tip and SCA origins are patent. The basilar tip is mildly tortuous but there is no convincing aneurysm. Bilateral PCA branches are within normal limits. Anterior circulation: Both ICA siphons are patent. Left siphon moderate calcified plaque with mild supraclinoid stenosis. Normal left posterior communicating artery origin. Right siphon mild to moderate calcified plaque, no significant siphon stenosis. Normal right  posterior communicating artery origin. Patent carotid termini, MCA and ACA origins. Tortuous A1 segments and anterior communicating artery. Tortuous ACA A2 branches and there are 2 different segmental areas of fusiform and beaded enlargement of the left ACA in both the proximal A2 and A2/A3 junctions (series 16, image 26. The more distal of these has a saccular component projecting to the midline measuring 3-4 mm as seen on series 10, image 159 and series 15, image 21. And distal to both abnormal segments the left ACA is stenotic but not occluded (series 16, image 26). The right ACA branches appear more normal. Left MCA M1 and bifurcation are tortuous, patent. However, there is moderate to severe stenosis of the left MCA anterior M2 branch best seen on series 102, image 200. No right MCA branch occlusion identified. Right MCA M1 segment and bifurcation are tortuous without stenosis. Right MCA branches are within normal limits. Venous sinuses: Early contrast timing, not evaluated. Anatomic variants: Non dominant left vertebral artery arises from the aorta, nearly terminates in PICA. Fetal type bilateral PCA origins. Review of the MIP images confirms the above findings IMPRESSION: 1. Negative for large vessel occlusion. 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 This might be an unusual presentation of Fibromuscular Dysplasia (FMD). Mycotic vessel changes are also a consideration. 2. 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 Bilateral ICA siphon calcified plaque but only mild associated siphon stenosis. 3. No significant extracranial atherosclerosis or stenosis. Generalized arterial tortuosity in the head and neck. 4. Expected subtle CT  appearance of the superior left perirolandic small infarct. No associated hemorrhage or mass effect. Electronically Signed   By: Marlise Simpers M.D.   On: 03/23/2024 09:20   MR BRAIN WO CONTRAST Result Date: 03/21/2024 CLINICAL DATA:  Right-sided weakness EXAM: MRI HEAD WITHOUT CONTRAST TECHNIQUE: Multiplanar, multiecho pulse sequences of the brain and surrounding structures were obtained without intravenous contrast. COMPARISON:  None Available. FINDINGS: Brain: Small focus of acute ischemia within the posterior left frontal lobe, within the ACA territory. Multifocal chronic microhemorrhage in the left temporal lobe. There is confluent hyperintense T2-weighted  signal within the white matter. Generalized volume loss. Old left frontal and left cerebellar infarcts. The midline structures are normal. Vascular: Normal flow voids. Skull and upper cervical spine: Normal calvarium and skull base. Visualized upper cervical spine and soft tissues are normal. Sinuses/Orbits:Right ostiomeatal pattern sinusitis.  Normal orbits. IMPRESSION: 1. Small focus of acute ischemia within the posterior left frontal lobe, within the ACA territory. No acute hemorrhage or mass effect. 2. Old left frontal and left cerebellar infarcts. 3. Right ostiomeatal complex pattern sinusitis. Electronically Signed   By: Juanetta Nordmann M.D.   On: 03/21/2024 21:58   CT HEAD WO CONTRAST Result Date: 03/21/2024 CLINICAL DATA:  Right leg numbness EXAM: CT HEAD WITHOUT CONTRAST TECHNIQUE: Contiguous axial images were obtained from the base of the skull through the vertex without intravenous contrast. RADIATION DOSE REDUCTION: This exam was performed according to the departmental dose-optimization program which includes automated exposure control, adjustment of the mA and/or kV according to patient size and/or use of iterative reconstruction technique. COMPARISON:  CT 02/12/2021 FINDINGS: Brain: No acute territorial infarction, hemorrhage or intracranial mass.  Atrophy and moderate severe chronic small vessel ischemic changes of the white matter. Stable ventricle size and morphology. 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  Carotid vascular calcification Skull: No fracture Sinuses/Orbits: Moderate mucosal thickening in the maxillary sinuses, ethmoid sinuses and right frontal sinus. Other: None IMPRESSION: 1. No CT evidence for acute intracranial abnormality. 2. Atrophy and chronic small vessel ischemic changes of the white matter. Small chronic infarct in the left cerebellum. Electronically Signed   By: Esmeralda Hedge M.D.   On: 03/21/2024 18:27         Melodi Sprung, DO [facility] 03/23/2024, 10:59 AM    Dictation software may have been used to generate the above note. Typos may occur and escape review in typed/dictated notes. Please contact Dr Authur Leghorn directly for clarity if needed.  Staff may message me via secure chat in Epic  but this may not receive an immediate response,  please page me for urgent matters!  If 7PM-7AM, please contact night coverage [email or web address]

## 2024-03-23 NOTE — Plan of Care (Signed)
 Thorough education completed at bedside with patient and family.- explaining difference between TIA and CVA.  Also discussed what types of things they may see from a future stroke (LF/RT deficits or even speech, thought processing, word formation, AMS changes.  Emphasized importance of calling 911 soon, because  if arrives at hospital within 3-4 hrs of symptoms, may be able to receive TNK medication to hopefully assist with minimalizing long-lasting residual deficits from the stroke.  In short, "Time = brain loss".

## 2024-03-23 NOTE — Progress Notes (Signed)
 Ronnie Bullock APP notified by secure chat of change in patient's vision from  one eye being blurry occasionally to both eyes having blurry vision.

## 2024-03-23 NOTE — Progress Notes (Signed)
 Mobility Specialist - Progress Note     03/23/24 1358  Mobility  Activity Ambulated with assistance in hallway  Level of Assistance Standby assist, set-up cues, supervision of patient - no hands on  Assistive Device Front wheel walker  Distance Ambulated (ft) 85 ft  Range of Motion/Exercises Active  Activity Response Tolerated well  Mobility Referral Yes  Mobility visit 1 Mobility  Mobility Specialist Start Time (ACUTE ONLY) 1247  Mobility Specialist Stop Time (ACUTE ONLY) 1301  Mobility Specialist Time Calculation (min) (ACUTE ONLY) 14 min   Pt resting in recliner on RA upon entry. Pt STS x4 during hygiene clean SBA to RW. Pt then ambulates to hallway around NS. Pt given verbal cuing to stay close to walker to prevent fall. Pt maintains upright walking mechanics with a lift on left leg and foot drag on right. Pt returned to recliner and left with needs in reach. Chair alarm activated.   [name] Mobility Specialist 03/23/24, 2:13 PM

## 2024-03-23 NOTE — Progress Notes (Addendum)
 Physical Therapy Treatment Patient Details Name: Ronnie Bullock MRN: [id number] DOB: 1946-06-05 Today's Date: 03/23/2024   History of Present Illness Pt is a 78 y.o. male being admitted with RLE weakness resulting in 6 falls. MRI brain: Small focus of acute ischemia within the posterior left frontal lobe, within the ACA territory. 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    PT Comments  Pt was seated EOB with RN tech in room assisting, upon arrival. Pt is alert and slightly impulsively needing to go to BR. He had returned from CT scan soiled. CT results read and updated neurologist note read prior to session. Pt was able to stand and ambulate with RW to/from BR however requires constant assistance and vcs to improve gait quality and slow down for safety. Pt remains unsteady even with BUE support. Post session, Bolivar Bushman called pt's sister to discuss PLOF. Pt did not use AD (occasionally a SPC but normally nothing) to ambulate. Per sister, he lives with his brother who also requires assistance due to his own personal illnesses. Mathan goes to adult daycare 5 x a week and has private hired caregiver who happens to be his Programmer[employer], assisting him in the evening. Per pt's sister, pt started to go to adult daycare due to found wondering in community." He would walk all over town and did not need a cane or walker."  Due to current change in mobility/safety, Bolivar Bushman feels pt will benefit from rehab to improve safety with all ADLs prior to returning home.    If plan is discharge home, recommend the following: A little help with walking and/or transfers;A little help with bathing/dressing/bathroom     Equipment Recommendations  Other (comment) (Defer to next level of care)       Precautions / Restrictions Precautions Precautions: Fall Recall of Precautions/Restrictions: Intact Restrictions Weight Bearing Restrictions Per Provider Order: No      Mobility  Bed Mobility  General bed mobility comments: Pt was seated EOB with RN tech present. pt had incontience episode while down in CT this morning. He is requestyed to use BR.    Transfers Overall transfer level: Needs assistance Equipment used: Rolling walker (2 wheels) Transfers: Sit to/from Stand Sit to Stand: Min assist  General transfer comment: Vcs for technique, sequencing and safety. Min assistance to stand from lowest bed height and from toilet surface    Ambulation/Gait Ambulation/Gait assistance: Min assist Gait Distance (Feet): 20 Feet Assistive device: Rolling walker (2 wheels) Gait Pattern/deviations: Step-through pattern, Trunk flexed, Staggering left, Staggering right, Decreased stride length Gait velocity: decreased  General Gait Details: pt was able to ambulate to/from BR however several occasions of needing assistance to prevent falling. pt is slightly impulsive. RLE externally rotated + poor dorsifexion and tends to drag on floor. With vcs, pt is able to minimally correct but unable to maintain.   Balance Overall balance assessment: Needs assistance Sitting-balance support: No upper extremity supported, Feet supported Sitting balance-Leahy Scale: Good     Standing balance support: Bilateral upper extremity supported, During functional activity, Reliant on assistive device for balance Standing balance-Leahy Scale: Poor Standing balance comment: Pt remains at high risk of falls     Communication Communication Communication: Impaired Factors Affecting Communication: Difficulty expressing self;Reduced clarity of speech  Cognition Arousal: Alert Behavior During Therapy: WFL for tasks assessed/performed   PT - Cognitive impairments: History of cognitive impairments      PT - Cognition Comments: Pt is A and O  to self however only truely oriented x 2. Does follow simple verbal commands, pleasant and cooperative; moderate word-finding  difficulties/expressive aphasia at times, often defaulting to "yes, yep" poor overall awareness of situation and deficits Following commands: Intact      Cueing Cueing Techniques: Verbal cues         Pertinent Vitals/Pain Pain Assessment Pain Assessment: No/denies pain     PT Goals (current goals can now be found in the care plan section) Acute Rehab PT Goals Patient Stated Goal: none stated however pt is agreebler to rehab at DC Progress towards PT goals: Progressing toward goals    Frequency    Min 3X/week       AM-PAC PT "6 Clicks" Mobility   Outcome Measure  Help needed turning from your back to your side while in a flat bed without using bedrails?: A Little Help needed moving from lying on your back to sitting on the side of a flat bed without using bedrails?: A Little Help needed moving to and from a bed to a chair (including a wheelchair)?: A Little Help needed standing up from a chair using your arms (e.g., wheelchair or bedside chair)?: A Little Help needed to walk in hospital room?: A Lot Help needed climbing 3-5 steps with a railing? : A Lot 6 Click Score: 16    End of Session   Activity Tolerance: Patient tolerated treatment well Patient left: in chair;with call bell/phone within reach Nurse Communication: Mobility status PT Visit Diagnosis: Hemiplegia and hemiparesis Hemiplegia - Right/Left: Right Hemiplegia - dominant/non-dominant: Dominant Hemiplegia - caused by: Cerebral infarction     Time: 4132-4401 PT Time Calculation (min) (ACUTE ONLY): 30 min  Charges:    $Gait Training: 8-22 mins $Neuromuscular Re-education: 8-22 mins PT General Charges $$ ACUTE PT VISIT: 1 Visit                    Chester Costa PTA 03/23/24, 10:31 AM

## 2024-03-24 DIAGNOSIS — I639 Cerebral infarction, unspecified: Secondary | ICD-10-CM | POA: Diagnosis not present

## 2024-03-24 LAB — RPR: RPR Ser Ql: NONREACTIVE

## 2024-03-24 NOTE — Progress Notes (Signed)
 PROGRESS NOTE    Ronnie Bullock   YNW:[id number] DOB: [birth year]  DOA: 03/21/2024 Date of Service: 03/24/24 which is hospital day 0  PCP: [name], DO    Hospital course / significant events:   HPI: Ronnie Bullock is a 78 y.o. 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  He had no injury related to the falls. Patient goes to adult daycare who called EMS to take him to the ED after they noticed that he was unable to participate as he usually does as his right leg kept giving away. Patient is unable to contribute to history due to dementia   05/15: CT head nonacute. admitted to hospitalist for CVA workup. MRI showing Small focus of acute ischemia within the posterior left frontal lobe, within the ACA territory.  05/16: PT/OT recs for SNF. Neuro consult. Echo pending. CTA H/N pending.  05/17: abd CTA H/N w/ L ACA abn liekly atherosclerotis but cannot r/o neurosyphilis / fibromuscular dysplasia, also saccular aneurysm - pending RPR 05/18: RPR and HIV NR. 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 However, Ddx for rare causes would include neurosyphilis, FMD, mycotic vessel changes .  PHASES score for the aneurysm is 2, corresponding to a 0.4% 5-year risk of aneurysm rupture.  Permissive hypertension window  has passed but BP has been at goal, continue monitor VS routinely Statins w/ LDL goal less than 70 Continue Eliquis . No ASA/Plavix. If Eliquis  were to be d/c will need ASA  RPR and HIV NR. Fungal infectious seems improbable given atherosclerotic disease more likely and no other clinical suspicion for fungemia, will not get fungal culture at this time  Risk of aneurysm surgery outweighs benefits --> no surgical intervention  In patients with an ischemic stroke or TIA attributed to FMD, secondary prevention measures include antiplatelet therapy, blood pressure control, and lifestyle modification which is all in progress, of other concerns for fibromuscular dysplasia would work this up outpatient    Essential hypertension, benign Hold home antihypertensives (amlodipine ) for permissive hypertension   Chronic anticoagulation History of DVT LLE 2022 Continue Eliquis , however low risk DVT and high risk falls will d/w daughter re: continuation of this If d/c will need ASA as above for CVA    History of prostate cancer 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  Underweight - under 18  overweight - 25 to 29 obese - 30 or more Class 1 obesity: BMI of 30.0 to 34 Class 2 obesity: BMI of 35.0 to 39 Class 3 obesity: BMI of 40.0 to 49 Super Morbid Obesity: BMI 50-59 Super-super Morbid Obesity:  BMI 60+ Healthy nutrition and physical activity advised as adjunct to other disease management and risk reduction treatments    DVT prophylaxis: Eliquis   IV fluids: no continuous IV fluids  Nutrition: regular diet  Central lines / other devices: none  Code Status: FULL CODE ACP documentation reviewed:  none on  file in [facility]  TOC needs: SNF placement  Medical barriers to dispo: none at this time          Subjective / Brief ROS:  Patient reports no concerns orhter than RLE weakness He is pleasantly confused, alert No complaints  Denies CP/SOB.  Pain controlled.  Tolerating diet per SLP and RN.   Family Communication: call to sister yesterday, no new updates today, awaiting SNF placement will update as needed / as requested      Objective Findings:  Vitals:   03/23/24 1934 03/24/24 0000 03/24/24 0740 03/24/24 1156  BP: (!) 129/99 136/85 (!) 133/97 133/76  Pulse: 89 81 96 95  Resp: 16 16 20 20   Temp: 98.5 F (36.9 C) 98.6 F (37 C) 98.6 F (37 C) 98.6 F (37 C)  TempSrc: Oral Oral Oral Oral  SpO2: 95% 96% 90% 96%  Weight:      Height:        Intake/Output Summary (Last 24 hours) at 03/24/2024 1256 Last data filed at 03/24/2024 1245 Gross per 24 hour  Intake 240 ml  Output 400 ml  Net -160 ml   Filed Weights   03/21/24 1647 03/21/24 1649  Weight: 68 kg 68 kg    Examination:  Physical Exam Constitutional:      General: He is not in acute distress. Cardiovascular:     Rate and Rhythm: Normal rate and regular rhythm.  Pulmonary:     Effort: Pulmonary effort is normal. No respiratory distress.     Breath sounds: Normal breath sounds.  Abdominal:     General: Abdomen is flat.     Palpations: Abdomen is soft.  Musculoskeletal:     Right lower leg: No edema.     Left lower leg: No edema.  Skin:    General: Skin is warm and dry.  Neurological:     Mental Status: He is alert. He is disoriented.     Motor: Weakness (RLE) present.  Psychiatric:        Mood and Affect: Mood normal.        Behavior: Behavior normal.          Scheduled Medications:    stroke: early stages of recovery book   Does not apply Once   apixaban   5 mg Oral BID   donepezil   5 mg Oral QHS   tamsulosin   0.8 mg Oral Daily    Continuous Infusions:   PRN Medications:   acetaminophen  **OR** acetaminophen  (TYLENOL ) oral liquid 160 mg/5 mL **OR** acetaminophen   Antimicrobials from admission:  Anti-infectives (From admission, onward)    None           Data Reviewed:  I have personally reviewed the following...  CBC: Recent Labs  Lab 03/21/24 1651  WBC 5.0  NEUTROABS 2.4  HGB 13.0  HCT 39.6  MCV 91.2  PLT 210   Basic Metabolic Panel: Recent Labs  Lab 03/21/24 1651  NA 137  K 4.0  CL 105  CO2 23  GLUCOSE 141*  BUN 19  CREATININE 1.15  CALCIUM  8.9   GFR: Estimated Creatinine Clearance: 47 mL/min (by C-G formula based on SCr of 1.15 mg/dL). Liver Function  Tests: Recent Labs  Lab 03/21/24 1651  AST 20  ALT 10  ALKPHOS 68  BILITOT 0.8  PROT 7.5  ALBUMIN 3.1*   No results for input(s): "LIPASE", "AMYLASE" in the last 168 hours. No results for input(s): "AMMONIA" in the last 168 hours. Coagulation Profile: Recent Labs  Lab 03/21/24 1651  INR 1.1   Cardiac Enzymes: No results for input(s): "CKTOTAL", "CKMB", "CKMBINDEX", "TROPONINI" in the last 168 hours. BNP (last 3 results) No results for input(s): "PROBNP" in the last 8760 hours. HbA1C: Recent Labs    03/21/24 1651  HGBA1C 6.3*   CBG: No results for input(s): "GLUCAP" in the last 168 hours. Lipid Profile: Recent Labs    03/22/24 0445  CHOL 136  HDL 50  LDLCALC 78  TRIG 42  CHOLHDL 2.7   Thyroid  Function Tests: No results for input(s): "TSH", "T4TOTAL", "FREET4", "T3FREE", "THYROIDAB" in the last 72 hours. Anemia Panel: No results for input(s): "VITAMINB12", "FOLATE", "FERRITIN", "TIBC", "IRON", "RETICCTPCT" in the last 72 hours. Most Recent Urinalysis On File:     Component Value Date/Time   COLORURINE YELLOW (A) 02/12/2021 1658   APPEARANCEUR Clear 11/06/2023 1056   LABSPEC 1.017 02/12/2021 1658   PHURINE 5.0 02/12/2021 1658   GLUCOSEU Negative 11/06/2023 1056   HGBUR NEGATIVE 02/12/2021 1658   BILIRUBINUR Negative 11/06/2023 1056    KETONESUR NEGATIVE 02/12/2021 1658   PROTEINUR 3+ (A) 11/06/2023 1056   PROTEINUR 100 (A) 02/12/2021 1658   UROBILINOGEN 0.2 10/30/2020 1403   NITRITE Negative 11/06/2023 1056   NITRITE NEGATIVE 02/12/2021 1658   LEUKOCYTESUR Negative 11/06/2023 1056   LEUKOCYTESUR NEGATIVE 02/12/2021 1658   Sepsis Labs: @LABRCNTIP (procalcitonin:4,lacticidven:4) Microbiology: No results found for this or any previous visit (from the past 240 hours).    Radiology Studies last 3 days: ECHOCARDIOGRAM COMPLETE Result Date: 03/23/2024    ECHOCARDIOGRAM REPORT   Patient Name:   MALIIK KARNER Date of Exam: 03/22/2024 Medical Rec #:  [id number]        Height:       66.0 in Accession #:    [id number]       Weight:       150.0 lb Date of Birth:  Oct 14, 1946        BSA:          1.770 m Patient Age:    78 years         BP:           137/105 mmHg Patient Gender: M                HR:           88 bpm. Exam Location:  [facility] Procedure: 2D Echo, Cardiac Doppler and Color Doppler (Both Spectral and Color            Flow Doppler were utilized during procedure). Indications:     Stroke I63.9  History:         Patient has prior history of Echocardiogram examinations, most                  recent 02/14/2021. CAD, Stroke; Risk Factors:Hypertension.  Sonographer:     Terrilee Few RCS Referring Phys:  [id number] Lanetta Pion Diagnosing Phys: Jackquelyn Mass MD IMPRESSIONS  1. Left ventricular ejection fraction, by estimation, is 65 to 70%. The left ventricle has normal function. The left ventricle has no regional wall motion abnormalities. Left ventricular diastolic parameters are consistent with Grade I diastolic dysfunction (impaired relaxation).  2. Right ventricular systolic function is normal. The right ventricular size is normal. There is normal pulmonary artery systolic pressure. The estimated right ventricular systolic pressure is 23.6 mmHg.  3. The mitral valve is grossly normal. Trivial mitral valve regurgitation. No evidence of  mitral stenosis.  4. The aortic valve is tricuspid. Aortic valve regurgitation is not visualized. No aortic stenosis is present.  5. The inferior vena cava is normal in size with greater than 50% respiratory variability, suggesting right atrial pressure of 3 mmHg. Comparison(s): No significant change from prior study. Conclusion(s)/Recommendation(s): No intracardiac source of embolism detected on this transthoracic study. Consider a transesophageal echocardiogram to exclude cardiac source of embolism if clinically indicated. FINDINGS  Left Ventricle: Left ventricular ejection fraction, by estimation, is 65 to 70%. The left ventricle has normal function. The left ventricle has no regional wall motion abnormalities. The left ventricular internal cavity size was normal in size. There is  no left ventricular hypertrophy. Left ventricular diastolic parameters are consistent with Grade I diastolic dysfunction (impaired relaxation). Right Ventricle: The right ventricular size is normal. No increase in right ventricular wall thickness. Right ventricular systolic function is normal. There is normal pulmonary artery systolic pressure. The tricuspid regurgitant velocity is 2.27 m/s, and  with an assumed right atrial pressure of 3 mmHg, the estimated right ventricular systolic pressure is 23.6 mmHg. Left Atrium: Left atrial size was normal in size. Right Atrium: Right atrial size was normal in size. Pericardium: There is no evidence of pericardial effusion. Mitral Valve: The mitral valve is grossly normal. Trivial mitral valve regurgitation. No evidence of mitral valve stenosis. Tricuspid Valve: The tricuspid valve is grossly normal. Tricuspid valve regurgitation is trivial. No evidence of tricuspid stenosis. Aortic Valve: The aortic valve is tricuspid. Aortic valve regurgitation is not visualized. No aortic stenosis is present. Aortic valve peak gradient measures 5.9 mmHg. Pulmonic Valve: The pulmonic valve was grossly normal.  Pulmonic valve regurgitation is mild. No evidence of pulmonic stenosis. Aorta: The aortic root and ascending aorta are structurally normal, with no evidence of dilitation. Venous: The inferior vena cava is normal in size with greater than 50% respiratory variability, suggesting right atrial pressure of 3 mmHg. IAS/Shunts: The atrial septum is grossly normal.  LEFT VENTRICLE PLAX 2D LVIDd:         3.20 cm   Diastology LVIDs:         1.90 cm   LV e' medial:    7.18 cm/s LV PW:         1.10 cm   LV E/e' medial:  8.5 LV IVS:        0.90 cm   LV e' lateral:   11.90 cm/s LVOT diam:     2.30 cm   LV E/e' lateral: 5.1 LV SV:         56 LV SV Index:   32 LVOT Area:     4.15 cm  RIGHT VENTRICLE             IVC RV S prime:     10.30 cm/s  IVC diam: 1.50 cm TAPSE (M-mode): 2.5 cm LEFT ATRIUM             Index        RIGHT ATRIUM           Index LA diam:        2.40 cm 1.36 cm/m   RA Area:     17.40 cm LA Vol (A2C):   23.3 ml  13.17 ml/m  RA Volume:   53.10 ml  30.01 ml/m LA Vol (A4C):   28.3 ml 15.99 ml/m LA Biplane Vol: 25.6 ml 14.47 ml/m  AORTIC VALVE                 PULMONIC VALVE AV Area (Vmax): 3.01 cm     PR End Diast Vel: 7.29 msec AV Vmax:        121.00 cm/s AV Peak Grad:   5.9 mmHg LVOT Vmax:      87.53 cm/s LVOT Vmean:     55.033 cm/s LVOT VTI:       0.135 m  AORTA Ao Root diam: 3.20 cm Ao Asc diam:  3.60 cm MITRAL VALVE               TRICUSPID VALVE MV Area (PHT): 4.06 cm    TR Peak grad:   20.6 mmHg MV Decel Time: 187 msec    TR Vmax:        227.00 cm/s MV E velocity: 60.80 cm/s MV A velocity: 94.30 cm/s  SHUNTS MV E/A ratio:  0.64        Systemic VTI:  0.13 m                            Systemic Diam: 2.30 cm Jackquelyn Mass MD Electronically signed by Jackquelyn Mass MD Signature Date/Time: 03/23/2024/2:31:09 PM    Final    CT ANGIO HEAD NECK W WO CM Result Date: 03/23/2024 CLINICAL DATA:  78 year old male with right side weakness. Small left superior motor cortex infarcts, possibly distal left ACA territory.  EXAM: CT ANGIOGRAPHY HEAD AND NECK WITH AND WITHOUT CONTRAST TECHNIQUE: Multidetector CT imaging of the head and neck was performed using the standard protocol during bolus administration of intravenous contrast. Multiplanar CT image reconstructions and MIPs were obtained to evaluate the vascular anatomy. Carotid stenosis measurements (when applicable) are obtained utilizing NASCET criteria, using the distal internal carotid diameter as the denominator. RADIATION DOSE REDUCTION: This exam was performed according to the departmental dose-optimization program which includes automated exposure control, adjustment of the mA and/or kV according to patient size and/or use of iterative reconstruction technique. CONTRAST:  75mL OMNIPAQUE  IOHEXOL  350 MG/ML SOLN COMPARISON:  Brain MRI and Head CT 03/21/2024. FINDINGS: CT HEAD Brain: Left superior motor strip infarct is subtle by CT (series 5, image 24). Other chronically advanced left greater than right cerebral white matter hypodensity, left frontal lobe encephalomalacia with ex vacuo enlargement is stable. Stable chronic left cerebellar linear infarct. No acute intracranial hemorrhage identified. No midline shift, mass effect, or evidence of intracranial mass lesion. No new cortically based infarct identified. Calvarium and skull base: Intact. No acute osseous abnormality identified. Paranasal sinuses: Paranasal sinus disease is stable. Tympanic cavities and mastoids remain well aerated. Orbits: No acute orbit or scalp soft tissue finding. CTA NECK Skeleton: Absent dentition. Cervical spine degeneration. No acute osseous abnormality identified. Upper chest: Dependent atelectasis and/or trace pleural effusions. Negative visible superior mediastinum. Other neck: Retained secretions in the hypopharynx, subglottic larynx and trachea at the thoracic inlet. Other nonvascular neck soft tissue spaces appear negative. Aortic arch: Tortuous aortic arch. Four vessel arch with separate  origin of the left vertebral artery. Right carotid system: Tortuous brachiocephalic artery without plaque or stenosis. Tortuous right CCA. Minimal plaque at the right carotid bifurcation. No stenosis. Left carotid system: Similar tortuosity with no significant plaque or stenosis. A portion of the proximal  left CCA is obscured from dense left subclavian venous contrast streak artifact. Vertebral arteries: Mildly tortuous proximal right subclavian artery and cervical right vertebral artery with no plaque or stenosis. Non dominant left vertebral artery arises directly from the arch and has a late entry into the cervical transverse foramen. It is diminutive but remains patent to the skull base with no plaque or stenosis identified. CTA HEAD Posterior circulation: Dominant right vertebral artery. Patent vertebrobasilar junction. No distal vertebral plaque or stenosis. Non dominant left vertebral nearly terminates in PICA. Normal PICA origins. Patent basilar artery with tortuosity. Tortuous bilateral fetal type PCA origins. Basilar tip and SCA origins are patent. The basilar tip is mildly tortuous but there is no convincing aneurysm. Bilateral PCA branches are within normal limits. Anterior circulation: Both ICA siphons are patent. Left siphon moderate calcified plaque with mild supraclinoid stenosis. Normal left posterior communicating artery origin. Right siphon mild to moderate calcified plaque, no significant siphon stenosis. Normal right posterior communicating artery origin. Patent carotid termini, MCA and ACA origins. Tortuous A1 segments and anterior communicating artery. Tortuous ACA A2 branches and there are 2 different segmental areas of fusiform and beaded enlargement of the left ACA in both the proximal A2 and A2/A3 junctions (series 16, image 26. The more distal of these has a saccular component projecting to the midline measuring 3-4 mm as seen on series 10, image 159 and series 15, image 21. And distal to  both abnormal segments the left ACA is stenotic but not occluded (series 16, image 26). The right ACA branches appear more normal. Left MCA M1 and bifurcation are tortuous, patent. However, there is moderate to severe stenosis of the left MCA anterior M2 branch best seen on series 102, image 200. No right MCA branch occlusion identified. Right MCA M1 segment and bifurcation are tortuous without stenosis. Right MCA branches are within normal limits. Venous sinuses: Early contrast timing, not evaluated. Anatomic variants: Non dominant left vertebral artery arises from the aorta, nearly terminates in PICA. Fetal type bilateral PCA origins. Review of the MIP images confirms the above findings IMPRESSION: 1. Negative for large vessel occlusion. 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 This might be an unusual presentation of Fibromuscular Dysplasia (FMD). Mycotic vessel changes are also a consideration. 2. 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 Bilateral ICA siphon calcified plaque but only mild associated siphon stenosis. 3. No significant extracranial atherosclerosis or stenosis. Generalized arterial tortuosity in the head and neck. 4. Expected subtle CT appearance of the superior left perirolandic small infarct. No associated hemorrhage or mass effect. Electronically Signed   By: Marlise Simpers M.D.   On: 03/23/2024 09:20   MR BRAIN WO CONTRAST Result Date: 03/21/2024 CLINICAL DATA:  Right-sided weakness EXAM: MRI HEAD WITHOUT CONTRAST TECHNIQUE: Multiplanar, multiecho pulse sequences of the brain and surrounding structures were obtained without intravenous contrast. COMPARISON:  None Available. FINDINGS: Brain: Small focus of acute ischemia  within the posterior left frontal lobe, within the ACA territory. Multifocal chronic microhemorrhage in the left temporal lobe. There is confluent hyperintense T2-weighted signal within the white matter. Generalized volume loss. Old left frontal and left cerebellar infarcts. The midline structures are normal. Vascular: Normal flow voids. Skull and upper cervical spine: Normal  calvarium and skull base. Visualized upper cervical spine and soft tissues are normal. Sinuses/Orbits:Right ostiomeatal pattern sinusitis.  Normal orbits. IMPRESSION: 1. Small focus of acute ischemia within the posterior left frontal lobe, within the ACA territory. No acute hemorrhage or mass effect. 2. Old left frontal and left cerebellar infarcts. 3. Right ostiomeatal complex pattern sinusitis. Electronically Signed   By: Juanetta Nordmann M.D.   On: 03/21/2024 21:58   CT HEAD WO CONTRAST Result Date: 03/21/2024 CLINICAL DATA:  Right leg numbness EXAM: CT HEAD WITHOUT CONTRAST TECHNIQUE: Contiguous axial images were obtained from the base of the skull through the vertex without intravenous contrast. RADIATION DOSE REDUCTION: This exam was performed according to the departmental dose-optimization program which includes automated exposure control, adjustment of the mA and/or kV according to patient size and/or use of iterative reconstruction technique. COMPARISON:  CT 02/12/2021 FINDINGS: Brain: No acute territorial infarction, hemorrhage or intracranial mass. Atrophy and moderate severe chronic small vessel ischemic changes of the white matter. Stable ventricle size and morphology. 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  Carotid vascular calcification Skull: No fracture Sinuses/Orbits: Moderate mucosal thickening in the maxillary sinuses, ethmoid sinuses and right frontal sinus. Other: None IMPRESSION: 1. No CT evidence for acute intracranial abnormality. 2. Atrophy and chronic small vessel ischemic  changes of the white matter. Small chronic infarct in the left cerebellum. Electronically Signed   By: Esmeralda Hedge M.D.   On: 03/21/2024 18:27         Linna Thebeau, DO [facility] 03/24/2024, 12:56 PM    Dictation software may have been used to generate the above note. Typos may occur and escape review in typed/dictated notes. Please contact Dr Authur Leghorn directly for clarity if needed.  Staff may message me via secure chat in Epic  but this may not receive an immediate response,  please page me for urgent matters!  If 7PM-7AM, please contact night coverage [email or web address]

## 2024-03-24 NOTE — Plan of Care (Signed)
  Problem: Education: Goal: Knowledge of disease or condition will improve Outcome: Progressing   Problem: Education: Goal: Knowledge of secondary prevention will improve (MUST DOCUMENT ALL) Outcome: Progressing   Problem: Education: Goal: Knowledge of patient specific risk factors will improve (DELETE if not current risk factor) Outcome: Progressing   Problem: Ischemic Stroke/TIA Tissue Perfusion: Goal: Complications of ischemic stroke/TIA will be minimized Outcome: Progressing   Problem: Pain Managment: Goal: General experience of comfort will improve and/or be controlled Outcome: Progressing   Problem: Safety: Goal: Ability to remain free from injury will improve Outcome: Progressing

## 2024-03-24 NOTE — Plan of Care (Signed)
  Problem: Education: Goal: Knowledge of disease or condition will improve Outcome: Progressing Goal: Knowledge of secondary prevention will improve (MUST DOCUMENT ALL) Outcome: Progressing Goal: Knowledge of patient specific risk factors will improve (DELETE if not current risk factor) Outcome: Progressing   Problem: Ischemic Stroke/TIA Tissue Perfusion: Goal: Complications of ischemic stroke/TIA will be minimized Outcome: Progressing   Problem: Coping: Goal: Will verbalize positive feelings about self Outcome: Progressing

## 2024-03-24 NOTE — Plan of Care (Signed)
   Problem: Education: Goal: Knowledge of disease or condition will improve Outcome: Progressing Goal: Knowledge of secondary prevention will improve (MUST DOCUMENT ALL) Outcome: Progressing Goal: Knowledge of patient specific risk factors will improve (DELETE if not current risk factor) Outcome: Progressing

## 2024-03-24 NOTE — Progress Notes (Signed)
 Mobility Specialist - Progress Note     03/24/24 1400  Mobility  Activity Ambulated with assistance in hallway;Stood at bedside;Dangled on edge of bed  Level of Assistance Standby assist, set-up cues, supervision of patient - no hands on  Assistive Device Front wheel walker  Distance Ambulated (ft) 140 ft  Range of Motion/Exercises Active  Activity Response Tolerated well  Mobility Referral Yes  Mobility visit 1 Mobility  Mobility Specialist Start Time (ACUTE ONLY) 1346  Mobility Specialist Stop Time (ACUTE ONLY) 1407  Mobility Specialist Time Calculation (min) (ACUTE ONLY) 21 min   Pt resting in bed on RA upon entry. Pt performs hygiene tasks standing at side of bed holding  onto walker SBA. Pt asked if he has had trouble using the urinal, burning, trouble peeing at all but he says no to all 3. Pt ambulates to hallway around NS SBA (CGA during turns due to instability) with RW. Pt heavily applies weight through his hands onto walker to assist left side limp and right side foot drag. Pt given verbal cuing to remain close to walker and extra time to perform turns without falling over right foot. Pt had some shaking in upper body toward the end of mobility session. Pt returned to recliner and left with needs in reach and chair alarm activated.   [name] Mobility Specialist 03/24/24, 2:24 PM

## 2024-03-25 DIAGNOSIS — I639 Cerebral infarction, unspecified: Secondary | ICD-10-CM | POA: Diagnosis not present

## 2024-03-25 NOTE — Progress Notes (Signed)
 PROGRESS NOTE    Ronnie Bullock   ZOX:[id number] DOB: 31-Jan-1946  DOA: 03/21/2024 Date of Service: 03/25/24 which is hospital day 0  PCP: [name], DO    Hospital course / significant events:   HPI: Ronnie Bullock is a 78 y.o. 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  He had no injury related to the falls. Patient goes to adult daycare who called EMS to take him to the ED after they noticed that he was unable to participate as he usually does as his right leg kept giving away. Patient is unable to contribute to history due to dementia   05/15: CT head nonacute. admitted to hospitalist for CVA workup. MRI showing Small focus of acute ischemia within the posterior left frontal lobe, within the ACA territory.  05/16: PT/OT recs for SNF. Neuro consult. Echo pending. CTA H/N pending.  05/17: abd CTA H/N w/ L ACA abn liekly atherosclerotis but cannot r/o neurosyphilis / fibromuscular dysplasia, also saccular aneurysm - pending RPR 05/18-05/19: RPR and HIV NR. Neuro s/o. 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 However, Ddx for rare causes would include neurosyphilis, FMD, mycotic vessel changes .  PHASES score for the aneurysm is 2, corresponding to a 0.4% 5-year risk of aneurysm rupture.  Permissive hypertension window has  passed but BP has been at goal, continue monitor VS routinely Statins w/ LDL goal less than 70 Continue Eliquis . No ASA/Plavix. If Eliquis  were to be d/c will need ASA  RPR and HIV NR. Fungal infection seems improbable given atherosclerotic disease far more likely and no other clinical suspicion for fungemia, will not get fungal culture at this time  Risk of aneurysm surgery outweighs benefits --> no surgical intervention  In patients with an ischemic stroke or TIA attributed to FMD, secondary prevention measures include antiplatelet therapy, blood pressure control, and lifestyle modification which is all in progress, if other concerns for fibromuscular dysplasia would work this up outpatient    Essential hypertension, benign held home antihypertensives (amlodipine ) for permissive hypertension and BP has been at goal, monitor VS and restart/adjust as needed    Chronic anticoagulation History of DVT LLE 2022 Continue Eliquis , however low risk DVT and high risk falls will d/w daughter re: continuation of this If d/c will need ASA as above for CVA    History of prostate cancer 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  Underweight - under 18  overweight - 25 to 29 obese - 30 or more Class 1 obesity: BMI of 30.0 to 34 Class 2 obesity: BMI of 35.0 to 39 Class 3 obesity: BMI of  40.0 to 49 Super Morbid Obesity: BMI 50-59 Super-super Morbid Obesity: BMI 60+ Healthy nutrition and physical activity advised as adjunct to other disease management and risk reduction treatments    DVT prophylaxis: Eliquis   IV fluids: no continuous IV fluids  Nutrition: regular diet  Central lines / other devices:  none  Code Status: FULL CODE ACP documentation reviewed:  none on file in [facility]  TOC needs: SNF placement  Medical barriers to dispo: none at this time          Subjective / Brief ROS:  Patient reports no concerns orhter than RLE weakness He is pleasantly confused, alert No complaints  Denies CP/SOB.  Pain controlled.  Tolerating diet per SLP and RN.   Family Communication: none at this time      Objective Findings:  Vitals:   03/24/24 2018 03/25/24 0434 03/25/24 0816 03/25/24 1517  BP: 130/79 133/81 120/86 115/78  Pulse: 98 91 85 90  Resp: 16 18 18 18   Temp: 98 F (36.7 C) 98.9 F (37.2 C) 98.4 F (36.9 C) 97.6 F (36.4 C)  TempSrc:  Oral    SpO2: 97% 98% 96% 95%  Weight:      Height:        Intake/Output Summary (Last 24 hours) at 03/25/2024 1614 Last data filed at 03/25/2024 1500 Gross per 24 hour  Intake 1440 ml  Output 1100 ml  Net 340 ml   Filed Weights   03/21/24 1647 03/21/24 1649  Weight: 68 kg 68 kg    Examination:  Physical Exam Constitutional:      General: He is not in acute distress. Cardiovascular:     Rate and Rhythm: Normal rate and regular rhythm.  Pulmonary:     Effort: Pulmonary effort is normal. No respiratory distress.     Breath sounds: Normal breath sounds.  Abdominal:     General: Abdomen is flat.     Palpations: Abdomen is soft.  Musculoskeletal:     Right lower leg: No edema.     Left lower leg: No edema.  Skin:    General: Skin is warm and dry.  Neurological:     Mental Status: He is alert. He is disoriented.     Motor: Weakness (RLE) present.  Psychiatric:        Mood and Affect: Mood normal.        Behavior: Behavior normal.          Scheduled Medications:    stroke: early stages of recovery book   Does not apply Once   apixaban   5 mg Oral BID   donepezil   5 mg Oral QHS   tamsulosin   0.8 mg Oral Daily    Continuous Infusions:   PRN Medications:  acetaminophen  **OR** acetaminophen  (TYLENOL )  oral liquid 160 mg/5 mL **OR** acetaminophen   Antimicrobials from admission:  Anti-infectives (From admission, onward)    None           Data Reviewed:  I have personally reviewed the following...  CBC: Recent Labs  Lab 03/21/24 1651  WBC 5.0  NEUTROABS 2.4  HGB 13.0  HCT 39.6  MCV 91.2  PLT 210   Basic Metabolic Panel: Recent Labs  Lab 03/21/24 1651  NA 137  K 4.0  CL 105  CO2 23  GLUCOSE 141*  BUN 19  CREATININE 1.15  CALCIUM  8.9   GFR: Estimated Creatinine Clearance: 47 mL/min (by C-G formula based on SCr of 1.15 mg/dL). Liver Function Tests: Recent Labs  Lab  03/21/24 1651  AST 20  ALT 10  ALKPHOS 68  BILITOT 0.8  PROT 7.5  ALBUMIN 3.1*   No results for input(s): "LIPASE", "AMYLASE" in the last 168 hours. No results for input(s): "AMMONIA" in the last 168 hours. Coagulation Profile: Recent Labs  Lab 03/21/24 1651  INR 1.1   Cardiac Enzymes: No results for input(s): "CKTOTAL", "CKMB", "CKMBINDEX", "TROPONINI" in the last 168 hours. BNP (last 3 results) No results for input(s): "PROBNP" in the last 8760 hours. HbA1C: No results for input(s): "HGBA1C" in the last 72 hours.  CBG: No results for input(s): "GLUCAP" in the last 168 hours. Lipid Profile: No results for input(s): "CHOL", "HDL", "LDLCALC", "TRIG", "CHOLHDL", "LDLDIRECT" in the last 72 hours.  Thyroid  Function Tests: No results for input(s): "TSH", "T4TOTAL", "FREET4", "T3FREE", "THYROIDAB" in the last 72 hours. Anemia Panel: No results for input(s): "VITAMINB12", "FOLATE", "FERRITIN", "TIBC", "IRON", "RETICCTPCT" in the last 72 hours. Most Recent Urinalysis On File:     Component Value Date/Time   COLORURINE YELLOW (A) 02/12/2021 1658   APPEARANCEUR Clear 11/06/2023 1056   LABSPEC 1.017 02/12/2021 1658   PHURINE 5.0 02/12/2021 1658   GLUCOSEU Negative 11/06/2023 1056   HGBUR NEGATIVE 02/12/2021 1658   BILIRUBINUR Negative 11/06/2023 1056   KETONESUR NEGATIVE 02/12/2021  1658   PROTEINUR 3+ (A) 11/06/2023 1056   PROTEINUR 100 (A) 02/12/2021 1658   UROBILINOGEN 0.2 10/30/2020 1403   NITRITE Negative 11/06/2023 1056   NITRITE NEGATIVE 02/12/2021 1658   LEUKOCYTESUR Negative 11/06/2023 1056   LEUKOCYTESUR NEGATIVE 02/12/2021 1658   Sepsis Labs: @LABRCNTIP (procalcitonin:4,lacticidven:4) Microbiology: No results found for this or any previous visit (from the past 240 hours).    Radiology Studies last 3 days: ECHOCARDIOGRAM COMPLETE Result Date: 03/23/2024    ECHOCARDIOGRAM REPORT   Patient Name:   ALEXIOS KEOWN Date of Exam: 03/22/2024 Medical Rec #:  [id number]        Height:       66.0 in Accession #:    [id number]       Weight:       150.0 lb Date of Birth:  November 06, 1946        BSA:          1.770 m Patient Age:    78 years         BP:           137/105 mmHg Patient Gender: M                HR:           88 bpm. Exam Location:  [facility] Procedure: 2D Echo, Cardiac Doppler and Color Doppler (Both Spectral and Color            Flow Doppler were utilized during procedure). Indications:     Stroke I63.9  History:         Patient has prior history of Echocardiogram examinations, most                  recent 02/14/2021. CAD, Stroke; Risk Factors:Hypertension.  Sonographer:     Terrilee Few RCS Referring Phys:  [id number] Lanetta Pion Diagnosing Phys: Jackquelyn Mass MD IMPRESSIONS  1. Left ventricular ejection fraction, by estimation, is 65 to 70%. The left ventricle has normal function. The left ventricle has no regional wall motion abnormalities. Left ventricular diastolic parameters are consistent with Grade I diastolic dysfunction (impaired relaxation).  2. Right ventricular systolic function is normal. The right ventricular size is normal. There  is normal pulmonary artery systolic pressure. The estimated right ventricular systolic pressure is 23.6 mmHg.  3. The mitral valve is grossly normal. Trivial mitral valve regurgitation. No evidence of mitral stenosis.  4. The aortic  valve is tricuspid. Aortic valve regurgitation is not visualized. No aortic stenosis is present.  5. The inferior vena cava is normal in size with greater than 50% respiratory variability, suggesting right atrial pressure of 3 mmHg. Comparison(s): No significant change from prior study. Conclusion(s)/Recommendation(s): No intracardiac source of embolism detected on this transthoracic study. Consider a transesophageal echocardiogram to exclude cardiac source of embolism if clinically indicated. FINDINGS  Left Ventricle: Left ventricular ejection fraction, by estimation, is 65 to 70%. The left ventricle has normal function. The left ventricle has no regional wall motion abnormalities. The left ventricular internal cavity size was normal in size. There is  no left ventricular hypertrophy. Left ventricular diastolic parameters are consistent with Grade I diastolic dysfunction (impaired relaxation). Right Ventricle: The right ventricular size is normal. No increase in right ventricular wall thickness. Right ventricular systolic function is normal. There is normal pulmonary artery systolic pressure. The tricuspid regurgitant velocity is 2.27 m/s, and  with an assumed right atrial pressure of 3 mmHg, the estimated right ventricular systolic pressure is 23.6 mmHg. Left Atrium: Left atrial size was normal in size. Right Atrium: Right atrial size was normal in size. Pericardium: There is no evidence of pericardial effusion. Mitral Valve: The mitral valve is grossly normal. Trivial mitral valve regurgitation. No evidence of mitral valve stenosis. Tricuspid Valve: The tricuspid valve is grossly normal. Tricuspid valve regurgitation is trivial. No evidence of tricuspid stenosis. Aortic Valve: The aortic valve is tricuspid. Aortic valve regurgitation is not visualized. No aortic stenosis is present. Aortic valve peak gradient measures 5.9 mmHg. Pulmonic Valve: The pulmonic valve was grossly normal. Pulmonic valve regurgitation is  mild. No evidence of pulmonic stenosis. Aorta: The aortic root and ascending aorta are structurally normal, with no evidence of dilitation. Venous: The inferior vena cava is normal in size with greater than 50% respiratory variability, suggesting right atrial pressure of 3 mmHg. IAS/Shunts: The atrial septum is grossly normal.  LEFT VENTRICLE PLAX 2D LVIDd:         3.20 cm   Diastology LVIDs:         1.90 cm   LV e' medial:    7.18 cm/s LV PW:         1.10 cm   LV E/e' medial:  8.5 LV IVS:        0.90 cm   LV e' lateral:   11.90 cm/s LVOT diam:     2.30 cm   LV E/e' lateral: 5.1 LV SV:         56 LV SV Index:   32 LVOT Area:     4.15 cm  RIGHT VENTRICLE             IVC RV S prime:     10.30 cm/s  IVC diam: 1.50 cm TAPSE (M-mode): 2.5 cm LEFT ATRIUM             Index        RIGHT ATRIUM           Index LA diam:        2.40 cm 1.36 cm/m   RA Area:     17.40 cm LA Vol (A2C):   23.3 ml 13.17 ml/m  RA Volume:   53.10 ml  30.01 ml/m LA Vol (  A4C):   28.3 ml 15.99 ml/m LA Biplane Vol: 25.6 ml 14.47 ml/m  AORTIC VALVE                 PULMONIC VALVE AV Area (Vmax): 3.01 cm     PR End Diast Vel: 7.29 msec AV Vmax:        121.00 cm/s AV Peak Grad:   5.9 mmHg LVOT Vmax:      87.53 cm/s LVOT Vmean:     55.033 cm/s LVOT VTI:       0.135 m  AORTA Ao Root diam: 3.20 cm Ao Asc diam:  3.60 cm MITRAL VALVE               TRICUSPID VALVE MV Area (PHT): 4.06 cm    TR Peak grad:   20.6 mmHg MV Decel Time: 187 msec    TR Vmax:        227.00 cm/s MV E velocity: 60.80 cm/s MV A velocity: 94.30 cm/s  SHUNTS MV E/A ratio:  0.64        Systemic VTI:  0.13 m                            Systemic Diam: 2.30 cm Jackquelyn Mass MD Electronically signed by Jackquelyn Mass MD Signature Date/Time: 03/23/2024/2:31:09 PM    Final    CT ANGIO HEAD NECK W WO CM Result Date: 03/23/2024 CLINICAL DATA:  78 year old male with right side weakness. Small left superior motor cortex infarcts, possibly distal left ACA territory. EXAM: CT ANGIOGRAPHY HEAD AND  NECK WITH AND WITHOUT CONTRAST TECHNIQUE: Multidetector CT imaging of the head and neck was performed using the standard protocol during bolus administration of intravenous contrast. Multiplanar CT image reconstructions and MIPs were obtained to evaluate the vascular anatomy. Carotid stenosis measurements (when applicable) are obtained utilizing NASCET criteria, using the distal internal carotid diameter as the denominator. RADIATION DOSE REDUCTION: This exam was performed according to the departmental dose-optimization program which includes automated exposure control, adjustment of the mA and/or kV according to patient size and/or use of iterative reconstruction technique. CONTRAST:  75mL OMNIPAQUE  IOHEXOL  350 MG/ML SOLN COMPARISON:  Brain MRI and Head CT 03/21/2024. FINDINGS: CT HEAD Brain: Left superior motor strip infarct is subtle by CT (series 5, image 24). Other chronically advanced left greater than right cerebral white matter hypodensity, left frontal lobe encephalomalacia with ex vacuo enlargement is stable. Stable chronic left cerebellar linear infarct. No acute intracranial hemorrhage identified. No midline shift, mass effect, or evidence of intracranial mass lesion. No new cortically based infarct identified. Calvarium and skull base: Intact. No acute osseous abnormality identified. Paranasal sinuses: Paranasal sinus disease is stable. Tympanic cavities and mastoids remain well aerated. Orbits: No acute orbit or scalp soft tissue finding. CTA NECK Skeleton: Absent dentition. Cervical spine degeneration. No acute osseous abnormality identified. Upper chest: Dependent atelectasis and/or trace pleural effusions. Negative visible superior mediastinum. Other neck: Retained secretions in the hypopharynx, subglottic larynx and trachea at the thoracic inlet. Other nonvascular neck soft tissue spaces appear negative. Aortic arch: Tortuous aortic arch. Four vessel arch with separate origin of the left vertebral  artery. Right carotid system: Tortuous brachiocephalic artery without plaque or stenosis. Tortuous right CCA. Minimal plaque at the right carotid bifurcation. No stenosis. Left carotid system: Similar tortuosity with no significant plaque or stenosis. A portion of the proximal left CCA is obscured from dense left subclavian venous contrast streak artifact. Vertebral arteries:  Mildly tortuous proximal right subclavian artery and cervical right vertebral artery with no plaque or stenosis. Non dominant left vertebral artery arises directly from the arch and has a late entry into the cervical transverse foramen. It is diminutive but remains patent to the skull base with no plaque or stenosis identified. CTA HEAD Posterior circulation: Dominant right vertebral artery. Patent vertebrobasilar junction. No distal vertebral plaque or stenosis. Non dominant left vertebral nearly terminates in PICA. Normal PICA origins. Patent basilar artery with tortuosity. Tortuous bilateral fetal type PCA origins. Basilar tip and SCA origins are patent. The basilar tip is mildly tortuous but there is no convincing aneurysm. Bilateral PCA branches are within normal limits. Anterior circulation: Both ICA siphons are patent. Left siphon moderate calcified plaque with mild supraclinoid stenosis. Normal left posterior communicating artery origin. Right siphon mild to moderate calcified plaque, no significant siphon stenosis. Normal right posterior communicating artery origin. Patent carotid termini, MCA and ACA origins. Tortuous A1 segments and anterior communicating artery. Tortuous ACA A2 branches and there are 2 different segmental areas of fusiform and beaded enlargement of the left ACA in both the proximal A2 and A2/A3 junctions (series 16, image 26. The more distal of these has a saccular component projecting to the midline measuring 3-4 mm as seen on series 10, image 159 and series 15, image 21. And distal to both abnormal segments the  left ACA is stenotic but not occluded (series 16, image 26). The right ACA branches appear more normal. Left MCA M1 and bifurcation are tortuous, patent. However, there is moderate to severe stenosis of the left MCA anterior M2 branch best seen on series 102, image 200. No right MCA branch occlusion identified. Right MCA M1 segment and bifurcation are tortuous without stenosis. Right MCA branches are within normal limits. Venous sinuses: Early contrast timing, not evaluated. Anatomic variants: Non dominant left vertebral artery arises from the aorta, nearly terminates in PICA. Fetal type bilateral PCA origins. Review of the MIP images confirms the above findings IMPRESSION: 1. Negative for large vessel occlusion. 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 This might be an unusual presentation of Fibromuscular Dysplasia (FMD). Mycotic vessel changes are also a consideration. 2. 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 Bilateral ICA siphon calcified plaque but only mild associated siphon stenosis. 3. No significant extracranial atherosclerosis or stenosis. Generalized arterial tortuosity in the head and neck. 4. Expected subtle CT appearance of the superior left perirolandic small infarct. No associated hemorrhage or mass effect. Electronically Signed   By: Marlise Simpers M.D.   On: 03/23/2024 09:20   MR BRAIN WO CONTRAST Result Date: 03/21/2024 CLINICAL DATA:  Right-sided weakness EXAM: MRI HEAD WITHOUT CONTRAST TECHNIQUE: Multiplanar, multiecho pulse sequences of the brain and surrounding structures were obtained without intravenous contrast. COMPARISON:  None Available. FINDINGS: Brain: Small focus of acute ischemia within the posterior left  frontal lobe, within the ACA territory. Multifocal chronic microhemorrhage in the left temporal lobe. There is confluent hyperintense T2-weighted signal within the white matter. Generalized volume loss. Old left frontal and left cerebellar infarcts. The midline structures are normal. Vascular: Normal flow voids. Skull and upper cervical spine: Normal calvarium and skull base. Visualized upper cervical spine and soft tissues are normal. Sinuses/Orbits:Right  ostiomeatal pattern sinusitis.  Normal orbits. IMPRESSION: 1. Small focus of acute ischemia within the posterior left frontal lobe, within the ACA territory. No acute hemorrhage or mass effect. 2. Old left frontal and left cerebellar infarcts. 3. Right ostiomeatal complex pattern sinusitis. Electronically Signed   By: Juanetta Nordmann M.D.   On: 03/21/2024 21:58   CT HEAD WO CONTRAST Result Date: 03/21/2024 CLINICAL DATA:  Right leg numbness EXAM: CT HEAD WITHOUT CONTRAST TECHNIQUE: Contiguous axial images were obtained from the base of the skull through the vertex without intravenous contrast. RADIATION DOSE REDUCTION: This exam was performed according to the departmental dose-optimization program which includes automated exposure control, adjustment of the mA and/or kV according to patient size and/or use of iterative reconstruction technique. COMPARISON:  CT 02/12/2021 FINDINGS: Brain: No acute territorial infarction, hemorrhage or intracranial mass. Atrophy and moderate severe chronic small vessel ischemic changes of the white matter. Stable ventricle size and morphology. 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  Carotid vascular calcification Skull: No fracture Sinuses/Orbits: Moderate mucosal thickening in the maxillary sinuses, ethmoid sinuses and right frontal sinus. Other: None IMPRESSION: 1. No CT evidence for acute intracranial abnormality. 2. Atrophy and chronic small vessel ischemic changes of the white matter.  Small chronic infarct in the left cerebellum. Electronically Signed   By: Esmeralda Hedge M.D.   On: 03/21/2024 18:27         Deyanira Fesler, DO [facility] 03/25/2024, 4:14 PM    Dictation software may have been used to generate the above note. Typos may occur and escape review in typed/dictated notes. Please contact Dr Authur Leghorn directly for clarity if needed.  Staff may message me via secure chat in Epic  but this may not receive an immediate response,  please page me for urgent matters!  If 7PM-7AM, please contact night coverage [email or web address]

## 2024-03-25 NOTE — NC FL2 (Signed)
 [place]  MEDICAID FL2 LEVEL OF CARE FORM     IDENTIFICATION  Patient Name: Ronnie Bullock Birthdate: [birth year] Sex: male Admission Date (Current Location): 03/21/2024  [facility] and [place] Number:  Chiropodist and Address:  [facility], [address] [place], [place] [address]      Provider Number: [id number]  Attending Physician Name and Address:  Melodi Sprung, DO  Relative Name and Phone Number:  Lacie Piano [phone]    Current Level of Care: SNF Recommended Level of Care: Skilled Nursing Facility Prior Approval Number:    Date Approved/Denied:   PASRR Number:    Discharge Plan: SNF    Current Diagnoses: Patient Active Problem List   Diagnosis Date Noted   Acute CVA (cerebrovascular accident) (HCC) 03/22/2024   Chronic anticoagulation 03/21/2024   History of prostate cancer 03/21/2024   Transportation insecurity 07/12/2022   Seasonal allergic rhinitis due to pollen 10/22/2021   CAD (coronary artery disease) 08/24/2021   Demand ischemia (HCC) 02/12/2021   History of DVT (deep vein thrombosis) 02/09/2021   Dementia without behavioral disturbance (HCC) 01/21/2021   DVT (deep venous thrombosis) (HCC) 01/20/2021   Prostate cancer (HCC) 07/03/2019   Encysted hydrocele 03/28/2019   Benign neoplasm of ascending colon    Benign neoplasm of cecum    Hypercholesteremia 05/02/2018   Advanced care planning/counseling discussion 11/10/2017   Essential hypertension, benign 10/10/2017    Orientation RESPIRATION BLADDER Height & Weight     Self    Incontinent Weight: 68 kg Height:  5\' 6"  (167.6 cm)  BEHAVIORAL SYMPTOMS/MOOD NEUROLOGICAL BOWEL NUTRITION STATUS      Incontinent Diet (Regular)  AMBULATORY STATUS COMMUNICATION OF NEEDS Skin   Limited Assist                           Personal Care Assistance Level of Assistance  Bathing, Feeding, Dressing Bathing Assistance: Limited assistance Feeding assistance:  Limited assistance Dressing Assistance: Limited assistance     Functional Limitations Info             SPECIAL CARE FACTORS FREQUENCY  PT (By licensed PT), OT (By licensed OT)     PT Frequency: 5 x week OT Frequency: 5 x week            Contractures      Additional Factors Info  Code Status, Allergies Code Status Info: FULL Allergies Info: NKDA           Current Medications (03/25/2024):  This is the current hospital active medication list Current Facility-Administered Medications  Medication Dose Route Frequency Provider Last Rate Last Admin    stroke: early stages of recovery book   Does not apply Once Lanetta Pion, MD       acetaminophen  (TYLENOL ) tablet 650 mg  650 mg Oral Q4H PRN Duncan, Hazel V, MD   650 mg at 03/21/24 2145   Or   acetaminophen  (TYLENOL ) 160 MG/5ML solution 650 mg  650 mg Per Tube Q4H PRN Lanetta Pion, MD       Or   acetaminophen  (TYLENOL ) suppository 650 mg  650 mg Rectal Q4H PRN Lanetta Pion, MD       apixaban  (ELIQUIS ) tablet 5 mg  5 mg Oral BID Duncan, Hazel V, MD   5 mg at 03/25/24 0940   donepezil  (ARICEPT ) tablet 5 mg  5 mg Oral QHS Duncan, Hazel V, MD   5 mg at 03/24/24 2105  tamsulosin  (FLOMAX ) capsule 0.8 mg  0.8 mg Oral Daily Duncan, Hazel V, MD   0.8 mg at 03/25/24 0940     Discharge Medications: Please see discharge summary for a list of discharge medications.  Relevant Imaging Results:  Relevant Lab Results:   Additional Information [id number]  Elsie Halo, RN

## 2024-03-25 NOTE — Progress Notes (Signed)
 Physical Therapy Treatment Patient Details Name: Ronnie Bullock MRN: [id number] DOB: [birth year] Today's Date: 03/25/2024   History of Present Illness Pt is a 78 y.o. male being admitted with RLE weakness resulting in 6 falls. MRI brain: Small focus of acute ischemia within the posterior left frontal lobe, within the ACA territory. 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    PT Comments  Pt alert, oriented to self, pleasant, followed all simple commands, denied pain. Supine to sit with supervision and verbal cues for technique (pt attempted to scoot laterally off EOB). Sit <> stand twice, CGA with RW and necessary verbal cues for safe technique. He ambulated ~68ft, twice. Seated rest break in between bouts. Cga-minA, 1 LOB with fatigue, minA to correct. tactile cues for RLE advancement; pt often with drag of RLE, decreased coordination noted. Intermittent TDWB with small hop of LLE due decreased awareness/weight bearing of RLE, improved with constant tactile cues for R step length/placement. Pt up in chair with breakfast in reach. The patient would benefit from further skilled PT intervention to continue to progress towards goals.     If plan is discharge home, recommend the following: A little help with walking and/or transfers;A little help with bathing/dressing/bathroom   Can travel by private vehicle     Yes  Equipment Recommendations  Other (comment) (defer to next level of care)    Recommendations for Other Services       Precautions / Restrictions Precautions Precautions: Fall Recall of Precautions/Restrictions: Intact Restrictions Weight Bearing Restrictions Per Provider Order: No     Mobility  Bed Mobility Overal bed mobility: Needs Assistance Bed Mobility: Supine to Sit     Supine to sit: Supervision     General bed mobility comments: effortful today, verbal cues for tehcnique    Transfers Overall  transfer level: Needs assistance Equipment used: Rolling walker (2 wheels) Transfers: Sit to/from Stand Sit to Stand: Contact guard assist           General transfer comment: verbal cues each time for safe technique, RW placement    Ambulation/Gait Ambulation/Gait assistance: Contact guard assist, Min assist Gait Distance (Feet):  (two bouts of 44ft) Assistive device: Rolling walker (2 wheels)   Gait velocity: decreased     General Gait Details: 1 LOB on second bout of ambulation, minA to correct. tactile cues for RLE advancement; pt often with drag of RLE, decreased coordination. intermittent TDWB with small hop of LLE. able to improve with constant tactile cues for R step   Stairs             Wheelchair Mobility     Tilt Bed    Modified Rankin (Stroke Patients Only)       Balance Overall balance assessment: Needs assistance Sitting-balance support: No upper extremity supported, Feet supported Sitting balance-Leahy Scale: Good     Standing balance support: Bilateral upper extremity supported, During functional activity, Reliant on assistive device for balance Standing balance-Leahy Scale: Poor                              Communication    Cognition Arousal: Alert Behavior During Therapy: WFL for tasks assessed/performed   PT - Cognitive impairments: History of cognitive impairments                       PT - Cognition Comments: Pt is A and O  to self however only truely oriented x 2. Does follow simple verbal commands, pleasant and cooperative Following commands: Intact      Cueing Cueing Techniques: Verbal cues, Tactile cues  Exercises      General Comments        Pertinent Vitals/Pain Pain Assessment Pain Assessment: No/denies pain    Home Living                          Prior Function            PT Goals (current goals can now be found in the care plan section) Progress towards PT goals: Progressing  toward goals    Frequency    Min 3X/week      PT Plan      Co-evaluation              AM-PAC PT "6 Clicks" Mobility   Outcome Measure  Help needed turning from your back to your side while in a flat bed without using bedrails?: A Little Help needed moving from lying on your back to sitting on the side of a flat bed without using bedrails?: A Little Help needed moving to and from a bed to a chair (including a wheelchair)?: A Little Help needed standing up from a chair using your arms (e.g., wheelchair or bedside chair)?: A Little Help needed to walk in hospital room?: A Little Help needed climbing 3-5 steps with a railing? : A Lot 6 Click Score: 17    End of Session Equipment Utilized During Treatment: Gait belt Activity Tolerance: Patient tolerated treatment well Patient left: in chair;with call bell/phone within reach;with chair alarm set Nurse Communication: Mobility status PT Visit Diagnosis: Hemiplegia and hemiparesis Hemiplegia - Right/Left: Right Hemiplegia - dominant/non-dominant: Dominant Hemiplegia - caused by: Cerebral infarction     Time: 1610-9604 PT Time Calculation (min) (ACUTE ONLY): 16 min  Charges:    $Gait Training: 8-22 mins PT General Charges $$ ACUTE PT VISIT: 1 Visit                     Darien Eden PT, DPT 9:27 AM,03/25/24

## 2024-03-25 NOTE — Plan of Care (Signed)

## 2024-03-25 NOTE — Progress Notes (Signed)
 Occupational Therapy Treatment Patient Details Name: Ronnie Bullock MRN: [id number] DOB: 05/23/1946 Today's Date: 03/25/2024   History of present illness Pt is a 78 y.o. male being admitted with RLE weakness resulting in 6 falls. MRI brain: Small focus of acute ischemia within the posterior left frontal lobe, within the ACA territory. 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   OT comments  Pt seen for OT tx. Pt received in the recliner, agreeable to session. Pt required CGA for transfer from recliner with RW and ambulated to the counter with CGA. R foot doesn't fully clear the floor. CGA to complete grooming tasks at the sink with intermittent UE vs BUE forearm support on the counter for stability. VC for sequencing with RW when walking back to the recliner. With turning to the R side, pt demo's difficulty with R foot crossing over L slightly and stepping on the L sock. VC for resetting as he attempted to pull his L foot/sock out from under R foot. High falls risk. Pt continues to benefit from skilled OT services.       If plan is discharge home, recommend the following:  A little help with walking and/or transfers;A lot of help with bathing/dressing/bathroom;Direct supervision/assist for financial management;Supervision due to cognitive status;Help with stairs or ramp for entrance   Equipment Recommendations  Other (comment) (defer)    Recommendations for Other Services      Precautions / Restrictions Precautions Precautions: Fall Recall of Precautions/Restrictions: Intact Restrictions Weight Bearing Restrictions Per Provider Order: No       Mobility Bed Mobility               General bed mobility comments: NT, in recliner    Transfers Overall transfer level: Needs assistance Equipment used: Rolling walker (2 wheels) Transfers: Sit to/from Stand Sit to Stand: Contact guard assist           General  transfer comment: VC for hand placement, safety     Balance Overall balance assessment: Needs assistance Sitting-balance support: No upper extremity supported, Feet supported Sitting balance-Leahy Scale: Good     Standing balance support: Bilateral upper extremity supported, During functional activity, Reliant on assistive device for balance Standing balance-Leahy Scale: Poor                             ADL either performed or assessed with clinical judgement   ADL Overall ADL's : Needs assistance/impaired     Grooming: Wash/dry hands;Wash/dry face;Standing;Contact guard assist Grooming Details (indicate cue type and reason): intermittent forearm support on counter for stability                                    Extremity/Trunk Assessment              Vision       Perception     Praxis     Communication Communication Communication: Impaired Factors Affecting Communication: Difficulty expressing self;Reduced clarity of speech   Cognition Arousal: Alert Behavior During Therapy: WFL for tasks assessed/performed Cognition: History of cognitive impairments                               Following commands: Intact        Cueing   Cueing Techniques: Verbal cues,  Tactile cues  Exercises      Shoulder Instructions       General Comments      Pertinent Vitals/ Pain       Pain Assessment Pain Assessment: No/denies pain  Home Living                                          Prior Functioning/Environment              Frequency  Min 3X/week        Progress Toward Goals  OT Goals(current goals can now be found in the care plan section)  Progress towards OT goals: Progressing toward goals  Acute Rehab OT Goals Patient Stated Goal: improve function OT Goal Formulation: With patient Time For Goal Achievement: 04/05/24 Potential to Achieve Goals: Good  Plan      Co-evaluation                  AM-PAC OT "6 Clicks" Daily Activity     Outcome Measure   Help from another person eating meals?: None Help from another person taking care of personal grooming?: A Little Help from another person toileting, which includes using toliet, bedpan, or urinal?: A Little Help from another person bathing (including washing, rinsing, drying)?: A Lot Help from another person to put on and taking off regular upper body clothing?: None Help from another person to put on and taking off regular lower body clothing?: A Lot 6 Click Score: 18    End of Session Equipment Utilized During Treatment: Gait belt;Rolling walker (2 wheels)  OT Visit Diagnosis: Other abnormalities of gait and mobility (R26.89);Muscle weakness (generalized) (M62.81);Unsteadiness on feet (R26.81)   Activity Tolerance Patient tolerated treatment well   Patient Left in chair;with call bell/phone within reach;with chair alarm set   Nurse Communication          Time: 1610-9604 OT Time Calculation (min): 14 min  Charges: OT General Charges $OT Visit: 1 Visit OT Treatments $Self Care/Home Management : 8-22 mins  Berenda Breaker., MPH, MS, OTR/L ascom [phone] 03/25/24, 1:21 PM

## 2024-03-25 NOTE — TOC Initial Note (Addendum)
 Transition of Care [facility]) - Initial/Assessment Note    Patient Details  Name: Ronnie Bullock MRN: [id number] Date of Birth: [birth year]  Transition of Care [facility]) CM/SW Contact:    Ronnie Halo, RN Phone Number: 03/25/2024, 11:50 AM  Clinical Narrative:                  TOC spoke with the patient in the room. He lives at home with his brother. Per the notes he attends adult daycare. His PCP is Dr. Terre Bullock. He doesn't drive, but his siblings drive home to and from appointments  SNF is being recommended. TOC completed FL2 and sent out in [place]  TOC outreached to the patient's sister, Ronnie Bullock [phone], but didn't receive an answer.  TOC will continue to follow for DC planning  Expected Discharge Plan: Skilled Nursing Facility Barriers to Discharge: Continued Medical Work up   Patient Goals and CMS Choice            Expected Discharge Plan and Services       Living arrangements for the past 2 months: Single Family Home                                      Prior Living Arrangements/Services Living arrangements for the past 2 months: Single Family Home Lives with:: Siblings (Lives with his brother)                   Activities of Daily Living   ADL Screening (condition at time of admission) Independently performs ADLs?: No Does the patient have a NEW difficulty with bathing/dressing/toileting/self-feeding that is expected to last >3 days?: No Does the patient have a NEW difficulty with getting in/out of bed, walking, or climbing stairs that is expected to last >3 days?: Yes (Initiates electronic notice to provider for possible PT consult) Does the patient have a NEW difficulty with communication that is expected to last >3 days?: No Is the patient deaf or have difficulty hearing?: No Does the patient have difficulty seeing, even when wearing glasses/contacts?: No Does the patient have difficulty concentrating, remembering, or making  decisions?: Yes  Permission Sought/Granted                  Emotional Assessment       Orientation: : Oriented to Self   Psych Involvement: No (comment)  Admission diagnosis:  Focal neurological deficit [R29.818] Right sided weakness [R53.1] Right sided numbness [R20.0] Patient Active Problem List   Diagnosis Date Noted   Acute CVA (cerebrovascular accident) (HCC) 03/22/2024   Chronic anticoagulation 03/21/2024   History of prostate cancer 03/21/2024   Transportation insecurity 07/12/2022   Seasonal allergic rhinitis due to pollen 10/22/2021   CAD (coronary artery disease) 08/24/2021   Demand ischemia (HCC) 02/12/2021   History of DVT (deep vein thrombosis) 02/09/2021   Dementia without behavioral disturbance (HCC) 01/21/2021   DVT (deep venous thrombosis) (HCC) 01/20/2021   Prostate cancer (HCC) 07/03/2019   Encysted hydrocele 03/28/2019   Benign neoplasm of ascending colon    Benign neoplasm of cecum    Hypercholesteremia 05/02/2018   Advanced care planning/counseling discussion 11/10/2017   Essential hypertension, benign 10/10/2017   PCP:  Ronnie Dupre, DO Pharmacy:   [facility] [phone] [place], [place] - [address] [place] [address] Phone: [phone] Fax: [phone]  [facility] - [place], [place] -  [address] [place] [address] Phone: [phone] Fax: [phone]     Social Drivers of Health (SDOH) Social History: SDOH Screenings   Food Insecurity: No Food Insecurity (03/22/2024)  Housing: Unknown (03/22/2024)  Transportation Needs: No Transportation Needs (03/22/2024)  Utilities: Not At Risk (03/22/2024)  Alcohol Screen: Low Risk  (08/02/2022)  Depression (PHQ2-9): Low Risk  (12/13/2023)  Financial Resource Strain: Low Risk  (09/05/2023)  Physical Activity: Inactive (08/02/2022)  Social Connections: Moderately Integrated (03/22/2024)  Stress: No Stress Concern Present (08/02/2022)  Tobacco Use: Low  Risk  (03/21/2024)  Health Literacy: Adequate Health Literacy (06/28/2023)   SDOH Interventions:     Readmission Risk Interventions     No data to display

## 2024-03-25 NOTE — [place] FL2 (Signed)
 [place]  MEDICAID FL2 LEVEL OF CARE FORM     IDENTIFICATION  Patient Name: Ronnie Bullock Birthdate: 1945-11-29 Sex: male Admission Date (Current Location): 03/21/2024  [facility] and [place] Number:  Chiropodist and Address:  [facility], [address] [place], [place] [address]      Provider Number: [id number]  Attending Physician Name and Address:  Melodi Sprung, DO  Relative Name and Phone Number:  Lacie Piano [phone]    Current Level of Care: SNF Recommended Level of Care: Skilled Nursing Facility Prior Approval Number:    Date Approved/Denied:   PASRR Number:  [id number]    Discharge Plan: SNF    Current Diagnoses: Patient Active Problem List   Diagnosis Date Noted   Acute CVA (cerebrovascular accident) (HCC) 03/22/2024   Chronic anticoagulation 03/21/2024   History of prostate cancer 03/21/2024   Transportation insecurity 07/12/2022   Seasonal allergic rhinitis due to pollen 10/22/2021   CAD (coronary artery disease) 08/24/2021   Demand ischemia (HCC) 02/12/2021   History of DVT (deep vein thrombosis) 02/09/2021   Dementia without behavioral disturbance (HCC) 01/21/2021   DVT (deep venous thrombosis) (HCC) 01/20/2021   Prostate cancer (HCC) 07/03/2019   Encysted hydrocele 03/28/2019   Benign neoplasm of ascending colon    Benign neoplasm of cecum    Hypercholesteremia 05/02/2018   Advanced care planning/counseling discussion 11/10/2017   Essential hypertension, benign 10/10/2017    Orientation RESPIRATION BLADDER Height & Weight     Self    Incontinent Weight: 68 kg Height:  5\' 6"  (167.6 cm)  BEHAVIORAL SYMPTOMS/MOOD NEUROLOGICAL BOWEL NUTRITION STATUS      Incontinent Diet (Regular)  AMBULATORY STATUS COMMUNICATION OF NEEDS Skin   Limited Assist                           Personal Care Assistance Level of Assistance  Bathing, Feeding, Dressing Bathing Assistance: Limited assistance Feeding  assistance: Limited assistance Dressing Assistance: Limited assistance     Functional Limitations Info             SPECIAL CARE FACTORS FREQUENCY  PT (By licensed PT), OT (By licensed OT)     PT Frequency: 5 x week OT Frequency: 5 x week            Contractures      Additional Factors Info  Code Status, Allergies Code Status Info: FULL Allergies Info: NKDA           Current Medications (03/25/2024):  This is the current hospital active medication list Current Facility-Administered Medications  Medication Dose Route Frequency Provider Last Rate Last Admin    stroke: early stages of recovery book   Does not apply Once Lanetta Pion, MD       acetaminophen  (TYLENOL ) tablet 650 mg  650 mg Oral Q4H PRN Duncan, Hazel V, MD   650 mg at 03/21/24 2145   Or   acetaminophen  (TYLENOL ) 160 MG/5ML solution 650 mg  650 mg Per Tube Q4H PRN Duncan, Hazel V, MD       Or   acetaminophen  (TYLENOL ) suppository 650 mg  650 mg Rectal Q4H PRN Lanetta Pion, MD       apixaban  (ELIQUIS ) tablet 5 mg  5 mg Oral BID Duncan, Hazel V, MD   5 mg at 03/25/24 0940   donepezil  (ARICEPT ) tablet 5 mg  5 mg Oral QHS Duncan, Hazel V, MD   5 mg at  03/24/24 2105   tamsulosin  (FLOMAX ) capsule 0.8 mg  0.8 mg Oral Daily Duncan, Hazel V, MD   0.8 mg at 03/25/24 0940     Discharge Medications: Please see discharge summary for a list of discharge medications.  Relevant Imaging Results:  Relevant Lab Results:   Additional Information [id number]  Elsie Halo, RN

## 2024-03-26 DIAGNOSIS — I639 Cerebral infarction, unspecified: Secondary | ICD-10-CM | POA: Diagnosis not present

## 2024-03-26 NOTE — Progress Notes (Signed)
 PROGRESS NOTE    Ronnie Bullock   ZOX:[id number] DOB: [birth year]  DOA: 03/21/2024 Date of Service: 03/26/24 which is hospital day 0  PCP: [name], DO    Hospital course / significant events:   HPI: Ronnie Bullock is a 78 y.o. 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  He had no injury related to the falls. Patient goes to adult daycare who called EMS to take him to the ED after they noticed that he was unable to participate as he usually does as his right leg kept giving away. Patient is unable to contribute to history due to dementia   05/15: CT head nonacute. admitted to hospitalist for CVA workup. MRI showing Small focus of acute ischemia within the posterior left frontal lobe, within the ACA territory.  05/16: PT/OT recs for SNF. Neuro consult. Echo pending. CTA H/N pending.  05/17: abd CTA H/N w/ L ACA abn liekly atherosclerotis but cannot r/o neurosyphilis / fibromuscular dysplasia, also saccular aneurysm - pending RPR -->  RPR and HIV NR. 05/18-05/20: Neuro s/o. 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 However, Ddx for rare causes would include neurosyphilis, FMD, mycotic vessel changes .  PHASES score for the aneurysm is 2, corresponding to a 0.4% 5-year risk of aneurysm rupture.  Permissive hypertension  window has passed and BP has been at goal, continue monitor VS routinely Statins w/ LDL goal less than 70 Continue Eliquis . No ASA/Plavix. If Eliquis  were to be d/c will need ASA  RPR and HIV NR. Fungal infection seems improbable given atherosclerotic disease far more likely and no other clinical suspicion for fungemia, will not get fungal culture at this time  Risk of aneurysm surgery outweighs benefits --> no surgical intervention  In patients with an ischemic stroke or TIA attributed to FMD, secondary prevention measures include antiplatelet therapy, blood pressure control, and lifestyle modification which is all in progress, if other concerns for fibromuscular dysplasia would work this up outpatient    Essential hypertension, benign held home antihypertensives (amlodipine ) for permissive hypertension and BP has been at goal, monitor VS and restart/adjust as needed    Chronic anticoagulation History of DVT LLE 2022 Continue Eliquis , however low risk DVT and high risk falls will d/w daughter re: continuation of this If d/c will need ASA as above for CVA    History of prostate cancer 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  Underweight - under 18  overweight - 25 to 29 obese - 30 or more Class 1 obesity: BMI of 30.0 to 34 Class 2 obesity: BMI of 35.0 to 39 Class 3 obesity:  BMI of 40.0 to 49 Super Morbid Obesity: BMI 50-59 Super-super Morbid Obesity: BMI 60+ Healthy nutrition and physical activity advised as adjunct to other disease management and risk reduction treatments    DVT prophylaxis: Eliquis   IV fluids: no continuous IV fluids  Nutrition: regular diet  Central lines / other  devices: none  Code Status: FULL CODE ACP documentation reviewed:  none on file in [facility]  TOC needs: SNF placement  Medical barriers to dispo: none at this time          Subjective / Brief ROS:  Patient reports no concerns orhter than RLE weakness He is pleasantly confused, alert No complaints  Denies CP/SOB.  Pain controlled.  Tolerating diet per SLP and RN.   Family Communication: none at this time      Objective Findings:  Vitals:   03/25/24 1948 03/26/24 0418 03/26/24 0743 03/26/24 1533  BP: 129/83 130/88 123/80 106/68  Pulse: 92 85 82 90  Resp: 16 16 16 15   Temp: (!) 97.1 F (36.2 C) (!) 97.3 F (36.3 C) 98.6 F (37 C) 98 F (36.7 C)  TempSrc:      SpO2: 99% 97% 94% 98%  Weight:      Height:        Intake/Output Summary (Last 24 hours) at 03/26/2024 1727 Last data filed at 03/26/2024 1354 Gross per 24 hour  Intake 480 ml  Output 1000 ml  Net -520 ml   Filed Weights   03/21/24 1647 03/21/24 1649  Weight: 68 kg 68 kg    Examination:  Physical Exam Constitutional:      General: He is not in acute distress. Cardiovascular:     Rate and Rhythm: Normal rate and regular rhythm.  Pulmonary:     Effort: Pulmonary effort is normal. No respiratory distress.     Breath sounds: Normal breath sounds.  Abdominal:     General: Abdomen is flat.     Palpations: Abdomen is soft.  Musculoskeletal:     Right lower leg: No edema.     Left lower leg: No edema.  Skin:    General: Skin is warm and dry.  Neurological:     Mental Status: He is alert. He is disoriented.     Motor: Weakness (RLE) present.  Psychiatric:        Mood and Affect: Mood normal.        Behavior: Behavior normal.          Scheduled Medications:    stroke: early stages of recovery book   Does not apply Once   apixaban   5 mg Oral BID   donepezil   5 mg Oral QHS   tamsulosin   0.8 mg Oral Daily    Continuous Infusions:   PRN Medications:  acetaminophen  **OR** acetaminophen   (TYLENOL ) oral liquid 160 mg/5 mL **OR** acetaminophen   Antimicrobials from admission:  Anti-infectives (From admission, onward)    None           Data Reviewed:  I have personally reviewed the following...  CBC: Recent Labs  Lab 03/21/24 1651  WBC 5.0  NEUTROABS 2.4  HGB 13.0  HCT 39.6  MCV 91.2  PLT 210   Basic Metabolic Panel: Recent Labs  Lab 03/21/24 1651  NA 137  K 4.0  CL 105  CO2 23  GLUCOSE 141*  BUN 19  CREATININE 1.15  CALCIUM  8.9   GFR: Estimated Creatinine Clearance: 47 mL/min (by C-G formula based on SCr of 1.15 mg/dL). Liver Function Tests:  Recent Labs  Lab 03/21/24 1651  AST 20  ALT 10  ALKPHOS 68  BILITOT 0.8  PROT 7.5  ALBUMIN 3.1*   No results for input(s): "LIPASE", "AMYLASE" in the last 168 hours. No results for input(s): "AMMONIA" in the last 168 hours. Coagulation Profile: Recent Labs  Lab 03/21/24 1651  INR 1.1   Cardiac Enzymes: No results for input(s): "CKTOTAL", "CKMB", "CKMBINDEX", "TROPONINI" in the last 168 hours. BNP (last 3 results) No results for input(s): "PROBNP" in the last 8760 hours. HbA1C: No results for input(s): "HGBA1C" in the last 72 hours.  CBG: No results for input(s): "GLUCAP" in the last 168 hours. Lipid Profile: No results for input(s): "CHOL", "HDL", "LDLCALC", "TRIG", "CHOLHDL", "LDLDIRECT" in the last 72 hours.  Thyroid  Function Tests: No results for input(s): "TSH", "T4TOTAL", "FREET4", "T3FREE", "THYROIDAB" in the last 72 hours. Anemia Panel: No results for input(s): "VITAMINB12", "FOLATE", "FERRITIN", "TIBC", "IRON", "RETICCTPCT" in the last 72 hours. Most Recent Urinalysis On File:     Component Value Date/Time   COLORURINE YELLOW (A) 02/12/2021 1658   APPEARANCEUR Clear 11/06/2023 1056   LABSPEC 1.017 02/12/2021 1658   PHURINE 5.0 02/12/2021 1658   GLUCOSEU Negative 11/06/2023 1056   HGBUR NEGATIVE 02/12/2021 1658   BILIRUBINUR Negative 11/06/2023 1056   KETONESUR NEGATIVE  02/12/2021 1658   PROTEINUR 3+ (A) 11/06/2023 1056   PROTEINUR 100 (A) 02/12/2021 1658   UROBILINOGEN 0.2 10/30/2020 1403   NITRITE Negative 11/06/2023 1056   NITRITE NEGATIVE 02/12/2021 1658   LEUKOCYTESUR Negative 11/06/2023 1056   LEUKOCYTESUR NEGATIVE 02/12/2021 1658   Sepsis Labs: @LABRCNTIP (procalcitonin:4,lacticidven:4) Microbiology: No results found for this or any previous visit (from the past 240 hours).    Radiology Studies last 3 days: ECHOCARDIOGRAM COMPLETE Result Date: 03/23/2024    ECHOCARDIOGRAM REPORT   Patient Name:   Ronnie Bullock Date of Exam: 03/22/2024 Medical Rec #:  [id number]        Height:       66.0 in Accession #:    [id number]       Weight:       150.0 lb Date of Birth:  [birth year]        BSA:          1.770 m Patient Age:    78 years         BP:           137/105 mmHg Patient Gender: M                HR:           88 bpm. Exam Location:  [facility] Procedure: 2D Echo, Cardiac Doppler and Color Doppler (Both Spectral and Color            Flow Doppler were utilized during procedure). Indications:     Stroke I63.9  History:         Patient has prior history of Echocardiogram examinations, most                  recent 02/14/2021. CAD, Stroke; Risk Factors:Hypertension.  Sonographer:     Terrilee Few RCS Referring Phys:  [id number] Lanetta Pion Diagnosing Phys: Jackquelyn Mass MD IMPRESSIONS  1. Left ventricular ejection fraction, by estimation, is 65 to 70%. The left ventricle has normal function. The left ventricle has no regional wall motion abnormalities. Left ventricular diastolic parameters are consistent with Grade I diastolic dysfunction (impaired relaxation).  2. Right ventricular systolic function is normal. The right ventricular  size is normal. There is normal pulmonary artery systolic pressure. The estimated right ventricular systolic pressure is 23.6 mmHg.  3. The mitral valve is grossly normal. Trivial mitral valve regurgitation. No evidence of mitral stenosis.  4.  The aortic valve is tricuspid. Aortic valve regurgitation is not visualized. No aortic stenosis is present.  5. The inferior vena cava is normal in size with greater than 50% respiratory variability, suggesting right atrial pressure of 3 mmHg. Comparison(s): No significant change from prior study. Conclusion(s)/Recommendation(s): No intracardiac source of embolism detected on this transthoracic study. Consider a transesophageal echocardiogram to exclude cardiac source of embolism if clinically indicated. FINDINGS  Left Ventricle: Left ventricular ejection fraction, by estimation, is 65 to 70%. The left ventricle has normal function. The left ventricle has no regional wall motion abnormalities. The left ventricular internal cavity size was normal in size. There is  no left ventricular hypertrophy. Left ventricular diastolic parameters are consistent with Grade I diastolic dysfunction (impaired relaxation). Right Ventricle: The right ventricular size is normal. No increase in right ventricular wall thickness. Right ventricular systolic function is normal. There is normal pulmonary artery systolic pressure. The tricuspid regurgitant velocity is 2.27 m/s, and  with an assumed right atrial pressure of 3 mmHg, the estimated right ventricular systolic pressure is 23.6 mmHg. Left Atrium: Left atrial size was normal in size. Right Atrium: Right atrial size was normal in size. Pericardium: There is no evidence of pericardial effusion. Mitral Valve: The mitral valve is grossly normal. Trivial mitral valve regurgitation. No evidence of mitral valve stenosis. Tricuspid Valve: The tricuspid valve is grossly normal. Tricuspid valve regurgitation is trivial. No evidence of tricuspid stenosis. Aortic Valve: The aortic valve is tricuspid. Aortic valve regurgitation is not visualized. No aortic stenosis is present. Aortic valve peak gradient measures 5.9 mmHg. Pulmonic Valve: The pulmonic valve was grossly normal. Pulmonic valve  regurgitation is mild. No evidence of pulmonic stenosis. Aorta: The aortic root and ascending aorta are structurally normal, with no evidence of dilitation. Venous: The inferior vena cava is normal in size with greater than 50% respiratory variability, suggesting right atrial pressure of 3 mmHg. IAS/Shunts: The atrial septum is grossly normal.  LEFT VENTRICLE PLAX 2D LVIDd:         3.20 cm   Diastology LVIDs:         1.90 cm   LV e' medial:    7.18 cm/s LV PW:         1.10 cm   LV E/e' medial:  8.5 LV IVS:        0.90 cm   LV e' lateral:   11.90 cm/s LVOT diam:     2.30 cm   LV E/e' lateral: 5.1 LV SV:         56 LV SV Index:   32 LVOT Area:     4.15 cm  RIGHT VENTRICLE             IVC RV S prime:     10.30 cm/s  IVC diam: 1.50 cm TAPSE (M-mode): 2.5 cm LEFT ATRIUM             Index        RIGHT ATRIUM           Index LA diam:        2.40 cm 1.36 cm/m   RA Area:     17.40 cm LA Vol (A2C):   23.3 ml 13.17 ml/m  RA Volume:   53.10 ml  30.01 ml/m LA Vol (A4C):   28.3 ml 15.99 ml/m LA Biplane Vol: 25.6 ml 14.47 ml/m  AORTIC VALVE                 PULMONIC VALVE AV Area (Vmax): 3.01 cm     PR End Diast Vel: 7.29 msec AV Vmax:        121.00 cm/s AV Peak Grad:   5.9 mmHg LVOT Vmax:      87.53 cm/s LVOT Vmean:     55.033 cm/s LVOT VTI:       0.135 m  AORTA Ao Root diam: 3.20 cm Ao Asc diam:  3.60 cm MITRAL VALVE               TRICUSPID VALVE MV Area (PHT): 4.06 cm    TR Peak grad:   20.6 mmHg MV Decel Time: 187 msec    TR Vmax:        227.00 cm/s MV E velocity: 60.80 cm/s MV A velocity: 94.30 cm/s  SHUNTS MV E/A ratio:  0.64        Systemic VTI:  0.13 m                            Systemic Diam: 2.30 cm Jackquelyn Mass MD Electronically signed by Jackquelyn Mass MD Signature Date/Time: 03/23/2024/2:31:09 PM    Final    CT ANGIO HEAD NECK W WO CM Result Date: 03/23/2024 CLINICAL DATA:  78 year old male with right side weakness. Small left superior motor cortex infarcts, possibly distal left ACA territory. EXAM: CT  ANGIOGRAPHY HEAD AND NECK WITH AND WITHOUT CONTRAST TECHNIQUE: Multidetector CT imaging of the head and neck was performed using the standard protocol during bolus administration of intravenous contrast. Multiplanar CT image reconstructions and MIPs were obtained to evaluate the vascular anatomy. Carotid stenosis measurements (when applicable) are obtained utilizing NASCET criteria, using the distal internal carotid diameter as the denominator. RADIATION DOSE REDUCTION: This exam was performed according to the departmental dose-optimization program which includes automated exposure control, adjustment of the mA and/or kV according to patient size and/or use of iterative reconstruction technique. CONTRAST:  75mL OMNIPAQUE  IOHEXOL  350 MG/ML SOLN COMPARISON:  Brain MRI and Head CT 03/21/2024. FINDINGS: CT HEAD Brain: Left superior motor strip infarct is subtle by CT (series 5, image 24). Other chronically advanced left greater than right cerebral white matter hypodensity, left frontal lobe encephalomalacia with ex vacuo enlargement is stable. Stable chronic left cerebellar linear infarct. No acute intracranial hemorrhage identified. No midline shift, mass effect, or evidence of intracranial mass lesion. No new cortically based infarct identified. Calvarium and skull base: Intact. No acute osseous abnormality identified. Paranasal sinuses: Paranasal sinus disease is stable. Tympanic cavities and mastoids remain well aerated. Orbits: No acute orbit or scalp soft tissue finding. CTA NECK Skeleton: Absent dentition. Cervical spine degeneration. No acute osseous abnormality identified. Upper chest: Dependent atelectasis and/or trace pleural effusions. Negative visible superior mediastinum. Other neck: Retained secretions in the hypopharynx, subglottic larynx and trachea at the thoracic inlet. Other nonvascular neck soft tissue spaces appear negative. Aortic arch: Tortuous aortic arch. Four vessel arch with separate origin  of the left vertebral artery. Right carotid system: Tortuous brachiocephalic artery without plaque or stenosis. Tortuous right CCA. Minimal plaque at the right carotid bifurcation. No stenosis. Left carotid system: Similar tortuosity with no significant plaque or stenosis. A portion of the proximal left CCA is obscured from dense left subclavian venous contrast  streak artifact. Vertebral arteries: Mildly tortuous proximal right subclavian artery and cervical right vertebral artery with no plaque or stenosis. Non dominant left vertebral artery arises directly from the arch and has a late entry into the cervical transverse foramen. It is diminutive but remains patent to the skull base with no plaque or stenosis identified. CTA HEAD Posterior circulation: Dominant right vertebral artery. Patent vertebrobasilar junction. No distal vertebral plaque or stenosis. Non dominant left vertebral nearly terminates in PICA. Normal PICA origins. Patent basilar artery with tortuosity. Tortuous bilateral fetal type PCA origins. Basilar tip and SCA origins are patent. The basilar tip is mildly tortuous but there is no convincing aneurysm. Bilateral PCA branches are within normal limits. Anterior circulation: Both ICA siphons are patent. Left siphon moderate calcified plaque with mild supraclinoid stenosis. Normal left posterior communicating artery origin. Right siphon mild to moderate calcified plaque, no significant siphon stenosis. Normal right posterior communicating artery origin. Patent carotid termini, MCA and ACA origins. Tortuous A1 segments and anterior communicating artery. Tortuous ACA A2 branches and there are 2 different segmental areas of fusiform and beaded enlargement of the left ACA in both the proximal A2 and A2/A3 junctions (series 16, image 26. The more distal of these has a saccular component projecting to the midline measuring 3-4 mm as seen on series 10, image 159 and series 15, image 21. And distal to both  abnormal segments the left ACA is stenotic but not occluded (series 16, image 26). The right ACA branches appear more normal. Left MCA M1 and bifurcation are tortuous, patent. However, there is moderate to severe stenosis of the left MCA anterior M2 branch best seen on series 102, image 200. No right MCA branch occlusion identified. Right MCA M1 segment and bifurcation are tortuous without stenosis. Right MCA branches are within normal limits. Venous sinuses: Early contrast timing, not evaluated. Anatomic variants: Non dominant left vertebral artery arises from the aorta, nearly terminates in PICA. Fetal type bilateral PCA origins. Review of the MIP images confirms the above findings IMPRESSION: 1. Negative for large vessel occlusion. 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 This might be an unusual presentation of Fibromuscular Dysplasia (FMD). Mycotic vessel changes are also a consideration. 2. 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 Bilateral ICA siphon calcified plaque but only mild associated siphon stenosis. 3. No significant extracranial atherosclerosis or stenosis. Generalized arterial tortuosity in the head and neck. 4. Expected subtle CT appearance of the superior left perirolandic small infarct. No associated hemorrhage or mass effect. Electronically Signed   By: Marlise Simpers M.D.   On: 03/23/2024 09:20         Avish Torry, DO [facility] 03/26/2024, 5:27 PM    Dictation software may have been used to generate the above note. Typos may occur and escape review in typed/dictated notes. Please contact Dr Authur Leghorn directly for clarity if needed.  Staff may message me via secure chat in Epic  but this may  not receive an immediate response,  please page me for urgent matters!  If 7PM-7AM, please contact night coverage [email or web address]

## 2024-03-26 NOTE — Plan of Care (Signed)
 Ronnie Bullock

## 2024-03-26 NOTE — TOC Transition Note (Addendum)
 Transition of Care [facility]) - Progress Note   Patient Details  Name: Ronnie Bullock MRN: [id number] Date of Birth: October 13, 1946  Transition of Care [facility] CM/SW Contact:  Elsie Halo, RN Phone Number: 03/26/2024, 2:46 PM   Clinical Narrative:    TOC spoke with the patient's sister, [name] [phone] and informed patient has bed offers at [facility] and [facility]. Dawn will discuss bed offers with the family and the patient's pastor and will f/u with TOC by the end of day today.  TOC will continue to follow.     Barriers to Discharge: Continued Medical Work up   Patient Goals and CMS Choice            Discharge Placement                       Discharge Plan and Services Additional resources added to the After Visit Summary for                                       Social Drivers of Health (SDOH) Interventions SDOH Screenings   Food Insecurity: No Food Insecurity (03/22/2024)  Housing: Unknown (03/22/2024)  Transportation Needs: No Transportation Needs (03/22/2024)  Utilities: Not At Risk (03/22/2024)  Alcohol Screen: Low Risk  (08/02/2022)  Depression (PHQ2-9): Low Risk  (12/13/2023)  Financial Resource Strain: Low Risk  (09/05/2023)  Physical Activity: Inactive (08/02/2022)  Social Connections: Moderately Integrated (03/22/2024)  Stress: No Stress Concern Present (08/02/2022)  Tobacco Use: Low Risk  (03/21/2024)  Health Literacy: Adequate Health Literacy (06/28/2023)     Readmission Risk Interventions     No data to display

## 2024-03-26 NOTE — Progress Notes (Signed)
 Physical Therapy Treatment Patient Details Name: Ronnie Bullock MRN: [id number] DOB: Feb 14, 1946 Today's Date: 03/26/2024   History of Present Illness Pt is a 78 y.o. male being admitted with RLE weakness resulting in 6 falls. MRI brain: Small focus of acute ischemia within the posterior left frontal lobe, within the ACA territory. 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    PT Comments  Pt A&O, pleasant and motivated to mobilize. Supervision for bed mobility with reliance on bed rail, extra time. Several transfers performed CGA-minA, occasional steadying of pt or of RW needed. 5 time sit to stand with BUE support 36.16 seconds indicating a decrease for age normatives. He was able to ambulate two bouts today with chair follow, near constant tactile cues for RLE step length and advancement. With fatigue the pt still experiences increased foot drag, increased instability, and difficulty navigating obstacles/turns. Pt up in recliner at end of session, needs in reach. The patient would benefit from further skilled PT intervention to continue to progress towards goals.    If plan is discharge home, recommend the following: A little help with walking and/or transfers;A little help with bathing/dressing/bathroom   Can travel by private vehicle     Yes  Equipment Recommendations  Other (comment) (TBD)    Recommendations for Other Services       Precautions / Restrictions Precautions Precautions: Fall Recall of Precautions/Restrictions: Impaired Restrictions Weight Bearing Restrictions Per Provider Order: No     Mobility  Bed Mobility Overal bed mobility: Needs Assistance Bed Mobility: Supine to Sit     Supine to sit: Supervision          Transfers Overall transfer level: Needs assistance Equipment used: Rolling walker (2 wheels) Transfers: Sit to/from Stand Sit to Stand: Contact guard assist, Min assist            General transfer comment: several transfers performed, CGA, occasional minA for steadying/steadying RW needed    Ambulation/Gait Ambulation/Gait assistance: Contact guard assist, Min assist Gait Distance (Feet):  (2 bouts of ambulation, ~132ft, ~144ft) Assistive device: Rolling walker (2 wheels) Gait Pattern/deviations: Step-through pattern, Trunk flexed, Staggering left, Staggering right, Decreased stride length Gait velocity: decreased     General Gait Details: pt did experience increased R foot drag with fatigue, more difficulty with turning, instability with fatigue   Stairs             Wheelchair Mobility     Tilt Bed    Modified Rankin (Stroke Patients Only)       Balance Overall balance assessment: Needs assistance Sitting-balance support: No upper extremity supported, Feet supported Sitting balance-Leahy Scale: Good     Standing balance support: Bilateral upper extremity supported, During functional activity, Reliant on assistive device for balance Standing balance-Leahy Scale: Poor                              Communication Communication Communication: Impaired Factors Affecting Communication: Difficulty expressing self;Reduced clarity of speech  Cognition Arousal: Alert Behavior During Therapy: WFL for tasks assessed/performed   PT - Cognitive impairments: History of cognitive impairments                       PT - Cognition Comments: Pt is A and O to self however only truely oriented x 2. Does follow simple verbal commands, pleasant and cooperative        Cueing  Cueing Techniques: Verbal cues, Tactile cues  Exercises Other Exercises Other Exercises: 5 times sit to stand, definite use of both hands needed, trouble with initial standing balance: 36.16 seconds    General Comments        Pertinent Vitals/Pain Pain Assessment Pain Assessment: No/denies pain    Home Living                          Prior  Function            PT Goals (current goals can now be found in the care plan section) Progress towards PT goals: Progressing toward goals    Frequency    Min 3X/week      PT Plan      Co-evaluation              AM-PAC PT "6 Clicks" Mobility   Outcome Measure  Help needed turning from your back to your side while in a flat bed without using bedrails?: A Little Help needed moving from lying on your back to sitting on the side of a flat bed without using bedrails?: A Little Help needed moving to and from a bed to a chair (including a wheelchair)?: A Little Help needed standing up from a chair using your arms (e.g., wheelchair or bedside chair)?: A Little Help needed to walk in hospital room?: A Little Help needed climbing 3-5 steps with a railing? : A Lot 6 Click Score: 17    End of Session Equipment Utilized During Treatment: Gait belt Activity Tolerance: Patient tolerated treatment well Patient left: in chair;with call bell/phone within reach;with chair alarm set Nurse Communication: Mobility status PT Visit Diagnosis: Hemiplegia and hemiparesis Hemiplegia - Right/Left: Right Hemiplegia - dominant/non-dominant: Dominant Hemiplegia - caused by: Cerebral infarction     Time: 1340-1356 PT Time Calculation (min) (ACUTE ONLY): 16 min  Charges:    $Gait Training: 8-22 mins PT General Charges $$ ACUTE PT VISIT: 1 Visit                     Darien Eden PT, DPT 2:04 PM,03/26/24

## 2024-03-27 DIAGNOSIS — Z86718 Personal history of other venous thrombosis and embolism: Secondary | ICD-10-CM

## 2024-03-27 DIAGNOSIS — I10 Essential (primary) hypertension: Secondary | ICD-10-CM | POA: Diagnosis not present

## 2024-03-27 DIAGNOSIS — Z8546 Personal history of malignant neoplasm of prostate: Secondary | ICD-10-CM

## 2024-03-27 DIAGNOSIS — R531 Weakness: Secondary | ICD-10-CM | POA: Diagnosis not present

## 2024-03-27 DIAGNOSIS — R200 Anesthesia of skin: Secondary | ICD-10-CM | POA: Diagnosis not present

## 2024-03-27 DIAGNOSIS — F0390 Unspecified dementia without behavioral disturbance: Secondary | ICD-10-CM

## 2024-03-27 DIAGNOSIS — I639 Cerebral infarction, unspecified: Secondary | ICD-10-CM | POA: Diagnosis not present

## 2024-03-27 NOTE — Progress Notes (Signed)
 Physical Therapy Treatment Patient Details Name: Ronnie Bullock MRN: [id number] DOB: January 30, 1946 Today's Date: 03/27/2024   History of Present Illness Pt is a 78 y.o. male being admitted with RLE weakness resulting in 6 falls. MRI brain: Small focus of acute ischemia within the posterior left frontal lobe, within the ACA territory. 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    PT Comments  Pt was finishing up OT session at start of PT session. He remains extremely pleasant and cooperative, however still lacks insight of current situation and deficits. Pt remains a high risk of falls. He tolerated ambulation with RW better than with only +1 UE support. Vcs for lateral wt shift and improved RLE progression. AFO ordered as requested post session to improve gait quality and footdrop concerns. Acute PT will continue to follow and progress per current POC.    If plan is discharge home, recommend the following: A little help with walking and/or transfers;A little help with bathing/dressing/bathroom     Equipment Recommendations  Other (comment) (Defer to next level of care)       Precautions / Restrictions Precautions Precautions: Fall Recall of Precautions/Restrictions: Impaired Restrictions Weight Bearing Restrictions Per Provider Order: No     Mobility  Bed Mobility    General bed mobility comments: Pt was standing with OT at start of PT session    Transfers Overall transfer level: Needs assistance Equipment used: Rolling walker (2 wheels) Transfers: Sit to/from Stand Sit to Stand: Contact guard assist, Min assist  General transfer comment: CGA from EOB/recliner however min assist from toilet surface    Ambulation/Gait Ambulation/Gait assistance: Contact guard assist, Min assist, Mod assist Gait Distance (Feet): 120 Feet Assistive device: Rolling walker (2 wheels), 1 person hand held assist Gait Pattern/deviations:  Step-through pattern, Trunk flexed, Staggering left, Staggering right, Decreased stride length  General Gait Details: Pt contiues to have RLE footdrop with circumduction gait pattern. Pt ambulates with CGA with RW however attempted gait with RUE only and then LUE only. Pt at much higher fall risk with RUE support only.   Balance Overall balance assessment: Needs assistance Sitting-balance support: No upper extremity supported, Feet supported Sitting balance-Leahy Scale: Fair     Standing balance support: Bilateral upper extremity supported, During functional activity, Reliant on assistive device for balance Standing balance-Leahy Scale: Poor      Communication Communication Communication: Impaired Factors Affecting Communication: Difficulty expressing self;Reduced clarity of speech  Cognition Arousal: Alert Behavior During Therapy: WFL for tasks assessed/performed   PT - Cognitive impairments: History of cognitive impairments    PT - Cognition Comments: Pt is A and O to self however only truely oriented x 2. Does follow simple verbal commands, pleasant and cooperative Following commands: Intact      Cueing Cueing Techniques: Verbal cues, Tactile cues         Pertinent Vitals/Pain Pain Assessment Pain Assessment: No/denies pain     PT Goals (current goals can now be found in the care plan section) Acute Rehab PT Goals Patient Stated Goal: none stated however pt is agreeable to rehab at DC Progress towards PT goals: Progressing toward goals    Frequency    Min 3X/week       Co-evaluation   Reason for Co-Treatment: To address functional/ADL transfers          AM-PAC PT "6 Clicks" Mobility   Outcome Measure  Help needed turning from your back to your side while in a  flat bed without using bedrails?: A Little Help needed moving from lying on your back to sitting on the side of a flat bed without using bedrails?: A Little Help needed moving to and from a bed to a  chair (including a wheelchair)?: A Little Help needed standing up from a chair using your arms (e.g., wheelchair or bedside chair)?: A Little Help needed to walk in hospital room?: A Lot Help needed climbing 3-5 steps with a railing? : A Lot 6 Click Score: 16    End of Session Equipment Utilized During Treatment: Gait belt;Other (comment) (Requested AFO order post session) Activity Tolerance: Patient tolerated treatment well Patient left: in chair;with call bell/phone within reach;with chair alarm set Nurse Communication: Mobility status PT Visit Diagnosis: Hemiplegia and hemiparesis Hemiplegia - Right/Left: Right Hemiplegia - dominant/non-dominant: Dominant Hemiplegia - caused by: Cerebral infarction     Time: 8295-6213 PT Time Calculation (min) (ACUTE ONLY): 31 min  Charges:    $Gait Training: 8-22 mins $Neuromuscular Re-education: 8-22 mins PT General Charges $$ ACUTE PT VISIT: 1 Visit                    Chester Costa PTA 03/27/24, 4:19 PM

## 2024-03-27 NOTE — TOC Progression Note (Signed)
 Transition of Care [facility]) - Progression Note    Patient Details  Name: Ronnie Bullock MRN: [id number] Date of Birth: November 17, 1945  Transition of [facility]) CM/SW Contact  Elsie Halo, RN Phone Number: 03/27/2024, 9:14 AM  Clinical Narrative:    TOC received a telephone call from the patient's sister, [name]  [phone] and the family chooses [employer]. TOC requested that [place] initiate insurance auth. TOC reached out to [place] [phone] at [facility] to notify him the family accepted. TOC will continue to follow.    Expected Discharge Plan: Skilled Nursing Facility Barriers to Discharge: Continued Medical Work up  Expected Discharge Plan and Services       Living arrangements for the past 2 months: Single Family Home                                       Social Determinants of Health (SDOH) Interventions SDOH Screenings   Food Insecurity: No Food Insecurity (03/22/2024)  Housing: Unknown (03/22/2024)  Transportation Needs: No Transportation Needs (03/22/2024)  Utilities: Not At Risk (03/22/2024)  Alcohol Screen: Low Risk  (08/02/2022)  Depression (PHQ2-9): Low Risk  (12/13/2023)  Financial Resource Strain: Low Risk  (09/05/2023)  Physical Activity: Inactive (08/02/2022)  Social Connections: Moderately Integrated (03/22/2024)  Stress: No Stress Concern Present (08/02/2022)  Tobacco Use: Low Risk  (03/21/2024)  Health Literacy: Adequate Health Literacy (06/28/2023)    Readmission Risk Interventions     No data to display

## 2024-03-27 NOTE — Plan of Care (Signed)
 Ronnie Bullock

## 2024-03-27 NOTE — Progress Notes (Signed)
 PROGRESS NOTE    Ronnie Bullock   ZOX:[id number] DOB: 18-Jun-1946  DOA: 03/21/2024 Date of Service: 03/27/24 which is hospital day 0  PCP: [name], DO    Hospital course / significant events:   HPI: Ronnie Bullock is a 78 y.o. 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  He had no injury related to the falls. Patient goes to adult daycare who called EMS to take him to the ED after they noticed that he was unable to participate as he usually does as his right leg kept giving away. Patient is unable to contribute to history due to dementia   05/15: CT head nonacute. admitted to hospitalist for CVA workup. MRI showing Small focus of acute ischemia within the posterior left frontal lobe, within the ACA territory.  05/16: PT/OT recs for SNF. Neuro consult. Echo pending. CTA H/N pending.  05/17: abd CTA H/N w/ L ACA abn liekly atherosclerotis but cannot r/o neurosyphilis / fibromuscular dysplasia, also saccular aneurysm - pending RPR -->  RPR and HIV NR. 05/18-05/20: Neuro s/o. 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 However, Ddx for rare causes would include neurosyphilis, FMD, mycotic vessel changes .  PHASES score for the aneurysm is 2, corresponding to  a 0.4% 5-year risk of aneurysm rupture.  Permissive hypertension window has passed and BP has been at goal, continue monitor VS routinely Statins w/ LDL goal less than 70 Continue Eliquis . No ASA/Plavix. If Eliquis  were to be d/c will need ASA  RPR and HIV NR. Fungal infection seems improbable given atherosclerotic disease far more likely and no other clinical suspicion for fungemia, will not get fungal culture at this time  Risk of aneurysm surgery outweighs benefits --> no surgical intervention  In patients with an ischemic stroke or TIA attributed to FMD, secondary prevention measures include antiplatelet therapy, blood pressure control, and lifestyle modification which is all in progress, if other concerns for fibromuscular dysplasia would work this up outpatient    Essential hypertension, benign held home antihypertensives (amlodipine ) for permissive hypertension and BP has been at goal, monitor VS and restart/adjust as needed    Chronic anticoagulation History of DVT LLE 2022 Continue Eliquis , however low risk DVT and high risk falls will d/w daughter re: continuation of this If d/c will need ASA as above for CVA    History of prostate cancer 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  Underweight - under 18  overweight - 25 to 29 obese - 30 or more Class 1 obesity: BMI of 30.0 to 34 Class 2 obesity: BMI of 35.0 to  39 Class 3 obesity: BMI of 40.0 to 49 Super Morbid Obesity: BMI 50-59 Super-super Morbid Obesity: BMI 60+ Healthy nutrition and physical activity advised as adjunct to other disease management and risk reduction treatments    DVT prophylaxis: Eliquis   IV fluids: no continuous  IV fluids  Nutrition: regular diet  Central lines / other devices: none  Code Status: FULL CODE ACP documentation reviewed:  none on file in [facility]  TOC needs: SNF placement  Medical barriers to dispo: none at this time      Subjective / Brief ROS:  Patient was seen and examined today.  He was pleasantly confused and no new concern.  Family Communication:    Objective Findings:  Vitals:   03/26/24 1533 03/26/24 1951 03/27/24 0412 03/27/24 0758  BP: 106/68 121/81 (!) 127/97 134/75  Pulse: 90 84 84 81  Resp: 15 18 18 16   Temp: 98 F (36.7 C) 99.4 F (37.4 C) 98.2 F (36.8 C) 97.9 F (36.6 C)  TempSrc:  Oral Oral   SpO2: 98% 95% 94% 95%  Weight:      Height:        Intake/Output Summary (Last 24 hours) at 03/27/2024 1325 Last data filed at 03/27/2024 1035 Gross per 24 hour  Intake 600 ml  Output 1050 ml  Net -450 ml   Filed Weights   03/21/24 1647 03/21/24 1649  Weight: 68 kg 68 kg    Examination:  General.  Frail elderly man, in no acute distress. Pulmonary.  Lungs clear bilaterally, normal respiratory effort. CV.  Regular rate and rhythm, no JVD, rub or murmur. Abdomen.  Soft, nontender, nondistended, BS positive. CNS.  Alert and little confused.  Right lower extremity weakness Extremities.  No edema, pulses intact and symmetrical.   Scheduled Medications:    stroke: early stages of recovery book   Does not apply Once   apixaban   5 mg Oral BID   donepezil   5 mg Oral QHS   tamsulosin   0.8 mg Oral Daily    Continuous Infusions:   PRN Medications:  acetaminophen  **OR** acetaminophen  (TYLENOL ) oral liquid 160 mg/5 mL **OR** acetaminophen   Antimicrobials from admission:  Anti-infectives (From admission, onward)    None      Data Reviewed:  I have personally reviewed the following...  CBC: Recent Labs  Lab 03/21/24 1651  WBC 5.0  NEUTROABS 2.4  HGB 13.0  HCT 39.6  MCV 91.2  PLT 210   Basic Metabolic Panel: Recent Labs  Lab  03/21/24 1651  NA 137  K 4.0  CL 105  CO2 23  GLUCOSE 141*  BUN 19  CREATININE 1.15  CALCIUM  8.9   GFR: Estimated Creatinine Clearance: 47 mL/min (by C-G formula based on SCr of 1.15 mg/dL). Liver Function Tests: Recent Labs  Lab 03/21/24 1651  AST 20  ALT 10  ALKPHOS 68  BILITOT 0.8  PROT 7.5  ALBUMIN 3.1*   No results for input(s): "LIPASE", "AMYLASE" in the last 168 hours. No results for input(s): "AMMONIA" in the last 168 hours. Coagulation Profile: Recent Labs  Lab 03/21/24 1651  INR 1.1   Cardiac Enzymes: No results for input(s): "CKTOTAL", "CKMB", "CKMBINDEX", "TROPONINI" in the last 168 hours. BNP (last 3 results) No results for input(s): "PROBNP" in the last 8760 hours. HbA1C: No results for input(s): "HGBA1C" in the last 72 hours.  CBG: No results for input(s): "GLUCAP" in the last 168 hours. Lipid Profile: No results for input(s): "CHOL", "HDL", "LDLCALC", "TRIG", "CHOLHDL", "LDLDIRECT" in  the last 72 hours.  Thyroid  Function Tests: No results for input(s): "TSH", "T4TOTAL", "FREET4", "T3FREE", "THYROIDAB" in the last 72 hours. Anemia Panel: No results for input(s): "VITAMINB12", "FOLATE", "FERRITIN", "TIBC", "IRON", "RETICCTPCT" in the last 72 hours. Most Recent Urinalysis On File:     Component Value Date/Time   COLORURINE YELLOW (A) 02/12/2021 1658   APPEARANCEUR Clear 11/06/2023 1056   LABSPEC 1.017 02/12/2021 1658   PHURINE 5.0 02/12/2021 1658   GLUCOSEU Negative 11/06/2023 1056   HGBUR NEGATIVE 02/12/2021 1658   BILIRUBINUR Negative 11/06/2023 1056   KETONESUR NEGATIVE 02/12/2021 1658   PROTEINUR 3+ (A) 11/06/2023 1056   PROTEINUR 100 (A) 02/12/2021 1658   UROBILINOGEN 0.2 10/30/2020 1403   NITRITE Negative 11/06/2023 1056   NITRITE NEGATIVE 02/12/2021 1658   LEUKOCYTESUR Negative 11/06/2023 1056   LEUKOCYTESUR NEGATIVE 02/12/2021 1658   Sepsis Labs: @LABRCNTIP (procalcitonin:4,lacticidven:4) Microbiology: No results found for this  or any previous visit (from the past 240 hours).    Radiology Studies last 3 days: No results found.  Luna Salinas, MD [facility] 03/27/2024, 1:25 PM    Dictation software may have been used to generate the above note. Typos may occur and escape review in typed/dictated notes. Please contact Dr Authur Leghorn directly for clarity if needed.  Staff may message me via secure chat in Epic  but this may not receive an immediate response,  please page me for urgent matters!  If 7PM-7AM, please contact night coverage [email or web address]

## 2024-03-27 NOTE — Progress Notes (Signed)
 Occupational Therapy Treatment Patient Details Name: Ronnie Bullock MRN: [id number] DOB: 26-Jun-1946 Today's Date: 03/27/2024   History of present illness Pt is a 78 y.o. male being admitted with RLE weakness resulting in 6 falls. MRI brain: Small focus of acute ischemia within the posterior left frontal lobe, within the ACA territory. 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   OT comments  Chart reviewed to date, pt greeted in chair, oriented to self and place, dysarthria noted. Pt is agreeable to OT tx session targeting improving functional activity tolerance in preparation for ADL tasks. STS completed with CGA-MIN A, amb with RW approx 10' to sink with CGA-MIN A, standing at sink for standing grooming tasks with RW for approx 3 minutes with CGA. Hand off then provided to PT for additional gait training. Pt is making progress towards goals, discharge recommendation remains appropriate. OT will continue to follow.       If plan is discharge home, recommend the following:  A little help with walking and/or transfers;A lot of help with bathing/dressing/bathroom;Direct supervision/assist for financial management;Supervision due to cognitive status;Help with stairs or ramp for entrance   Equipment Recommendations  Other (comment) (defer to next venue of care)    Recommendations for Other Services      Precautions / Restrictions Precautions Precautions: Fall Recall of Precautions/Restrictions: Impaired Restrictions Weight Bearing Restrictions Per Provider Order: No       Mobility Bed Mobility               General bed mobility comments: NT in recliner pre/with PT post session    Transfers Overall transfer level: Needs assistance Equipment used: Rolling walker (2 wheels) Transfers: Sit to/from Stand Sit to Stand: Contact guard assist, Min assist                 Balance Overall balance assessment: Needs  assistance Sitting-balance support: No upper extremity supported, Feet supported Sitting balance-Leahy Scale: Good     Standing balance support: Bilateral upper extremity supported, During functional activity, Reliant on assistive device for balance Standing balance-Leahy Scale: Poor                             ADL either performed or assessed with clinical judgement   ADL Overall ADL's : Needs assistance/impaired     Grooming: Wash/dry hands;Wash/dry face;Standing;Contact guard assist Grooming Details (indicate cue type and reason): with RW at sink level                 Toilet Transfer: Minimal assistance;Rollator (4 wheels);Cueing for sequencing Toilet Transfer Details (indicate cue type and reason): simulated, cues to lift RLE off floor         Functional mobility during ADLs: Minimal assistance;Rolling walker (2 wheels) (approx 10' in room, then farther with PT-refer to PT note)      Extremity/Trunk Assessment              Vision       Perception     Praxis     Communication Communication Communication: Impaired Factors Affecting Communication: Difficulty expressing self;Reduced clarity of speech   Cognition Arousal: Alert Behavior During Therapy: WFL for tasks assessed/performed Cognition: History of cognitive impairments, Cognition impaired       Memory impairment (select all impairments): Declarative long-term memory Attention impairment (select first level of impairment): Sustained attention Executive functioning impairment (select all impairments): Problem solving, Reasoning  Following commands: Intact        Cueing   Cueing Techniques: Verbal cues, Tactile cues  Exercises Other Exercises Other Exercises: edu re: role of OT, role of rehab, discharge recommendations    Shoulder Instructions       General Comments      Pertinent Vitals/ Pain       Pain Assessment Pain Assessment: No/denies  pain  Home Living                                          Prior Functioning/Environment              Frequency  Min 3X/week        Progress Toward Goals  OT Goals(current goals can now be found in the care plan section)  Progress towards OT goals: Progressing toward goals  Acute Rehab OT Goals Time For Goal Achievement: 04/05/24  Plan      Co-evaluation    PT/OT/SLP Co-Evaluation/Treatment: Yes (close chair follow initially for mobility with no AD/hand off) Reason for Co-Treatment: To address functional/ADL transfers          AM-PAC OT "6 Clicks" Daily Activity     Outcome Measure   Help from another person eating meals?: None Help from another person taking care of personal grooming?: A Little Help from another person toileting, which includes using toliet, bedpan, or urinal?: A Little Help from another person bathing (including washing, rinsing, drying)?: A Lot Help from another person to put on and taking off regular upper body clothing?: None Help from another person to put on and taking off regular lower body clothing?: A Lot 6 Click Score: 18    End of Session Equipment Utilized During Treatment: Gait belt;Rolling walker (2 wheels)  OT Visit Diagnosis: Other abnormalities of gait and mobility (R26.89);Muscle weakness (generalized) (M62.81);Unsteadiness on feet (R26.81)   Activity Tolerance Patient tolerated treatment well   Patient Left in chair;with call bell/phone within reach;with chair alarm set   Nurse Communication Mobility status        Time: 1610-9604 OT Time Calculation (min): 14 min  Charges: OT General Charges $OT Visit: 1 Visit OT Treatments $Therapeutic Activity: 8-22 mins  Gerre Kraft, OTD OTR/L  03/27/24, 3:26 PM

## 2024-03-27 NOTE — Plan of Care (Signed)
  Problem: Education: Goal: Knowledge of disease or condition will improve Outcome: Progressing Goal: Knowledge of secondary prevention will improve (MUST DOCUMENT ALL) Outcome: Progressing Goal: Knowledge of patient specific risk factors will improve (DELETE if not current risk factor) Outcome: Progressing   Problem: Ischemic Stroke/TIA Tissue Perfusion: Goal: Complications of ischemic stroke/TIA will be minimized Outcome: Progressing   Problem: Coping: Goal: Will verbalize positive feelings about self Outcome: Progressing Goal: Will identify appropriate support needs Outcome: Progressing   Problem: Health Behavior/Discharge Planning: Goal: Ability to manage health-related needs will improve Outcome: Progressing Goal: Goals will be collaboratively established with patient/family Outcome: Progressing   Problem: Self-Care: Goal: Ability to participate in self-care as condition permits will improve Outcome: Progressing Goal: Verbalization of feelings and concerns over difficulty with self-care will improve Outcome: Progressing Goal: Ability to communicate needs accurately will improve Outcome: Progressing   Problem: Nutrition: Goal: Risk of aspiration will decrease Outcome: Progressing Goal: Dietary intake will improve Outcome: Progressing   Problem: Education: Goal: Knowledge of General Education information will improve Description: Including pain rating scale, medication(s)/side effects and non-pharmacologic comfort measures Outcome: Progressing   Problem: Health Behavior/Discharge Planning: Goal: Ability to manage health-related needs will improve Outcome: Progressing   Problem: Clinical Measurements: Goal: Ability to maintain clinical measurements within normal limits will improve Outcome: Progressing Goal: Will remain free from infection Outcome: Progressing Goal: Diagnostic test results will improve Outcome: Progressing Goal: Respiratory complications will  improve Outcome: Progressing Goal: Cardiovascular complication will be avoided Outcome: Progressing   Problem: Activity: Goal: Risk for activity intolerance will decrease Outcome: Progressing   Problem: Nutrition: Goal: Adequate nutrition will be maintained Outcome: Progressing   Problem: Coping: Goal: Level of anxiety will decrease Outcome: Progressing   Problem: Pain Managment: Goal: General experience of comfort will improve and/or be controlled Outcome: Progressing   Problem: Safety: Goal: Ability to remain free from injury will improve Outcome: Progressing   Problem: Skin Integrity: Goal: Risk for impaired skin integrity will decrease Outcome: Progressing

## 2024-03-28 ENCOUNTER — Ambulatory Visit: Admitting: Family Medicine

## 2024-03-28 DIAGNOSIS — I639 Cerebral infarction, unspecified: Secondary | ICD-10-CM | POA: Diagnosis not present

## 2024-03-28 DIAGNOSIS — I10 Essential (primary) hypertension: Secondary | ICD-10-CM | POA: Diagnosis not present

## 2024-03-28 DIAGNOSIS — R200 Anesthesia of skin: Secondary | ICD-10-CM | POA: Diagnosis not present

## 2024-03-28 DIAGNOSIS — R531 Weakness: Secondary | ICD-10-CM | POA: Diagnosis not present

## 2024-03-28 NOTE — TOC Progression Note (Signed)
 Transition of Care [facility]) - Progression Note    Patient Details  Name: Ronnie Bullock MRN: [id number] Date of Birth: 03/27/1946  [facility] of [facility]) CM/SW Contact  Elsie Halo, RN Phone Number: 03/28/2024, 2:44 PM  Clinical Narrative:     Received insurance [employer] for [employer]; Approved PlanAuthID:[id number] Dates: 5/22-5/27/2025 Next Review Date: 04/02/2024   [facility] outreached to Fabian Holster [phone] at [facility] to notify of auth.Fabian Holster is checking to see if has any male beds available and will f/u with TOC. If no beds available today he should have beds available on Saturday.   Expected Discharge Plan: Skilled Nursing Facility Barriers to Discharge: Continued Medical Work up  Expected Discharge Plan and Services       Living arrangements for the past 2 months: Single Family Home                                       Social Determinants of Health (SDOH) Interventions SDOH Screenings   Food Insecurity: No Food Insecurity (03/22/2024)  Housing: Unknown (03/22/2024)  Transportation Needs: No Transportation Needs (03/22/2024)  Utilities: Not At Risk (03/22/2024)  Alcohol Screen: Low Risk  (08/02/2022)  Depression (PHQ2-9): Low Risk  (12/13/2023)  Financial Resource Strain: Low Risk  (09/05/2023)  Physical Activity: Inactive (08/02/2022)  Social Connections: Moderately Integrated (03/22/2024)  Stress: No Stress Concern Present (08/02/2022)  Tobacco Use: Low Risk  (03/21/2024)  Health Literacy: Adequate Health Literacy (06/28/2023)    Readmission Risk Interventions     No data to display

## 2024-03-28 NOTE — Progress Notes (Cosign Needed)
 Patient appears in no distress or pain. IV flushed; IV dressing changed. Ambulated in the hallway with PT; obtained shoe brace for prevention of foot drop on right foot; up to the sink with OT, without brace, for oral care and face washing tasks with some cuing related to foot and RW placement. Returned and remains in chair with chair alarm in place. Call bell within reach.

## 2024-03-28 NOTE — Progress Notes (Signed)
 Occupational Therapy Treatment Patient Details Name: Ronnie Bullock MRN: [id number] DOB: [birth year] Today's Date: 03/28/2024   History of present illness Pt is a 78 y.o. male being admitted with RLE weakness resulting in 6 falls. MRI brain: Small focus of acute ischemia within the posterior left frontal lobe, within the ACA territory. 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   OT comments   Patient seen on this session for ADL task engagement.  Sit to stand transfer to RW with CGA, cuing for safety with RW management and hand placement.  Completed functional mobility to sink at RW level with CGA with cuing for foot placement and management or RW in small spaces.  Patient stood at sink to complete oral care and face washing tasks with decreased functional balance with patient always requiring UE support at all times, CGA for all standing components of ADLs.  At end of session, patient returned to recliner chair with BLEs elevated on foot rest, call button within reach, chair alarm on, and Nurse Aide present at bedside.        If plan is discharge home, recommend the following:  A little help with walking and/or transfers;A lot of help with bathing/dressing/bathroom;Direct supervision/assist for financial management;Supervision due to cognitive status;Help with stairs or ramp for entrance   Equipment Recommendations       Recommendations for Other Services      Precautions / Restrictions Precautions Precautions: Fall Recall of Precautions/Restrictions: Impaired Restrictions Weight Bearing Restrictions Per Provider Order: No       Mobility Bed Mobility                    Transfers Overall transfer level: Needs assistance Equipment used: Rolling walker (2 wheels) Transfers: Sit to/from Stand Sit to Stand: Contact guard assist           General transfer comment: CGA from EOB/recliner     Balance Overall  balance assessment: Needs assistance Sitting-balance support: No upper extremity supported, Feet supported Sitting balance-Leahy Scale: Fair     Standing balance support: Bilateral upper extremity supported, During functional activity, Reliant on assistive device for balance Standing balance-Leahy Scale: Poor                             ADL either performed or assessed with clinical judgement   ADL Overall ADL's : Needs assistance/impaired     Grooming: Wash/dry hands;Wash/dry face;Standing;Contact guard assist Grooming Details (indicate cue type and reason): with RW at sink level, cuing required for maintaining upright standing posture while utilizing BUEs for grooming tasks             Lower Body Dressing: Sitting/lateral leans;Minimal assistance Lower Body Dressing Details (indicate cue type and reason): doffing/donning socks while seated, doff/don L sock with SBA, doff/don R sock Min A with lateral leaning and forward lean while seated for task engagement.             Functional mobility during ADLs: Minimal assistance;Rolling walker (2 wheels) General ADL Comments: cuing for RLE foot placement and safe management of RW through functional mobility related to ADLs.    Extremity/Trunk Assessment              Vision       Perception     Praxis     Communication     Cognition Arousal: Alert Behavior During Therapy: WFL for tasks assessed/performed Cognition:  History of cognitive impairments, Cognition impaired       Memory impairment (select all impairments): Declarative long-term memory Attention impairment (select first level of impairment): Sustained attention                              Cueing      Exercises      Shoulder Instructions       General Comments      Pertinent Vitals/ Pain       Pain Assessment Pain Assessment: No/denies pain  Home Living                                           Prior Functioning/Environment              Frequency  Min 3X/week        Progress Toward Goals  OT Goals(current goals can now be found in the care plan section)  Progress towards OT goals: Progressing toward goals     Plan      Co-evaluation                 AM-PAC OT "6 Clicks" Daily Activity     Outcome Measure   Help from another person eating meals?: None Help from another person taking care of personal grooming?: A Little Help from another person toileting, which includes using toliet, bedpan, or urinal?: A Little Help from another person bathing (including washing, rinsing, drying)?: A Lot Help from another person to put on and taking off regular upper body clothing?: None Help from another person to put on and taking off regular lower body clothing?: A Lot 6 Click Score: 18    End of Session Equipment Utilized During Treatment: Gait belt;Rolling walker (2 wheels)  OT Visit Diagnosis: Other abnormalities of gait and mobility (R26.89);Muscle weakness (generalized) (M62.81);Unsteadiness on feet (R26.81)   Activity Tolerance Patient tolerated treatment well   Patient Left in chair;with call bell/phone within reach;with chair alarm set;with nursing/sitter in room (Nurse tech in bedroom attending to patient at end of OT session)   Nurse Communication          Time: [phone] OT Time Calculation (min): 14 min  Charges: OT General Charges $OT Visit: 1 Visit  Sherial Dimes OTR/L   Lucas Rushing 03/28/2024, 12:02 PM

## 2024-03-28 NOTE — Progress Notes (Signed)
 Mobility Specialist - Progress Note     03/28/24 1102  Mobility  Activity Ambulated with assistance in room;Stood at bedside;Dangled on edge of bed  Level of Assistance Standby assist, set-up cues, supervision of patient - no hands on  Assistive Device Front wheel walker;Other (Comment) (AFO in boot)  Distance Ambulated (ft) 120 ft  Range of Motion/Exercises Active  Activity Response Tolerated well  Mobility Referral Yes  Mobility visit 1 Mobility  Mobility Specialist Start Time (ACUTE ONLY) 1032  Mobility Specialist Stop Time (ACUTE ONLY) 1058  Mobility Specialist Time Calculation (min) (ACUTE ONLY) 26 min   Pt resting in bed on RA upon entry. Pt performs bed mobility modI utilizing railing for support. Pt dons AFO in boot with assistance from PTA EOB. Pt STS and ambulates to hallway around NS SBA with RW. Pt given verbal cuing for hand placement on the walk and to slow down to not follow over walker. Pt also requires extra time during turns to prevent fall. Pt is incontinent  (says no due to aphasia). Pt took x1 standing rest break and and x1 seated rest break before being rolled back to room. Pt left in recliner with needs in reach. Chair alarm activated.   [name] Mobility Specialist 03/28/24, 11:09 AM

## 2024-03-28 NOTE — Plan of Care (Signed)
 Problem: Education: Goal: Knowledge of disease or condition will improve 03/28/2024 0524 by Amedeo Bailiff, RN Outcome: Progressing 03/27/2024 2145 by Amedeo Bailiff, RN Outcome: Progressing Goal: Knowledge of secondary prevention will improve (MUST DOCUMENT ALL) 03/28/2024 0524 by Amedeo Bailiff, RN Outcome: Progressing 03/27/2024 2145 by Amedeo Bailiff, RN Outcome: Progressing Goal: Knowledge of patient specific risk factors will improve (DELETE if not current risk factor) 03/28/2024 0524 by Amedeo Bailiff, RN Outcome: Progressing 03/27/2024 2145 by Amedeo Bailiff, RN Outcome: Progressing   Problem: Ischemic Stroke/TIA Tissue Perfusion: Goal: Complications of ischemic stroke/TIA will be minimized 03/28/2024 0524 by Amedeo Bailiff, RN Outcome: Progressing 03/27/2024 2145 by Amedeo Bailiff, RN Outcome: Progressing   Problem: Coping: Goal: Will verbalize positive feelings about self 03/28/2024 0524 by Amedeo Bailiff, RN Outcome: Progressing 03/27/2024 2145 by Amedeo Bailiff, RN Outcome: Progressing Goal: Will identify appropriate support needs 03/28/2024 0524 by Amedeo Bailiff, RN Outcome: Progressing 03/27/2024 2145 by Amedeo Bailiff, RN Outcome: Progressing   Problem: Health Behavior/Discharge Planning: Goal: Ability to manage health-related needs will improve 03/28/2024 0524 by Amedeo Bailiff, RN Outcome: Progressing 03/27/2024 2145 by Amedeo Bailiff, RN Outcome: Progressing Goal: Goals will be collaboratively established with patient/family 03/28/2024 0524 by Amedeo Bailiff, RN Outcome: Progressing 03/27/2024 2145 by Amedeo Bailiff, RN Outcome: Progressing   Problem: Self-Care: Goal: Ability to participate in self-care as condition permits will improve 03/28/2024 0524 by Amedeo Bailiff, RN Outcome: Progressing 03/27/2024 2145 by Amedeo Bailiff,  RN Outcome: Progressing Goal: Verbalization of feelings and concerns over difficulty with self-care will improve 03/28/2024 0524 by Amedeo Bailiff, RN Outcome: Progressing 03/27/2024 2145 by Amedeo Bailiff, RN Outcome: Progressing Goal: Ability to communicate needs accurately will improve 03/28/2024 0524 by Amedeo Bailiff, RN Outcome: Progressing 03/27/2024 2145 by Amedeo Bailiff, RN Outcome: Progressing   Problem: Nutrition: Goal: Risk of aspiration will decrease 03/28/2024 0524 by Amedeo Bailiff, RN Outcome: Progressing 03/27/2024 2145 by Amedeo Bailiff, RN Outcome: Progressing Goal: Dietary intake will improve 03/28/2024 0524 by Amedeo Bailiff, RN Outcome: Progressing 03/27/2024 2145 by Amedeo Bailiff, RN Outcome: Progressing   Problem: Education: Goal: Knowledge of General Education information will improve Description: Including pain rating scale, medication(s)/side effects and non-pharmacologic comfort measures 03/28/2024 0524 by Amedeo Bailiff, RN Outcome: Progressing 03/27/2024 2145 by Amedeo Bailiff, RN Outcome: Progressing   Problem: Clinical Measurements: Goal: Ability to maintain clinical measurements within normal limits will improve 03/28/2024 0524 by Amedeo Bailiff, RN Outcome: Progressing 03/27/2024 2145 by Amedeo Bailiff, RN Outcome: Progressing Goal: Will remain free from infection 03/28/2024 0524 by Amedeo Bailiff, RN Outcome: Progressing 03/27/2024 2145 by Amedeo Bailiff, RN Outcome: Progressing Goal: Diagnostic test results will improve 03/28/2024 0524 by Amedeo Bailiff, RN Outcome: Progressing 03/27/2024 2145 by Amedeo Bailiff, RN Outcome: Progressing Goal: Respiratory complications will improve 03/28/2024 0524 by Amedeo Bailiff, RN Outcome: Progressing 03/27/2024 2145 by Amedeo Bailiff, RN Outcome: Progressing Goal: Cardiovascular  complication will be avoided 03/28/2024 0524 by Amedeo Bailiff, RN Outcome: Progressing 03/27/2024 2145 by Amedeo Bailiff, RN Outcome: Progressing   Problem: Activity: Goal: Risk for activity intolerance will decrease 03/28/2024 0524 by Amedeo Bailiff, RN Outcome: Progressing 03/27/2024 2145 by Amedeo Bailiff, RN Outcome: Progressing   Problem: Nutrition: Goal: Adequate nutrition will be maintained 03/28/2024 0524 by Amedeo Bailiff, RN Outcome: Progressing 03/27/2024 2145 by Amedeo Bailiff, RN  Outcome: Progressing   Problem: Coping: Goal: Level of anxiety will decrease 03/28/2024 0524 by Amedeo Bailiff, RN Outcome: Progressing 03/27/2024 2145 by Amedeo Bailiff, RN Outcome: Progressing   Problem: Elimination: Goal: Will not experience complications related to bowel motility 03/28/2024 0524 by Amedeo Bailiff, RN Outcome: Progressing 03/27/2024 2145 by Amedeo Bailiff, RN Outcome: Progressing Goal: Will not experience complications related to urinary retention 03/28/2024 0524 by Amedeo Bailiff, RN Outcome: Progressing 03/27/2024 2145 by Amedeo Bailiff, RN Outcome: Progressing   Problem: Pain Managment: Goal: General experience of comfort will improve and/or be controlled 03/28/2024 0524 by Amedeo Bailiff, RN Outcome: Progressing 03/27/2024 2145 by Amedeo Bailiff, RN Outcome: Progressing   Problem: Safety: Goal: Ability to remain free from injury will improve 03/28/2024 0524 by Amedeo Bailiff, RN Outcome: Progressing 03/27/2024 2145 by Amedeo Bailiff, RN Outcome: Progressing   Problem: Skin Integrity: Goal: Risk for impaired skin integrity will decrease 03/28/2024 0524 by Amedeo Bailiff, RN Outcome: Progressing 03/27/2024 2145 by Amedeo Bailiff, RN Outcome: Progressing

## 2024-03-28 NOTE — Plan of Care (Signed)
 Pt alert and oriented, denies any c/o pain. Pt has weakness to rt leg and minimal to rt hand. Pt sitting up in chair. Good appetite swallowing well. Call light in reach for safety.  Problem: Education: Goal: Knowledge of disease or condition will improve Outcome: Progressing Goal: Knowledge of secondary prevention will improve (MUST DOCUMENT ALL) Outcome: Progressing Goal: Knowledge of patient specific risk factors will improve (DELETE if not current risk factor) Outcome: Progressing   Problem: Ischemic Stroke/TIA Tissue Perfusion: Goal: Complications of ischemic stroke/TIA will be minimized Outcome: Progressing   Problem: Coping: Goal: Will verbalize positive feelings about self Outcome: Progressing Goal: Will identify appropriate support needs Outcome: Progressing   Problem: Health Behavior/Discharge Planning: Goal: Ability to manage health-related needs will improve Outcome: Progressing Goal: Goals will be collaboratively established with patient/family Outcome: Progressing   Problem: Self-Care: Goal: Ability to participate in self-care as condition permits will improve Outcome: Progressing Goal: Verbalization of feelings and concerns over difficulty with self-care will improve Outcome: Progressing Goal: Ability to communicate needs accurately will improve Outcome: Progressing   Problem: Nutrition: Goal: Risk of aspiration will decrease Outcome: Progressing Goal: Dietary intake will improve Outcome: Progressing   Problem: Education: Goal: Knowledge of General Education information will improve Description: Including pain rating scale, medication(s)/side effects and non-pharmacologic comfort measures Outcome: Progressing   Problem: Health Behavior/Discharge Planning: Goal: Ability to manage health-related needs will improve Outcome: Progressing   Problem: Clinical Measurements: Goal: Ability to maintain clinical measurements within normal limits will  improve Outcome: Progressing Goal: Will remain free from infection Outcome: Progressing Goal: Diagnostic test results will improve Outcome: Progressing Goal: Respiratory complications will improve Outcome: Progressing Goal: Cardiovascular complication will be avoided Outcome: Progressing   Problem: Activity: Goal: Risk for activity intolerance will decrease Outcome: Progressing   Problem: Nutrition: Goal: Adequate nutrition will be maintained Outcome: Progressing   Problem: Coping: Goal: Level of anxiety will decrease Outcome: Progressing   Problem: Elimination: Goal: Will not experience complications related to bowel motility Outcome: Progressing Goal: Will not experience complications related to urinary retention Outcome: Progressing   Problem: Pain Managment: Goal: General experience of comfort will improve and/or be controlled Outcome: Progressing   Problem: Safety: Goal: Ability to remain free from injury will improve Outcome: Progressing   Problem: Skin Integrity: Goal: Risk for impaired skin integrity will decrease Outcome: Progressing

## 2024-03-28 NOTE — Progress Notes (Signed)
 PROGRESS NOTE    Ronnie Bullock   ZOX:[id number] DOB: March 01, 1946  DOA: 03/21/2024 Date of Service: 03/28/24 which is hospital day 0  PCP: [name], DO    Hospital course / significant events:   HPI: Ronnie Bullock is a 78 y.o. 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  He had no injury related to the falls. Patient goes to adult daycare who called EMS to take him to the ED after they noticed that he was unable to participate as he usually does as his right leg kept giving away. Patient is unable to contribute to history due to dementia   05/15: CT head nonacute. admitted to hospitalist for CVA workup. MRI showing Small focus of acute ischemia within the posterior left frontal lobe, within the ACA territory.  05/16: PT/OT recs for SNF. Neuro consult. Echo pending. CTA H/N pending.  05/17: abd CTA H/N w/ L ACA abn liekly atherosclerotis but cannot r/o neurosyphilis / fibromuscular dysplasia, also saccular aneurysm - pending RPR -->  RPR and HIV NR. 05/18-05/20: Neuro s/o. 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 However, Ddx for rare causes would include neurosyphilis, FMD, mycotic vessel  changes .  PHASES score for the aneurysm is 2, corresponding to a 0.4% 5-year risk of aneurysm rupture.  Permissive hypertension window has passed and BP has been at goal, continue monitor VS routinely Statins w/ LDL goal less than 70 Continue Eliquis . No ASA/Plavix. If Eliquis  were to be d/c will need ASA  RPR and HIV NR. Fungal infection seems improbable given atherosclerotic disease far more likely and no other clinical suspicion for fungemia, will not get fungal culture at this time  Risk of aneurysm surgery outweighs benefits --> no surgical intervention  In patients with an ischemic stroke or TIA attributed to FMD, secondary prevention measures include antiplatelet therapy, blood pressure control, and lifestyle modification which is all in progress, if other concerns for fibromuscular dysplasia would work this up outpatient    Essential hypertension, benign held home antihypertensives (amlodipine ) for permissive hypertension and BP has been at goal, monitor VS and restart/adjust as needed    Chronic anticoagulation History of DVT LLE 2022 Continue Eliquis , however low risk DVT and high risk falls will d/w daughter re: continuation of this If d/c will need ASA as above for CVA    History of prostate cancer 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  Underweight - under 18  overweight - 25 to 29 obese - 30 or more Class 1 obesity: BMI of 30.0 to 34 Class  2 obesity: BMI of 35.0 to 39 Class 3 obesity: BMI of 40.0 to 49 Super Morbid Obesity: BMI 50-59 Super-super Morbid Obesity: BMI 60+ Healthy nutrition and physical activity advised as adjunct to other disease management and risk reduction  treatments    DVT prophylaxis: Eliquis   IV fluids: no continuous IV fluids  Nutrition: regular diet  Central lines / other devices: none  Code Status: FULL CODE ACP documentation reviewed:  none on file in [facility]  TOC needs: SNF placement  Medical barriers to dispo: none at this time      Subjective / Brief ROS:  Patient was sitting in chair comfortably when seen today.  No new concern.  Family Communication:    Objective Findings:  Vitals:   03/28/24 0004 03/28/24 0342 03/28/24 0721 03/28/24 1149  BP: (!) 128/93 123/89 121/85 108/70  Pulse: 81 86 86 93  Resp: 18 18 18 18   Temp: 98.2 F (36.8 C) 98 F (36.7 C) 98.6 F (37 C) 97.6 F (36.4 C)  TempSrc:   Oral Oral  SpO2: 100% 95% 97% 95%  Weight:      Height:        Intake/Output Summary (Last 24 hours) at 03/28/2024 1420 Last data filed at 03/28/2024 1100 Gross per 24 hour  Intake 1160 ml  Output 975 ml  Net 185 ml   Filed Weights   03/21/24 1647 03/21/24 1649  Weight: 68 kg 68 kg    Examination:  General.  Frail elderly man, in no acute distress. Pulmonary.  Lungs clear bilaterally, normal respiratory effort. CV.  Regular rate and rhythm, no JVD, rub or murmur. Abdomen.  Soft, nontender, nondistended, BS positive. CNS.  Alert and oriented .  No new focal neurologic deficit. Extremities.  No edema, no cyanosis, pulses intact and symmetrical.   Scheduled Medications:    stroke: early stages of recovery book   Does not apply Once   apixaban   5 mg Oral BID   donepezil   5 mg Oral QHS   tamsulosin   0.8 mg Oral Daily     PRN Medications:  acetaminophen  **OR** acetaminophen  (TYLENOL ) oral liquid 160 mg/5 mL **OR** acetaminophen   Antimicrobials from admission:  Anti-infectives (From admission, onward)    None      Data Reviewed:  I have personally reviewed the following...  CBC: Recent Labs  Lab 03/21/24 1651  WBC 5.0  NEUTROABS 2.4  HGB 13.0  HCT 39.6  MCV 91.2  PLT 210   Basic  Metabolic Panel: Recent Labs  Lab 03/21/24 1651  NA 137  K 4.0  CL 105  CO2 23  GLUCOSE 141*  BUN 19  CREATININE 1.15  CALCIUM  8.9   GFR: Estimated Creatinine Clearance: 47 mL/min (by C-G formula based on SCr of 1.15 mg/dL). Liver Function Tests: Recent Labs  Lab 03/21/24 1651  AST 20  ALT 10  ALKPHOS 68  BILITOT 0.8  PROT 7.5  ALBUMIN 3.1*   No results for input(s): "LIPASE", "AMYLASE" in the last 168 hours. No results for input(s): "AMMONIA" in the last 168 hours. Coagulation Profile: Recent Labs  Lab 03/21/24 1651  INR 1.1   Cardiac Enzymes: No results for input(s): "CKTOTAL", "CKMB", "CKMBINDEX", "TROPONINI" in the last 168 hours. BNP (last 3 results) No results for input(s): "PROBNP" in the last 8760 hours. HbA1C: No results for input(s): "HGBA1C" in the last 72 hours.  CBG: No results for input(s): "GLUCAP" in the last 168 hours. Lipid Profile: No results for input(s): "CHOL", "HDL", "LDLCALC", "  TRIG", "CHOLHDL", "LDLDIRECT" in the last 72 hours.  Thyroid  Function Tests: No results for input(s): "TSH", "T4TOTAL", "FREET4", "T3FREE", "THYROIDAB" in the last 72 hours. Anemia Panel: No results for input(s): "VITAMINB12", "FOLATE", "FERRITIN", "TIBC", "IRON", "RETICCTPCT" in the last 72 hours. Most Recent Urinalysis On File:     Component Value Date/Time   COLORURINE YELLOW (A) 02/12/2021 1658   APPEARANCEUR Clear 11/06/2023 1056   LABSPEC 1.017 02/12/2021 1658   PHURINE 5.0 02/12/2021 1658   GLUCOSEU Negative 11/06/2023 1056   HGBUR NEGATIVE 02/12/2021 1658   BILIRUBINUR Negative 11/06/2023 1056   KETONESUR NEGATIVE 02/12/2021 1658   PROTEINUR 3+ (A) 11/06/2023 1056   PROTEINUR 100 (A) 02/12/2021 1658   UROBILINOGEN 0.2 10/30/2020 1403   NITRITE Negative 11/06/2023 1056   NITRITE NEGATIVE 02/12/2021 1658   LEUKOCYTESUR Negative 11/06/2023 1056   LEUKOCYTESUR NEGATIVE 02/12/2021 1658   Sepsis  Labs: @LABRCNTIP (procalcitonin:4,lacticidven:4) Microbiology: No results found for this or any previous visit (from the past 240 hours).    Radiology Studies last 3 days: No results found.  Luna Salinas, MD [facility] 03/28/2024, 2:20 PM    Dictation software may have been used to generate the above note. Typos may occur and escape review in typed/dictated notes. Please contact Dr Authur Leghorn directly for clarity if needed.  Staff may message me via secure chat in Epic  but this may not receive an immediate response,  please page me for urgent matters!  If 7PM-7AM, please contact night coverage [email or web address]

## 2024-03-28 NOTE — Progress Notes (Signed)
Per Dr Nelson Chimes, dc [facility]/stroke orders

## 2024-03-29 ENCOUNTER — Telehealth: Payer: Self-pay

## 2024-03-29 DIAGNOSIS — I639 Cerebral infarction, unspecified: Secondary | ICD-10-CM | POA: Diagnosis not present

## 2024-03-29 MED ORDER — AMLODIPINE BESYLATE 5 MG PO TABS
5.0000 mg | ORAL_TABLET | Freq: Every day | ORAL | Status: DC
  Administered 2024-03-29 – 2024-03-31 (×3): 5 mg via ORAL
  Filled 2024-03-29 (×3): qty 1

## 2024-03-29 NOTE — Progress Notes (Signed)
 Physical Therapy Treatment Patient Details Name: Ronnie Bullock MRN: [id number] DOB: 1946/03/27 Today's Date: 03/29/2024   History of Present Illness Pt is a 78 y.o. male being admitted with RLE weakness resulting in 6 falls. MRI brain: Small focus of acute ischemia within the posterior left frontal lobe, within the ACA territory. 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    PT Comments  Patient alert, agreeable to PT, up in recliner at start/end of session. AFO donned with maxA, able to tie bilateral shoes with reminders to use RUE. He was able to transfer from recliner CGA, RW. Also from standard commode. Needed cues for hand placement each time to maximize safety. He ambulated ~157ft (including to the bathroom), and with fatigue increased RLE drag and decreased step length noted. Intermittent minA for unsteadiness, especially with turning.    If plan is discharge home, recommend the following: A little help with walking and/or transfers;A little help with bathing/dressing/bathroom   Can travel by private vehicle     Yes  Equipment Recommendations  Other (comment) (TBD)    Recommendations for Other Services       Precautions / Restrictions Precautions Precautions: Fall Recall of Precautions/Restrictions: Impaired Required Braces or Orthoses: Other Brace (AFO for RLE) Restrictions Weight Bearing Restrictions Per Provider Order: No     Mobility  Bed Mobility               General bed mobility comments: pt up in recliner at start/end of session    Transfers Overall transfer level: Needs assistance Equipment used: Rolling walker (2 wheels) Transfers: Sit to/from Stand Sit to Stand: Contact guard assist           General transfer comment: CGA from recliner and from standard commode. needed cues for hand placement each time    Ambulation/Gait Ambulation/Gait assistance: Contact guard assist, Min  assist Gait Distance (Feet): 160 Feet Assistive device: Rolling walker (2 wheels)         General Gait Details: with fatigue pt still required intermittent minA for RW safety and steadying.   Stairs             Wheelchair Mobility     Tilt Bed    Modified Rankin (Stroke Patients Only)       Balance Overall balance assessment: Needs assistance Sitting-balance support: No upper extremity supported, Feet supported Sitting balance-Leahy Scale: Fair     Standing balance support: Bilateral upper extremity supported, During functional activity, Reliant on assistive device for balance Standing balance-Leahy Scale: Poor                              Communication Communication Communication: Impaired Factors Affecting Communication: Difficulty expressing self;Reduced clarity of speech  Cognition Arousal: Alert Behavior During Therapy: WFL for tasks assessed/performed   PT - Cognitive impairments: History of cognitive impairments                         Following commands: Intact      Cueing Cueing Techniques: Verbal cues, Tactile cues, Gestural cues  Exercises      General Comments        Pertinent Vitals/Pain Pain Assessment Pain Assessment: No/denies pain    Home Living                          Prior Function  PT Goals (current goals can now be found in the care plan section) Progress towards PT goals: Progressing toward goals    Frequency    Min 3X/week      PT Plan      Co-evaluation              AM-PAC PT "6 Clicks" Mobility   Outcome Measure  Help needed turning from your back to your side while in a flat bed without using bedrails?: A Little Help needed moving from lying on your back to sitting on the side of a flat bed without using bedrails?: A Little Help needed moving to and from a bed to a chair (including a wheelchair)?: A Little Help needed standing up from a chair using your  arms (e.g., wheelchair or bedside chair)?: A Little Help needed to walk in hospital room?: A Lot Help needed climbing 3-5 steps with a railing? : A Lot 6 Click Score: 16    End of Session Equipment Utilized During Treatment: Gait belt Activity Tolerance: Patient tolerated treatment well Patient left: in chair;with call bell/phone within reach;with chair alarm set Nurse Communication: Mobility status PT Visit Diagnosis: Hemiplegia and hemiparesis Hemiplegia - Right/Left: Right Hemiplegia - dominant/non-dominant: Dominant Hemiplegia - caused by: Cerebral infarction     Time: 1610-9604 PT Time Calculation (min) (ACUTE ONLY): 16 min  Charges:    $Therapeutic Activity: 8-22 mins PT General Charges $$ ACUTE PT VISIT: 1 Visit                     Darien Eden PT, DPT 12:43 PM,03/29/24

## 2024-03-29 NOTE — Progress Notes (Signed)
 [facility]  - [place] [facility]   PATIENT NAME: Ronnie Bullock    MR#:  [id number]  DATE OF BIRTH:  02-Jan-1946  SUBJECTIVE:  patient sitting out in the chair. Alert and awake. It good breakfast. No family at bedside. Denies any complaints. No issues per RN.    VITALS:  Blood pressure (!) 150/93, pulse 88, temperature 99.1 F (37.3 C), temperature source Oral, resp. rate 18, height 5\' 6"  (1.676 m), weight 68 kg, SpO2 90%.  PHYSICAL EXAMINATION:   GENERAL:  78 y.o.-year-old patient with no acute distress.  LUNGS: Normal breath sounds bilaterally, no wheezing CARDIOVASCULAR: S1, S2 normal. No murmur   ABDOMEN: Soft, nontender, nondistended.  EXTREMITIES: No  edema b/l.    NEUROLOGIC: nonfocal  patient is alert and awake SKIN: No obvious rash, lesion, or ulcer.   Assessment and Plan   Ronnie Bullock is a 78 y.o. 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  He had no injury related to the falls. Patient goes to adult daycare who called EMS to take him to the ED after they noticed that he was unable to participate as he usually does as his right leg kept giving away. 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 However, Ddx for rare causes would include neurosyphilis, FMD, mycotic vessel changes .  PHASES score for the aneurysm is 2, corresponding to a 0.4%  5-year risk of aneurysm rupture.  --Permissive hypertension window has passed and BP has been at goal, continue monitor VS routinely --Statins w/ LDL goal less than 70 --Continue Eliquis . No ASA/Plavix. --RPR and HIV NR. Fungal infection seems improbable given atherosclerotic disease far more likely and no other clinical suspicion for fungemia, will not get fungal culture at this time  --Risk of aneurysm surgery outweighs benefits --> no surgical intervention  --In patients with an ischemic stroke or TIA attributed to FMD, secondary prevention measures include antiplatelet therapy, blood pressure control, and lifestyle modification which is all in progress, if other concerns for fibromuscular dysplasia would work this up outpatient    Essential hypertension, benign --held home antihypertensives (amlodipine ) for permissive hypertension and BP has been at goal, monitor VS and restart/adjust as needed    Chronic anticoagulation History of DVT LLE 2022 --Continue Eliquis , however low risk DVT and high risk falls will d/w daughter re: continuation of this   History of prostate cancer Urinary retention s/p radiation for prostate CA No acute issues suspected at this time --Tamsulosin  --Outpatient f/u   Dementia without behavioral disturbance  --Delirium precautions --Continue home Aricept    patient overall is at baseline. Awaiting to go to rehab.  Procedures: Family communication :none today Consults : neuro- CODE STATUS: full DVT Prophylaxis : this Level of care: Telemetry Medical Status is: Observation     TOTAL TIME TAKING CARE OF THIS PATIENT: 35 minutes.  >50% time spent on counselling and coordination of care  Note: This dictation was prepared with  Dragon dictation along with smaller Lobbyist. Any transcriptional errors that result from this process are unintentional.  Melvinia Stager M.D    [facility]   CC: Primary care physician; [name], DO

## 2024-03-29 NOTE — Plan of Care (Signed)
 Problem: Education: Goal: Knowledge of disease or condition will improve 03/29/2024 0454 by Aureliano Blow, RN Outcome: Progressing 03/29/2024 0319 by Aureliano Blow, RN Outcome: Progressing Goal: Knowledge of secondary prevention will improve (MUST DOCUMENT ALL) 03/29/2024 0454 by Aureliano Blow, RN Outcome: Progressing 03/29/2024 0319 by Aureliano Blow, RN Outcome: Progressing Goal: Knowledge of patient specific risk factors will improve (DELETE if not current risk factor) 03/29/2024 0454 by Aureliano Blow, RN Outcome: Progressing 03/29/2024 0319 by Aureliano Blow, RN Outcome: Progressing   Problem: Ischemic Stroke/TIA Tissue Perfusion: Goal: Complications of ischemic stroke/TIA will be minimized 03/29/2024 0454 by Aureliano Blow, RN Outcome: Progressing 03/29/2024 0319 by Aureliano Blow, RN Outcome: Progressing   Problem: Coping: Goal: Will verbalize positive feelings about self 03/29/2024 0454 by Aureliano Blow, RN Outcome: Progressing 03/29/2024 0319 by Aureliano Blow, RN Outcome: Progressing Goal: Will identify appropriate support needs 03/29/2024 0454 by Aureliano Blow, RN Outcome: Progressing 03/29/2024 0319 by Aureliano Blow, RN Outcome: Progressing   Problem: Health Behavior/Discharge Planning: Goal: Ability to manage health-related needs will improve 03/29/2024 0454 by Aureliano Blow, RN Outcome: Progressing 03/29/2024 0319 by Aureliano Blow, RN Outcome: Progressing Goal: Goals will be collaboratively established with patient/family 03/29/2024 0454 by Aureliano Blow, RN Outcome: Progressing 03/29/2024 0319 by Aureliano Blow, RN Outcome: Progressing   Problem: Self-Care: Goal: Ability to participate in self-care as condition permits will improve 03/29/2024 0454 by Aureliano Blow, RN Outcome: Progressing 03/29/2024 0319 by Aureliano Blow, RN Outcome: Progressing Goal: Verbalization of feelings and concerns over difficulty with self-care will improve 03/29/2024 0454 by Aureliano Blow, RN Outcome: Progressing 03/29/2024 0319 by Aureliano Blow,  RN Outcome: Progressing Goal: Ability to communicate needs accurately will improve 03/29/2024 0454 by Aureliano Blow, RN Outcome: Progressing 03/29/2024 0319 by Aureliano Blow, RN Outcome: Progressing   Problem: Nutrition: Goal: Risk of aspiration will decrease 03/29/2024 0454 by Aureliano Blow, RN Outcome: Progressing 03/29/2024 0319 by Aureliano Blow, RN Outcome: Progressing Goal: Dietary intake will improve 03/29/2024 0454 by Aureliano Blow, RN Outcome: Progressing 03/29/2024 0319 by Aureliano Blow, RN Outcome: Progressing   Problem: Education: Goal: Knowledge of General Education information will improve Description: Including pain rating scale, medication(s)/side effects and non-pharmacologic comfort measures 03/29/2024 0454 by Aureliano Blow, RN Outcome: Progressing 03/29/2024 0319 by Aureliano Blow, RN Outcome: Progressing   Problem: Health Behavior/Discharge Planning: Goal: Ability to manage health-related needs will improve 03/29/2024 0454 by Aureliano Blow, RN Outcome: Progressing 03/29/2024 0319 by Aureliano Blow, RN Outcome: Progressing   Problem: Clinical Measurements: Goal: Ability to maintain clinical measurements within normal limits will improve 03/29/2024 0454 by Aureliano Blow, RN Outcome: Progressing 03/29/2024 0319 by Aureliano Blow, RN Outcome: Progressing Goal: Will remain free from infection 03/29/2024 0454 by Aureliano Blow, RN Outcome: Progressing 03/29/2024 0319 by Aureliano Blow, RN Outcome: Progressing Goal: Diagnostic test results will improve 03/29/2024 0454 by Aureliano Blow, RN Outcome: Progressing 03/29/2024 0319 by Aureliano Blow, RN Outcome: Progressing Goal: Respiratory complications will improve 03/29/2024 0454 by Aureliano Blow, RN Outcome: Progressing 03/29/2024 0319 by Aureliano Blow, RN Outcome: Progressing Goal: Cardiovascular complication will be avoided 03/29/2024 0454 by Aureliano Blow, RN Outcome: Progressing 03/29/2024 0319 by Aureliano Blow, RN Outcome: Progressing   Problem: Activity: Goal: Risk for  activity intolerance will decrease 03/29/2024 0454 by Aureliano Blow, RN Outcome: Progressing 03/29/2024 0319 by Aureliano Blow, RN Outcome: Progressing   Problem: Nutrition: Goal: Adequate nutrition will be maintained 03/29/2024 0454 by Aureliano Blow, RN Outcome: Progressing 03/29/2024 0319 by Aureliano Blow, RN Outcome: Progressing   Problem: Coping: Goal: Level of anxiety will decrease  03/29/2024 0454 by Aureliano Blow, RN Outcome: Progressing 03/29/2024 0319 by Aureliano Blow, RN Outcome: Progressing   Problem: Elimination: Goal: Will not experience complications related to bowel motility 03/29/2024 0454 by Aureliano Blow, RN Outcome: Progressing 03/29/2024 0319 by Aureliano Blow, RN Outcome: Progressing Goal: Will not experience complications related to urinary retention 03/29/2024 0454 by Aureliano Blow, RN Outcome: Progressing 03/29/2024 0319 by Aureliano Blow, RN Outcome: Progressing   Problem: Pain Managment: Goal: General experience of comfort will improve and/or be controlled 03/29/2024 0454 by Aureliano Blow, RN Outcome: Progressing 03/29/2024 0319 by Aureliano Blow, RN Outcome: Progressing   Problem: Safety: Goal: Ability to remain free from injury will improve 03/29/2024 0454 by Aureliano Blow, RN Outcome: Progressing 03/29/2024 0319 by Aureliano Blow, RN Outcome: Progressing   Problem: Skin Integrity: Goal: Risk for impaired skin integrity will decrease 03/29/2024 0454 by Aureliano Blow, RN Outcome: Progressing 03/29/2024 0319 by Aureliano Blow, RN Outcome: Progressing

## 2024-03-29 NOTE — Progress Notes (Signed)
 Occupational Therapy Treatment Patient Details Name: Ronnie Bullock MRN: [id number] DOB: [birth year] Today's Date: 03/29/2024   History of present illness Pt is a 78 y.o. male being admitted with RLE weakness resulting in 6 falls. MRI brain: Small focus of acute ischemia within the posterior left frontal lobe, within the ACA territory. 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   OT comments  [name] seen for OT treatment on this date. Upon arrival to room pt seated in chair, agreeable to tx. Pt requires CGA + RW for toileting, MIN A for pericare for thoroughness, CGA for standing grooming at sink and MIN A for UB dressing. Pt requires CGA + RW for ambulation, MIN A for ambulation when fatigued due to R foot drag and repeated verbal cueing for management of RW. OT facilitated PROM PF/DF exercises on RLE x 15 -educated on importance of PROM to prevent contracture. Noted wound on R foot between 1st and 2nd toe - RN notified. Pt making good progress toward goals, will continue to follow POC. Discharge recommendation remains appropriate.        If plan is discharge home, recommend the following:  A little help with walking and/or transfers;A lot of help with bathing/dressing/bathroom;Direct supervision/assist for financial management;Supervision due to cognitive status;Help with stairs or ramp for entrance   Equipment Recommendations  Other (comment)    Recommendations for Other Services      Precautions / Restrictions Precautions Precautions: Fall Recall of Precautions/Restrictions: Impaired Required Braces or Orthoses: Other Brace Other Brace: AFO Restrictions Weight Bearing Restrictions Per Provider Order: No       Mobility Bed Mobility               General bed mobility comments: Not tested - pt received in chair    Transfers Overall transfer level: Needs assistance Equipment used: Rolling walker (2  wheels) Transfers: Sit to/from Stand Sit to Stand: Contact guard assist, Min assist           General transfer comment: CGA + RW for ambulation, MIN A + RW for ambulation when fatigued     Balance Overall balance assessment: Needs assistance Sitting-balance support: No upper extremity supported, Feet supported Sitting balance-Leahy Scale: Fair     Standing balance support: During functional activity, Reliant on assistive device for balance, Single extremity supported Standing balance-Leahy Scale: Fair                             ADL either performed or assessed with clinical judgement   ADL Overall ADL's : Needs assistance/impaired                                       General ADL Comments: CGA for toileting, MIN A for pericare for thoroughness, CGA  for standing grooming at sink; MIN A for UB dressing    Extremity/Trunk Assessment Upper Extremity Assessment Upper Extremity Assessment: Overall WFL for tasks assessed   Lower Extremity Assessment Lower Extremity Assessment: RLE deficits/detail;Generalized weakness RLE Deficits / Details: R hip and knee grossly 3-/5, R ankle PF/DF 0/5; generalized paresthesia mid-calf distally        Vision       Perception     Praxis     Communication Communication Communication: Impaired Factors Affecting Communication: Difficulty expressing self;Reduced clarity of speech  Cognition Arousal: Alert Behavior During Therapy: WFL for tasks assessed/performed Cognition: History of cognitive impairments, Cognition impaired       Memory impairment (select all impairments): Declarative long-term memory Attention impairment (select first level of impairment): Sustained attention Executive functioning impairment (select all impairments): Problem solving, Reasoning                   Following commands: Intact        Cueing   Cueing Techniques: Verbal cues, Tactile cues  Exercises Exercises:  General Lower Extremity General Exercises - Lower Extremity Toe Raises: PROM, Right, 15 reps, Seated Heel Raises: PROM, Right, 15 reps, Seated    Shoulder Instructions       General Comments Noted open wound b/w 1st and 2nd toe on R foot    Pertinent Vitals/ Pain       Pain Assessment Pain Assessment: No/denies pain  Home Living                                          Prior Functioning/Environment              Frequency  Min 3X/week        Progress Toward Goals  OT Goals(current goals can now be found in the care plan section)  Progress towards OT goals: Progressing toward goals  Acute Rehab OT Goals Patient Stated Goal: improve function OT Goal Formulation: With patient Time For Goal Achievement: 04/12/24 Potential to Achieve Goals: Good ADL Goals Pt Will Perform Lower Body Bathing: with supervision;sit to/from stand;sitting/lateral leans Pt Will Perform Lower Body Dressing: with supervision;sit to/from stand;sitting/lateral leans Pt Will Transfer to Toilet: with supervision;regular height toilet;ambulating Pt Will Perform Toileting - Clothing Manipulation and hygiene: with supervision;sit to/from stand;sitting/lateral leans  Plan      Co-evaluation                 AM-PAC OT "6 Clicks" Daily Activity     Outcome Measure   Help from another person eating meals?: None Help from another person taking care of personal grooming?: A Little Help from another person toileting, which includes using toliet, bedpan, or urinal?: A Little Help from another person bathing (including washing, rinsing, drying)?: A Lot Help from another person to put on and taking off regular upper body clothing?: A Little Help from another person to put on and taking off regular lower body clothing?: A Little 6 Click Score: 18    End of Session Equipment Utilized During Treatment: Gait belt;Rolling walker (2 wheels)  OT Visit Diagnosis: Other abnormalities  of gait and mobility (R26.89);Muscle weakness (generalized) (M62.81);Unsteadiness on feet (R26.81)   Activity Tolerance Patient tolerated treatment well   Patient Left in chair;with call bell/phone within reach;with chair alarm set   Nurse Communication  (Open wound on R foot/toes)        Time: 1610-9604 OT Time Calculation (min): 21 min  Charges: OT General Charges $OT Visit: 1 Visit OT Treatments $Self Care/Home Management : 8-22 mins  Stevenson Elbe, Student OT   [employer] 03/29/2024, 2:46 PM

## 2024-03-29 NOTE — Plan of Care (Signed)

## 2024-03-29 NOTE — Plan of Care (Signed)
  Problem: Education: Goal: Knowledge of disease or condition will improve Outcome: Progressing   Problem: Nutrition: Goal: Dietary intake will improve Outcome: Progressing   Problem: Education: Goal: Knowledge of General Education information will improve Description: Including pain rating scale, medication(s)/side effects and non-pharmacologic comfort measures Outcome: Progressing   Problem: Clinical Measurements: Goal: Respiratory complications will improve Outcome: Progressing

## 2024-03-29 NOTE — Telephone Encounter (Signed)
 FYI to provider.   Copied from CRM [id number]. Topic: General - Other >> Mar 28, 2024 10:33 AM Oddis Bench wrote: Reason for CRM: Dawn patient sister is calling to let Dr. Lincoln Renshaw that he is in the hospital he had a stroke last week. Patient is going to [facility] for short term rehab.

## 2024-03-30 DIAGNOSIS — I639 Cerebral infarction, unspecified: Secondary | ICD-10-CM | POA: Diagnosis not present

## 2024-03-30 MED ORDER — MAGNESIUM SULFATE (LAXATIVE) PO GRAN
5.0000 g | GRANULES | Freq: Two times a day (BID) | ORAL | Status: DC
  Administered 2024-03-30 – 2024-03-31 (×3): 5 g via ORAL
  Filled 2024-03-30: qty 5

## 2024-03-30 MED ORDER — MUPIROCIN CALCIUM 2 % EX CREA
TOPICAL_CREAM | Freq: Two times a day (BID) | CUTANEOUS | Status: DC
  Filled 2024-03-30: qty 15

## 2024-03-30 NOTE — Progress Notes (Signed)
 Physical Therapy Treatment Patient Details Name: Ronnie Bullock MRN: [id number] DOB: August 28, 1946 Today's Date: 03/30/2024   History of Present Illness Pt is a 78 y.o. male being admitted with RLE weakness resulting in 6 falls. MRI brain: Small focus of acute ischemia within the posterior left frontal lobe, within the ACA territory. 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    PT Comments  Overall, pt demonstrated much improved activity tolerance and safety with mobility, transfers, and gait at supervision level with RW. During gait, as pt increased gait speed, PTA paused pt intermittently to increase pt's awareness of R LE swing and how pt needs to allow R LE increased time for full swing before stepping with LLE; pt able to follow through with instruction.  Continued PT will assist pt towards greater dynamic standing balance, RLE strengthening, and activity tolerance to increase safety and independence and decrease burden of care with functional mobility.    If plan is discharge home, recommend the following: A little help with walking and/or transfers;A little help with bathing/dressing/bathroom   Can travel by private vehicle     Yes  Equipment Recommendations  Other (comment) (TBD)    Recommendations for Other Services       Precautions / Restrictions Precautions Precautions: Fall Recall of Precautions/Restrictions: Impaired Required Braces or Orthoses: Other Brace Other Brace: AFO Restrictions Weight Bearing Restrictions Per Provider Order: No     Mobility  Bed Mobility               General bed mobility comments: Not tested - pt received in chair    Transfers Overall transfer level: Needs assistance Equipment used: Rolling walker (2 wheels) Transfers: Sit to/from Stand, Bed to chair/wheelchair/BSC Sit to Stand: Supervision   Step pivot transfers: Supervision             Ambulation/Gait Ambulation/Gait assistance: Supervision Gait Distance (Feet): 160 Feet Assistive device: Rolling walker (2 wheels) Gait Pattern/deviations: Step-through pattern, Trunk flexed, Staggering left, Decreased stride length, Decreased step length - right Gait velocity: decreased     General Gait Details: as pt increased gait speed, paused pt intermittently to increase pt's awareness of R LE swing and how pt needs to allow R LE increased time for full swing before stepping with LLE; pt able to follow through with instruction.   Stairs             Wheelchair Mobility     Tilt Bed    Modified Rankin (Stroke Patients Only)       Balance Overall balance assessment: Needs assistance Sitting-balance support: No upper extremity supported, Feet supported Sitting balance-Leahy Scale: Good Sitting balance - Comments: Pt able to bend over and tie shoes.   Standing balance support: During functional activity, Reliant on assistive device for balance, Single extremity supported Standing balance-Leahy Scale: Good Standing balance comment: practised dynamic standing balance with intermittent UE support  with upper trunk rotations and head turns at close supervision level.                            Communication Communication Communication: Impaired Factors Affecting Communication: Difficulty expressing self;Reduced clarity of speech  Cognition Arousal: Alert     PT - Cognitive impairments: History of cognitive impairments                       PT - Cognition Comments: Does follow simple  verbal commands, pleasant and cooperative Following commands: Intact      Cueing Cueing Techniques: Verbal cues, Tactile cues  Exercises Total Joint Exercises Marching in Standing: AROM, Strengthening, Right, Left, 5 reps    General Comments        Pertinent Vitals/Pain Pain Assessment Pain Assessment: No/denies pain    Home Living                           Prior Function            PT Goals (current goals can now be found in the care plan section) Acute Rehab PT Goals Patient Stated Goal: none stated however pt is agreeable to rehab at DC Time For Goal Achievement: 04/05/24 Potential to Achieve Goals: Good Progress towards PT goals: Progressing toward goals    Frequency    Min 3X/week      PT Plan      Co-evaluation              AM-PAC PT "6 Clicks" Mobility   Outcome Measure  Help needed turning from your back to your side while in a flat bed without using bedrails?: A Little Help needed moving from lying on your back to sitting on the side of a flat bed without using bedrails?: A Little Help needed moving to and from a bed to a chair (including a wheelchair)?: A Little Help needed standing up from a chair using your arms (e.g., wheelchair or bedside chair)?: A Little Help needed to walk in hospital room?: A Little Help needed climbing 3-5 steps with a railing? : A Lot 6 Click Score: 17    End of Session Equipment Utilized During Treatment: Gait belt Activity Tolerance: Patient tolerated treatment well Patient left: in chair;with call bell/phone within reach;with chair alarm set Nurse Communication: Mobility status PT Visit Diagnosis: Hemiplegia and hemiparesis Hemiplegia - Right/Left: Right Hemiplegia - dominant/non-dominant: Dominant Hemiplegia - caused by: Cerebral infarction     Time: 1024-1039 PT Time Calculation (min) (ACUTE ONLY): 15 min  Charges:    $Therapeutic Activity: 8-22 mins PT General Charges $$ ACUTE PT VISIT: 1 Visit                     Eliazar Gross, PTA  03/30/24, 10:53 AM

## 2024-03-30 NOTE — TOC Progression Note (Signed)
 Transition of Care [facility]) - Progression Note    Patient Details  Name: Ronnie Bullock MRN: [id number] Date of Birth: [birth year]  [facility]) CM/SW Contact  Alexandra Ice, RN Phone Number: 03/30/2024, 7:51 AM  Clinical Narrative:     Sent message to [facility], inquiring if they have bed for patient today, awaiting response.   Expected Discharge Plan: Skilled Nursing Facility Barriers to Discharge: Continued Medical Work up  Expected Discharge Plan and Services       Living arrangements for the past 2 months: Single Family Home                                       Social Determinants of Health (SDOH) Interventions SDOH Screenings   Food Insecurity: No Food Insecurity (03/22/2024)  Housing: Unknown (03/22/2024)  Transportation Needs: No Transportation Needs (03/22/2024)  Utilities: Not At Risk (03/22/2024)  Alcohol Screen: Low Risk  (08/02/2022)  Depression (PHQ2-9): Low Risk  (12/13/2023)  Financial Resource Strain: Low Risk  (09/05/2023)  Physical Activity: Inactive (08/02/2022)  Social Connections: Moderately Integrated (03/22/2024)  Stress: No Stress Concern Present (08/02/2022)  Tobacco Use: Low Risk  (03/21/2024)  Health Literacy: Adequate Health Literacy (06/28/2023)    Readmission Risk Interventions     No data to display

## 2024-03-30 NOTE — Progress Notes (Signed)
 [facility]  - [place] [facility]   PATIENT NAME: Ronnie Bullock    MR#:  [id number]  DATE OF BIRTH:  07/09/1946  SUBJECTIVE:  patient sitting out in the chair. Alert and awake. No family at bedside. Denies any complaints.  RN mentioned about right  toe/nail infection  VITALS:  Blood pressure 106/73, pulse 83, temperature (!) 97.5 F (36.4 C), temperature source Oral, resp. rate 17, height 5\' 6"  (1.676 m), weight 68 kg, SpO2 97%.  PHYSICAL EXAMINATION:   GENERAL:  78 y.o.-year-old patient with no acute distress.  LUNGS: Normal breath sounds bilaterally, no wheezing CARDIOVASCULAR: S1, S2 normal. No murmur   ABDOMEN: Soft, nontender, nondistended.  EXTREMITIES: No  edema b/l.    Right toe   NEUROLOGIC: nonfocal  patient is alert and awake   Assessment and Plan   Ronnie Bullock is a 78 y.o. 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  He had no injury related to the falls. Patient goes to adult daycare who called EMS to take him to the ED after they noticed that he was unable to participate as he usually does as his right leg kept giving away. 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 --Permissive hypertension window has passed and BP has been at goal, continue monitor VS routinely --Statins w/ LDL goal less than 70 --Continue Eliquis . No  ASA/Plavix. --RPR and HIV NR. Fungal infection seems improbable given atherosclerotic disease far more likely and no other clinical suspicion for fungemia, will not get fungal culture at this time  --Risk of aneurysm surgery outweighs benefits --> no surgical intervention     Essential hypertension, benign --held home antihypertensives (amlodipine ) for permissive hypertension and BP has been at goal --on low dose amlodipine --pt tolerating it   Chronic anticoagulation History of DVT LLE 2022 --Continue Eliquis , however low risk DVT and high risk falls will d/w daughter re: continuation of this   History of prostate cancer 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 Awaiting to go to rehab.  Procedures: Family communication :none today Consults : neuro CODE STATUS: full DVT Prophylaxis : this Level of care: Telemetry Medical Status is: Observation     TOTAL TIME TAKING CARE OF THIS PATIENT: 35 minutes.  >50% time spent on counselling and coordination of care  Note: This dictation was prepared with Dragon dictation along with smaller phrase technology. Any transcriptional errors that result from this process are unintentional.  Ronnie Bullock M.D    [facility]   CC: Primary care physician; [name], DO

## 2024-03-30 NOTE — Plan of Care (Signed)
 Problem: Education: Goal: Knowledge of disease or condition will improve 03/30/2024 0510 by Aureliano Blow, RN Outcome: Progressing 03/30/2024 0312 by Aureliano Blow, RN Outcome: Progressing Goal: Knowledge of secondary prevention will improve (MUST DOCUMENT ALL) 03/30/2024 0510 by Aureliano Blow, RN Outcome: Progressing 03/30/2024 0312 by Aureliano Blow, RN Outcome: Progressing Goal: Knowledge of patient specific risk factors will improve (DELETE if not current risk factor) 03/30/2024 0510 by Aureliano Blow, RN Outcome: Progressing 03/30/2024 0312 by Aureliano Blow, RN Outcome: Progressing   Problem: Ischemic Stroke/TIA Tissue Perfusion: Goal: Complications of ischemic stroke/TIA will be minimized 03/30/2024 0510 by Aureliano Blow, RN Outcome: Progressing 03/30/2024 0312 by Aureliano Blow, RN Outcome: Progressing   Problem: Coping: Goal: Will verbalize positive feelings about self 03/30/2024 0510 by Aureliano Blow, RN Outcome: Progressing 03/30/2024 0312 by Aureliano Blow, RN Outcome: Progressing Goal: Will identify appropriate support needs 03/30/2024 0510 by Aureliano Blow, RN Outcome: Progressing 03/30/2024 0312 by Aureliano Blow, RN Outcome: Progressing   Problem: Health Behavior/Discharge Planning: Goal: Ability to manage health-related needs will improve 03/30/2024 0510 by Aureliano Blow, RN Outcome: Progressing 03/30/2024 0312 by Aureliano Blow, RN Outcome: Progressing Goal: Goals will be collaboratively established with patient/family 03/30/2024 0510 by Aureliano Blow, RN Outcome: Progressing 03/30/2024 0312 by Aureliano Blow, RN Outcome: Progressing   Problem: Self-Care: Goal: Ability to participate in self-care as condition permits will improve 03/30/2024 0510 by Aureliano Blow, RN Outcome: Progressing 03/30/2024 0312 by Aureliano Blow, RN Outcome: Progressing Goal: Verbalization of feelings and concerns over difficulty with self-care will improve 03/30/2024 0510 by Aureliano Blow, RN Outcome: Progressing 03/30/2024 0312 by Aureliano Blow,  RN Outcome: Progressing Goal: Ability to communicate needs accurately will improve 03/30/2024 0510 by Aureliano Blow, RN Outcome: Progressing 03/30/2024 0312 by Aureliano Blow, RN Outcome: Progressing   Problem: Nutrition: Goal: Risk of aspiration will decrease 03/30/2024 0510 by Aureliano Blow, RN Outcome: Progressing 03/30/2024 0312 by Aureliano Blow, RN Outcome: Progressing Goal: Dietary intake will improve 03/30/2024 0510 by Aureliano Blow, RN Outcome: Progressing 03/30/2024 0312 by Aureliano Blow, RN Outcome: Progressing   Problem: Education: Goal: Knowledge of General Education information will improve Description: Including pain rating scale, medication(s)/side effects and non-pharmacologic comfort measures 03/30/2024 0510 by Aureliano Blow, RN Outcome: Progressing 03/30/2024 0312 by Aureliano Blow, RN Outcome: Progressing   Problem: Health Behavior/Discharge Planning: Goal: Ability to manage health-related needs will improve 03/30/2024 0510 by Aureliano Blow, RN Outcome: Progressing 03/30/2024 0312 by Aureliano Blow, RN Outcome: Progressing   Problem: Clinical Measurements: Goal: Ability to maintain clinical measurements within normal limits will improve 03/30/2024 0510 by Aureliano Blow, RN Outcome: Progressing 03/30/2024 0312 by Aureliano Blow, RN Outcome: Progressing Goal: Will remain free from infection 03/30/2024 0510 by Aureliano Blow, RN Outcome: Progressing 03/30/2024 0312 by Aureliano Blow, RN Outcome: Progressing Goal: Diagnostic test results will improve 03/30/2024 0510 by Aureliano Blow, RN Outcome: Progressing 03/30/2024 0312 by Aureliano Blow, RN Outcome: Progressing Goal: Respiratory complications will improve 03/30/2024 0510 by Aureliano Blow, RN Outcome: Progressing 03/30/2024 0312 by Aureliano Blow, RN Outcome: Progressing Goal: Cardiovascular complication will be avoided 03/30/2024 0510 by Aureliano Blow, RN Outcome: Progressing 03/30/2024 0312 by Aureliano Blow, RN Outcome: Progressing   Problem: Activity: Goal: Risk for  activity intolerance will decrease 03/30/2024 0510 by Aureliano Blow, RN Outcome: Progressing 03/30/2024 0312 by Aureliano Blow, RN Outcome: Progressing   Problem: Nutrition: Goal: Adequate nutrition will be maintained 03/30/2024 0510 by Aureliano Blow, RN Outcome: Progressing 03/30/2024 0312 by Aureliano Blow, RN Outcome: Progressing   Problem: Coping: Goal: Level of anxiety will decrease  03/30/2024 0510 by Aureliano Blow, RN Outcome: Progressing 03/30/2024 0312 by Aureliano Blow, RN Outcome: Progressing   Problem: Elimination: Goal: Will not experience complications related to bowel motility 03/30/2024 0510 by Aureliano Blow, RN Outcome: Progressing 03/30/2024 0312 by Aureliano Blow, RN Outcome: Progressing Goal: Will not experience complications related to urinary retention 03/30/2024 0510 by Aureliano Blow, RN Outcome: Progressing 03/30/2024 0312 by Aureliano Blow, RN Outcome: Progressing   Problem: Pain Managment: Goal: General experience of comfort will improve and/or be controlled 03/30/2024 0510 by Aureliano Blow, RN Outcome: Progressing 03/30/2024 0312 by Aureliano Blow, RN Outcome: Progressing   Problem: Safety: Goal: Ability to remain free from injury will improve 03/30/2024 0510 by Aureliano Blow, RN Outcome: Progressing 03/30/2024 0312 by Aureliano Blow, RN Outcome: Progressing   Problem: Skin Integrity: Goal: Risk for impaired skin integrity will decrease 03/30/2024 0510 by Aureliano Blow, RN Outcome: Progressing 03/30/2024 0312 by Aureliano Blow, RN Outcome: Progressing

## 2024-03-30 NOTE — Plan of Care (Signed)
  Problem: Nutrition: Goal: Risk of aspiration will decrease Outcome: Progressing Goal: Dietary intake will improve Outcome: Progressing   Problem: Clinical Measurements: Goal: Respiratory complications will improve Outcome: Progressing

## 2024-03-30 NOTE — Progress Notes (Signed)
 Mobility Specialist - Progress Note    03/30/24 0935  Mobility  Activity Ambulated with assistance in hallway  Level of Assistance Standby assist, set-up cues, supervision of patient - no hands on  Assistive Device Front wheel walker  Distance Ambulated (ft) 100 ft  Range of Motion/Exercises Active  Activity Response Tolerated well  Mobility Referral Yes  Mobility visit 1 Mobility  Mobility Specialist Start Time (ACUTE ONLY) [id number]  Mobility Specialist Stop Time (ACUTE ONLY) [id number]  Mobility Specialist Time Calculation (min) (ACUTE ONLY) 21 min   Pt resting in bed on RA upon entry. Pt dons boots + AFO with assistance at bedside. Pt STS and ambulates to hallway around SBA with RW. Pt takes x1 sitting rest break before returning to recliner. Pt left in chair with needs in reach and chair alarm. Pt eating breakfast indep.   [name] Mobility Specialist 03/30/24, 9:53 AM

## 2024-03-30 NOTE — Plan of Care (Signed)

## 2024-03-31 MED ORDER — MUPIROCIN CALCIUM 2 % EX CREA: TOPICAL_CREAM | Freq: Two times a day (BID) | CUTANEOUS | 0 refills | Status: AC

## 2024-03-31 NOTE — TOC Transition Note (Signed)
 Transition of Care [facility]) - Discharge Note   Patient Details  Name: Ronnie Bullock MRN: [id number] Date of Birth: 09/05/1946  [facility]) CM/SW Contact:  Alexandra Ice, RN Phone Number: 03/31/2024, 10:51 AM   Clinical Narrative:     Received message from [place] at [facility], they are able to accept today. Notified MD and bedside nurse. Discharge summary and orders sent to facility via HUB. Spoke with Will at [facility] patient is #5 on schedule for today. EMS packet printed to nurse station. Patient going to room 48A and nurse to call report to [phone]. Notified bedside nurse and MD.   Final next level of care: Long Term Nursing Home Barriers to Discharge: Barriers Resolved   Patient Goals and CMS Choice Patient states their goals for this hospitalization and ongoing recovery are:: N/A unable to answer, patient has dementia          Discharge Placement                Patient to be transferred to facility by: [facility] Name of family member notified: Family Patient and family notified of of transfer: 03/31/24  Discharge Plan and Services Additional resources added to the After Visit Summary for                    DME Agency: NA       HH Arranged: NA          Social Drivers of Health (SDOH) Interventions SDOH Screenings   Food Insecurity: No Food Insecurity (03/22/2024)  Housing: Unknown (03/22/2024)  Transportation Needs: No Transportation Needs (03/22/2024)  Utilities: Not At Risk (03/22/2024)  Alcohol Screen: Low Risk  (08/02/2022)  Depression (PHQ2-9): Low Risk  (12/13/2023)  Financial Resource Strain: Low Risk  (09/05/2023)  Physical Activity: Inactive (08/02/2022)  Social Connections: Moderately Integrated (03/22/2024)  Stress: No Stress Concern Present (08/02/2022)  Tobacco Use: Low Risk  (03/21/2024)  Health Literacy: Adequate Health Literacy (06/28/2023)     Readmission Risk Interventions     No data to display

## 2024-03-31 NOTE — Plan of Care (Signed)

## 2024-03-31 NOTE — TOC Progression Note (Signed)
 Transition of Care [facility]) - Progression Note    Patient Details  Name: Ronnie Bullock MRN: [id number] Date of Birth: 03/16/1946  Transition of Care [facility]) CM/SW Contact  Alexandra Ice, RN Phone Number: 03/31/2024, 8:11 AM  Clinical Narrative:    Sent message to [facility] with [facility], inquiring if he is able to accept patient today. Awaiting response.    Expected Discharge Plan: Skilled Nursing Facility Barriers to Discharge: Continued Medical Work up  Expected Discharge Plan and Services       Living arrangements for the past 2 months: Single Family Home                                       Social Determinants of Health (SDOH) Interventions SDOH Screenings   Food Insecurity: No Food Insecurity (03/22/2024)  Housing: Unknown (03/22/2024)  Transportation Needs: No Transportation Needs (03/22/2024)  Utilities: Not At Risk (03/22/2024)  Alcohol Screen: Low Risk  (08/02/2022)  Depression (PHQ2-9): Low Risk  (12/13/2023)  Financial Resource Strain: Low Risk  (09/05/2023)  Physical Activity: Inactive (08/02/2022)  Social Connections: Moderately Integrated (03/22/2024)  Stress: No Stress Concern Present (08/02/2022)  Tobacco Use: Low Risk  (03/21/2024)  Health Literacy: Adequate Health Literacy (06/28/2023)    Readmission Risk Interventions     No data to display

## 2024-03-31 NOTE — Discharge Summary (Signed)
 Physician Discharge Summary   Patient: Ronnie Bullock MRN: [id number] DOB: [birth year]  Admit date:     03/21/2024  Discharge date: 03/31/24  Discharge Physician: Ronnie Bullock   PCP: Ronnie Dupre, DO   Recommendations at discharge:    F/u PCP in 1-2 weeks Right Toe dressing per instruction  Discharge Diagnoses: Principal Problem:   Acute CVA (cerebrovascular accident) [facility]) Active Problems:   Essential hypertension, benign   History of DVT (deep vein thrombosis)   Chronic anticoagulation   Dementia without behavioral disturbance (HCC)   History of prostate cancer   Right sided numbness   Right sided weakness   Ronnie Bullock is a 78 y.o. 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  He had no injury related to the falls. Patient goes to adult daycare who called EMS to take him to the ED after they noticed that he was unable to participate as he usually does as his right leg kept giving away. 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 --Permissive hypertension window has passed and BP has been at goal, continue monitor VS routinely --Statins w/ LDL goal less than 70 --Continue Eliquis . No ASA/Plavix. --RPR and HIV NR. 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 D/c to [facility]   Procedures: Family communication :none today Consults : neuro CODE STATUS: full       Pain control - [place]  Controlled Substance Reporting System database was reviewed. and patient was instructed, not to drive, operate heavy machinery, perform activities at heights, swimming or participation in water activities or provide baby-sitting services while on Pain, Sleep and Anxiety Medications; until their outpatient Physician has advised to do so again. Also recommended to not to take more than prescribed Pain, Sleep and Anxiety Medications.  Disposition: Skilled nursing facility Diet recommendation:  Discharge Diet Orders (From admission, onward)     Start     Ordered   03/31/24 0000  Diet - low sodium heart healthy        03/31/24 1029           Cardiac diet DISCHARGE MEDICATION: Allergies as of 03/31/2024   No Known Allergies      Medication List     TAKE these medications    amLODipine  5 MG tablet Commonly known as: NORVASC  TAKE 1 TABLET BY MOUTH ONCE EVERY MORNING  apixaban  5 MG Tabs tablet Commonly known as: Eliquis  Take 1 tablet (5 mg total) by mouth 2 (two) times daily.   cetirizine  10 MG tablet Commonly known as: ZYRTEC  Take 1 tablet (10 mg total) by mouth daily.   donepezil  5 MG tablet Commonly known as: ARICEPT  TAKE 1 TABLET BY MOUTH AT BEDTIME    multivitamin with minerals Tabs tablet Take 1 tablet by mouth daily.   mupirocin cream 2 % Commonly known as: BACTROBAN Apply topically 2 (two) times daily.   tamsulosin  0.4 MG Caps capsule Commonly known as: FLOMAX  Take 2 capsules (0.8 mg total) by mouth daily. What changed: Another medication with the same name was removed. Continue taking this medication, and follow the directions you see here.   triamcinolone  cream 0.1 % Commonly known as: KENALOG  Apply 1 Application topically 2 (two) times daily. What changed:  when to take this reasons to take this               Discharge Care Instructions  (From admission, onward)           Start     Ordered   03/31/24 0000  Discharge wound care:       Comments: 03/30/24 1144    Wound care  Every shift      Comments: Right great toe. Apply mupirocin. Apply guaze soaked in Epsom Salt. Wrap in Kerlix. Two times a day.  03/30/24 1151   03/31/24 1029            Contact information for follow-up providers     Bullock, Ronnie P, DO Follow up.   Specialty: Family Medicine Why: Hospital follow up Contact information: [address] [place] [address] [phone]         Ronnie Bullock, DPM. Schedule an appointment as soon as possible for a visit in 2 week(s).   Specialty: Podiatry Why: for 2 toenail infection Contact information: [address] [place] [address] [phone]              Contact information for after-discharge care     Destination     [facility] .   Service: Skilled Nursing Contact information: [address] [place]  [phone]                    Discharge Exam: Ronnie Bullock Weights   03/21/24 1647 03/21/24 1649  Weight: 68 kg 68 kg   GENERAL:  78 y.o.-year-old patient with no acute distress.  LUNGS: Normal breath sounds bilaterally, no wheezing CARDIOVASCULAR: S1, S2 normal. No murmur   ABDOMEN:  Soft, nontender, nondistended.  EXTREMITIES:  Right toe   NEUROLOGIC: nonfocal  patient is alert and awake  Condition at discharge: fair  The results of significant diagnostics from this hospitalization (including imaging, microbiology, ancillary and laboratory) are listed below for reference.   Imaging Studies: ECHOCARDIOGRAM COMPLETE Result Date: 03/23/2024    ECHOCARDIOGRAM REPORT   Patient Name:   ZAYVEN POWE Date of Exam: 03/22/2024 Medical Rec #:  [id number]        Height:       66.0 in Accession #:    [id number]       Weight:       150.0 lb Date of Birth:  11-18-1945        BSA:          1.770 m Patient Age:    53 years  BP:           137/105 mmHg Patient Gender: M                HR:           88 bpm. Exam Location:  [facility] Procedure: 2D Echo, Cardiac Doppler and Color Doppler (Both Spectral and Color            Flow Doppler were utilized during procedure). Indications:     Stroke I63.9  History:         Patient has prior history of Echocardiogram examinations, most                  recent 02/14/2021. CAD, Stroke; Risk Factors:Hypertension.  Sonographer:     Terrilee Few RCS Referring Phys:  [id number] Lanetta Pion Diagnosing Phys: Jackquelyn Mass MD IMPRESSIONS  1. Left ventricular ejection fraction, by estimation, is 65 to 70%. The left ventricle has normal function. The left ventricle has no regional wall motion abnormalities. Left ventricular diastolic parameters are consistent with Grade I diastolic dysfunction (impaired relaxation).  2. Right ventricular systolic function is normal. The right ventricular size is normal. There is normal pulmonary artery systolic pressure. The estimated right ventricular systolic pressure is 23.6 mmHg.  3. The mitral valve is grossly normal. Trivial mitral valve regurgitation. No evidence of mitral stenosis.  4. The aortic valve is tricuspid. Aortic valve regurgitation is not visualized. No aortic stenosis is present.  5. The inferior vena cava is  normal in size with greater than 50% respiratory variability, suggesting right atrial pressure of 3 mmHg. Comparison(s): No significant change from prior study. Conclusion(s)/Recommendation(s): No intracardiac source of embolism detected on this transthoracic study. Consider a transesophageal echocardiogram to exclude cardiac source of embolism if clinically indicated. FINDINGS  Left Ventricle: Left ventricular ejection fraction, by estimation, is 65 to 70%. The left ventricle has normal function. The left ventricle has no regional wall motion abnormalities. The left ventricular internal cavity size was normal in size. There is  no left ventricular hypertrophy. Left ventricular diastolic parameters are consistent with Grade I diastolic dysfunction (impaired relaxation). Right Ventricle: The right ventricular size is normal. No increase in right ventricular wall thickness. Right ventricular systolic function is normal. There is normal pulmonary artery systolic pressure. The tricuspid regurgitant velocity is 2.27 m/s, and  with an assumed right atrial pressure of 3 mmHg, the estimated right ventricular systolic pressure is 23.6 mmHg. Left Atrium: Left atrial size was normal in size. Right Atrium: Right atrial size was normal in size. Pericardium: There is no evidence of pericardial effusion. Mitral Valve: The mitral valve is grossly normal. Trivial mitral valve regurgitation. No evidence of mitral valve stenosis. Tricuspid Valve: The tricuspid valve is grossly normal. Tricuspid valve regurgitation is trivial. No evidence of tricuspid stenosis. Aortic Valve: The aortic valve is tricuspid. Aortic valve regurgitation is not visualized. No aortic stenosis is present. Aortic valve peak gradient measures 5.9 mmHg. Pulmonic Valve: The pulmonic valve was grossly normal. Pulmonic valve regurgitation is mild. No evidence of pulmonic stenosis. Aorta: The aortic root and ascending aorta are structurally normal, with no evidence  of dilitation. Venous: The inferior vena cava is normal in size with greater than 50% respiratory variability, suggesting right atrial pressure of 3 mmHg. IAS/Shunts: The atrial septum is grossly normal.  LEFT VENTRICLE PLAX 2D LVIDd:         3.20 cm   Diastology LVIDs:         1.90  cm   LV e' medial:    7.18 cm/s LV PW:         1.10 cm   LV E/e' medial:  8.5 LV IVS:        0.90 cm   LV e' lateral:   11.90 cm/s LVOT diam:     2.30 cm   LV E/e' lateral: 5.1 LV SV:         56 LV SV Index:   32 LVOT Area:     4.15 cm  RIGHT VENTRICLE             IVC RV S prime:     10.30 cm/s  IVC diam: 1.50 cm TAPSE (M-mode): 2.5 cm LEFT ATRIUM             Index        RIGHT ATRIUM           Index LA diam:        2.40 cm 1.36 cm/m   RA Area:     17.40 cm LA Vol (A2C):   23.3 ml 13.17 ml/m  RA Volume:   53.10 ml  30.01 ml/m LA Vol (A4C):   28.3 ml 15.99 ml/m LA Biplane Vol: 25.6 ml 14.47 ml/m  AORTIC VALVE                 PULMONIC VALVE AV Area (Vmax): 3.01 cm     PR End Diast Vel: 7.29 msec AV Vmax:        121.00 cm/s AV Peak Grad:   5.9 mmHg LVOT Vmax:      87.53 cm/s LVOT Vmean:     55.033 cm/s LVOT VTI:       0.135 m  AORTA Ao Root diam: 3.20 cm Ao Asc diam:  3.60 cm MITRAL VALVE               TRICUSPID VALVE MV Area (PHT): 4.06 cm    TR Peak grad:   20.6 mmHg MV Decel Time: 187 msec    TR Vmax:        227.00 cm/s MV E velocity: 60.80 cm/s MV A velocity: 94.30 cm/s  SHUNTS MV E/A ratio:  0.64        Systemic VTI:  0.13 m                            Systemic Diam: 2.30 cm Jackquelyn Mass MD Electronically signed by Jackquelyn Mass MD Signature Date/Time: 03/23/2024/2:31:09 PM    Final    CT ANGIO HEAD NECK W WO CM Result Date: 03/23/2024 CLINICAL DATA:  78 year old male with right side weakness. Small left superior motor cortex infarcts, possibly distal left ACA territory. EXAM: CT ANGIOGRAPHY HEAD AND NECK WITH AND WITHOUT CONTRAST TECHNIQUE: Multidetector CT imaging of the head and neck was performed using the standard  protocol during bolus administration of intravenous contrast. Multiplanar CT image reconstructions and MIPs were obtained to evaluate the vascular anatomy. Carotid stenosis measurements (when applicable) are obtained utilizing NASCET criteria, using the distal internal carotid diameter as the denominator. RADIATION DOSE REDUCTION: This exam was performed according to the departmental dose-optimization program which includes automated exposure control, adjustment of the mA and/or kV according to patient size and/or use of iterative reconstruction technique. CONTRAST:  75mL OMNIPAQUE  IOHEXOL  350 MG/ML SOLN COMPARISON:  Brain MRI and Head CT 03/21/2024. FINDINGS: CT HEAD Brain: Left superior motor strip infarct is subtle by CT (series  5, image 24). Other chronically advanced left greater than right cerebral white matter hypodensity, left frontal lobe encephalomalacia with ex vacuo enlargement is stable. Stable chronic left cerebellar linear infarct. No acute intracranial hemorrhage identified. No midline shift, mass effect, or evidence of intracranial mass lesion. No new cortically based infarct identified. Calvarium and skull base: Intact. No acute osseous abnormality identified. Paranasal sinuses: Paranasal sinus disease is stable. Tympanic cavities and mastoids remain well aerated. Orbits: No acute orbit or scalp soft tissue finding. CTA NECK Skeleton: Absent dentition. Cervical spine degeneration. No acute osseous abnormality identified. Upper chest: Dependent atelectasis and/or trace pleural effusions. Negative visible superior mediastinum. Other neck: Retained secretions in the hypopharynx, subglottic larynx and trachea at the thoracic inlet. Other nonvascular neck soft tissue spaces appear negative. Aortic arch: Tortuous aortic arch. Four vessel arch with separate origin of the left vertebral artery. Right carotid system: Tortuous brachiocephalic artery without plaque or stenosis. Tortuous right CCA. Minimal  plaque at the right carotid bifurcation. No stenosis. Left carotid system: Similar tortuosity with no significant plaque or stenosis. A portion of the proximal left CCA is obscured from dense left subclavian venous contrast streak artifact. Vertebral arteries: Mildly tortuous proximal right subclavian artery and cervical right vertebral artery with no plaque or stenosis. Non dominant left vertebral artery arises directly from the arch and has a late entry into the cervical transverse foramen. It is diminutive but remains patent to the skull base with no plaque or stenosis identified. CTA HEAD Posterior circulation: Dominant right vertebral artery. Patent vertebrobasilar junction. No distal vertebral plaque or stenosis. Non dominant left vertebral nearly terminates in PICA. Normal PICA origins. Patent basilar artery with tortuosity. Tortuous bilateral fetal type PCA origins. Basilar tip and SCA origins are patent. The basilar tip is mildly tortuous but there is no convincing aneurysm. Bilateral PCA branches are within normal limits. Anterior circulation: Both ICA siphons are patent. Left siphon moderate calcified plaque with mild supraclinoid stenosis. Normal left posterior communicating artery origin. Right siphon mild to moderate calcified plaque, no significant siphon stenosis. Normal right posterior communicating artery origin. Patent carotid termini, MCA and ACA origins. Tortuous A1 segments and anterior communicating artery. Tortuous ACA A2 branches and there are 2 different segmental areas of fusiform and beaded enlargement of the left ACA in both the proximal A2 and A2/A3 junctions (series 16, image 26. The more distal of these has a saccular component projecting to the midline measuring 3-4 mm as seen on series 10, image 159 and series 15, image 21. And distal to both abnormal segments the left ACA is stenotic but not occluded (series 16, image 26). The right ACA branches appear more normal. Left MCA M1 and  bifurcation are tortuous, patent. However, there is moderate to severe stenosis of the left MCA anterior M2 branch best seen on series 102, image 200. No right MCA branch occlusion identified. Right MCA M1 segment and bifurcation are tortuous without stenosis. Right MCA branches are within normal limits. Venous sinuses: Early contrast timing, not evaluated. Anatomic variants: Non dominant left vertebral artery arises from the aorta, nearly terminates in PICA. Fetal type bilateral PCA origins. Review of the MIP images confirms the above findings IMPRESSION: 1. Negative for large vessel occlusion. 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 This might be an unusual presentation of Fibromuscular  Dysplasia (FMD). Mycotic vessel changes are also a consideration. 2. 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 Bilateral ICA siphon calcified plaque but only mild associated siphon stenosis. 3. No significant extracranial atherosclerosis or stenosis. Generalized arterial tortuosity in the head and neck. 4. Expected subtle CT appearance of the superior left perirolandic small infarct. No associated hemorrhage or mass effect. Electronically Signed   By: Marlise Simpers M.D.   On: 03/23/2024 09:20   MR BRAIN WO CONTRAST Result Date: 03/21/2024 CLINICAL DATA:  Right-sided weakness EXAM: MRI HEAD WITHOUT CONTRAST TECHNIQUE: Multiplanar, multiecho pulse sequences of the brain and surrounding structures were obtained without intravenous contrast. COMPARISON:  None Available. FINDINGS: Brain: Small focus of acute ischemia within the posterior left frontal lobe, within the ACA territory. Multifocal chronic microhemorrhage in the left temporal lobe. There is confluent  hyperintense T2-weighted signal within the white matter. Generalized volume loss. Old left frontal and left cerebellar infarcts. The midline structures are normal. Vascular: Normal flow voids. Skull and upper cervical spine: Normal calvarium and skull base. Visualized upper cervical spine and soft tissues are normal. Sinuses/Orbits:Right ostiomeatal pattern sinusitis.  Normal orbits. IMPRESSION: 1. Small focus of acute ischemia within the posterior left frontal lobe, within the ACA territory. No acute hemorrhage or mass effect. 2. Old left frontal and left cerebellar infarcts. 3. Right ostiomeatal complex pattern sinusitis. Electronically Signed   By: Juanetta Nordmann M.D.   On: 03/21/2024 21:58   CT HEAD WO CONTRAST Result Date: 03/21/2024 CLINICAL DATA:  Right leg numbness EXAM: CT HEAD WITHOUT CONTRAST TECHNIQUE: Contiguous axial images were obtained from the base of the skull through the vertex without intravenous contrast. RADIATION DOSE REDUCTION: This exam was performed according to the departmental dose-optimization program which includes automated exposure control, adjustment of the mA and/or kV according to patient size and/or use of iterative reconstruction technique. COMPARISON:  CT 02/12/2021 FINDINGS: Brain: No acute territorial infarction, hemorrhage or intracranial mass. Atrophy and moderate severe chronic small vessel ischemic changes of the white matter. Stable ventricle size and morphology. 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  Carotid vascular calcification Skull: No fracture Sinuses/Orbits: Moderate mucosal thickening in the maxillary sinuses, ethmoid sinuses and right frontal sinus. Other: None IMPRESSION: 1. No CT evidence for acute intracranial abnormality. 2. Atrophy and chronic small vessel ischemic changes of the white matter. Small chronic infarct in the left cerebellum. Electronically Signed   By: Esmeralda Hedge M.D.   On: 03/21/2024 18:27     Microbiology: Results for orders placed or performed in visit on 11/06/23  CULTURE, URINE COMPREHENSIVE     Status: None   Collection Time: 11/06/23 10:56 AM   Specimen: Urine   UR  Result Value Ref Range Status   Urine Culture, Comprehensive Final report  Final   Organism ID, Bacteria Comment  Final    Comment: Mixed urogenital flora Less than 10,000 colonies/mL   Microscopic Examination     Status: Abnormal   Collection Time: 11/06/23 10:56 AM   Urine  Result Value Ref Range Status   WBC, UA 0-5 0 - 5 /hpf Final   RBC, Urine 0-2 0 - 2 /hpf Final   Epithelial Cells (non renal) 0-10 0 - 10 /hpf Final   Casts Present (A) None seen /lpf Final   Cast Type Hyaline casts N/A Final   Mucus, UA Present (A) Not Estab. Final   Bacteria, UA  Few None seen/Few Final    Discharge time spent: greater than 30 minutes.  Signed: Melvinia Stager, MD [facility] 03/31/2024

## 2024-04-11 ENCOUNTER — Ambulatory Visit: Admitting: Podiatry

## 2024-04-23 ENCOUNTER — Ambulatory Visit: Admitting: Podiatry

## 2024-04-29 ENCOUNTER — Telehealth: Payer: Self-pay

## 2024-04-29 ENCOUNTER — Other Ambulatory Visit: Payer: Self-pay

## 2024-04-29 NOTE — Progress Notes (Signed)
 Complex Care Management Care Guide Note  04/29/2024 Name: Ronnie Bullock MRN: [id number] DOB: 1946-07-30  Ronnie Bullock is a 78 y.o. year old male who is a primary care patient of Vicci Duwaine SQUIBB, DO and is actively engaged with the care management team. I reached out to Ronnie Bullock by phone today to assist with re-scheduling  with the RN Case Manager.  Follow up plan: Telephone appointment with complex care management team member scheduled for:  05-14-24  Leotis Rase [facility], [facility] Guide  Direct Dial: [phone]  Fax [phone]

## 2024-05-02 ENCOUNTER — Telehealth: Payer: Self-pay | Admitting: *Deleted

## 2024-05-02 NOTE — Patient Outreach (Signed)
 Phone call from patient's sister stating that patient was recently discharged from [facility] care following a stroke. Per patient's sister, he was discharged with home health PT/OT and a wheelchair but has not received these services to date. Phone call to [employer], they have the order for the wheelchair, however they are requesting chart notes to justify need for the chair to be faxed to [phone]. Phone call to [facility], left message with discharge planner Roxie Gelineau to follow up on [facility] and DME orders.  Ronnie Spink, LCSW [place]  [facility], [facility] Licensed Clinical Social Worker  Direct Dial: [phone]

## 2024-05-03 ENCOUNTER — Ambulatory Visit: Payer: Medicare HMO | Admitting: Urology

## 2024-05-03 ENCOUNTER — Ambulatory Visit: Payer: Self-pay | Admitting: Urology

## 2024-05-06 ENCOUNTER — Other Ambulatory Visit: Payer: Medicare HMO

## 2024-05-06 ENCOUNTER — Telehealth: Payer: Self-pay | Admitting: *Deleted

## 2024-05-06 NOTE — Patient Outreach (Signed)
 Phone call from patient's sister stating that she has not received a call back from [facility] regarding the status of the wheelchair. Phone call to [facility] with Daise[facility] who states that order was sent but was held for co-pay.  Phone call to [employer] confirming that patient will need to set up 5.00 monthly co-payments for the wheelchair. Once payments are set up by credit care, wheelchair can be delivered. Contact phone number for [employer] [phone].  Per Krisitin at [facility], referral for [facility] services made to [facility]. Phone call made to [facility] confirming that they have not received the discharge orders from [facility]. VM left for Allean at [facility] requesting that discharge orders be faxed to [facility] at [phone].   Travonta Gill, LCSW [place]  [facility], [facility] Licensed Clinical Social Worker  Direct Dial: [phone]

## 2024-05-14 ENCOUNTER — Telehealth: Payer: Self-pay

## 2024-05-14 ENCOUNTER — Other Ambulatory Visit: Payer: Self-pay

## 2024-05-14 NOTE — Patient Outreach (Signed)
 Complex Care Management   Visit Note  05/14/2024  Name:  Ronnie Bullock MRN: [id number] DOB: [birth year]  Situation: Referral received for Complex Care Management related to Stroke and Impaired Mobility. I obtained verbal consent from Caregiver.  Visit completed with DPR/caregiver/sister, Stephane Glatter   on the phone. Main concern is that patient was supposed to received HHPT with [facility] after discharge from [facility] SN[facility] but has not received any calls to schedule home visit.  He has deconditioned and is unable to walk. She is also reluctant to pick up a wheelchair due to cost and she is hoping he will get back to baseline where he was walking.   Background:   Past Medical History:  Diagnosis Date   Dementia (HCC)    Dementia (HCC)    Hyperlipidemia    Hypertension    Incontinence     Assessment: Patient Reported Symptoms:  Cognitive        Neurological Neurological Review of Symptoms: No symptoms reported    HEENT        Cardiovascular Cardiovascular Symptoms Reported: Swelling in legs or feet    Respiratory      Endocrine      Gastrointestinal Gastrointestinal Symptoms Reported: Incontinence Additional Gastrointestinal Details: Wears adult brief      Genitourinary Genitourinary Symptoms Reported: Incontinence Additional Genitourinary Details: Wears adult brief    Integumentary      Musculoskeletal Musculoskelatal Symptoms Reviewed: Difficulty walking, Weakness, Limited mobility        Psychosocial       Quality of Family Relationships: supportive, helpful, involved      12/13/2023    9:28 AM  Depression screen PHQ 2/9  Decreased Interest 0  Down, Depressed, Hopeless 0  PHQ - 2 Score 0  Altered sleeping 0  Tired, decreased energy 0  Change in appetite 0  Feeling bad or failure about yourself  0  Trouble concentrating 0  Moving slowly or fidgety/restless 0  Suicidal thoughts 0  PHQ-9 Score 0    There were no vitals filed  for this visit.  Medications Reviewed Today   Medications were not reviewed in this encounter     Recommendation:   Continue Current Plan of Care  Follow Up Plan:   Telephone follow-up two weeks  Santana Stamp BSN, CCM [place]  [facility] Population Health RN Care Manager Direct Dial: [phone]  Fax: [phone]

## 2024-05-14 NOTE — Patient Outreach (Signed)
 Spoke with Olam at [facility] [phone].  They received orders for HHPT on 04/19/24 but then patient went into SNF instead of home so orders were cancelled.  Olam states orders can be given by PCP but an office visit must be made or at least a VIRTUAL call addressing patient's immobility needs can be done if the patient has difficulty with leaving the home.  This RNCM will call patient's sister, Stephane to discuss.

## 2024-05-16 ENCOUNTER — Telehealth: Payer: Self-pay

## 2024-05-16 NOTE — Patient Outreach (Signed)
 Three attempts this morning to contact Kristen/Outpatient services at [facility], no answer, not able to leave a VM.  (Attempting to contact to discuss orders for home health that sister of patients states patient was suppose to receive after discharge).

## 2024-05-16 NOTE — Patient Outreach (Signed)
 Attempted to reach Kristen/Outpatient services at [facility] to inquire if orders were faxed to [facility] for home health services post discharge from SNF, no answer, no way to leave message.

## 2024-05-17 ENCOUNTER — Ambulatory Visit: Admitting: Urology

## 2024-05-17 ENCOUNTER — Telehealth: Payer: Self-pay

## 2024-05-17 VITALS — BP 137/83 | HR 61 | Ht 68.0 in | Wt 150.0 lb

## 2024-05-17 DIAGNOSIS — R399 Unspecified symptoms and signs involving the genitourinary system: Secondary | ICD-10-CM | POA: Diagnosis not present

## 2024-05-17 DIAGNOSIS — C61 Malignant neoplasm of prostate: Secondary | ICD-10-CM

## 2024-05-17 DIAGNOSIS — R1909 Other intra-abdominal and pelvic swelling, mass and lump: Secondary | ICD-10-CM | POA: Diagnosis not present

## 2024-05-17 NOTE — Telephone Encounter (Signed)
 Copied from CRM [id number]. Topic: General - Other >> May 15, 2024  9:12 AM Aleatha C wrote: Reason for CRM: Patient was release from [facility] 1 month ago and was suppose to get in home physical therapy right after release Patient sister has been working with a Technical sales engineer lane who advised her to inform  Dr Duwaine Louder of the neglect from [place] and would like information on how to get someone to come visit patient as soon as possible please contact via phone [phone] Ronnie Bullock sister, sister also wanted to inform Dr Louder that cancer is also back >> May 16, 2024  2:10 PM Emylou G wrote: Ronnie ( sister called ) checking status for patient on getting his therapy.. See original message

## 2024-05-17 NOTE — Patient Instructions (Signed)
 Scheduling number: [phone]

## 2024-05-17 NOTE — Telephone Encounter (Signed)
 Call placed after chart research to [facility] where patient was discharge from after he suffered a stroke. Spoke with Crystal who advises that the discharge planning team is not yet in the facility for the morning but would be sure to pass the message along to them (contact should be Teka) and ask they return my call. Direct number given in the message that will be relied to them.    [facility] [address] [place], [place] [address]  [phone]

## 2024-05-18 LAB — PSA: Prostate Specific Ag, Serum: 0.1 ng/mL (ref 0.0–4.0)

## 2024-05-19 ENCOUNTER — Encounter: Payer: Self-pay | Admitting: Urology

## 2024-05-19 NOTE — Progress Notes (Signed)
 05/17/2024 10:49 AM   Ronnie Bullock 1946-09-09 [id number]  Referring provider: Vicci Duwaine SQUIBB, DO [address] [place],  [place] [address]  Chief Complaint  Patient presents with   Groin Swelling   Urologic history: 1.  Intermediate risk (unfavorable) prostate cancer -Diagnosed 06/2019; prostate volume 63 g; PSA 5.7 -IMRT completed 10/02/2019   HPI: Ronnie Bullock is a 78 y.o. male who presents in follow-up for recent hospitalization.  He was with his sister, Ronnie Bullock who contributed to the history.  History of prostate cancer and I last saw him in January 2022 He saw Dr. Gaston December 2024 for lower urinary tract symptoms Hospitalized May 2025 for acute CVA His sister states she was told by his SNF that he was diagnosed with recurrent prostate cancer during that hospitalization and was on medication for prostate cancer.  He was recently discharged from SNF She states there is also a large knot in his right groin area Last PSA June 2024 was 0.16 History large bilateral hydroceles on scrotal ultrasound 2019 Stable urinary frequency, urgency and urge incontinence; bladder scan December 2024 was 90 mL  PMH: Past Medical History:  Diagnosis Date   Dementia (HCC)    Dementia (HCC)    Hyperlipidemia    Hypertension    Incontinence     Surgical History: Past Surgical History:  Procedure Laterality Date   COLONOSCOPY WITH PROPOFOL  N/A 05/16/2018   Procedure: COLONOSCOPY WITH PROPOFOL ;  Surgeon: Janalyn Keene NOVAK, MD;  Location: [facility] ENDOSCOPY;  Service: Endoscopy;  Laterality: N/A;   PERIPHERAL VASCULAR THROMBECTOMY Left 01/22/2021   Procedure: PERIPHERAL VASCULAR THROMBECTOMY / THROMBOLYSIS;  Surgeon: Jama Cordella MATSU, MD;  Location: [facility] INVASIVE CV LAB;  Service: Cardiovascular;  Laterality: Left;    Home Medications:  Allergies as of 05/17/2024   No Known Allergies      Medication List        Accurate as of May 17, 2024 11:59 PM. If you have any  questions, ask your nurse or doctor.          amLODipine  5 MG tablet Commonly known as: NORVASC  TAKE 1 TABLET BY MOUTH ONCE EVERY MORNING   apixaban  5 MG Tabs tablet Commonly known as: Eliquis  Take 1 tablet (5 mg total) by mouth 2 (two) times daily.   cetirizine  10 MG tablet Commonly known as: ZYRTEC  Take 1 tablet (10 mg total) by mouth daily.   donepezil  5 MG tablet Commonly known as: ARICEPT  TAKE 1 TABLET BY MOUTH AT BEDTIME   multivitamin with minerals Tabs tablet Take 1 tablet by mouth daily.   mupirocin  cream 2 % Commonly known as: BACTROBAN  Apply topically 2 (two) times daily.   tamsulosin  0.4 MG Caps capsule Commonly known as: FLOMAX  Take 2 capsules (0.8 mg total) by mouth daily.   triamcinolone  cream 0.1 % Commonly known as: KENALOG  Apply 1 Application topically 2 (two) times daily. What changed:  when to take this reasons to take this        Allergies: No Known Allergies  Family History: Family History  Problem Relation Age of Onset   Hypertension Mother    Prostate cancer Neg Hx    Bladder Cancer Neg Hx    Kidney cancer Neg Hx     Social History:  reports that he has never smoked. He has never been exposed to tobacco smoke. He has never used smokeless tobacco. He reports that he does not drink alcohol and does not use drugs.   Physical Exam:  BP 137/83   Pulse 61   Ht 5' 8 (1.727 m)   Wt 150 lb (68 kg)   BMI 22.81 kg/m   Constitutional:  Alert, No acute distress. HEENT: Ronnie Bullock AT Respiratory: Normal respiratory effort, no increased work of breathing. GU: Testes descended bilaterally.  There is a large cystic mass in the right groin region measuring ~10 cm.  Nontender.   Assessment & Plan:    1. Prostate cancer Recent hospitalization records were reviewed.  There is no mention of recurrent prostate cancer diagnosis and a PSA was not drawn.  His last PSA remained low and stable Medication list was reviewed to his sister was given from the  SNF and it states he is on tamsulosin  for prostate cancer.  We discussed that tamsulosin  is not a prostate cancer drug and is a medication for BPH. PSA drawn today  2.  Right groin mass History of large bilateral hydroceles however testes are descended bilaterally and his masses in the groin region.  We discussed the possibility of inguinal hernia.  CT abdomen/pelvis was ordered  3.  Lower urinary tract symptoms Stable   Ronnie JAYSON Barba, MD  [facility] [address] [place], [place] [address] [phone]

## 2024-05-20 ENCOUNTER — Ambulatory Visit: Payer: Self-pay | Admitting: Urology

## 2024-05-29 ENCOUNTER — Telehealth: Payer: Self-pay

## 2024-05-29 DIAGNOSIS — F0390 Unspecified dementia without behavioral disturbance: Secondary | ICD-10-CM

## 2024-05-29 NOTE — Patient Outreach (Signed)
 Incoming call from [place] at [facility], acknowledging this RNCM's request for HHPT/OT.  States patient has not been seen at the office in months, he may need a face-to-face, she will speak to Dr. Vicci and will call patient's sister for further instructions.

## 2024-05-29 NOTE — Patient Outreach (Signed)
 Left message with staff requesting order for home health PT/OT due to: per patient's sister, Stephane Glatter, patient had recent stay at [facility], discharged in June, with [facility] orders for PT/OT. As of today, no one has called to schedule evaluation.  Dawn has tried to contact [facility] to get orders completed with no results.  This RNCM has also tried to call SNF with no results even getting anyone to answer the phone.

## 2024-05-29 NOTE — Patient Outreach (Signed)
 Complex Care Management   Visit Note  05/29/2024  Name:  Ronnie Bullock MRN: [id number] DOB: 1946-10-17  Situation: Referral received for Complex Care Management related to Impaired mobility I obtained verbal consent from Caregiver.  Visit completed with Stephane Glatter, sister/DPR  on the phone  Background:   Past Medical History:  Diagnosis Date   Dementia (HCC)    Dementia (HCC)    Hyperlipidemia    Hypertension    Incontinence     Assessment: Patient Reported Symptoms:  Cognitive Cognitive Status: Able to follow simple commands, Requires Assistance Decision Making, Struggling with memory recall (Spoke with sister, Dawn, on this contact.)      Neurological Neurological Review of Symptoms: Not assessed    HEENT HEENT Symptoms Reported: Not assessed      Cardiovascular Cardiovascular Symptoms Reported: Not assessed    Respiratory Respiratory Symptoms Reported: No symptoms reported    Endocrine Endocrine Symptoms Reported: Not assessed    Gastrointestinal Gastrointestinal Symptoms Reported: Incontinence Additional Gastrointestinal Details: Wears adult brief, transfers to [facility].      Genitourinary Genitourinary Symptoms Reported: Incontinence Additional Genitourinary Details: Wears adult brief, transfer to [facility]    Integumentary Integumentary Symptoms Reported: Not assessed    Musculoskeletal Musculoskelatal Symptoms Reviewed: Difficulty walking        Psychosocial Psychosocial Symptoms Reported: No symptoms reported Behavioral Management Strategies: Support system   Quality of Family Relationships: helpful, involved, supportive      12/13/2023    9:28 AM  Depression screen PHQ 2/9  Decreased Interest 0  Down, Depressed, Hopeless 0  PHQ - 2 Score 0  Altered sleeping 0  Tired, decreased energy 0  Change in appetite 0  Feeling bad or failure about yourself  0  Trouble concentrating 0  Moving slowly or fidgety/restless 0  Suicidal thoughts 0  PHQ-9 Score 0     There were no vitals filed for this visit.  Medications Reviewed Today   Medications were not reviewed in this encounter     Recommendation:   Acute PCP follow-up : this RNCM called PCP office to inquire if PCP can place order for HHPT/OT due to [facility] SNF failed to follow through on discharge orders for HHPT/OT. Patient may need face-to-face visit, and if PCP would consider Virtual Visit due to patient has difficulty with ambulation, leaving the home. . Home Health requests: Occupational Therapy Physical therapy Referral placed to BSW for assistance with applying for Medicaid - daughter would be applying since patient has Dementia .   Follow Up Plan:   Telephone follow-up two days.  Santana Stamp BSN, CCM [place]  [facility] Population Health RN Care Manager Direct Dial: [phone]  Fax: [phone]

## 2024-05-29 NOTE — Patient Instructions (Signed)
 Visit Information  Thank you for taking time to visit with me today. Please don't hesitate to contact me if I can be of assistance to you before our next scheduled appointment.  Your next care management appointment is by telephone on Friday at 10:45am.     Please call the care guide team at [phone] if you need to cancel, schedule, or reschedule an appointment.   A reminder to ALL patients/family/friends, please call the [place]  National Suicide Prevention Lifeline: 773 245 1609 or TTY: 570-860-1915 TTY (213) [phone]) to talk to a trained counselor if you are experiencing a Mental Health or Behavioral Health Crisis or need someone to talk to.  Santana Stamp BSN, CCM [place]  [facility] Population Health RN Care Manager Direct Dial: [phone]  Fax: [phone]

## 2024-05-30 ENCOUNTER — Telehealth: Payer: Self-pay

## 2024-05-30 NOTE — Progress Notes (Signed)
   Telephone encounter was:  Successful.  Complex Care Management Note Care Guide Note  05/30/2024 Name: BRANCH PACITTI MRN: [id number] DOB: Apr 15, 1946  [name] Brighter is a 78 y.o. year old male who is a primary care patient of Vicci Duwaine SQUIBB, DO . The community resource team was consulted for assistance with Food Insecurity and Financial Difficulties related to Financial strain  SDOH screenings and interventions completed:  Yes        Care guide performed the following interventions: Patient provided with information about care guide support team and interviewed to confirm resource needs.  Follow Up Plan:  Care guide will follow up with patient by phone over the next week  Encounter Outcome:  Patient Visit Completed    Jon Colt [facility]  [facility] Guide, Phone: [phone] Fax: [phone] Website: [email or web address]

## 2024-05-31 ENCOUNTER — Ambulatory Visit
Admission: RE | Admit: 2024-05-31 | Discharge: 2024-05-31 | Disposition: A | Discharge status: Home / Self Care | Source: Ambulatory Visit | Attending: Urology | Admitting: Urology

## 2024-05-31 ENCOUNTER — Telehealth: Payer: Self-pay

## 2024-05-31 DIAGNOSIS — C61 Malignant neoplasm of prostate: Secondary | ICD-10-CM | POA: Insufficient documentation

## 2024-05-31 MED ORDER — IOHEXOL 300 MG/ML  SOLN
100.0000 mL | Freq: Once | INTRAMUSCULAR | Status: AC | PRN
  Administered 2024-05-31: 100 mL via INTRAVENOUS

## 2024-06-07 ENCOUNTER — Telehealth: Payer: Self-pay

## 2024-06-07 NOTE — Patient Outreach (Signed)
 Complex Care Management   Visit Note  06/07/2024  Name:  Ronnie Bullock MRN: [id number] DOB: 10-10-1946  Situation: Referral received for Complex Care Management related to Impaired Mobility, medication management. I obtained verbal consent from Caregiver.  Visit completed with Stephane Glatter, sister  on the phone. Main concern: while reconciling medications, discovered patient does not have amlodipine  in the home, not in his pill packaging system.  He has not had post SNF follow up appointment with PCP, therefore, we were not able to get HHPT/OT set up (SNF did not follow through on faxing orders, was unable to contact SNF).    Background:   Past Medical History:  Diagnosis Date   Dementia (HCC)    Dementia (HCC)    Hyperlipidemia    Hypertension    Incontinence     Assessment: Patient Reported Symptoms:  Cognitive Cognitive Status: Able to follow simple commands, Requires Assistance Decision Making, Struggling with memory recall      Neurological Neurological Review of Symptoms: No symptoms reported    HEENT HEENT Symptoms Reported: No symptoms reported      Cardiovascular Cardiovascular Symptoms Reported: No symptoms reported    Respiratory Respiratory Symptoms Reported: No symptoms reported    Endocrine Endocrine Symptoms Reported: No symptoms reported    Gastrointestinal Gastrointestinal Symptoms Reported: Not assessed      Genitourinary Genitourinary Symptoms Reported: Not assessed    Integumentary      Musculoskeletal Musculoskelatal Symptoms Reviewed: Difficulty walking Additional Musculoskeletal Details: Sister declines making appt with PCP in order to be able to get HHPT/OT at this time.  This RNCM will further evaluate next week after patient attends full time daycare. Musculoskeletal Self-Management Outcome: 3 (uncertain) Falls in the past year?: No    Psychosocial Psychosocial Symptoms Reported: Not assessed            12/13/2023    9:28 AM   Depression screen PHQ 2/9  Decreased Interest 0  Down, Depressed, Hopeless 0  PHQ - 2 Score 0  Altered sleeping 0  Tired, decreased energy 0  Change in appetite 0  Feeling bad or failure about yourself  0  Trouble concentrating 0  Moving slowly or fidgety/restless 0  Suicidal thoughts 0  PHQ-9 Score 0    There were no vitals filed for this visit.  Medications Reviewed Today     Reviewed by Lucian Santana LABOR, RN (Registered Nurse) on 06/07/24 at 1130  Med List Status: <None>   Medication Order Taking? Sig Documenting Provider Last Dose Status Informant  amLODipine  (NORVASC ) 5 MG tablet [id number] Yes TAKE 1 TABLET BY MOUTH ONCE EVERY MORNING Johnson, Megan P, DO  Active Self  apixaban  (ELIQUIS ) 5 MG TABS tablet [id number] Yes Take 1 tablet (5 mg total) by mouth 2 (two) times daily. Johnson, Megan P, DO  Active Self  cetirizine  (ZYRTEC ) 10 MG tablet [id number] Yes Take 1 tablet (10 mg total) by mouth daily. Melvin Pao, NP  Active Self  donepezil  (ARICEPT ) 5 MG tablet [id number] Yes TAKE 1 TABLET BY MOUTH AT BEDTIME Vicci Bouchard P, DO  Active Self  Multiple Vitamin (MULTIVITAMIN WITH MINERALS) TABS tablet [id number] Yes Take 1 tablet by mouth daily. Valerio Moris T, NP  Active Self  mupirocin  cream (BACTROBAN ) 2 % [id number] Yes Apply topically 2 (two) times daily. Patel, Sona, MD  Active   tamsulosin  (FLOMAX ) 0.4 MG CAPS capsule [id number] Yes Take 2 capsules (0.8 mg total) by mouth daily. Gaston Hamilton, MD  Active Self  triamcinolone  cream (  KENALOG ) 0.1 % [id number] Yes Apply 1 Application topically 2 (two) times daily. Cannady, Jolene T, NP  Active Self            Recommendation:   -Specialty provider follow-up : sister is awaiting further instructions from surgeon who will be assessing hernia found on CT scan of pelvis on 05/31/24.  -Encouraged sister to make post SNF follow up appointment with PCP.   -Family will take prescriptions given to them from SNF to Joane Molly for refills.  -Patient will be going back to [employer] next week for full days.  This RNCM will contact Dawn (sister) for mobility status.   Follow Up Plan:   Telephone follow-up in 1 week  Santana Stamp BSN, CCM [place]  [facility] Population Health RN Care Manager Direct Dial: [phone]  Fax: [phone]

## 2024-06-07 NOTE — Patient Instructions (Signed)
 Visit Information  Thank you for taking time to visit with me today. Please don't hesitate to contact me if I can be of assistance to you before our next scheduled appointment.  Your next care management appointment is by telephone on Thursday, August 7th at 9:30am.   Please call the care guide team at [phone] if you need to cancel, schedule, or reschedule an appointment.   A reminder to ALL patients/family/friends, please call the [place]  National Suicide Prevention Lifeline: 407 628 7846 or TTY: 215-338-8120 TTY 431-562-5536) to talk to a trained counselor if you are experiencing a Mental Health or Behavioral Health Crisis or need someone to talk to.  Santana Stamp BSN, CCM [place]  [facility] Population Health RN Care Manager Direct Dial: [phone]  Fax: [phone]

## 2024-06-10 ENCOUNTER — Other Ambulatory Visit: Payer: Self-pay

## 2024-06-10 DIAGNOSIS — K4090 Unilateral inguinal hernia, without obstruction or gangrene, not specified as recurrent: Secondary | ICD-10-CM

## 2024-06-12 ENCOUNTER — Telehealth: Payer: Self-pay

## 2024-06-12 NOTE — Telephone Encounter (Signed)
   Telephone encounter was:  Successful.  Complex Care Management Note Care Guide Note  06/12/2024 Name: [name] MRN: [id number] DOB: January 15, 1946  Ronnie Bullock Brighter is a 78 y.o. year old male who is a primary care patient of Vicci Duwaine SQUIBB, DO . The community resource team was consulted for assistance with Food Insecurity and Financial Difficulties related to financial strain  SDOH screenings and interventions completed:  No        Care guide performed the following interventions: Patient provided with information about care guide support team and interviewed to confirm resource needs.PT has been added to [facility] waiting list and a referral for [employer] will be delivered starting next week 06/17/2024 . I have mailed resources for financial assistance as well as gave resources over the phone to [facility] pts sister   Follow Up Plan:  No further follow up planned at this time. The patient has been provided with needed resources.  Encounter Outcome:  Patient Visit Completed    Jon Colt [facility]  [facility] Guide, Phone: [phone] Fax: [phone] Website: [email or web address]

## 2024-06-12 NOTE — Progress Notes (Signed)
   Telephone encounter was:  Successful.  Complex Care Management Note Care Guide Note  06/24/2024 Name: Ronnie Bullock MRN: [id number] DOB: 1945-12-11  Ronnie Bullock is a 78 y.o. year old male who is a primary care patient of Vicci Duwaine SQUIBB, DO . The community resource team was consulted for assistance with Food Insecurity and Financial Difficulties related to Financial strain  SDOH screenings and interventions completed:  Yes        Care guide performed the following interventions: Patient provided with information about care guide support team and interviewed to confirm resource needs.PT has been added to [facility] waiting list and a referral for [employer]. I have mailed resources for financial assistance as well as gave resources over the phone to [facility] pts sister   Follow Up Plan:  No further follow up planned at this time. The patient has been provided with needed resources.  Encounter Outcome:  Patient Visit Completed    Jon Colt [facility]  [facility] Guide, Phone: [phone] Fax: [phone] Website: [email or web address]

## 2024-06-13 ENCOUNTER — Other Ambulatory Visit: Payer: Self-pay

## 2024-06-13 NOTE — Patient Outreach (Signed)
 Complex Care Management   Visit Note  06/13/2024  Name:  Ronnie Bullock MRN: [id number] DOB: 1946/01/20  Situation: Referral received for Complex Care Management related to Impaired Mobility.  I obtained verbal consent from Caregiver.  Visit completed with Stephane Glatter, sister  on the phone. No urgent concerns today.  States Mr. Ciancio started back to adult daycare at [facility], feels ambulation is better, staff helps him up to the bathroom with one-person assistance.  She feels this will be better for him instead of pursuing home health PT.   Background:   Past Medical History:  Diagnosis Date   Dementia (HCC)    Dementia (HCC)    Hyperlipidemia    Hypertension    Incontinence     Assessment: Patient Reported Symptoms:  Cognitive Cognitive Status: Able to follow simple commands, Requires Assistance Decision Making, Struggling with memory recall      Neurological Neurological Review of Symptoms: No symptoms reported    HEENT HEENT Symptoms Reported: Not assessed      Cardiovascular Cardiovascular Symptoms Reported: Not assessed    Respiratory Respiratory Symptoms Reported: Not assesed    Endocrine Endocrine Symptoms Reported: Not assessed    Gastrointestinal Gastrointestinal Symptoms Reported: Not assessed      Genitourinary Genitourinary Symptoms Reported: Not assessed    Integumentary Integumentary Symptoms Reported: No symptoms reported    Musculoskeletal Musculoskelatal Symptoms Reviewed: Limited mobility, Unsteady gait Additional Musculoskeletal Details: States patient is doing better with ambulation, continues to be unsteady but using his walker.  He has started back with going to [facility] and she feels this all-day interaction and supervision will help.  They help him get to the bathroom, walk him around, helps with his lunch, it's a good thing for him. Musculoskeletal Self-Management Outcome: 4 (good)      Psychosocial Psychosocial Symptoms  Reported: Not assessed            12/13/2023    9:28 AM  Depression screen PHQ 2/9  Decreased Interest 0  Down, Depressed, Hopeless 0  PHQ - 2 Score 0  Altered sleeping 0  Tired, decreased energy 0  Change in appetite 0  Feeling bad or failure about yourself  0  Trouble concentrating 0  Moving slowly or fidgety/restless 0  Suicidal thoughts 0  PHQ-9 Score 0    There were no vitals filed for this visit.  Medications Reviewed Today   Medications were not reviewed in this encounter     Recommendation:   -Sister needs to make appointment with PCP for follow up. She is also taking care of another brother and trying to balance care. She is aware that if she wants to pursue HHPT/OT, Mr. Perrier will need a face-to-face appointment with PCP.   -Sister will pick up Amlodipine  from pharmacy.    Follow Up Plan:   Telephone follow-up two weeks.   Santana Stamp BSN, CCM [place]  [facility] Population Health RN Care Manager Direct Dial: [phone]  Fax: [phone]

## 2024-06-13 NOTE — Patient Instructions (Signed)
 Visit Information  Thank you for taking time to visit with me today. Please don't hesitate to contact me if I can be of assistance to you before our next scheduled appointment.  Your next care management appointment is by telephone on Thursday, August 21st at 9:30am.    Please call the care guide team at [phone] if you need to cancel, schedule, or reschedule an appointment.   A reminder to ALL patients/family/friends, please call the [place]  National Suicide Prevention Lifeline: (251)158-5741 or TTY: (214) 045-0241 TTY 971 566 2742) to talk to a trained counselor if you are experiencing a Mental Health or Behavioral Health Crisis or need someone to talk to.  Santana Stamp BSN, CCM [employer]  [facility] Population Health RN Care Manager Direct Dial: [phone]  Fax: [phone]

## 2024-06-27 ENCOUNTER — Telehealth: Payer: Self-pay

## 2024-07-09 ENCOUNTER — Telehealth: Payer: Self-pay

## 2024-07-11 ENCOUNTER — Ambulatory Visit: Admitting: General Surgery

## 2024-07-16 ENCOUNTER — Telehealth: Payer: Self-pay

## 2024-07-16 NOTE — Telephone Encounter (Signed)
 Copied from CRM #[id number]. Topic: Clinical - Home Health Verbal Orders >> May 29, 2024 12:50 PM Delon DASEN wrote: Caller/Agency: Santana with [facility] Callback Number: [phone] Service Requested: Physical Therapy and Occupational Therapy Frequency: needs eval Any new concerns about the patient? Yes- patient is deconditioning due to not getting any services since discharge from Hospital >> May 29, 2024  1:18 PM CMA Luria Rosario L wrote: Spoke with Santana. Pt's family requesting home health. Pt has not seen Dr. Vicci since 08/2023. I will follow up with DJ.   [facility].

## 2024-07-18 ENCOUNTER — Telehealth: Payer: Self-pay

## 2024-07-18 NOTE — Telephone Encounter (Signed)
 Copied from CRM #[id number]. Topic: Clinical - Medication Question >> Jul 18, 2024  3:33 PM Willma R wrote: Reason for CRM: Lorn from [facility] calling to see if patients medication is changing and if she can have an updated list faxed to [phone].  Lorn can be reached at [phone]

## 2024-07-22 ENCOUNTER — Other Ambulatory Visit: Payer: Self-pay | Admitting: Physician Assistant

## 2024-07-22 NOTE — Telephone Encounter (Signed)
 Spoken with Lorn

## 2024-07-23 ENCOUNTER — Other Ambulatory Visit: Payer: Self-pay | Admitting: Family Medicine

## 2024-07-23 NOTE — Telephone Encounter (Signed)
 Copied from CRM [id number]. Topic: Clinical - Prescription Issue >> Jul 23, 2024  3:11 PM Tysheama G wrote: Reason for CRM: Mandy from [facility] is calling to refill apixaban  (ELIQUIS ) 5 MG TABS tablet , she stated the box was expired and it's not letting me refill on my end. Callback number [phone]

## 2024-07-23 NOTE — Telephone Encounter (Unsigned)
 Copied from CRM [id number]. Topic: Clinical - Medication Refill >> Jul 23, 2024  3:09 PM Tysheama G wrote: Medication: apixaban  (ELIQUIS ) 5 MG TABS tablet  Has the patient contacted their pharmacy? Yes (Agent: If no, request that the patient contact the pharmacy for the refill. If patient does not wish to contact the pharmacy document the reason why and proceed with request.) (Agent: If yes, when and what did the pharmacy advise?)  This is the patient's preferred pharmacy:    [facility] - [place], [place] - [address] [place] [address] Phone: [phone] Fax: [phone]  Is this the correct pharmacy for this prescription? Yes If no, delete pharmacy and type the correct one.   Has the prescription been filled recently? No  Is the patient out of the medication? Yes  Has the patient been seen for an appointment in the last year OR does the patient have an upcoming appointment? Yes  Can we respond through MyChart? Yes  Agent: Please be advised that Rx refills may take up to 3 business days. We ask that you follow-up with your pharmacy.

## 2024-07-24 ENCOUNTER — Telehealth: Payer: Self-pay

## 2024-07-24 NOTE — Telephone Encounter (Signed)
 Copied from CRM #[id number]. Topic: Clinical - Medication Question >> Jul 23, 2024  5:36 PM Sophia H wrote: Reason for CRM: Spoke with patients sister Stephane who states the patient has been without all of his medications for the last 2 days.  Needing refills on all normal medications, sister is not aware of which medications besides apixaban  (ELIQUIS ) 5 MG TABS tablet. States he is out of everything. Please advise # [phone]   [facility] - [place], [place] - [address]

## 2024-07-24 NOTE — Telephone Encounter (Signed)
 Requested Prescriptions  Pending Prescriptions Disp Refills   ELIQUIS  5 MG TABS tablet [Pharmacy Med Name: ELIQUIS  5 MG TAB] 180 tablet 0    Sig: TAKE 1 TABLET BY MOUTH TWICE DAILY     Hematology:  Anticoagulants - apixaban  Passed - 07/24/2024 10:44 AM      Passed - PLT in normal range and within 360 days    Platelets  Date Value Ref Range Status  03/21/2024 210 150 - 400 K/uL Final  05/02/2023 205 150 - 450 x10E3/uL Final         Passed - HGB in normal range and within 360 days    Hemoglobin  Date Value Ref Range Status  03/21/2024 13.0 13.0 - 17.0 g/dL Final  93/74/7975 86.1 13.0 - 17.7 g/dL Final         Passed - HCT in normal range and within 360 days    HCT  Date Value Ref Range Status  03/21/2024 39.6 39.0 - 52.0 % Final   Hematocrit  Date Value Ref Range Status  05/02/2023 40.9 37.5 - 51.0 % Final         Passed - Cr in normal range and within 360 days    Creatinine, Ser  Date Value Ref Range Status  03/21/2024 1.15 0.61 - 1.24 mg/dL Final         Passed - AST in normal range and within 360 days    AST  Date Value Ref Range Status  03/21/2024 20 15 - 41 U/L Final         Passed - ALT in normal range and within 360 days    ALT  Date Value Ref Range Status  03/21/2024 10 0 - 44 U/L Final         Passed - Valid encounter within last 12 months    Recent Outpatient Visits           7 months ago Viral upper respiratory tract infection   [place] [facility] Melvin Pao, NP

## 2024-07-25 ENCOUNTER — Other Ambulatory Visit: Payer: Self-pay

## 2024-07-25 NOTE — Telephone Encounter (Signed)
 Duplicate request, [facility] 07/24/24.  Requested Prescriptions  Pending Prescriptions Disp Refills   apixaban  (ELIQUIS ) 5 MG TABS tablet 180 tablet 0    Sig: Take 1 tablet (5 mg total) by mouth 2 (two) times daily.     Hematology:  Anticoagulants - apixaban  Passed - 07/25/2024  9:37 AM      Passed - PLT in normal range and within 360 days    Platelets  Date Value Ref Range Status  03/21/2024 210 150 - 400 K/uL Final  05/02/2023 205 150 - 450 x10E3/uL Final         Passed - HGB in normal range and within 360 days    Hemoglobin  Date Value Ref Range Status  03/21/2024 13.0 13.0 - 17.0 g/dL Final  93/74/7975 86.1 13.0 - 17.7 g/dL Final         Passed - HCT in normal range and within 360 days    HCT  Date Value Ref Range Status  03/21/2024 39.6 39.0 - 52.0 % Final   Hematocrit  Date Value Ref Range Status  05/02/2023 40.9 37.5 - 51.0 % Final         Passed - Cr in normal range and within 360 days    Creatinine, Ser  Date Value Ref Range Status  03/21/2024 1.15 0.61 - 1.24 mg/dL Final         Passed - AST in normal range and within 360 days    AST  Date Value Ref Range Status  03/21/2024 20 15 - 41 U/L Final         Passed - ALT in normal range and within 360 days    ALT  Date Value Ref Range Status  03/21/2024 10 0 - 44 U/L Final         Passed - Valid encounter within last 12 months    Recent Outpatient Visits           7 months ago Viral upper respiratory tract infection   [place] [facility] Melvin Pao, NP

## 2024-07-25 NOTE — Telephone Encounter (Signed)
 Request has been sent to DJ

## 2024-07-26 ENCOUNTER — Other Ambulatory Visit: Payer: Self-pay

## 2024-07-26 NOTE — Patient Outreach (Signed)
 Complex Care Management   Visit Note  07/26/2024  Name:  Ronnie Bullock MRN: [id number] DOB: 10-Sep-1946  Situation: Referral received for Complex Care Management related to Impaired Mobilty. I obtained verbal consent from Caregiver.  Visit completed with sister, Stephane Glatter  on the phone. Main concern: getting order for HHPT/OT - discussed last visit with PCP was in February, patient will need recent appointment with PCP so an order can be placed. Patient will also be seeing a general surgeon on 9/23 to discuss right inguinal hernia surgery.   Background:   Past Medical History:  Diagnosis Date   Dementia (HCC)    Dementia (HCC)    Hyperlipidemia    Hypertension    Incontinence     Assessment: Patient Reported Symptoms:  Cognitive Cognitive Status: Able to follow simple commands, Struggling with memory recall, Requires Assistance Decision Making      Neurological Neurological Review of Symptoms: No symptoms reported    HEENT HEENT Symptoms Reported: Not assessed      Cardiovascular Cardiovascular Symptoms Reported: Not assessed    Respiratory Respiratory Symptoms Reported: Not assesed    Endocrine Endocrine Symptoms Reported: Not assessed    Gastrointestinal Gastrointestinal Symptoms Reported: Not assessed      Genitourinary Genitourinary Symptoms Reported: Not assessed    Integumentary Integumentary Symptoms Reported: Not assessed    Musculoskeletal Musculoskelatal Symptoms Reviewed: Limited mobility, Unsteady gait, Other Other Musculoskeletal Symptoms: Right inguinal hernia Additional Musculoskeletal Details: Sister believes patient will benefit from physical therapy, he was not able to get HHPT after SNF due to SNF did not follow through on order, patient has not been able to attend post SNF follow up with PCP as of yet, sister will call and make appt today.        Psychosocial Psychosocial Symptoms Reported: Not assessed          07/26/2024    PHQ2-9  Depression Screening   Little interest or pleasure in doing things    Feeling down, depressed, or hopeless    PHQ-2 - Total Score    Trouble falling or staying asleep, or sleeping too much    Feeling tired or having little energy    Poor appetite or overeating     Feeling bad about yourself - or that you are a failure or have let yourself or your family down    Trouble concentrating on things, such as reading the newspaper or watching television    Moving or speaking so slowly that other people could have noticed.  Or the opposite - being so fidgety or restless that you have been moving around a lot more than usual    Thoughts that you would be better off dead, or hurting yourself in some way    PHQ2-9 Total Score    If you checked off any problems, how difficult have these problems made it for you to do your work, take care of things at home, or get along with other people    Depression Interventions/Treatment      There were no vitals filed for this visit.  Medications Reviewed Today   Medications were not reviewed in this encounter     Recommendation:   Specialty provider follow-up : General Surgeon 07/30/24 to discuss hernia Home Health requests: Occupational Therapy Physical therapy Sister, Stephane, will call PCP office today to make appointment to be assessed for HHPT/OT needs.   Follow Up Plan:   Telephone follow-up two weeks.  Santana Recruitment consultant, CCM [employer]  [facility] Population Health RN Care Manager Direct Dial: [phone]  Fax: [phone]

## 2024-07-26 NOTE — Patient Instructions (Signed)
 Visit Information  Thank you for taking time to visit with me today. Please don't hesitate to contact me if I can be of assistance to you before our next scheduled appointment.  Your next care management appointment is by telephone on Monday, October 6th at 11:00am.   Please call the care guide team at [phone] if you need to cancel, schedule, or reschedule an appointment.   A reminder to ALL patients/family/friends, please call the [place]  National Suicide Prevention Lifeline: 563 468 7741 or TTY: (984)764-0536 TTY 415-627-7391) to talk to a trained counselor if you are experiencing a Mental Health or Behavioral Health Crisis or need someone to talk to.  Santana Stamp BSN, CCM [place]  [facility]BC[facility] Population Health RN Care Manager Direct Dial: [phone]  Fax: [phone]

## 2024-07-29 MED ORDER — AMLODIPINE BESYLATE 5 MG PO TABS: 5.0000 mg | ORAL_TABLET | Freq: Every day | ORAL | 0 refills | Status: DC

## 2024-07-29 MED ORDER — APIXABAN 5 MG PO TABS: 5.0000 mg | ORAL_TABLET | Freq: Two times a day (BID) | ORAL | 0 refills | Status: DC

## 2024-07-29 MED ORDER — TAMSULOSIN HCL 0.4 MG PO CAPS: 0.8000 mg | ORAL_CAPSULE | Freq: Every day | ORAL | 0 refills | Status: DC

## 2024-07-29 MED ORDER — DONEPEZIL HCL 5 MG PO TABS: 5.0000 mg | ORAL_TABLET | Freq: Every day | ORAL | 0 refills | Status: DC

## 2024-07-30 ENCOUNTER — Encounter: Payer: Self-pay | Admitting: General Surgery

## 2024-07-30 ENCOUNTER — Ambulatory Visit: Payer: Self-pay | Admitting: General Surgery

## 2024-07-30 ENCOUNTER — Telehealth: Payer: Self-pay | Admitting: General Surgery

## 2024-07-30 ENCOUNTER — Ambulatory Visit: Admitting: General Surgery

## 2024-07-30 VITALS — BP 150/82 | HR 94 | Ht 69.0 in | Wt 156.0 lb

## 2024-07-30 DIAGNOSIS — K4090 Unilateral inguinal hernia, without obstruction or gangrene, not specified as recurrent: Secondary | ICD-10-CM | POA: Diagnosis not present

## 2024-07-30 NOTE — Progress Notes (Signed)
 Patient ID: Ronnie Bullock, male   DOB: 11-Mar-1946, 78 y.o.   MRN: [id number] CC: Right Inguinal Hernia History of Present Illness Ronnie Bullock is a 78 y.o. male with past medical history as below who presents in consultation for right inguinal hernia.  The patient is accompanied by his pastor and his sisters on the phone who corroborates some of the information.  The patient reports that he noticed a bulge in his right groin about a year ago.  He says that it is quite large although it rarely causes him any pain.  He denies any overlying skin changes and has been tolerating a regular diet without any changes in bowel habits.  He says that he does not get smaller when he lays down.  He does have history of prostate cancer in was seen by urology.  At that time they noticed a right groin bulge and got a CT scan that showed a right inguinal hernia containing small bowel loops.  I reviewed this personally.  Past Medical History Past Medical History:  Diagnosis Date   Dementia (HCC)    Dementia (HCC)    Hyperlipidemia    Hypertension    Incontinence        Past Surgical History:  Procedure Laterality Date   COLONOSCOPY WITH PROPOFOL  N/A 05/16/2018   Procedure: COLONOSCOPY WITH PROPOFOL ;  Surgeon: Janalyn Keene NOVAK, MD;  Location: [facility] ENDOSCOPY;  Service: Endoscopy;  Laterality: N/A;   PERIPHERAL VASCULAR THROMBECTOMY Left 01/22/2021   Procedure: PERIPHERAL VASCULAR THROMBECTOMY / THROMBOLYSIS;  Surgeon: Jama Cordella MATSU, MD;  Location: [facility] INVASIVE CV LAB;  Service: Cardiovascular;  Laterality: Left;    No Known Allergies  Current Outpatient Medications  Medication Sig Dispense Refill   amLODipine  (NORVASC ) 5 MG tablet Take 1 tablet (5 mg total) by mouth daily. 30 tablet 0   apixaban  (ELIQUIS ) 5 MG TABS tablet Take 1 tablet (5 mg total) by mouth 2 (two) times daily. 60 tablet 0   cetirizine  (ZYRTEC ) 10 MG tablet Take 1 tablet (10 mg total) by mouth daily. 30 tablet 11   donepezil   (ARICEPT ) 5 MG tablet Take 1 tablet (5 mg total) by mouth at bedtime. 30 tablet 0   Multiple Vitamin (MULTIVITAMIN WITH MINERALS) TABS tablet Take 1 tablet by mouth daily. 90 tablet 0   mupirocin  cream (BACTROBAN ) 2 % Apply topically 2 (two) times daily. 15 g 0   tamsulosin  (FLOMAX ) 0.4 MG CAPS capsule Take 2 capsules (0.8 mg total) by mouth daily. 60 capsule 0   triamcinolone  cream (KENALOG ) 0.1 % Apply 1 Application topically 2 (two) times daily. 30 g 0   No current facility-administered medications for this visit.    Family History Family History  Problem Relation Age of Onset   Hypertension Mother    Prostate cancer Neg Hx    Bladder Cancer Neg Hx    Kidney cancer Neg Hx        Social History Social History   Tobacco Use   Smoking status: Never    Passive exposure: Never   Smokeless tobacco: Never  Vaping Use   Vaping status: Never Used  Substance Use Topics   Alcohol use: No    Alcohol/week: 0.0 standard drinks of alcohol   Drug use: No        ROS Full ROS of systems performed and is otherwise negative there than what is stated in the HPI  Physical Exam Blood pressure (!) 150/82, pulse 94, height 5' 9 (1.753 m), weight  156 lb (70.8 kg), SpO2 99%.  Responds appropriately to questions but some information corroborated by his pastor and sister on the phone, normal work of breathing on room air, walks with a walker but is able to stand and transfer to exam table by himself, normal work of breathing on room air, regular rate and rhythm, abdomen is soft, scaphoid, right groin with obvious bulge upon standing but when he lays down I am able to easily reduce this, no bulge in his left groin.  Data Reviewed CT scan personally reviewed and there is a inguinoscrotal hernia on the right side containing small bowel loops.  I have personally reviewed the patient's imaging and medical records.    Assessment/Plan    78 year old male who is on Eliquis  for left lower extremity  DVT who presents in consultation for right inguinal scrotal hernia.  I discussed with him that I would recommend repair given the size of the hernia.  We discussed the risk, benefits alternatives to procedure including risk of infection, bleeding, damage to the vas deferens, testicular ischemia, chronic pain as well as recurrence.  We will use mesh with this.  We will plan to stop his Eliquis  on Tuesday of next week and proceed with surgery on October 3.    Ronnie Bullock 07/30/2024, 10:04 AM

## 2024-07-30 NOTE — Patient Instructions (Signed)
 You need to stop your Eliquis  2 full days before your surgery. Your last dose will be on September 30th.  You have chose to have your hernia repaired. This will be done by Dr. Marinda at [facility].  Please see your (blue) Pre-care information that you have been given today. Our surgery scheduler will call you to verify surgery date and to go over information.   You will need to arrange to be out of work for approximately 1-2 weeks and then you may return with a lifting restriction for 4 more weeks. If you have FMLA or Disability paperwork that needs to be filled out, please have your company fax your paperwork to [phone] or you may drop this by either office. This paperwork will be filled out within 3 days after your surgery has been completed.  You may have a bruise in your groin and also swelling and brusing in your testicle area. You may use ice 4-5 times daily for 15-20 minutes each time. Make sure that you place a barrier between you and the ice pack. To decrease the swelling, you may roll up a bath towel and place it vertically in between your thighs with your testicles resting on the towel. You will want to keep this area elevated as much as possible for several days following surgery.    Inguinal Hernia, Adult Muscles help keep everything in the body in its proper place. But if a weak spot in the muscles develops, something can poke through. That is called a hernia. When this happens in the lower part of the belly (abdomen), it is called an inguinal hernia. (It takes its name from a part of the body in this region called the inguinal canal.) A weak spot in the wall of muscles lets some fat or part of the small intestine bulge through. An inguinal hernia can develop at any age. Men get them more often than women. CAUSES  In adults, an inguinal hernia develops over time. It can be triggered by: Suddenly straining the muscles of the lower abdomen. Lifting heavy objects. Straining to  have a bowel movement. Difficult bowel movements (constipation) can lead to this. Constant coughing. This may be caused by smoking or lung disease. Being overweight. Being pregnant. Working at a job that requires long periods of standing or heavy lifting. Having had an inguinal hernia before. One type can be an emergency situation. It is called a strangulated inguinal hernia. It develops if part of the small intestine slips through the weak spot and cannot get back into the abdomen. The blood supply can be cut off. If that happens, part of the intestine may die. This situation requires emergency surgery. SYMPTOMS  Often, a small inguinal hernia has no symptoms. It is found when a healthcare provider does a physical exam. Larger hernias usually have symptoms.  In adults, symptoms may include: A lump in the groin. This is easier to see when the person is standing. It might disappear when lying down. In men, a lump in the scrotum. Pain or burning in the groin. This occurs especially when lifting, straining or coughing. A dull ache or feeling of pressure in the groin. Signs of a strangulated hernia can include: A bulge in the groin that becomes very painful and tender to the touch. A bulge that turns red or purple. Fever, nausea and vomiting. Inability to have a bowel movement or to pass gas. DIAGNOSIS  To decide if you have an inguinal hernia, a healthcare provider  will probably do a physical examination. This will include asking questions about any symptoms you have noticed. The healthcare provider might feel the groin area and ask you to cough. If an inguinal hernia is felt, the healthcare provider may try to slide it back into the abdomen. Usually no other tests are needed. TREATMENT  Treatments can vary. The size of the hernia makes a difference. Options include: Watchful waiting. This is often suggested if the hernia is small and you have had no symptoms. No medical procedure will be  done unless symptoms develop. You will need to watch closely for symptoms. If any occur, contact your healthcare provider right away. Surgery. This is used if the hernia is larger or you have symptoms. Open surgery. This is usually an outpatient procedure (you will not stay overnight in a hospital). An cut (incision) is made through the skin in the groin. The hernia is put back inside the abdomen. The weak area in the muscles is then repaired by herniorrhaphy or hernioplasty. Herniorrhaphy: in this type of surgery, the weak muscles are sewn back together. Hernioplasty: a patch or mesh is used to close the weak area in the abdominal wall. Laparoscopy. In this procedure, a surgeon makes small incisions. A thin tube with a tiny video camera (called a laparoscope) is put into the abdomen. The surgeon repairs the hernia with mesh by looking with the video camera and using two long instruments. HOME CARE INSTRUCTIONS  After surgery to repair an inguinal hernia: You will need to take pain medicine prescribed by your healthcare provider. Follow all directions carefully. You will need to take care of the wound from the incision. Your activity will be restricted for awhile. This will probably include no heavy lifting for several weeks. You also should not do anything too active for a few weeks. When you can return to work will depend on the type of job that you have. During watchful waiting periods, you should: Maintain a healthy weight. Eat a diet high in fiber (fruits, vegetables and whole grains). Drink plenty of fluids to avoid constipation. This means drinking enough water and other liquids to keep your urine clear or pale yellow. Do not lift heavy objects. Do not stand for long periods of time. Quit smoking. This should keep you from developing a frequent cough. SEEK MEDICAL CARE IF:  A bulge develops in your groin area. You feel pain, a burning sensation or pressure in the groin. This might be  worse if you are lifting or straining. You develop a fever of more than 100.5 F (38.1 C). SEEK IMMEDIATE MEDICAL CARE IF:  Pain in the groin increases suddenly. A bulge in the groin gets bigger suddenly and does not go down. For men, there is sudden pain in the scrotum. Or, the size of the scrotum increases. A bulge in the groin area becomes red or purple and is painful to touch. You have nausea or vomiting that does not go away. You feel your heart beating much faster than normal. You cannot have a bowel movement or pass gas. You develop a fever of more than 102.0 F (38.9 C).   This information is not intended to replace advice given to you by your health care provider. Make sure you discuss any questions you have with your health care provider.   Document Released: 03/12/2009 Document Revised: 01/16/2012 Document Reviewed: 04/27/2015 Elsevier Interactive Patient Education [employer]

## 2024-07-30 NOTE — Telephone Encounter (Signed)
 Patient has been advised of Pre-Admission date/time, and Surgery date at [facility].  Surgery Date: 08/09/24 Preadmission Testing Date: 08/02/24 (phone 1p-4p)  Patient's family informed of the scheduling process and surgery information given at time of office visit.   Patient has been made aware to call [phone], between 1-3:00pm the day before surgery, to find out what time to arrive for surgery.

## 2024-08-02 ENCOUNTER — Other Ambulatory Visit: Payer: Self-pay

## 2024-08-02 ENCOUNTER — Telehealth: Payer: Self-pay | Admitting: Family Medicine

## 2024-08-02 ENCOUNTER — Encounter
Admission: RE | Admit: 2024-08-02 | Discharge: 2024-08-02 | Disposition: A | Discharge status: Home / Self Care | Source: Ambulatory Visit | Attending: General Surgery | Admitting: General Surgery

## 2024-08-02 VITALS — Ht 69.0 in | Wt 155.0 lb

## 2024-08-02 DIAGNOSIS — I10 Essential (primary) hypertension: Secondary | ICD-10-CM

## 2024-08-02 DIAGNOSIS — I25118 Atherosclerotic heart disease of native coronary artery with other forms of angina pectoris: Secondary | ICD-10-CM

## 2024-08-02 DIAGNOSIS — Z01812 Encounter for preprocedural laboratory examination: Secondary | ICD-10-CM

## 2024-08-02 HISTORY — DX: Malignant (primary) neoplasm, unspecified: C80.1

## 2024-08-02 HISTORY — DX: Benign neoplasm of cecum: D12.0

## 2024-08-02 HISTORY — DX: Atherosclerotic heart disease of native coronary artery without angina pectoris: I25.10

## 2024-08-02 HISTORY — DX: Benign neoplasm of ascending colon: D12.2

## 2024-08-02 NOTE — Patient Instructions (Addendum)
 Your procedure is scheduled on:  FRIDAY   OCTOBER 3  Report to the Registration Desk on the 1st floor of the [facility] To find out your arrival time, please call [phone] between 1PM - 3PM on:   THURSDAY  OCTOBER 2  If your arrival time is 6:00 am, do not arrive before that time as the [facility] entrance doors do not open until 6:00 am.  REMEMBER: Instructions that are not followed completely may result in serious medical risk, up to and including death; or upon the discretion of your surgeon and anesthesiologist your surgery may need to be rescheduled.  Do not eat food after midnight the night before surgery.  No gum chewing or hard candies.   One week prior to surgery: Stop Anti-inflammatories (NSAIDS) such as Advil, Aleve, Ibuprofen, Motrin, Naproxen, Naprosyn and Aspirin based products such as Excedrin, Goody's Powder, BC Powder. Stop ANY OVER THE COUNTER supplements until after surgery. cetirizine  (ZYRTEC )  fluticasone  (FLONASE )  Multiple Vitamin (MULTIVITAMIN WITH MINERALS)   You may however, continue to take Tylenol  if needed for pain up until the day of surgery.  **Follow recommendations regarding stopping blood thinners.** apixaban  (ELIQUIS ) hold 2 full days prior to surgery, last dose TUESDAY SEPTEMBER 30   Continue taking all of your other prescription medications up until the day of surgery.  ON THE DAY OF SURGERY DO NOT TAKE ANY MEDICATIONS   No Alcohol for 24 hours before or after surgery.  Do not use any recreational drugs for at least a week (preferably 2 weeks) before your surgery.  Please be advised that the combination of cocaine and anesthesia may have negative outcomes, up to and including death. If you test positive for cocaine, your surgery will be cancelled.  On the morning of surgery brush your teeth with toothpaste and water, you may rinse your mouth with mouthwash if you wish. Do not swallow any toothpaste or mouthwash.  Use CHG Soap as  directed on instruction sheet.  Do not wear jewelry, make-up, hairpins, clips or nail polish.  For welded (permanent) jewelry: bracelets, anklets, waist bands, etc.  Please have this removed prior to surgery.  If it is not removed, there is a chance that hospital personnel will need to cut it off on the day of surgery.  Do not wear lotions, powders, or perfumes.   Do not shave body hair from the neck down 48 hours before surgery.  Contact lenses, hearing aids and dentures may not be worn into surgery.  Do not bring valuables to the hospital. [facility] is not responsible for any missing/lost belongings or valuables.   Notify your doctor if there is any change in your medical condition (cold, fever, infection).  Wear comfortable clothing (specific to your surgery type) to the hospital.  After surgery, you can help prevent lung complications by doing breathing exercises.  Take deep breaths and cough every 1-2 hours.   If you are being discharged the day of surgery, you will not be allowed to drive home. You will need a responsible individual to drive you home and stay with you for 24 hours after surgery.   If you are taking public transportation, you will need to have a responsible individual with you.  Please call the Pre-admissions Testing Dept. at [phone] if you have any questions about these instructions.  Surgery Visitation Policy:  Patients having surgery or a procedure may have two visitors.  Children under the age of 64 must have an adult  with them who is not the patient.  Merchandiser, retail to address health-related social needs:  [email or web address]                                                                                                              Preparing for Surgery with CHLORHEXIDINE GLUCONATE (CHG) Soap  Chlorhexidine Gluconate (CHG) Soap  o An antiseptic cleaner that kills germs and bonds with the skin to continue  killing germs even after washing  o Used for showering the night before surgery and morning of surgery  Before surgery, you can play an important role by reducing the number of germs on your skin.  CHG (Chlorhexidine gluconate) soap is an antiseptic cleanser which kills germs and bonds with the skin to continue killing germs even after washing.  Please do not use if you have an allergy to CHG or antibacterial soaps. If your skin becomes reddened/irritated stop using the CHG.  1. Shower the NIGHT BEFORE SURGERY and the MORNING OF SURGERY with CHG soap.  2. If you choose to wash your hair, wash your hair first as usual with your normal shampoo.  3. After shampooing, rinse your hair and body thoroughly to remove the shampoo.  4. Use CHG as you would any other liquid soap. You can apply CHG directly to the skin and wash gently with a scrungie or a clean washcloth.  5. Apply the CHG soap to your body only from the neck down. Do not use on open wounds or open sores. Avoid contact with your eyes, ears, mouth, and genitals (private parts). Wash face and genitals (private parts) with your normal soap.  6. Wash thoroughly, paying special attention to the area where your surgery will be performed.  7. Thoroughly rinse your body with warm water.  8. Do not shower/wash with your normal soap after using and rinsing off the CHG soap.  9. Pat yourself dry with a clean towel.  10. Wear clean pajamas to bed the night before surgery.  11. Place clean sheets on your bed the night of your first shower and do not sleep with pets.  12. Shower again with the CHG soap on the day of surgery prior to arriving at the hospital.  13. Do not apply any deodorants/lotions/powders.  14. Please wear clean clothes to the hospital.

## 2024-08-08 NOTE — Anesthesia Preprocedure Evaluation (Signed)
 Anesthesia Evaluation  Patient identified by MRN, date of birth, ID band Patient awake    Reviewed: Allergy & Precautions, NPO status , Patient's Chart, lab work & pertinent test results  History of Anesthesia Complications Negative for: history of anesthetic complications  Airway Mallampati: I   Neck ROM: Full    Dental  (+) Edentulous Upper, Edentulous Lower   Pulmonary neg pulmonary ROS   Pulmonary exam normal breath sounds clear to auscultation       Cardiovascular hypertension, + CAD  Normal cardiovascular exam Rhythm:Regular Rate:Normal  Hx DVT on Eliquis    ECG 03/21/24: SR; PACs; no STEMI   Neuro/Psych  PSYCHIATRIC DISORDERS     Dementia 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; residual right-sided weakness; uses walker)    GI/Hepatic negative GI ROS,,,  Endo/Other  negative endocrine ROS    Renal/GU negative Renal ROS   Prostate CA    Musculoskeletal   Abdominal   Peds  Hematology negative hematology ROS (+)   Anesthesia Other Findings   Reproductive/Obstetrics                              Anesthesia Physical Anesthesia Plan  ASA: 3  Anesthesia Plan: General   Post-op Pain Management:    Induction: Intravenous  PONV Risk Score and Plan: 2 and Ondansetron , Dexamethasone and Treatment may vary due to age or medical condition  Airway Management Planned: LMA  Additional Equipment:   Intra-op Plan:   Post-operative Plan: Extubation in OR  Informed Consent: I have reviewed the patients History and Physical, chart, labs and discussed the procedure including the risks, benefits and alternatives for the proposed anesthesia with the patient or authorized representative who has indicated his/her understanding and acceptance.     Dental advisory given and Consent reviewed with POA  Plan Discussed with: CRNA  Anesthesia Plan Comments: (Patient and sister Dawn, at bedside, consented for risks of  anesthesia including but not limited to:  - adverse reactions to medications - damage to eyes, teeth, lips or other oral mucosa - nerve damage due to positioning  - sore throat or hoarseness - damage to heart, brain, nerves, lungs, other parts of body or loss of life  Informed patient and sister about role of CRNA in peri- and intra-operative care; they voiced understanding.)         Anesthesia Quick Evaluation

## 2024-08-09 ENCOUNTER — Other Ambulatory Visit: Payer: Self-pay

## 2024-08-09 ENCOUNTER — Ambulatory Visit
Admission: RE | Admit: 2024-08-09 | Discharge: 2024-08-09 | Disposition: A | Discharge status: Home / Self Care | Attending: General Surgery | Admitting: General Surgery

## 2024-08-09 ENCOUNTER — Ambulatory Visit: Payer: Self-pay | Admitting: Anesthesiology

## 2024-08-09 ENCOUNTER — Ambulatory Visit: Admitting: Pediatrics

## 2024-08-09 ENCOUNTER — Encounter
Admission: RE | Disposition: A | Discharge status: Home / Self Care | Payer: Self-pay | Source: Home / Self Care | Attending: General Surgery

## 2024-08-09 ENCOUNTER — Encounter: Payer: Self-pay | Admitting: General Surgery

## 2024-08-09 DIAGNOSIS — F0390 Unspecified dementia without behavioral disturbance: Secondary | ICD-10-CM | POA: Diagnosis not present

## 2024-08-09 DIAGNOSIS — Z01812 Encounter for preprocedural laboratory examination: Secondary | ICD-10-CM

## 2024-08-09 DIAGNOSIS — I2510 Atherosclerotic heart disease of native coronary artery without angina pectoris: Secondary | ICD-10-CM | POA: Diagnosis not present

## 2024-08-09 DIAGNOSIS — K4090 Unilateral inguinal hernia, without obstruction or gangrene, not specified as recurrent: Secondary | ICD-10-CM | POA: Diagnosis not present

## 2024-08-09 DIAGNOSIS — I25118 Atherosclerotic heart disease of native coronary artery with other forms of angina pectoris: Secondary | ICD-10-CM

## 2024-08-09 DIAGNOSIS — Z7901 Long term (current) use of anticoagulants: Secondary | ICD-10-CM | POA: Diagnosis not present

## 2024-08-09 DIAGNOSIS — I69351 Hemiplegia and hemiparesis following cerebral infarction affecting right dominant side: Secondary | ICD-10-CM | POA: Diagnosis not present

## 2024-08-09 DIAGNOSIS — I10 Essential (primary) hypertension: Secondary | ICD-10-CM | POA: Diagnosis not present

## 2024-08-09 DIAGNOSIS — Z86718 Personal history of other venous thrombosis and embolism: Secondary | ICD-10-CM | POA: Diagnosis not present

## 2024-08-09 DIAGNOSIS — Z8546 Personal history of malignant neoplasm of prostate: Secondary | ICD-10-CM | POA: Diagnosis not present

## 2024-08-09 HISTORY — PX: INSERTION OF MESH: SHX5868

## 2024-08-09 HISTORY — PX: INGUINAL HERNIA REPAIR: SHX194

## 2024-08-09 LAB — BASIC METABOLIC PANEL WITH GFR
Anion gap: 8 (ref 5–15)
BUN: 19 mg/dL (ref 8–23)
CO2: 24 mmol/L (ref 22–32)
Calcium: 9 mg/dL (ref 8.9–10.3)
Chloride: 105 mmol/L (ref 98–111)
Creatinine, Ser: 1.4 mg/dL — ABNORMAL HIGH (ref 0.61–1.24)
GFR, Estimated: 51 mL/min — ABNORMAL LOW (ref >60)
Glucose, Bld: 98 mg/dL (ref 70–99)
Potassium: 4.1 mmol/L (ref 3.5–5.1)
Sodium: 137 mmol/L (ref 135–145)

## 2024-08-09 LAB — CBC
HCT: 43.1 % (ref 39.0–52.0)
Hemoglobin: 14.2 g/dL (ref 13.0–17.0)
MCH: 29.3 pg (ref 26.0–34.0)
MCHC: 32.9 g/dL (ref 30.0–36.0)
MCV: 88.9 fL (ref 80.0–100.0)
Platelets: 221 K/uL (ref 150–400)
RBC: 4.85 MIL/uL (ref 4.22–5.81)
RDW: 14.5 % (ref 11.5–15.5)
WBC: 4.8 K/uL (ref 4.0–10.5)
nRBC: 0 % (ref 0.0–0.2)

## 2024-08-09 SURGERY — REPAIR, HERNIA, INGUINAL, ADULT
Anesthesia: General | Site: Inguinal | Laterality: Right

## 2024-08-09 MED ORDER — OXYCODONE HCL 5 MG PO TABS: 5.0000 mg | ORAL_TABLET | Freq: Once | ORAL | Status: DC | PRN

## 2024-08-09 MED ORDER — BUPIVACAINE-EPINEPHRINE (PF) 0.5% -1:200000 IJ SOLN
INTRAMUSCULAR | Status: DC | PRN
  Administered 2024-08-09 (×2): 10 mL

## 2024-08-09 MED ORDER — OXYCODONE HCL 5 MG/5ML PO SOLN: 5.0000 mg | Freq: Once | ORAL | Status: DC | PRN

## 2024-08-09 MED ORDER — CEFAZOLIN SODIUM-DEXTROSE 2-4 GM/100ML-% IV SOLN
INTRAVENOUS | Status: AC
  Filled 2024-08-09: qty 100

## 2024-08-09 MED ORDER — LACTATED RINGERS IV SOLN: INTRAVENOUS | Status: DC

## 2024-08-09 MED ORDER — FENTANYL CITRATE (PF) 100 MCG/2ML IJ SOLN
INTRAMUSCULAR | Status: DC | PRN
  Administered 2024-08-09 (×2): 50 ug via INTRAVENOUS

## 2024-08-09 MED ORDER — PHENYLEPHRINE 80 MCG/ML (10ML) SYRINGE FOR IV PUSH (FOR BLOOD PRESSURE SUPPORT)
PREFILLED_SYRINGE | INTRAVENOUS | Status: DC | PRN
  Administered 2024-08-09: 80 ug via INTRAVENOUS
  Administered 2024-08-09: 160 ug via INTRAVENOUS
  Administered 2024-08-09 (×6): 80 ug via INTRAVENOUS

## 2024-08-09 MED ORDER — 0.9 % SODIUM CHLORIDE (POUR BTL) OPTIME
TOPICAL | Status: DC | PRN
  Administered 2024-08-09: 500 mL

## 2024-08-09 MED ORDER — ORAL CARE MOUTH RINSE: 15.0000 mL | Freq: Once | OROMUCOSAL | Status: AC

## 2024-08-09 MED ORDER — CEFAZOLIN SODIUM-DEXTROSE 2-4 GM/100ML-% IV SOLN
2.0000 g | INTRAVENOUS | Status: AC
  Administered 2024-08-09: 2 g via INTRAVENOUS

## 2024-08-09 MED ORDER — BUPIVACAINE-EPINEPHRINE (PF) 0.5% -1:200000 IJ SOLN
INTRAMUSCULAR | Status: AC
Start: 2024-08-09 — End: 2024-08-09
  Filled 2024-08-09: qty 30

## 2024-08-09 MED ORDER — PROPOFOL 10 MG/ML IV BOLUS
INTRAVENOUS | Status: AC
Start: 2024-08-09 — End: 2024-08-09
  Filled 2024-08-09: qty 20

## 2024-08-09 MED ORDER — ONDANSETRON HCL 4 MG/2ML IJ SOLN
INTRAMUSCULAR | Status: DC | PRN
  Administered 2024-08-09: 4 mg via INTRAVENOUS

## 2024-08-09 MED ORDER — EPHEDRINE SULFATE-NACL 50-0.9 MG/10ML-% IV SOSY
PREFILLED_SYRINGE | INTRAVENOUS | Status: DC | PRN
  Administered 2024-08-09: 2.5 mg via INTRAVENOUS
  Administered 2024-08-09 (×2): 5 mg via INTRAVENOUS

## 2024-08-09 MED ORDER — CHLORHEXIDINE GLUCONATE 0.12 % MT SOLN
OROMUCOSAL | Status: AC
  Filled 2024-08-09: qty 15

## 2024-08-09 MED ORDER — APIXABAN 5 MG PO TABS: 5.0000 mg | ORAL_TABLET | Freq: Two times a day (BID) | ORAL | Status: DC

## 2024-08-09 MED ORDER — LIDOCAINE HCL (CARDIAC) PF 100 MG/5ML IV SOSY
PREFILLED_SYRINGE | INTRAVENOUS | Status: DC | PRN
  Administered 2024-08-09: 60 mg via INTRAVENOUS

## 2024-08-09 MED ORDER — CHLORHEXIDINE GLUCONATE 0.12 % MT SOLN
15.0000 mL | Freq: Once | OROMUCOSAL | Status: AC
  Administered 2024-08-09: 15 mL via OROMUCOSAL

## 2024-08-09 MED ORDER — METOPROLOL TARTRATE 5 MG/5ML IV SOLN
1.0000 mg | Freq: Once | INTRAVENOUS | Status: AC
  Administered 2024-08-09: 1 mg via INTRAVENOUS

## 2024-08-09 MED ORDER — PROPOFOL 10 MG/ML IV BOLUS
INTRAVENOUS | Status: DC | PRN
  Administered 2024-08-09: 150 mg via INTRAVENOUS

## 2024-08-09 MED ORDER — DEXAMETHASONE SODIUM PHOSPHATE 10 MG/ML IJ SOLN
INTRAMUSCULAR | Status: DC | PRN
  Administered 2024-08-09: 5 mg via INTRAVENOUS

## 2024-08-09 MED ORDER — ONDANSETRON HCL 4 MG/2ML IJ SOLN: 4.0000 mg | Freq: Once | INTRAMUSCULAR | Status: DC | PRN

## 2024-08-09 MED ORDER — ACETAMINOPHEN 10 MG/ML IV SOLN
INTRAVENOUS | Status: DC | PRN
  Administered 2024-08-09: 1000 mg via INTRAVENOUS

## 2024-08-09 MED ORDER — HYDRALAZINE HCL 20 MG/ML IJ SOLN
INTRAMUSCULAR | Status: AC
Start: 2024-08-09 — End: 2024-08-09
  Filled 2024-08-09: qty 1

## 2024-08-09 MED ORDER — BUPIVACAINE LIPOSOME 1.3 % IJ SUSP
INTRAMUSCULAR | Status: AC
Start: 2024-08-09 — End: 2024-08-09
  Filled 2024-08-09: qty 10

## 2024-08-09 MED ORDER — FENTANYL CITRATE (PF) 100 MCG/2ML IJ SOLN
INTRAMUSCULAR | Status: AC
  Filled 2024-08-09: qty 2

## 2024-08-09 MED ORDER — CHLORHEXIDINE GLUCONATE CLOTH 2 % EX PADS: 6.0000 | MEDICATED_PAD | Freq: Once | CUTANEOUS | Status: DC

## 2024-08-09 MED ORDER — CHLORHEXIDINE GLUCONATE CLOTH 2 % EX PADS
6.0000 | MEDICATED_PAD | Freq: Once | CUTANEOUS | Status: AC
  Administered 2024-08-09: 6 via TOPICAL

## 2024-08-09 MED ORDER — OXYCODONE HCL 5 MG PO TABS: 5.0000 mg | ORAL_TABLET | Freq: Four times a day (QID) | ORAL | 0 refills | Status: DC | PRN

## 2024-08-09 MED ORDER — FENTANYL CITRATE (PF) 100 MCG/2ML IJ SOLN: 25.0000 ug | INTRAMUSCULAR | Status: DC | PRN

## 2024-08-09 MED ORDER — HYDRALAZINE HCL 20 MG/ML IJ SOLN
10.0000 mg | Freq: Once | INTRAMUSCULAR | Status: AC
  Administered 2024-08-09: 10 mg via INTRAVENOUS

## 2024-08-09 MED ORDER — ACETAMINOPHEN 10 MG/ML IV SOLN: 1000.0000 mg | Freq: Once | INTRAVENOUS | Status: DC | PRN

## 2024-08-09 MED ORDER — PHENYLEPHRINE HCL-NACL 20-0.9 MG/250ML-% IV SOLN
INTRAVENOUS | Status: DC | PRN
  Administered 2024-08-09: 20 ug/min via INTRAVENOUS

## 2024-08-09 SURGICAL SUPPLY — 28 items
BLADE CLIPPER SURG (BLADE) ×1 IMPLANT
BLADE SURG 15 STRL LF DISP TIS (BLADE) ×1 IMPLANT
BRUSH SCRUB EZ 4% CHG (MISCELLANEOUS) ×1 IMPLANT
CHLORAPREP W/TINT 26 (MISCELLANEOUS) IMPLANT
DERMABOND ADVANCED .7 DNX12 (GAUZE/BANDAGES/DRESSINGS) ×1 IMPLANT
DRAIN PENROSE 12X.25 LTX STRL (MISCELLANEOUS) ×1 IMPLANT
DRAPE LAPAROTOMY 100X77 ABD (DRAPES) ×1 IMPLANT
ELECTRODE REM PT RTRN 9FT ADLT (ELECTROSURGICAL) ×1 IMPLANT
GAUZE 4X4 16PLY ~~LOC~~+RFID DBL (SPONGE) IMPLANT
GLOVE BIOGEL PI IND STRL 7.5 (GLOVE) ×1 IMPLANT
GLOVE SURG SYN 7.0 PF PI (GLOVE) ×1 IMPLANT
GOWN STRL REUS W/ TWL LRG LVL3 (GOWN DISPOSABLE) ×2 IMPLANT
LABEL OR SOLS (LABEL) ×1 IMPLANT
MANIFOLD NEPTUNE II (INSTRUMENTS) ×1 IMPLANT
MESH HERNIA 6X13 (Mesh General) IMPLANT
NDL HYPO 22X1.5 SAFETY MO (MISCELLANEOUS) ×1 IMPLANT
NEEDLE HYPO 22X1.5 SAFETY MO (MISCELLANEOUS) ×1 IMPLANT
NS IRRIG 500ML POUR BTL (IV SOLUTION) ×1 IMPLANT
PACK BASIN MINOR ARMC (MISCELLANEOUS) ×1 IMPLANT
SUT ETHIBOND 0 MO6 C/R (SUTURE) IMPLANT
SUT PROLENE 2 0 SH DA (SUTURE) ×3 IMPLANT
SUT SILK 3-0 18XBRD TIE 12 (SUTURE) ×1 IMPLANT
SUT VIC AB 2-0 SH 27XBRD (SUTURE) ×1 IMPLANT
SUT VIC AB 3-0 SH 27X BRD (SUTURE) ×2 IMPLANT
SUTURE MNCRL 4-0 27XMF (SUTURE) ×1 IMPLANT
SYR 10ML LL (SYRINGE) ×1 IMPLANT
TRAP FLUID SMOKE EVACUATOR (MISCELLANEOUS) ×1 IMPLANT
WATER STERILE IRR 500ML POUR (IV SOLUTION) ×1 IMPLANT

## 2024-08-09 NOTE — Anesthesia Procedure Notes (Signed)
 Procedure Name: LMA Insertion Date/Time: 08/09/2024 8:16 AM  Performed by: Germaine Maeola CROME, CRNAPre-anesthesia Checklist: Patient identified, Emergency Drugs available, Suction available and Patient being monitored Patient Re-evaluated:Patient Re-evaluated prior to induction Oxygen Delivery Method: Circle system utilized Preoxygenation: Pre-oxygenation with 100% oxygen Induction Type: IV induction Ventilation: Mask ventilation without difficulty LMA: LMA inserted LMA Size: 4.0 Tube type: Oral Number of attempts: 1 Placement Confirmation: positive ETCO2 and breath sounds checked- equal and bilateral Tube secured with: Tape Dental Injury: Teeth and Oropharynx as per pre-operative assessment

## 2024-08-09 NOTE — H&P (Signed)
 Patient has been off Eliquis  for 2 days.  He is hypertensive in the preoperative area.  We will try to treat this and if it gets under control can proceed with surgery.  Otherwise no changes to below H&P  CC: Right Inguinal Hernia History of Present Illness Ronnie Bullock is a 78 y.o. male with past medical history as below who presents in consultation for right inguinal hernia.  The patient is accompanied by his pastor and his sisters on the phone who corroborates some of the information.  The patient reports that he noticed a bulge in his right groin about a year ago.  He says that it is quite large although it rarely causes him any pain.  He denies any overlying skin changes and has been tolerating a regular diet without any changes in bowel habits.  He says that he does not get smaller when he lays down.   He does have history of prostate cancer in was seen by urology.  At that time they noticed a right groin bulge and got a CT scan that showed a right inguinal hernia containing small bowel loops.  I reviewed this personally.   Past Medical History     Past Medical History:  Diagnosis Date   Dementia (HCC)     Dementia (HCC)     Hyperlipidemia     Hypertension     Incontinence                   Past Surgical History:  Procedure Laterality Date   COLONOSCOPY WITH PROPOFOL  N/A 05/16/2018    Procedure: COLONOSCOPY WITH PROPOFOL ;  Surgeon: Janalyn Keene NOVAK, MD;  Location: [facility] ENDOSCOPY;  Service: Endoscopy;  Laterality: N/A;   PERIPHERAL VASCULAR THROMBECTOMY Left 01/22/2021    Procedure: PERIPHERAL VASCULAR THROMBECTOMY / THROMBOLYSIS;  Surgeon: Jama Cordella MATSU, MD;  Location: [facility] INVASIVE CV LAB;  Service: Cardiovascular;  Laterality: Left;          Allergies  No Known Allergies           Current Outpatient Medications  Medication Sig Dispense Refill   amLODipine  (NORVASC ) 5 MG tablet Take 1 tablet (5 mg total) by mouth daily. 30 tablet 0   apixaban  (ELIQUIS ) 5 MG  TABS tablet Take 1 tablet (5 mg total) by mouth 2 (two) times daily. 60 tablet 0   cetirizine  (ZYRTEC ) 10 MG tablet Take 1 tablet (10 mg total) by mouth daily. 30 tablet 11   donepezil  (ARICEPT ) 5 MG tablet Take 1 tablet (5 mg total) by mouth at bedtime. 30 tablet 0   Multiple Vitamin (MULTIVITAMIN WITH MINERALS) TABS tablet Take 1 tablet by mouth daily. 90 tablet 0   mupirocin  cream (BACTROBAN ) 2 % Apply topically 2 (two) times daily. 15 g 0   tamsulosin  (FLOMAX ) 0.4 MG CAPS capsule Take 2 capsules (0.8 mg total) by mouth daily. 60 capsule 0   triamcinolone  cream (KENALOG ) 0.1 % Apply 1 Application topically 2 (two) times daily. 30 g 0      No current facility-administered medications for this visit.        Family History      Family History  Problem Relation Age of Onset   Hypertension Mother     Prostate cancer Neg Hx     Bladder Cancer Neg Hx     Kidney cancer Neg Hx              Social History Social History  Social History  Tobacco Use   Smoking status: Never      Passive exposure: Never   Smokeless tobacco: Never  Vaping Use   Vaping status: Never Used  Substance Use Topics   Alcohol use: No      Alcohol/week: 0.0 standard drinks of alcohol   Drug use: No            ROS Full ROS of systems performed and is otherwise negative there than what is stated in the HPI   Physical Exam Blood pressure (!) 150/82, pulse 94, height 5' 9 (1.753 m), weight 156 lb (70.8 kg), SpO2 99%.   Responds appropriately to questions but some information corroborated by his pastor and sister on the phone, normal work of breathing on room air, walks with a walker but is able to stand and transfer to exam table by himself, normal work of breathing on room air, regular rate and rhythm, abdomen is soft, scaphoid, right groin with obvious bulge upon standing but when he lays down I am able to easily reduce this, no bulge in his left groin.   Data Reviewed CT scan personally  reviewed and there is a inguinoscrotal hernia on the right side containing small bowel loops.   I have personally reviewed the patient's imaging and medical records.     Assessment/Plan Assessment 78 year old male who is on Eliquis  for left lower extremity DVT who presents in consultation for right inguinal scrotal hernia.  I discussed with him that I would recommend repair given the size of the hernia.  We discussed the risk, benefits alternatives to procedure including risk of infection, bleeding, damage to the vas deferens, testicular ischemia, chronic pain as well as recurrence.  We will use mesh with this.  We will plan to stop his Eliquis  on Tuesday of next week and proceed with surgery on October 3.       Ronnie Bullock 07/30/2024, 10:04 AM

## 2024-08-09 NOTE — Op Note (Signed)
 Operative Note  Preoperative diagnosis: Right inguinal scrotal hernia Postoperative diagnosis same Surgeon: Jayson Hobby, MD EBL: 10 cc Procedure: Open right inguinal hernia repair with mesh  After informed consent was obtained the patient was brought to the operating room placed supine on the operating room table.  General endotracheal anesthesia was then induced and his right groin was then prepped and draped in the usual sterile fashion.  A surgical timeout was called identifying correct patient, site, side and procedure.  A standard groin incision was made in the right groin and taken down through the subcutaneous tissue with Bovie cautery.  The external bleak was identified and cleared off.  The external oblique was opened with a scalpel.  There was a large direct hernia with blowout of the inguinal canal floor.  The cord structures were then dissected from the surrounding tissue and encircled with a Penrose drain.  The cremaster fibers were cleared off the cord and there did seem to be a small indirect hernia sac.  The pubic tubercle was then cleared off and the shelving edge was cleared off to allow for sewing in a row mesh.  A polypropylene mesh was then brought to the field.  It was secured to the pubic tubercle with a 2-0 Prolene.  The mesh was secured to the shelving edge with a running Prolene.  The superior edge of the mesh was then secured to the conjoined tendon with a series of 2-0 Prolene's in interrupted fashion.  The mesh was encircled around the cord and the tails were loosely secured with another 2-0 Prolene.  The tails of the mesh were then tucked under the external oblique fascia.  The mesh laid well and the patient was given a Valsalva and there was no evidence of bulging with Valsalva.  I did palpate both testicles to ensure that the testicle was within the scrotum bilaterally and they were.  The subcutaneous tissue was then irrigated with warm saline solution.  The external bleak  was then reapproximated with 3-0 Vicryl.  The skin was then closed in a deep dermal layer with 3-0 Vicryl and the skin was closed with 4-0 Monocryl and dressed with surgical glue.  The patient was taken to the PACU in good condition.  Prior to termination of the procedure all sponge and instrument counts were correct x 2.

## 2024-08-09 NOTE — Anesthesia Postprocedure Evaluation (Signed)
 Anesthesia Post Note  Patient: Ronnie Bullock  Procedure(s) Performed: REPAIR, HERNIA, INGUINAL, ADULT (Right: Inguinal) INSERTION OF MESH (Right: Inguinal)  Patient location during evaluation: PACU Anesthesia Type: General Level of consciousness: awake and alert, oriented and patient cooperative Pain management: pain level controlled Vital Signs Assessment: post-procedure vital signs reviewed and stable Respiratory status: spontaneous breathing, nonlabored ventilation and respiratory function stable Cardiovascular status: blood pressure returned to baseline and stable Postop Assessment: adequate PO intake Anesthetic complications: no   No notable events documented.   Last Vitals:  Vitals:   08/09/24 1030 08/09/24 1053  BP: (!) 130/99 (!) 139/96  Pulse: 79 81  Resp: 18 16  Temp: 36.5 C (!) 36.2 C  SpO2: 94% 92%    Last Pain:  Vitals:   08/09/24 1053  TempSrc: Temporal  PainSc: 0-No pain                 Alfonso Ruths

## 2024-08-09 NOTE — Transfer of Care (Signed)
 Immediate Anesthesia Transfer of Care Note  Patient: Ronnie Bullock  Procedure(s) Performed: REPAIR, HERNIA, INGUINAL, ADULT (Right: Inguinal) INSERTION OF MESH (Right: Inguinal)  Patient Location: PACU  Anesthesia Type:General  Level of Consciousness: awake and drowsy  Airway & Oxygen Therapy: Patient Spontanous Breathing  Post-op Assessment: Report given to RN and Post -op Vital signs reviewed and stable  Post vital signs: Reviewed and stable  Last Vitals:  Vitals Value Taken Time  BP 133/101 08/09/24 10:01  Temp    Pulse 79 08/09/24 10:03  Resp 12 08/09/24 10:03  SpO2 93 % 08/09/24 10:03  Vitals shown include unfiled device data.  Last Pain:  Vitals:   08/09/24 0659  TempSrc: Oral  PainSc: 0-No pain         Complications: No notable events documented.

## 2024-08-12 ENCOUNTER — Encounter: Payer: Self-pay | Admitting: General Surgery

## 2024-08-12 ENCOUNTER — Telehealth: Payer: Self-pay

## 2024-08-16 ENCOUNTER — Other Ambulatory Visit: Payer: Self-pay | Admitting: Family Medicine

## 2024-08-19 NOTE — Telephone Encounter (Signed)
 Requested medications are due for refill today.  yes  Requested medications are on the active medications list.  yes  Last refill. 07/29/2024 #30 0 rf  Future visit scheduled.   no  Notes to clinic.  Pt last seen 12/13/2023. Pt already given a courtesy refill.    Requested Prescriptions  Pending Prescriptions Disp Refills   amLODipine  (NORVASC ) 5 MG tablet [Pharmacy Med Name: AMLODIPINE  BESYLATE 5 MG TAB] 90 tablet     Sig: TAKE 1 TABLET BY MOUTH ONCE EVERY MORNING     Cardiovascular: Calcium  Channel Blockers 2 Failed - 08/19/2024  4:44 PM      Failed - Last BP in normal range    BP Readings from Last 1 Encounters:  08/09/24 (!) 139/96         Failed - Valid encounter within last 6 months    Recent Outpatient Visits           8 months ago Viral upper respiratory tract infection   [place] [facility] Melvin Pao, NP              Passed - Last Heart Rate in normal range    Pulse Readings from Last 1 Encounters:  08/09/24 81          donepezil  (ARICEPT ) 5 MG tablet [Pharmacy Med Name: DONEPEZIL  HCL 5 MG TAB] 90 tablet     Sig: TAKE 1 TABLET BY MOUTH AT BEDTIME     Neurology:  Alzheimer's Agents Failed - 08/19/2024  4:44 PM      Failed - Valid encounter within last 6 months    Recent Outpatient Visits           8 months ago Viral upper respiratory tract infection   [facility] Melvin Pao, NP

## 2024-08-20 ENCOUNTER — Other Ambulatory Visit: Payer: Self-pay

## 2024-08-20 ENCOUNTER — Other Ambulatory Visit: Payer: Self-pay | Admitting: Family Medicine

## 2024-08-20 MED ORDER — DONEPEZIL HCL 5 MG PO TABS: 5.0000 mg | ORAL_TABLET | Freq: Every day | ORAL | 0 refills | Status: DC

## 2024-08-20 MED ORDER — AMLODIPINE BESYLATE 5 MG PO TABS: 5.0000 mg | ORAL_TABLET | Freq: Every day | ORAL | 0 refills | Status: DC

## 2024-08-20 NOTE — Telephone Encounter (Unsigned)
 Copied from CRM [id number]. Topic: Clinical - Prescription Issue >> Aug 19, 2024  4:56 PM Delon T wrote: Reason for CRM: amLODipine  (NORVASC ) 5 MG tablet and donepezil  (ARICEPT ) 5 MG tablet- [facility] trying to refill prescriptions but getting message too soon, [phone]

## 2024-08-20 NOTE — Telephone Encounter (Signed)
 Copied from CRM #[id number]. Topic: Clinical - Prescription Issue >> Aug 20, 2024  3:29 PM Willma R wrote: Reason for CRM: Patients sister Stephane is calling in regards to the patients request for a refill of amLODipine  (NORVASC ) 5 MG tablet and donepezil  (ARICEPT ) 5 MG tablet. Refill was declined but Dawn states the patient had a stroke and then just had hernia surgery at the beginning of this month so has not been able to get him scheduled with Dr Vicci. Is requesting to see if the medication can be refilled based on the circumstances. We did schedule an appointment to come in at his first available on 11/13  Dawn can be reached at [phone]

## 2024-08-22 ENCOUNTER — Ambulatory Visit: Admitting: General Surgery

## 2024-08-22 ENCOUNTER — Encounter: Payer: Self-pay | Admitting: General Surgery

## 2024-08-22 VITALS — BP 143/80 | HR 93 | Ht 69.0 in | Wt 157.0 lb

## 2024-08-22 DIAGNOSIS — Z09 Encounter for follow-up examination after completed treatment for conditions other than malignant neoplasm: Secondary | ICD-10-CM

## 2024-08-22 DIAGNOSIS — K4090 Unilateral inguinal hernia, without obstruction or gangrene, not specified as recurrent: Secondary | ICD-10-CM

## 2024-08-22 NOTE — Progress Notes (Signed)
 Patient returns status post right inguinal hernia repair.  He reports doing well.  He says that he is he is not in any pain.  He is tolerating a diet and having normal bowel function.  His brother is here with him and corroborates this information.  On exam his incision is healing well without any erythema.  There is still some induration over the groin but there is no bulge with Valsalva.  Status post right inguinal hernia repair doing well.  Can follow-up as needed.

## 2024-08-22 NOTE — Patient Instructions (Signed)
 GENERAL POST-OPERATIVE PATIENT INSTRUCTIONS   WOUND CARE INSTRUCTIONS:  Try to keep the wound dry and avoid ointments on the wound unless directed to do so.  If the wound becomes bright red and painful or starts to drain infected material that is not clear, please contact your physician immediately.  If the wound is mildly pink and has a thick firm ridge underneath it, this is normal, and is referred to as a healing ridge.  This will resolve over the next 4-6 weeks.  BATHING: You may shower if you have been informed of this by your surgeon. However, Please do not submerge in a tub, hot tub, or pool until incisions are completely sealed or have been told by your surgeon that you may do so.  DIET:  You may eat any foods that you can tolerate.  It is a good idea to eat a high fiber diet and take in plenty of fluids to prevent constipation.  If you do become constipated you may want to take a mild laxative or take ducolax tablets on a daily basis until your bowel habits are regular.  Constipation can be very uncomfortable, along with straining, after recent surgery.  ACTIVITY:  You are encouraged to walk and engage in light activity for the next two weeks.  You should not lift more than 20 pounds for 6 weeks total after surgery as it could put you at increased risk for complications.  Twenty pounds is roughly equivalent to a plastic bag of groceries. At that time- Listen to your body when lifting, if you have pain when lifting, stop and then try again in a few days. Soreness after doing exercises or activities of daily living is normal as you get back in to your normal routine.  MEDICATIONS:  Try to take narcotic medications and anti-inflammatory medications, such as tylenol, ibuprofen, naprosyn, etc., with food.  This will minimize stomach upset from the medication.  Should you develop nausea and vomiting from the pain medication, or develop a rash, please discontinue the medication and contact your  physician.  You should not drive, make important decisions, or operate machinery when taking narcotic pain medication.  SUNBLOCK Use sun block to incision area over the next year if this area will be exposed to sun. This helps decrease scarring and will allow you avoid a permanent darkened area over your incision.  QUESTIONS:  Please feel free to call our office if you have any questions, and we will be glad to assist you. [phone]

## 2024-08-23 NOTE — Telephone Encounter (Signed)
 Rx request sent to [facility]

## 2024-08-23 NOTE — Addendum Note (Signed)
 Addended by: Sakina Briones T on: 08/23/2024 01:58 PM   Modules accepted: Orders

## 2024-09-06 ENCOUNTER — Telehealth: Payer: Self-pay

## 2024-09-06 NOTE — Patient Instructions (Signed)
 Johm L Wilcoxson - I am sorry I was unable to reach you today for our scheduled appointment. I work with Vicci Duwaine SQUIBB, DO and am calling to support your healthcare needs. Please contact me at [phone] at your earliest convenience. I look forward to speaking with you soon.   Thank you,  Santana Stamp BSN, CCM [place]  [facility] Population Health RN Care Manager Direct Dial: [phone]  Fax: [phone]

## 2024-09-12 ENCOUNTER — Other Ambulatory Visit: Payer: Self-pay | Admitting: Family Medicine

## 2024-09-13 ENCOUNTER — Other Ambulatory Visit: Payer: Self-pay | Admitting: Family Medicine

## 2024-09-13 ENCOUNTER — Telehealth: Payer: Self-pay

## 2024-09-13 NOTE — Telephone Encounter (Signed)
 Copied from CRM #[id number]. Topic: Clinical - Medication Refill >> Sep 13, 2024 11:29 AM Suzen RAMAN wrote: Medication: donepezil  (ARICEPT ) 5 MG tablet amLODipine  (NORVASC ) 5 MG tablet   Has the patient contacted their pharmacy? Yes  This is the patient's preferred pharmacy:  [facility] - [place], [place] - [address] [place] [address] Phone: [phone] Fax: [phone]  Is this the correct pharmacy for this prescription? Yes If no, delete pharmacy and type the correct one.   Has the prescription been filled recently? No  Is the patient out of the medication? Yes  Has the patient been seen for an appointment in the last year OR does the patient have an upcoming appointment? Yes  Can we respond through MyChart? Yes  Agent: Please be advised that Rx refills may take up to 3 business days. We ask that you follow-up with your pharmacy.

## 2024-09-13 NOTE — Telephone Encounter (Signed)
 Requested Prescriptions  Pending Prescriptions Disp Refills   donepezil  (ARICEPT ) 5 MG tablet [Pharmacy Med Name: DONEPEZIL  HCL 5 MG TAB] 90 tablet 0    Sig: TAKE 1 TABLET BY MOUTH AT BEDTIME     Neurology:  Alzheimer's Agents Failed - 09/13/2024  4:11 PM      Failed - Valid encounter within last 6 months    Recent Outpatient Visits           9 months ago Viral upper respiratory tract infection   [place] [facility] Melvin Pao, NP               amLODipine  (NORVASC ) 5 MG tablet [Pharmacy Med Name: AMLODIPINE  BESYLATE 5 MG TAB] 90 tablet 0    Sig: TAKE 1 TABLET BY MOUTH ONCE DAILY     Cardiovascular: Calcium  Channel Blockers 2 Failed - 09/13/2024  4:11 PM      Failed - Last BP in normal range    BP Readings from Last 1 Encounters:  08/22/24 (!) 143/80         Failed - Valid encounter within last 6 months    Recent Outpatient Visits           9 months ago Viral upper respiratory tract infection   [facility] Melvin Pao, NP              Passed - Last Heart Rate in normal range    Pulse Readings from Last 1 Encounters:  08/22/24 93

## 2024-09-14 ENCOUNTER — Other Ambulatory Visit: Payer: Self-pay | Admitting: Family Medicine

## 2024-09-14 NOTE — Telephone Encounter (Signed)
 Already refilled on 09/13/24 in a separate refill encounter.

## 2024-09-16 NOTE — Telephone Encounter (Signed)
 Requested Prescriptions  Refused Prescriptions Disp Refills   donepezil  (ARICEPT ) 5 MG tablet [Pharmacy Med Name: DONEPEZIL  HCL 5 MG TAB] 90 tablet 0    Sig: TAKE 1 TABLET BY MOUTH AT BEDTIME     Neurology:  Alzheimer's Agents Failed - 09/16/2024  2:37 PM      Failed - Valid encounter within last 6 months    Recent Outpatient Visits           9 months ago Viral upper respiratory tract infection   [place] [facility] Melvin Pao, NP               amLODipine  (NORVASC ) 5 MG tablet [Pharmacy Med Name: AMLODIPINE  BESYLATE 5 MG TAB] 90 tablet 0    Sig: TAKE 1 TABLET BY MOUTH ONCE DAILY     Cardiovascular: Calcium  Channel Blockers 2 Failed - 09/16/2024  2:37 PM      Failed - Last BP in normal range    BP Readings from Last 1 Encounters:  08/22/24 (!) 143/80         Failed - Valid encounter within last 6 months    Recent Outpatient Visits           9 months ago Viral upper respiratory tract infection    [facility] Melvin Pao, NP              Passed - Last Heart Rate in normal range    Pulse Readings from Last 1 Encounters:  08/22/24 93

## 2024-09-16 NOTE — Patient Outreach (Signed)
 Closing out of CCM due to UTR, note sent to Dr. [place] to inform.

## 2024-09-16 NOTE — Patient Instructions (Signed)
 Johm L Wilcoxson - I am sorry I was unable to reach you today for our scheduled appointment. I work with Vicci Duwaine SQUIBB, DO and am calling to support your healthcare needs. Please contact me at [phone] at your earliest convenience. I look forward to speaking with you soon.   Thank you,  Santana Stamp BSN, CCM [place]  [facility] Population Health RN Care Manager Direct Dial: [phone]  Fax: [phone]

## 2024-09-19 ENCOUNTER — Encounter: Payer: Self-pay | Admitting: Family Medicine

## 2024-09-19 ENCOUNTER — Ambulatory Visit: Admitting: Family Medicine

## 2024-09-19 VITALS — BP 138/82 | HR 93 | Temp 97.9°F | Ht 69.0 in | Wt 161.4 lb

## 2024-09-19 DIAGNOSIS — E7800 Pure hypercholesterolemia, unspecified: Secondary | ICD-10-CM | POA: Diagnosis not present

## 2024-09-19 DIAGNOSIS — F0390 Unspecified dementia without behavioral disturbance: Secondary | ICD-10-CM | POA: Diagnosis not present

## 2024-09-19 DIAGNOSIS — I10 Essential (primary) hypertension: Secondary | ICD-10-CM | POA: Diagnosis not present

## 2024-09-19 DIAGNOSIS — I69359 Hemiplegia and hemiparesis following cerebral infarction affecting unspecified side: Secondary | ICD-10-CM

## 2024-09-19 DIAGNOSIS — I25118 Atherosclerotic heart disease of native coronary artery with other forms of angina pectoris: Secondary | ICD-10-CM

## 2024-09-19 DIAGNOSIS — Z23 Encounter for immunization: Secondary | ICD-10-CM

## 2024-09-19 DIAGNOSIS — R7301 Impaired fasting glucose: Secondary | ICD-10-CM

## 2024-09-19 DIAGNOSIS — Z8546 Personal history of malignant neoplasm of prostate: Secondary | ICD-10-CM | POA: Diagnosis not present

## 2024-09-19 DIAGNOSIS — Z0000 Encounter for general adult medical examination without abnormal findings: Secondary | ICD-10-CM

## 2024-09-19 LAB — MICROALBUMIN, URINE WAIVED
Creatinine, Urine Waived: 200 mg/dL (ref 10–300)
Microalb, Ur Waived: 150 mg/L — ABNORMAL HIGH (ref 0–19)
Microalb/Creat Ratio: >300 mg/g — ABNORMAL HIGH (ref <30)

## 2024-09-19 MED ORDER — ROSUVASTATIN CALCIUM 10 MG PO TABS: 10.0000 mg | ORAL_TABLET | Freq: Every day | ORAL | 1 refills | Status: AC

## 2024-09-19 MED ORDER — AMLODIPINE BESYLATE 5 MG PO TABS: 5.0000 mg | ORAL_TABLET | Freq: Every day | ORAL | 1 refills | Status: AC

## 2024-09-19 MED ORDER — APIXABAN 5 MG PO TABS: 5.0000 mg | ORAL_TABLET | Freq: Two times a day (BID) | ORAL | 1 refills | Status: AC

## 2024-09-19 MED ORDER — TAMSULOSIN HCL 0.4 MG PO CAPS: 0.8000 mg | ORAL_CAPSULE | Freq: Every day | ORAL | 1 refills | Status: AC

## 2024-09-19 MED ORDER — DONEPEZIL HCL 5 MG PO TABS: 5.0000 mg | ORAL_TABLET | Freq: Every day | ORAL | 0 refills | Status: AC

## 2024-09-19 NOTE — Patient Instructions (Signed)
 Preventative Services:  Health Risk Assessment and Personalized Prevention Plan: Done today Bone Mass Measurements: N/A CVD Screening: done today Colon Cancer Screening: N/A Depression Screening: Done today Diabetes Screening: Done today Glaucoma Screening: see your eye doctor Hepatitis B vaccine: N/A Hepatitis C screening: Up to date HIV Screening: up to date Flu Vaccine:done today Lung cancer Screening: N/A Obesity Screening: done today Pneumonia Vaccines: up to date STI Screening: N/A PSA screening: Done today  Mr. Ronnie Bullock,  Thank you for taking the time for your Medicare Wellness Visit. I appreciate your continued commitment to your health goals. Please review the care plan we discussed, and feel free to reach out if I can assist you further.  Please note that Annual Wellness Visits do not include a physical exam. Some assessments may be limited, especially if the visit was conducted virtually. If needed, we may recommend an in-person follow-up with your provider.  Ongoing Care Seeing your primary care provider every 3 to 6 months helps us  monitor your health and provide consistent, personalized care.   Referrals If a referral was made during today's visit and you haven't received any updates within two weeks, please contact the referred provider directly to check on the status.  Recommended Screenings:  Health Maintenance  Topic Date Due   Zoster (Shingles) Vaccine (1 of 2) Never done   Medicare Annual Wellness Visit  08/08/2024   COVID-19 Vaccine (4 - Mixed Product risk 2025-26 season) 03/19/2025   DTaP/Tdap/Td vaccine (2 - Tdap) 01/01/2029   Pneumococcal Vaccine for age over 68  Completed   Flu Shot  Completed   Hepatitis C Screening  Completed   Meningitis B Vaccine  Aged Out   Colon Cancer Screening  Discontinued       09/19/2024    8:39 AM  Advanced Directives  Does Patient Have a Medical Advance Directive? --    Vision: Annual vision screenings are  recommended for early detection of glaucoma, cataracts, and diabetic retinopathy. These exams can also reveal signs of chronic conditions such as diabetes and high blood pressure.  Dental: Annual dental screenings help detect early signs of oral cancer, gum disease, and other conditions linked to overall health, including heart disease and diabetes.  Please see the attached documents for additional preventive care recommendations.

## 2024-09-19 NOTE — Progress Notes (Signed)
 BP 138/82 (BP Location: Left Arm, Patient Position: Sitting, Cuff Size: Normal)   Pulse 93   Temp 97.9 F (36.6 C) (Oral)   Ht 5' 9 (1.753 m)   Wt 161 lb 6.4 oz (73.2 kg)   SpO2 94%   BMI 23.83 kg/m    Subjective:    Patient ID: Ronnie Bullock, male    DOB: Jun 27, 1946, 78 y.o.   MRN: [id number]  HPI: Ronnie Bullock is a 78 y.o. male presenting on 09/19/2024 for comprehensive medical examination. Current medical complaints include:  HYPERTENSION / HYPERLIPIDEMIA Satisfied with current treatment? yes Duration of hypertension: chronic BP monitoring frequency: not checking BP medication side effects: no Past blood pressure medicine: amlodipine  Duration of hyperlipidemia: chronic Cholesterol medication side effects: no Cholesterol supplements: none Past cholesterol medications: crestor Medication compliance: excellent compliance Aspirin: no Recent stressors: no Recurrent headaches: no Visual changes: no Palpitations: no Dyspnea: no Chest pain: no Lower extremity edema: no Dizzy/lightheaded: no  BPH BPH status: controlled Satisfied with current treatment?: yes Medication side effects: no Medication compliance: excellent compliance Duration: chronic Nocturia: 1/night Urinary frequency:no Incomplete voiding: no Urgency: no Weak urinary stream: no Straining to start stream: no Dysuria: no Onset: gradual Severity: mild   He currently lives with: his brother Interim Problems from his last visit: yes  Functional Status Survey: Is the patient deaf or have difficulty hearing?: No Does the patient have difficulty seeing, even when wearing glasses/contacts?: No Does the patient have difficulty concentrating, remembering, or making decisions?: Yes Does the patient have difficulty walking or climbing stairs?: Yes Does the patient have difficulty dressing or bathing?: No Does the patient have difficulty doing errands alone such as visiting a doctor's office or  shopping?: Yes  FALL RISK:    09/19/2024    8:39 AM 08/22/2024    1:04 PM 06/07/2024   11:50 AM 09/05/2023   10:49 AM 08/09/2023   11:11 AM  Fall Risk   Falls in the past year? 1 0 0 0 0  Number falls in past yr: 0 0  0 0  Injury with Fall? 0 0  0 0  Risk for fall due to : Impaired balance/gait;Impaired mobility;Other (Comment)      Risk for fall due to: Comment Uses walker      Follow up Falls evaluation completed   Falls evaluation completed Falls evaluation completed    Depression Screen    12/13/2023    9:28 AM 08/09/2023   11:11 AM 06/26/2023   11:12 AM 08/02/2022   12:55 PM 01/24/2022    1:24 PM  Depression screen PHQ 2/9  Decreased Interest 0 0 0 0 0  Down, Depressed, Hopeless 0 0 0 0 0  PHQ - 2 Score 0 0 0 0 0  Altered sleeping 0  0 0 0  Tired, decreased energy 0  0 0 0  Change in appetite 0  0 0 0  Feeling bad or failure about yourself  0  0 0 0  Trouble concentrating 0  0 2 0  Moving slowly or fidgety/restless 0  2 0 0  Suicidal thoughts 0  0 0 0  PHQ-9 Score 0   2  2  0   Difficult doing work/chores    Not difficult at all      Data saved with a previous flowsheet row definition    Advanced Directives Does patient have a HCPOA?    no If yes, name and contact information:  Does patient have  a living will or MOST form?  no  Past Medical History:  Past Medical History:  Diagnosis Date   Acute CVA (cerebrovascular accident) (HCC) 03/22/2024   Benign neoplasm of ascending colon    Benign neoplasm of cecum    Cancer (HCC)    Chronic anticoagulation 03/21/2024   Coronary artery disease    Demand ischemia (HCC) 02/12/2021   Dementia (HCC)    Dementia (HCC)    Dementia without behavioral disturbance (HCC) 01/21/2021   DVT (deep venous thrombosis) (HCC) 01/20/2021   Encysted hydrocele 03/28/2019   Hyperlipidemia    Hypertension    Incontinence    Seasonal allergic rhinitis due to pollen 10/22/2021    Surgical History:  Past Surgical History:  Procedure  Laterality Date   COLONOSCOPY WITH PROPOFOL  N/A 05/16/2018   Procedure: COLONOSCOPY WITH PROPOFOL ;  Surgeon: Janalyn Keene NOVAK, MD;  Location: [facility];  Service: Endoscopy;  Laterality: N/A;   INGUINAL HERNIA REPAIR Right 08/09/2024   Procedure: REPAIR, HERNIA, INGUINAL, ADULT;  Surgeon: Marinda Jayson KIDD, MD;  Location: [facility];  Service: General;  Laterality: Right;  open procedure with mesh   INSERTION OF MESH Right 08/09/2024   Procedure: INSERTION OF MESH;  Surgeon: Marinda Jayson KIDD, MD;  Location: [facility];  Service: General;  Laterality: Right;   PERIPHERAL VASCULAR THROMBECTOMY Left 01/22/2021   Procedure: PERIPHERAL VASCULAR THROMBECTOMY / THROMBOLYSIS;  Surgeon: Jama Cordella MATSU, MD;  Location: [facility];  Service: Cardiovascular;  Laterality: Left;    Medications:  Current Outpatient Medications on File Prior to Visit  Medication Sig   acetaminophen  (TYLENOL ) 325 MG tablet Take 650 mg by mouth every 6 (six) hours as needed for moderate pain (pain score 4-6).   cetirizine  (ZYRTEC ) 10 MG tablet Take 1 tablet (10 mg total) by mouth daily.   fluticasone  (FLONASE ) 50 MCG/ACT nasal spray Place 2 sprays into both nostrils daily.   Multiple Vitamin (MULTIVITAMIN WITH MINERALS) TABS tablet Take 1 tablet by mouth daily.   mupirocin  cream (BACTROBAN ) 2 % Apply topically 2 (two) times daily. (Patient taking differently: Apply 1 Application topically 2 (two) times daily as needed (wound care).)   triamcinolone  cream (KENALOG ) 0.1 % Apply 1 Application topically 2 (two) times daily.   No current facility-administered medications on file prior to visit.    Allergies:  No Known Allergies  Social History:  Social History   Socioeconomic History   Marital status: Widowed    Spouse name: Not on file   Number of children: Not on file   Years of education: Not on file   Highest education level: 12th grade  Occupational History   Not on file  Tobacco Use   Smoking  status: Never    Passive exposure: Never   Smokeless tobacco: Never  Vaping Use   Vaping status: Never Used  Substance and Sexual Activity   Alcohol use: No    Alcohol/week: 0.0 standard drinks of alcohol   Drug use: No   Sexual activity: Yes  Other Topics Concern   Not on file  Social History Narrative   Not on file   Social Drivers of Health   Financial Resource Strain: High Risk (05/30/2024)   Overall Financial Resource Strain (CARDIA)    Difficulty of Paying Living Expenses: Very hard  Food Insecurity: Food Insecurity Present (05/30/2024)   Hunger Vital Sign    Worried About Running Out of Food in the Last Year: Often true    Ran Out of Food in  the Last Year: Often true  Transportation Needs: No Transportation Needs (03/22/2024)   PRAPARE - Administrator, Civil Service (Medical): No    Lack of Transportation (Non-Medical): No  Physical Activity: Inactive (08/02/2022)   Exercise Vital Sign    Days of Exercise per Week: 0 days    Minutes of Exercise per Session: 0 min  Stress: No Stress Concern Present (08/02/2022)   [employer] of Occupational Health - Occupational Stress Questionnaire    Feeling of Stress : Only a little  Social Connections: Moderately Integrated (03/22/2024)   Social Connection and Isolation Panel    Frequency of Communication with Friends and Family: Never    Frequency of Social Gatherings with Friends and Family: More than three times a week    Attends Religious Services: More than 4 times per year    Active Member of [employer] or Organizations: Yes    Attends Banker Meetings: Never    Marital Status: Widowed  Intimate Partner Violence: Not At Risk (05/29/2024)   Humiliation, Afraid, Rape, and Kick questionnaire    Fear of Current or Ex-Partner: No    Emotionally Abused: No    Physically Abused: No    Sexually Abused: No   Social History   Tobacco Use  Smoking Status Never   Passive exposure: Never  Smokeless Tobacco  Never   Social History   Substance and Sexual Activity  Alcohol Use No   Alcohol/week: 0.0 standard drinks of alcohol    Family History:  Family History  Problem Relation Age of Onset   Hypertension Mother    Prostate cancer Neg Hx    Bladder Cancer Neg Hx    Kidney cancer Neg Hx     Past medical history, surgical history, medications, allergies, family history and social history reviewed with patient today and changes made to appropriate areas of the chart.   Review of Systems  Constitutional: Negative.   HENT: Negative.    Eyes: Negative.   Respiratory: Negative.    Cardiovascular: Negative.   Gastrointestinal: Negative.   Genitourinary: Negative.   Musculoskeletal: Negative.   Skin: Negative.   Neurological:  Positive for weakness. Negative for dizziness, tingling, tremors, sensory change, speech change, focal weakness, seizures, loss of consciousness and headaches.  Endo/Heme/Allergies: Negative.   Psychiatric/Behavioral: Negative.     All other ROS negative except what is listed above and in the HPI.      Objective:    BP 138/82 (BP Location: Left Arm, Patient Position: Sitting, Cuff Size: Normal)   Pulse 93   Temp 97.9 F (36.6 C) (Oral)   Ht 5' 9 (1.753 m)   Wt 161 lb 6.4 oz (73.2 kg)   SpO2 94%   BMI 23.83 kg/m   Wt Readings from Last 3 Encounters:  09/19/24 161 lb 6.4 oz (73.2 kg)  08/22/24 157 lb (71.2 kg)  08/09/24 155 lb (70.3 kg)     Physical Exam Vitals and nursing note reviewed.  Constitutional:      General: He is not in acute distress.    Appearance: Normal appearance. He is not ill-appearing, toxic-appearing or diaphoretic.  HENT:     Head: Normocephalic and atraumatic.     Right Ear: Tympanic membrane, ear canal and external ear normal. There is no impacted cerumen.     Left Ear: Tympanic membrane, ear canal and external ear normal. There is no impacted cerumen.     Nose: Nose normal. No congestion or rhinorrhea.  Mouth/Throat:      Mouth: Mucous membranes are moist.     Pharynx: Oropharynx is clear. No oropharyngeal exudate or posterior oropharyngeal erythema.  Eyes:     General: No scleral icterus.       Right eye: No discharge.        Left eye: No discharge.     Extraocular Movements: Extraocular movements intact.     Conjunctiva/sclera: Conjunctivae normal.     Pupils: Pupils are equal, round, and reactive to light.  Neck:     Vascular: No carotid bruit.  Cardiovascular:     Rate and Rhythm: Normal rate and regular rhythm.     Pulses: Normal pulses.     Heart sounds: No murmur heard.    No friction rub. No gallop.  Pulmonary:     Effort: Pulmonary effort is normal. No respiratory distress.     Breath sounds: Normal breath sounds. No stridor. No wheezing, rhonchi or rales.  Chest:     Chest wall: No tenderness.  Abdominal:     General: Abdomen is flat. Bowel sounds are normal. There is no distension.     Palpations: Abdomen is soft. There is no mass.     Tenderness: There is no abdominal tenderness. There is no right CVA tenderness, left CVA tenderness, guarding or rebound.     Hernia: No hernia is present.  Genitourinary:    Comments: Genital exam deferred with shared decision making Musculoskeletal:        General: No swelling, tenderness, deformity or signs of injury.     Cervical back: Normal range of motion and neck supple. No rigidity. No muscular tenderness.     Right lower leg: No edema.     Left lower leg: No edema.  Lymphadenopathy:     Cervical: No cervical adenopathy.  Skin:    General: Skin is warm and dry.     Capillary Refill: Capillary refill takes less than 2 seconds.     Coloration: Skin is not jaundiced or pale.     Findings: No bruising, erythema, lesion or rash.  Neurological:     General: No focal deficit present.     Mental Status: He is alert and oriented to person, place, and time.     Cranial Nerves: No cranial nerve deficit.     Sensory: No sensory deficit.     Motor:  Weakness (R side) present.     Coordination: Coordination normal.     Gait: Gait normal.     Deep Tendon Reflexes: Reflexes normal.  Psychiatric:        Mood and Affect: Mood normal.        Behavior: Behavior normal.        Thought Content: Thought content normal.        Judgment: Judgment normal.        08/09/2023   11:06 AM 08/02/2022   12:38 PM 07/27/2021    5:15 PM 12/10/2018    1:22 PM 11/10/2017   11:01 AM  6CIT Screen  What Year? 4 points 4 points 4 points 0 points 0 points  What month? 3 points 0 points 0 points 0 points 0 points  What time? 0 points 3 points 3 points 0 points 0 points  Count back from 20 4 points 4 points 0 points 0 points 0 points  Months in reverse 4 points 4 points 0 points 0 points 0 points  Repeat phrase 10 points 10 points 0 points 8 points 10 points  Total Score 25 points 25 points 7 points 8 points 10 points     Results for orders placed or performed in visit on 09/19/24  Microalbumin, Urine Waived   Collection Time: 09/19/24  8:50 AM  Result Value Ref Range   Microalb, Ur Waived 150 (H) 0 - 19 mg/L   Creatinine, Urine Waived 200 10 - 300 mg/dL   Microalb/Creat Ratio >300 (H) <30 mg/g  Comprehensive metabolic panel with GFR   Collection Time: 09/19/24  8:51 AM  Result Value Ref Range   Glucose 74 70 - 99 mg/dL   BUN 15 8 - 27 mg/dL   Creatinine, Ser 8.66 (H) 0.76 - 1.27 mg/dL   eGFR 54 (L) >40 fO/fpw/8.26   BUN/Creatinine Ratio 11 10 - 24   Sodium 140 134 - 144 mmol/L   Potassium 4.4 3.5 - 5.2 mmol/L   Chloride 102 96 - 106 mmol/L   CO2 24 20 - 29 mmol/L   Calcium  9.1 8.6 - 10.2 mg/dL   Total Protein 7.1 6.0 - 8.5 g/dL   Albumin 3.4 (L) 3.8 - 4.8 g/dL   Globulin, Total 3.7 1.5 - 4.5 g/dL   Bilirubin Total 0.2 0.0 - 1.2 mg/dL   Alkaline Phosphatase 102 47 - 123 IU/L   AST 16 0 - 40 IU/L   ALT 9 0 - 44 IU/L  CBC with Differential/Platelet   Collection Time: 09/19/24  8:51 AM  Result Value Ref Range   WBC 4.5 3.4 - 10.8 x10E3/uL    RBC 4.75 4.14 - 5.80 x10E6/uL   Hemoglobin 14.1 13.0 - 17.7 g/dL   Hematocrit 55.9 62.4 - 51.0 %   MCV 93 79 - 97 fL   MCH 29.7 26.6 - 33.0 pg   MCHC 32.0 31.5 - 35.7 g/dL   RDW 85.9 88.3 - 84.5 %   Platelets 221 150 - 450 x10E3/uL   Neutrophils 42 Not Estab. %   Lymphs 30 Not Estab. %   Monocytes 15 Not Estab. %   Eos 12 Not Estab. %   Basos 1 Not Estab. %   Neutrophils Absolute 1.9 1.4 - 7.0 x10E3/uL   Lymphocytes Absolute 1.4 0.7 - 3.1 x10E3/uL   Monocytes Absolute 0.7 0.1 - 0.9 x10E3/uL   EOS (ABSOLUTE) 0.5 (H) 0.0 - 0.4 x10E3/uL   Basophils Absolute 0.0 0.0 - 0.2 x10E3/uL   Immature Granulocytes 0 Not Estab. %   Immature Grans (Abs) 0.0 0.0 - 0.1 x10E3/uL  Lipid Panel w/o Chol/HDL Ratio   Collection Time: 09/19/24  8:51 AM  Result Value Ref Range   Cholesterol, Total 163 100 - 199 mg/dL   Triglycerides 85 0 - 149 mg/dL   HDL 55 >60 mg/dL   VLDL Cholesterol Cal 16 5 - 40 mg/dL   LDL Chol Calc (NIH) 92 0 - 99 mg/dL  PSA   Collection Time: 09/19/24  8:51 AM  Result Value Ref Range   Prostate Specific Ag, Serum 0.1 0.0 - 4.0 ng/mL  TSH   Collection Time: 09/19/24  8:51 AM  Result Value Ref Range   TSH 1.880 0.450 - 4.500 uIU/mL      Assessment & Plan:   Problem List Items Addressed This Visit       Cardiovascular and Mediastinum   Primary hypertension   Under good control on current regimen. Continue current regimen. Continue to monitor. Call with any concerns. Refills given. Labs drawn today.        Relevant Medications   rosuvastatin (CRESTOR) 10  MG tablet   amLODipine  (NORVASC ) 5 MG tablet   apixaban  (ELIQUIS ) 5 MG TABS tablet   Other Relevant Orders   TSH (Completed)   Microalbumin, Urine Waived (Completed)   CAD (coronary artery disease)   Will keep his BP and cholesterol under good control. Continue to monitor. Call with any concerns.       Relevant Medications   rosuvastatin (CRESTOR) 10 MG tablet   amLODipine  (NORVASC ) 5 MG tablet   apixaban   (ELIQUIS ) 5 MG TABS tablet   Other Relevant Orders   Comprehensive metabolic panel with GFR (Completed)   CBC with Differential/Platelet (Completed)     Endocrine   IFG (impaired fasting glucose)     Nervous and Auditory   Dementia without behavioral disturbance (HCC)   Stable. Has help with his brother. Continue aricept . Continue to monitor. Call with any concerns. Refills given today.       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 Continue to monitor. Call with any concerns.       Relevant Orders   Comprehensive metabolic panel with GFR (Completed)   CBC with Differential/Platelet (Completed)     Other   Hypercholesteremia   Under good control on current regimen. Continue current regimen. Continue to monitor. Call with any concerns. Refills given. Labs drawn today.       Relevant Medications   rosuvastatin (CRESTOR) 10 MG tablet   amLODipine  (NORVASC ) 5 MG tablet   apixaban  (ELIQUIS ) 5 MG TABS tablet   Other Relevant Orders   Comprehensive metabolic panel with GFR (Completed)   CBC with Differential/Platelet (Completed)   Lipid Panel w/o Chol/HDL Ratio (Completed)   History of prostate cancer   Rechecking PSA today. Await results. Treat as needed.       Relevant Orders   Comprehensive metabolic panel with GFR (Completed)   CBC with Differential/Platelet (Completed)   PSA (Completed)   Other Visit Diagnoses       Encounter for Medicare annual wellness exam    -  Primary   Preventative care discussed today.     Routine general medical examination at a health care facility       Vaccines updated. Screening labs checked today. Continue diet and exercise. Call with any  concerns.     Needs flu shot       Flu shot given today.   Relevant Orders   Flu vaccine HIGH DOSE PF(Fluzone Trivalent) (Completed)     Need for COVID-19 vaccine       COVID shot given today.   Relevant Orders   Civil Engineer, Contracting Covid -19 Vaccine 32yrs and older (Completed)        Preventative Services:  Health Risk Assessment and Personalized Prevention Plan: Done today Bone Mass Measurements: N/A CVD Screening: done today Colon Cancer Screening: N/A Depression Screening: Done today Diabetes Screening: Done today Glaucoma Screening: see your eye doctor Hepatitis B vaccine: N/A Hepatitis C screening: Up to date HIV Screening: up to date Flu Vaccine:done today Lung cancer Screening: N/A Obesity Screening: done today Pneumonia Vaccines: up to date STI Screening: N/A PSA screening: Done today  LABORATORY TESTING:  Health maintenance labs ordered today as discussed above.   The natural history of  prostate cancer and ongoing controversy regarding screening and potential treatment outcomes of prostate cancer has been discussed with the patient. The meaning of a false positive PSA and a false negative PSA has been discussed. He indicates understanding of the limitations of this screening test and wishes to proceed with screening PSA testing.   IMMUNIZATIONS:   - Tdap: Tetanus vaccination status reviewed: last tetanus booster within 10 years. - Influenza: Administered today - Pneumovax: Up to date - Prevnar: Up to date - Zostavax vaccine: Refused  SCREENING: - Colonoscopy: Not applicable  Discussed with patient purpose of the colonoscopy is to detect colon cancer at curable precancerous or early stages   PATIENT COUNSELING:    Sexuality: Discussed sexually transmitted diseases, partner selection, use of condoms, avoidance of unintended pregnancy  and contraceptive alternatives.   Advised to avoid cigarette smoking.  I discussed with the patient that most people either  abstain from alcohol or drink within safe limits (<=14/week and <=4 drinks/occasion for males, <=7/weeks and <= 3 drinks/occasion for females) and that the risk for alcohol disorders and other health effects rises proportionally with the number of drinks per week and how often a drinker exceeds daily limits.  Discussed cessation/primary prevention of drug use and availability of treatment for abuse.   Diet: Encouraged to adjust caloric intake to maintain  or achieve ideal body weight, to reduce intake of dietary saturated fat and total fat, to limit sodium intake by avoiding high sodium foods and not adding table salt, and to maintain adequate dietary potassium and calcium  preferably from fresh fruits, vegetables, and low-fat dairy products.    stressed the importance of regular exercise  Injury prevention: Discussed safety belts, safety helmets, smoke detector, smoking near bedding or upholstery.   Dental health: Discussed importance of regular tooth brushing, flossing, and dental visits.   Follow up plan: NEXT PREVENTATIVE PHYSICAL DUE IN 1 YEAR. Return in about 3 months (around 12/20/2024).

## 2024-09-19 NOTE — Progress Notes (Signed)
 Chief Complaint  Patient presents with   Medicare Wellness     Subjective:   Ronnie Bullock is a 78 y.o. male who presents for a Medicare Annual Wellness Visit.  Allergies (verified) Patient has no known allergies.   History: Past Medical History:  Diagnosis Date   Acute CVA (cerebrovascular accident) (HCC) 03/22/2024   Benign neoplasm of ascending colon    Benign neoplasm of cecum    Cancer (HCC)    Chronic anticoagulation 03/21/2024   Coronary artery disease    Demand ischemia (HCC) 02/12/2021   Dementia (HCC)    Dementia (HCC)    Dementia without behavioral disturbance (HCC) 01/21/2021   DVT (deep venous thrombosis) (HCC) 01/20/2021   Encysted hydrocele 03/28/2019   Hyperlipidemia    Hypertension    Incontinence    Seasonal allergic rhinitis due to pollen 10/22/2021   Past Surgical History:  Procedure Laterality Date   COLONOSCOPY WITH PROPOFOL  N/A 05/16/2018   Procedure: COLONOSCOPY WITH PROPOFOL ;  Surgeon: Janalyn Keene NOVAK, MD;  Location: [facility];  Service: Endoscopy;  Laterality: N/A;   INGUINAL HERNIA REPAIR Right 08/09/2024   Procedure: REPAIR, HERNIA, INGUINAL, ADULT;  Surgeon: Marinda Jayson KIDD, MD;  Location: [facility];  Service: General;  Laterality: Right;  open procedure with mesh   INSERTION OF MESH Right 08/09/2024   Procedure: INSERTION OF MESH;  Surgeon: Marinda Jayson KIDD, MD;  Location: [facility];  Service: General;  Laterality: Right;   PERIPHERAL VASCULAR THROMBECTOMY Left 01/22/2021   Procedure: PERIPHERAL VASCULAR THROMBECTOMY / THROMBOLYSIS;  Surgeon: Jama Cordella MATSU, MD;  Location: [facility];  Service: Cardiovascular;  Laterality: Left;   Family History  Problem Relation Age of Onset   Hypertension Mother    Prostate cancer Neg Hx    Bladder Cancer Neg Hx    Kidney cancer Neg Hx    Social History   Occupational History   Not on file  Tobacco Use   Smoking status: Never    Passive exposure: Never   Smokeless  tobacco: Never  Vaping Use   Vaping status: Never Used  Substance and Sexual Activity   Alcohol use: No    Alcohol/week: 0.0 standard drinks of alcohol   Drug use: No   Sexual activity: Yes   Tobacco Counseling Counseling given: Not Answered  SDOH Screenings   Food Insecurity: Food Insecurity Present (05/30/2024)  Housing: Low Risk  (05/29/2024)  Transportation Needs: No Transportation Needs (03/22/2024)  Utilities: At Risk (05/30/2024)  Alcohol Screen: Low Risk  (08/02/2022)  Depression (PHQ2-9): Low Risk  (12/13/2023)  Financial Resource Strain: High Risk (05/30/2024)  Physical Activity: Inactive (08/02/2022)  Social Connections: Moderately Integrated (03/22/2024)  Stress: No Stress Concern Present (08/02/2022)  Tobacco Use: Low Risk  (09/19/2024)  Health Literacy: Adequate Health Literacy (06/28/2023)   See flowsheets for full screening details  Depression Screen PHQ 2 & 9 Depression Scale- Over the past 2 weeks, how often have you been bothered by any of the following problems? Little interest or pleasure in doing things: 0 Feeling down, depressed, or hopeless (PHQ Adolescent also includes...irritable): 0 PHQ-2 Total Score: 0 Trouble falling or staying asleep, or sleeping too much: 0 Feeling tired or having little energy: 0 Poor appetite or overeating (PHQ Adolescent also includes...weight loss): 0 Feeling bad about yourself - or that you are a failure or have let yourself or your family down: 0 Trouble concentrating on things, such as reading the newspaper or watching television (PHQ Adolescent also includes...like school  work): 0 Moving or speaking so slowly that other people could have noticed. Or the opposite - being so fidgety or restless that you have been moving around a lot more than usual: 0 Thoughts that you would be better off dead, or of hurting yourself in some way: 0 PHQ-9 Total Score: 0     Goals Addressed   None    Visit info / Clinical Intake: Medicare  Wellness Visit Type:: Welcome to Medicare (IPPE) Persons participating in visit:: patient & caregiver Medicare Wellness Visit Mode:: In-person (required for WTM) Information given by:: patient Interpreter Needed?: No Pre-visit prep was completed: no AWV questionnaire completed by patient prior to visit?: no Living arrangements:: with family/others (lives with Brother, Theodoro) Patient's Overall Health Status Rating: good Typical amount of pain: none Does pain affect daily life?: no Are you currently prescribed opioids?: no  Dietary Habits and Nutritional Risks How many meals a day?: 3 Eats fruit and vegetables daily?: yes Most meals are obtained by: having others provide food In the last 2 weeks, have you had any of the following?: none Diabetic:: no  Functional Status Activities of Daily Living (to include ambulation/medication): (!) Needs Assist Feeding: Independent Dressing/Grooming: Independent Bathing: Independent Toileting: Independent Transfer: Independent with device- listed below Ambulation: Independent Home Assistive Devices/Equipment: Walker (specify Type) Medication Administration: Needs assistance (comment) (Brother helps) Is this a change from baseline?: Pre-admission baseline Home Management: Needs assistance (comment) Manage your own finances?: (!) no Primary transportation is: family/friends (brother) Concerns about vision?: no *vision screening is required for WTM* Concerns about hearing?: no  Fall Screening Falls in the past year?: 1 Number of falls in past year: 0 Was there an injury with Fall?: 0 Fall Risk Category Calculator: 1 Patient Fall Risk Level: Low Fall Risk  Fall Risk Patient at Risk for Falls Due to: Impaired balance/gait; Impaired mobility; Other (Comment) (Uses walker) Fall risk Follow up: Falls evaluation completed  Home and Transportation Safety: All rugs have non-skid backing?: yes All stairs or steps have railings?: (!) no Grab  bars in the bathtub or shower?: yes Have non-skid surface in bathtub or shower?: yes Good home lighting?: yes Regular seat belt use?: yes Hospital stays in the last year:: no  Cognitive Assessment Difficulty concentrating, remembering, or making decisions? : no Will 6CIT or Mini Cog be Completed: no 6CIT or Mini Cog Declined: patient alert, oriented, able to answer questions appropriately and recall recent events  Advance Directives (For Healthcare) Does Patient Have a Medical Advance Directive?: -- (Patient unsure) Would patient like information on creating a medical advance directive?: No - Patient declined  Reviewed/Updated  Reviewed/Updated: Reviewed All (Medical, Surgical, Family, Medications, Allergies, Care Teams, Patient Goals)        Objective:    Today's Vitals   09/19/24 0838 09/19/24 0848  BP: (!) 159/94 138/82  Pulse: 96 93  Temp:  97.9 F (36.6 C)  TempSrc:  Oral  SpO2:  94%  Weight: 161 lb 6.4 oz (73.2 kg) 161 lb 6.4 oz (73.2 kg)  Height: 5' 9 (1.753 m) 5' 9 (1.753 m)  PainSc:  0-No pain   Body mass index is 23.83 kg/m.  Current Medications (verified) Outpatient Encounter Medications as of 09/19/2024  Medication Sig   acetaminophen  (TYLENOL ) 325 MG tablet Take 650 mg by mouth every 6 (six) hours as needed for moderate pain (pain score 4-6).   amLODipine  (NORVASC ) 5 MG tablet Take 1 tablet (5 mg total) by mouth daily.   apixaban  (ELIQUIS ) 5 MG  TABS tablet Take 1 tablet (5 mg total) by mouth 2 (two) times daily.   cetirizine  (ZYRTEC ) 10 MG tablet Take 1 tablet (10 mg total) by mouth daily.   donepezil  (ARICEPT ) 5 MG tablet Take 1 tablet (5 mg total) by mouth at bedtime.   fluticasone  (FLONASE ) 50 MCG/ACT nasal spray Place 2 sprays into both nostrils daily.   Multiple Vitamin (MULTIVITAMIN WITH MINERALS) TABS tablet Take 1 tablet by mouth daily.   mupirocin  cream (BACTROBAN ) 2 % Apply topically 2 (two) times daily. (Patient taking differently: Apply 1  Application topically 2 (two) times daily as needed (wound care).)   rosuvastatin (CRESTOR) 10 MG tablet Take 1 tablet (10 mg total) by mouth daily.   tamsulosin  (FLOMAX ) 0.4 MG CAPS capsule Take 2 capsules (0.8 mg total) by mouth daily.   triamcinolone  cream (KENALOG ) 0.1 % Apply 1 Application topically 2 (two) times daily.   [DISCONTINUED] amLODipine  (NORVASC ) 5 MG tablet TAKE 1 TABLET BY MOUTH ONCE DAILY   [DISCONTINUED] apixaban  (ELIQUIS ) 5 MG TABS tablet Take 1 tablet (5 mg total) by mouth 2 (two) times daily.   [DISCONTINUED] donepezil  (ARICEPT ) 5 MG tablet TAKE 1 TABLET BY MOUTH AT BEDTIME   [DISCONTINUED] tamsulosin  (FLOMAX ) 0.4 MG CAPS capsule Take 2 capsules (0.8 mg total) by mouth daily.   No facility-administered encounter medications on file as of 09/19/2024.   Hearing/Vision screen No results found. Immunizations and Health Maintenance Health Maintenance  Topic Date Due   Zoster Vaccines- Shingrix (1 of 2) Never done   Medicare Annual Wellness (AWV)  08/08/2024   COVID-19 Vaccine (4 - Mixed Product risk 2025-26 season) 03/19/2025   DTaP/Tdap/Td (2 - Tdap) 01/01/2029   Pneumococcal Vaccine: 50+ Years  Completed   Influenza Vaccine  Completed   Hepatitis C Screening  Completed   Meningococcal B Vaccine  Aged Out   Colonoscopy  Discontinued        Assessment/Plan:  This is a routine wellness examination for [place].  Patient Care Team: Vicci Duwaine SQUIBB, DO as PCP - General (Family Medicine) Twylla, Glendia BROCKS, MD (Urology) Ezzard Rolin BIRCH, LCSW as Social Worker (Licensed Clinical Social Worker) Ermalinda, [place], LCSW as Biomedical Scientist, Santana LABOR, RN as Designer, Jewellery  I have personally reviewed and noted the following in the patient's chart:   Medical and social history Use of alcohol, tobacco or illicit drugs  Current medications and supplements including opioid prescriptions. Functional ability and status Nutritional status Physical activity Advanced  directives List of other physicians Hospitalizations, surgeries, and ER visits in previous 12 months Vitals Screenings to include cognitive, depression, and falls Referrals and appointments  Orders Placed This Encounter  Procedures   Flu vaccine HIGH DOSE PF(Fluzone Trivalent)   Pfizer Comirnaty Covid -19 Vaccine 79yrs and older   Comprehensive metabolic panel with GFR    Has the patient fasted?:   Yes   CBC with Differential/Platelet   Lipid Panel w/o Chol/HDL Ratio    Has the patient fasted?:   Yes   PSA   TSH   Microalbumin, Urine Waived   In addition, I have reviewed and discussed with patient certain preventive protocols, quality metrics, and best practice recommendations. A written personalized care plan for preventive services as well as general preventive health recommendations were provided to patient.   Duwaine Vicci, DO   09/23/2024   Return in about 3 months (around 12/20/2024).  After Visit Summary: (In Person-Printed) AVS printed and given to the patient

## 2024-09-20 LAB — LIPID PANEL W/O CHOL/HDL RATIO
Cholesterol, Total: 163 mg/dL (ref 100–199)
HDL: 55 mg/dL (ref >39)
LDL Chol Calc (NIH): 92 mg/dL (ref 0–99)
Triglycerides: 85 mg/dL (ref 0–149)
VLDL Cholesterol Cal: 16 mg/dL (ref 5–40)

## 2024-09-20 LAB — CBC WITH DIFFERENTIAL/PLATELET
Basophils Absolute: 0 x10E3/uL (ref 0.0–0.2)
Basos: 1 %
EOS (ABSOLUTE): 0.5 x10E3/uL — ABNORMAL HIGH (ref 0.0–0.4)
Eos: 12 %
Hematocrit: 44 % (ref 37.5–51.0)
Hemoglobin: 14.1 g/dL (ref 13.0–17.7)
Immature Grans (Abs): 0 x10E3/uL (ref 0.0–0.1)
Immature Granulocytes: 0 %
Lymphocytes Absolute: 1.4 x10E3/uL (ref 0.7–3.1)
Lymphs: 30 %
MCH: 29.7 pg (ref 26.6–33.0)
MCHC: 32 g/dL (ref 31.5–35.7)
MCV: 93 fL (ref 79–97)
Monocytes Absolute: 0.7 x10E3/uL (ref 0.1–0.9)
Monocytes: 15 %
Neutrophils Absolute: 1.9 x10E3/uL (ref 1.4–7.0)
Neutrophils: 42 %
Platelets: 221 x10E3/uL (ref 150–450)
RBC: 4.75 x10E6/uL (ref 4.14–5.80)
RDW: 14 % (ref 11.6–15.4)
WBC: 4.5 x10E3/uL (ref 3.4–10.8)

## 2024-09-20 LAB — COMPREHENSIVE METABOLIC PANEL WITH GFR
ALT: 9 IU/L (ref 0–44)
AST: 16 IU/L (ref 0–40)
Albumin: 3.4 g/dL — ABNORMAL LOW (ref 3.8–4.8)
Alkaline Phosphatase: 102 IU/L (ref 47–123)
BUN/Creatinine Ratio: 11 (ref 10–24)
BUN: 15 mg/dL (ref 8–27)
Bilirubin Total: 0.2 mg/dL (ref 0.0–1.2)
CO2: 24 mmol/L (ref 20–29)
Calcium: 9.1 mg/dL (ref 8.6–10.2)
Chloride: 102 mmol/L (ref 96–106)
Creatinine, Ser: 1.33 mg/dL — ABNORMAL HIGH (ref 0.76–1.27)
Globulin, Total: 3.7 g/dL (ref 1.5–4.5)
Glucose: 74 mg/dL (ref 70–99)
Potassium: 4.4 mmol/L (ref 3.5–5.2)
Sodium: 140 mmol/L (ref 134–144)
Total Protein: 7.1 g/dL (ref 6.0–8.5)
eGFR: 54 mL/min/1.73 — ABNORMAL LOW (ref >59)

## 2024-09-20 LAB — TSH: TSH: 1.88 u[IU]/mL (ref 0.450–4.500)

## 2024-09-20 LAB — PSA: Prostate Specific Ag, Serum: 0.1 ng/mL (ref 0.0–4.0)

## 2024-09-23 ENCOUNTER — Encounter: Payer: Self-pay | Admitting: Family Medicine

## 2024-09-23 ENCOUNTER — Ambulatory Visit: Payer: Self-pay | Admitting: Family Medicine

## 2024-09-23 DIAGNOSIS — R7301 Impaired fasting glucose: Secondary | ICD-10-CM | POA: Insufficient documentation

## 2024-09-23 NOTE — Assessment & Plan Note (Signed)
Rechecking PSA today. Await results. Treat as needed.  

## 2024-09-23 NOTE — Assessment & Plan Note (Signed)
 Stable. Has help with his brother. Continue aricept . Continue to monitor. Call with any concerns. Refills given today.

## 2024-09-23 NOTE — Assessment & Plan Note (Signed)
 Will keep his BP and cholesterol under good control. Continue to monitor. Call with any concerns.

## 2024-09-23 NOTE — Assessment & Plan Note (Signed)
 Under good control on current regimen. Continue current regimen. Continue to monitor. Call with any concerns. Refills given. Labs drawn today.

## 2024-11-15 ENCOUNTER — Encounter: Payer: Self-pay | Admitting: Internal Medicine

## 2024-11-15 ENCOUNTER — Ambulatory Visit (INDEPENDENT_AMBULATORY_CARE_PROVIDER_SITE_OTHER): Admitting: Internal Medicine

## 2024-11-15 VITALS — BP 140/80 | HR 95 | Temp 97.7°F | Ht 69.0 in | Wt 166.2 lb

## 2024-11-15 DIAGNOSIS — J3089 Other allergic rhinitis: Secondary | ICD-10-CM | POA: Diagnosis not present

## 2024-11-15 LAB — POCT INFLUENZA A/B
Influenza A, POC: NEGATIVE
Influenza B, POC: NEGATIVE

## 2024-11-15 LAB — POC COVID19 BINAXNOW: SARS Coronavirus 2 Ag: NEGATIVE

## 2024-11-15 MED ORDER — LEVOCETIRIZINE DIHYDROCHLORIDE 5 MG PO TABS: 5.0000 mg | ORAL_TABLET | Freq: Every evening | ORAL | 0 refills | Status: AC

## 2024-11-15 MED ORDER — AZELASTINE HCL 0.1 % NA SOLN: 1.0000 | Freq: Two times a day (BID) | NASAL | 0 refills | Status: AC

## 2024-11-15 NOTE — Patient Instructions (Signed)
 Allergic Rhinitis, Adult  Allergic rhinitis is a reaction to allergens. Allergens are things that can cause an allergic reaction. This condition affects the lining inside the nose (mucous membrane). There are two types of allergic rhinitis: Seasonal. This type is also called hay fever. It happens only during some times of the year. Perennial. This type can happen at any time of the year. This condition cannot be spread from person to person (is not contagious). It can be mild, bad, or very bad. It can develop at any age and may be outgrown. What are the causes? Pollen from grasses, trees, and weeds. Other causes can be: Dust mites. Smoke. Mold. Car fumes. The pee (urine), spit, or dander of pets. Dander is dead skin cells from a pet. What increases the risk? You are more likely to develop this condition if: You have allergies in your family. You have problems like allergies in your family. You may have: Swelling of parts of your eyes and eyelids. Asthma. This affects how you breathe. Long-term redness and swelling on your skin. Food allergies. What are the signs or symptoms? The main symptom of this condition is a runny or stuffy nose (nasal congestion). Other symptoms may include: Sneezing or coughing. Itching and tearing of your eyes. Mucus that drips down the back of your throat (postnasal drip). This may cause a sore throat. Trouble sleeping. Feeling tired. Headache. How is this treated? There is no cure for this condition. You should avoid things that you are allergic to. Treatment can help to relieve symptoms. This may include: Medicines that block allergy symptoms, such as anti-inflammatories or antihistamines. These may be given as a shot, nasal spray, or pill. Avoiding things you are allergic to. Medicines that give you some of what you are allergic to over time. This is called immunotherapy. It is done if other treatments do not help. You may get: Shots. Medicine under  your tongue. Stronger medicines, if other treatments do not help. Follow these instructions at home: Avoiding allergens Find out what things you are allergic to and avoid them. To do this, try these things: If you get allergies any time of year: Replace carpet with wood, tile, or vinyl flooring. Carpet can trap pet dander and dust. Do not smoke. Do not allow smoking in your home. Change your heating and air conditioning filters at least once a month. If you get allergies only some times of the year: Keep windows closed when you can. Plan things to do outside when pollen counts are lowest. Check pollen counts before you plan things to do outside. When you come indoors, change your clothes and shower before you sit on furniture or bedding. If you are allergic to a pet: Keep the pet out of your bedroom. Vacuum, sweep, and dust often. General instructions Take over-the-counter and prescription medicines only as told by your doctor. Drink enough fluid to keep your pee pale yellow. Where to find more information American Academy of Allergy, Asthma & Immunology: aaaai.org Contact a doctor if: You have a fever. You get a cough that does not go away. You make high-pitched whistling sounds when you breathe, most often when you breathe out (wheeze). Your symptoms slow you down. Your symptoms stop you from doing your normal things each day. Get help right away if: You are short of breath. This symptom may be an emergency. Get help right away. Call 911. Do not wait to see if the symptom will go away. Do not drive yourself to the  hospital. This information is not intended to replace advice given to you by your health care provider. Make sure you discuss any questions you have with your health care provider. Document Revised: 07/04/2022 Document Reviewed: 07/04/2022 Elsevier Patient Education  2024 [employer].

## 2024-11-15 NOTE — Progress Notes (Signed)
 "  Subjective:    Patient ID: Ronnie Bullock, male    DOB: August 26, 1946, 79 y.o.   MRN: [id number]  HPI  Discussed the use of AI scribe software for clinical note transcription with the patient, who gave verbal consent to proceed.  Ronnie Bullock is a 79 year old male who presents with right nose, nasal congestion, postnasal drip and cough..  He has been experiencing nasal congestion and a burning sensation in his nose since yesterday, which began [facility]. His sister was informed by the center that he was not eating, prompting his return home. He has a history of allergies and has been using Zyrtec  and Flonase  daily for management.  No fever, nausea, vomiting, diarrhea, ear pain, headache, sore throat, or significant cough, although he mentions occasional coughing due to congestion. After returning home, he ate well, consuming a turkey sandwich for lunch and a full dinner, including pie.  He has been using Flonase  for approximately two years.  He has had ongoing issues with allergies for the last 2 years.  He has not had sick contacts that he is aware of.      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     amLODipine  (NORVASC ) 5 MG  tablet Take 1 tablet (5 mg total) by mouth daily. 90 tablet 1   apixaban  (ELIQUIS ) 5 MG TABS tablet Take 1 tablet (5 mg total) by mouth 2 (two) times daily. 180 tablet 1   cetirizine  (ZYRTEC ) 10 MG tablet Take 1 tablet (10 mg total) by mouth daily. 30 tablet 11   donepezil  (ARICEPT ) 5 MG tablet Take 1 tablet (5 mg total) by mouth at bedtime. 90 tablet 0   fluticasone  (FLONASE ) 50 MCG/ACT nasal spray Place 2 sprays into both nostrils daily.     Multiple Vitamin (MULTIVITAMIN WITH MINERALS) TABS tablet Take 1 tablet by mouth daily. 90 tablet 0   mupirocin  cream (BACTROBAN ) 2 % Apply topically 2 (two) times daily. (Patient taking differently: Apply 1 Application topically 2 (two) times daily as needed (wound care).) 15 g 0   rosuvastatin  (CRESTOR ) 10 MG tablet Take 1 tablet (10 mg total) by mouth daily. 90 tablet 1   tamsulosin  (FLOMAX ) 0.4 MG CAPS capsule Take 2 capsules (0.8 mg total) by mouth daily. 180 capsule 1   triamcinolone  cream (KENALOG ) 0.1 % Apply 1 Application topically 2 (two) times daily. 30 g 0   No current facility-administered medications for this visit.    Allergies[1]  Family History  Problem Relation Age of Onset   Hypertension Mother    Prostate cancer Neg Hx    Bladder Cancer Neg Hx    Kidney cancer Neg  Hx     Social History   Socioeconomic History   Marital status: Widowed    Spouse name: Not on file   Number of children: Not on file   Years of education: Not on file   Highest education level: 12th grade  Occupational History   Not on file  Tobacco Use   Smoking status: Never    Passive exposure: Never   Smokeless tobacco: Never  Vaping Use   Vaping status: Never Used  Substance and Sexual Activity   Alcohol use: No    Alcohol/week: 0.0 standard drinks of alcohol   Drug use: No   Sexual activity: Yes  Other Topics Concern   Not on file  Social History Narrative   Not on file   Social Drivers of Health   Tobacco Use: Low Risk (09/23/2024)    Patient History    Smoking Tobacco Use: Never    Smokeless Tobacco Use: Never    Passive Exposure: Never  Financial Resource Strain: High Risk (05/30/2024)   Overall Financial Resource Strain (CARDIA)    Difficulty of Paying Living Expenses: Very hard  Food Insecurity: Food Insecurity Present (05/30/2024)   Epic    Worried About Programme Researcher, Broadcasting/film/video in the Last Year: Often true    Ran Out of Food in the Last Year: Often true  Transportation Needs: No Transportation Needs (03/22/2024)   PRAPARE - Administrator, Civil Service (Medical): No    Lack of Transportation (Non-Medical): No  Physical Activity: Inactive (08/02/2022)   Exercise Vital Sign    Days of Exercise per Week: 0 days    Minutes of Exercise per Session: 0 min  Stress: No Stress Concern Present (08/02/2022)   [employer] of Occupational Health - Occupational Stress Questionnaire    Feeling of Stress : Only a little  Social Connections: Moderately Integrated (03/22/2024)   Social Connection and Isolation Panel    Frequency of Communication with Friends and Family: Never    Frequency of Social Gatherings with Friends and Family: More than three times a week    Attends Religious Services: More than 4 times per year    Active Member of [employer] or Organizations: Yes    Attends Banker Meetings: Never    Marital Status: Widowed  Intimate Partner Violence: Not At Risk (05/29/2024)   Epic    Fear of Current or Ex-Partner: No    Emotionally Abused: No    Physically Abused: No    Sexually Abused: No  Depression (PHQ2-9): Low Risk (12/13/2023)   Depression (PHQ2-9)    PHQ-2 Score: 0  Alcohol Screen: Low Risk (08/02/2022)   Alcohol Screen    Last Alcohol Screening Score (AUDIT): 0  Housing: Low Risk (05/29/2024)   Epic    Unable to Pay for Housing in the Last Year: No    Number of Times Moved in the Last Year: 0    Homeless in the Last Year: No  Utilities: At Risk (05/30/2024)   Epic    Threatened with  loss of utilities: Yes  Health Literacy: Adequate Health Literacy (06/28/2023)   B1300 Health Literacy    Frequency of need for help with medical instructions: Rarely     Constitutional: Denies fever, malaise, fatigue, headache or abrupt weight changes.  HEENT: Patient reports nasal congestion.  Denies eye pain, eye redness, ear pain, ringing in the ears, wax buildup, runny nose, bloody nose, or sore throat. Respiratory: Patient reports cough.  Denies difficulty breathing,  shortness of breath, or sputum production.   Cardiovascular: Denies chest pain, chest tightness, palpitations or swelling in the hands or feet.  Gastrointestinal: Patient reports poor appetite.  Denies abdominal pain, bloating, constipation, diarrhea or blood in the stool.  GU: Denies urgency, frequency, pain with urination, burning sensation, blood in urine, odor or discharge.  No other specific complaints in a complete review of systems (except as listed in HPI above).      Objective:   Physical Exam  BP (!) 140/80   Pulse 95   Temp 97.7 F (36.5 C)   Ht 5' 9 (1.753 m)   Wt 166 lb 3.2 oz (75.4 kg)   SpO2 96%   BMI 24.54 kg/m   Wt Readings from Last 3 Encounters:  09/19/24 161 lb 6.4 oz (73.2 kg)  08/22/24 157 lb (71.2 kg)  08/09/24 155 lb (70.3 kg)    General: Appears his stated age, chronically ill-appearing, in NAD. Skin: Warm, dry and intact.  HEENT: Head: normal shape and size, no sinus tenderness noted; Eyes: sclera white, no icterus, conjunctiva pink, PERRLA and EOMs intact; Nose: mucosa he and moist, turbinates swollen, septum midline; Throat/Mouth: Dentures present, mucosa pink and moist, + PND, no exudate, lesions or ulcerations noted.  Neck: No adenopathy noted. Cardiovascular: Normal rate and rhythm. S1,S2 noted.  No murmur, rubs or gallops noted.  Pulmonary/Chest: Normal effort and positive vesicular breath sounds. No respiratory distress. No wheezes, rales or ronchi noted.  Musculoskeletal:  Gait slow and steady with use of traditional walker. Neurological: Alert and oriented.  Able to respond mainly with yes and no answers.  BMET    Component Value Date/Time   NA 140 09/19/2024 0851   K 4.4 09/19/2024 0851   CL 102 09/19/2024 0851   CO2 24 09/19/2024 0851   GLUCOSE 74 09/19/2024 0851   GLUCOSE 98 08/09/2024 0643   BUN 15 09/19/2024 0851   CREATININE 1.33 (H) 09/19/2024 0851   CALCIUM  9.1 09/19/2024 0851   GFRNONAA 51 (L) 08/09/2024 0643   GFRAA 84 09/10/2020 1441    Lipid Panel     Component Value Date/Time   CHOL 163 09/19/2024 0851   TRIG 85 09/19/2024 0851   HDL 55 09/19/2024 0851   CHOLHDL 2.7 03/22/2024 0445   VLDL 8 03/22/2024 0445   LDLCALC 92 09/19/2024 0851    CBC    Component Value Date/Time   WBC 4.5 09/19/2024 0851   WBC 4.8 08/09/2024 0643   RBC 4.75 09/19/2024 0851   RBC 4.85 08/09/2024 0643   HGB 14.1 09/19/2024 0851   HCT 44.0 09/19/2024 0851   PLT 221 09/19/2024 0851   MCV 93 09/19/2024 0851   MCH 29.7 09/19/2024 0851   MCH 29.3 08/09/2024 0643   MCHC 32.0 09/19/2024 0851   MCHC 32.9 08/09/2024 0643   RDW 14.0 09/19/2024 0851   LYMPHSABS 1.4 09/19/2024 0851   MONOABS 0.6 03/21/2024 1651   EOSABS 0.5 (H) 09/19/2024 0851   BASOSABS 0.0 09/19/2024 0851    Hgb A1C Lab Results  Component Value Date   HGBA1C 6.3 (H) 03/21/2024            Assessment & Plan:   Assessment and Plan    Allergic rhinitis Chronic allergic rhinitis with nasal congestion and post-nasal drip causing cough. Current treatment ineffective.  COVID and flu testing today negative. - Discontinued Flonase  and Zyrtec  due to ineffectiveness. - Prescribed levocetirizine 5 mg once daily at bedtime. - Prescribed Astelin  nasal spray, 1 spray  twice daily. - Follow-up with your PCP if symptoms persist or worsen      Follow-up with your PCP as previously scheduled Angeline Laura, NP      [1] No Known Allergies  "

## 2024-12-20 ENCOUNTER — Ambulatory Visit: Admitting: Family Medicine
